# Patient Record
Sex: Female | Born: 1940
Health system: Southern US, Community
[De-identification: ages and names within clinical notes are randomized; demographics above are authoritative.]

## PROBLEM LIST (undated history)

## (undated) DIAGNOSIS — E0590 Thyrotoxicosis, unspecified without thyrotoxic crisis or storm: Secondary | ICD-10-CM

## (undated) DIAGNOSIS — F32A Depression, unspecified: Secondary | ICD-10-CM

## (undated) DIAGNOSIS — I10 Essential (primary) hypertension: Secondary | ICD-10-CM

## (undated) DIAGNOSIS — I639 Cerebral infarction, unspecified: Secondary | ICD-10-CM

## (undated) DIAGNOSIS — F329 Major depressive disorder, single episode, unspecified: Secondary | ICD-10-CM

## (undated) DIAGNOSIS — E119 Type 2 diabetes mellitus without complications: Secondary | ICD-10-CM

## (undated) DIAGNOSIS — K219 Gastro-esophageal reflux disease without esophagitis: Secondary | ICD-10-CM

## (undated) DIAGNOSIS — D649 Anemia, unspecified: Secondary | ICD-10-CM

## (undated) DIAGNOSIS — H269 Unspecified cataract: Secondary | ICD-10-CM

## (undated) DIAGNOSIS — N189 Chronic kidney disease, unspecified: Secondary | ICD-10-CM

## (undated) DIAGNOSIS — J189 Pneumonia, unspecified organism: Secondary | ICD-10-CM

## (undated) HISTORY — PX: CARPAL TUNNEL RELEASE: SHX101

## (undated) HISTORY — DX: Chronic kidney disease, unspecified: N18.9

## (undated) HISTORY — PX: ABDOMINAL HYSTERECTOMY: SHX81

---

## 2000-06-04 ENCOUNTER — Encounter: Payer: Self-pay | Admitting: Family Medicine

## 2000-06-04 ENCOUNTER — Encounter: Admission: RE | Admit: 2000-06-04 | Discharge: 2000-06-04 | Payer: Self-pay | Admitting: Family Medicine

## 2001-08-16 ENCOUNTER — Encounter: Admission: RE | Admit: 2001-08-16 | Discharge: 2001-08-16 | Payer: Self-pay | Admitting: Family Medicine

## 2001-08-16 ENCOUNTER — Encounter: Payer: Self-pay | Admitting: Family Medicine

## 2003-11-18 ENCOUNTER — Encounter: Admission: RE | Admit: 2003-11-18 | Discharge: 2003-11-18 | Payer: Self-pay | Admitting: Family Medicine

## 2004-10-17 ENCOUNTER — Other Ambulatory Visit: Admission: RE | Admit: 2004-10-17 | Discharge: 2004-10-17 | Payer: Self-pay | Admitting: Gynecology

## 2006-04-13 ENCOUNTER — Ambulatory Visit: Payer: Self-pay | Admitting: Internal Medicine

## 2006-04-26 ENCOUNTER — Ambulatory Visit: Payer: Self-pay | Admitting: Internal Medicine

## 2006-05-30 ENCOUNTER — Ambulatory Visit: Payer: Self-pay | Admitting: Internal Medicine

## 2007-07-11 ENCOUNTER — Encounter: Admission: RE | Admit: 2007-07-11 | Discharge: 2007-07-11 | Payer: Self-pay | Admitting: Family Medicine

## 2010-01-24 ENCOUNTER — Telehealth (INDEPENDENT_AMBULATORY_CARE_PROVIDER_SITE_OTHER): Payer: Self-pay | Admitting: *Deleted

## 2011-01-26 NOTE — Progress Notes (Signed)
  Phone Note Other Incoming   Summary of Call: Patient's chart has been requested by patient to Lavone Orn. Forwarded to [employer] on 01/24/10

## 2012-07-10 ENCOUNTER — Other Ambulatory Visit: Payer: Self-pay | Admitting: Internal Medicine

## 2012-07-10 DIAGNOSIS — Z1231 Encounter for screening mammogram for malignant neoplasm of breast: Secondary | ICD-10-CM

## 2012-09-09 ENCOUNTER — Ambulatory Visit
Admission: RE | Admit: 2012-09-09 | Discharge: 2012-09-09 | Disposition: A | Discharge status: Home / Self Care | Payer: Medicare Other | Source: Ambulatory Visit | Attending: Internal Medicine | Admitting: Internal Medicine

## 2012-09-09 DIAGNOSIS — Z1231 Encounter for screening mammogram for malignant neoplasm of breast: Secondary | ICD-10-CM

## 2013-07-30 ENCOUNTER — Other Ambulatory Visit: Payer: Self-pay

## 2013-07-30 DIAGNOSIS — Z1231 Encounter for screening mammogram for malignant neoplasm of breast: Secondary | ICD-10-CM

## 2013-09-11 ENCOUNTER — Ambulatory Visit
Admission: RE | Admit: 2013-09-11 | Discharge: 2013-09-11 | Disposition: A | Discharge status: Home / Self Care | Payer: Medicare Other | Source: Ambulatory Visit

## 2013-09-11 DIAGNOSIS — Z1231 Encounter for screening mammogram for malignant neoplasm of breast: Secondary | ICD-10-CM

## 2014-10-14 ENCOUNTER — Other Ambulatory Visit: Payer: Self-pay | Admitting: Gastroenterology

## 2014-11-02 ENCOUNTER — Encounter (HOSPITAL_COMMUNITY): Payer: Self-pay | Admitting: *Deleted

## 2014-11-03 ENCOUNTER — Other Ambulatory Visit: Payer: Self-pay | Admitting: Gastroenterology

## 2014-11-17 ENCOUNTER — Encounter (HOSPITAL_COMMUNITY): Payer: Self-pay | Admitting: *Deleted

## 2014-11-17 ENCOUNTER — Ambulatory Visit (HOSPITAL_COMMUNITY): Payer: Medicare Other | Admitting: Anesthesiology

## 2014-11-17 ENCOUNTER — Ambulatory Visit (HOSPITAL_COMMUNITY)
Admission: RE | Admit: 2014-11-17 | Discharge: 2014-11-17 | Disposition: A | Discharge status: Home / Self Care | Payer: Medicare Other | Source: Ambulatory Visit | Attending: Gastroenterology | Admitting: Gastroenterology

## 2014-11-17 ENCOUNTER — Encounter (HOSPITAL_COMMUNITY)
Admission: RE | Disposition: A | Discharge status: Home / Self Care | Payer: Self-pay | Source: Ambulatory Visit | Attending: Gastroenterology

## 2014-11-17 DIAGNOSIS — Z1211 Encounter for screening for malignant neoplasm of colon: Secondary | ICD-10-CM | POA: Insufficient documentation

## 2014-11-17 DIAGNOSIS — K5730 Diverticulosis of large intestine without perforation or abscess without bleeding: Secondary | ICD-10-CM | POA: Insufficient documentation

## 2014-11-17 DIAGNOSIS — K219 Gastro-esophageal reflux disease without esophagitis: Secondary | ICD-10-CM | POA: Insufficient documentation

## 2014-11-17 DIAGNOSIS — I10 Essential (primary) hypertension: Secondary | ICD-10-CM | POA: Insufficient documentation

## 2014-11-17 DIAGNOSIS — E1140 Type 2 diabetes mellitus with diabetic neuropathy, unspecified: Secondary | ICD-10-CM | POA: Diagnosis not present

## 2014-11-17 DIAGNOSIS — E780 Pure hypercholesterolemia: Secondary | ICD-10-CM | POA: Insufficient documentation

## 2014-11-17 DIAGNOSIS — F17200 Nicotine dependence, unspecified, uncomplicated: Secondary | ICD-10-CM | POA: Diagnosis not present

## 2014-11-17 DIAGNOSIS — E0590 Thyrotoxicosis, unspecified without thyrotoxic crisis or storm: Secondary | ICD-10-CM | POA: Insufficient documentation

## 2014-11-17 DIAGNOSIS — M8580 Other specified disorders of bone density and structure, unspecified site: Secondary | ICD-10-CM | POA: Diagnosis not present

## 2014-11-17 DIAGNOSIS — E162 Hypoglycemia, unspecified: Secondary | ICD-10-CM | POA: Insufficient documentation

## 2014-11-17 DIAGNOSIS — K635 Polyp of colon: Secondary | ICD-10-CM | POA: Insufficient documentation

## 2014-11-17 HISTORY — DX: Essential (primary) hypertension: I10

## 2014-11-17 HISTORY — DX: Depression, unspecified: F32.A

## 2014-11-17 HISTORY — PX: COLONOSCOPY WITH PROPOFOL: SHX5780

## 2014-11-17 HISTORY — DX: Type 2 diabetes mellitus without complications: E11.9

## 2014-11-17 HISTORY — DX: Major depressive disorder, single episode, unspecified: F32.9

## 2014-11-17 HISTORY — DX: Unspecified cataract: H26.9

## 2014-11-17 LAB — GLUCOSE, CAPILLARY: Glucose-Capillary: 110 mg/dL — ABNORMAL HIGH (ref 70–99)

## 2014-11-17 SURGERY — COLONOSCOPY WITH PROPOFOL
Anesthesia: Monitor Anesthesia Care

## 2014-11-17 MED ORDER — PROPOFOL INFUSION 10 MG/ML OPTIME
INTRAVENOUS | Status: DC | PRN
  Administered 2014-11-17: 100 ug/kg/min via INTRAVENOUS

## 2014-11-17 MED ORDER — LACTATED RINGERS IV SOLN
INTRAVENOUS | Status: DC
  Administered 2014-11-17: 1000 mL via INTRAVENOUS

## 2014-11-17 MED ORDER — PROPOFOL 10 MG/ML IV BOLUS
INTRAVENOUS | Status: AC
  Filled 2014-11-17: qty 20

## 2014-11-17 MED ORDER — PROMETHAZINE HCL 25 MG/ML IJ SOLN: 6.2500 mg | INTRAMUSCULAR | Status: DC | PRN

## 2014-11-17 MED ORDER — SODIUM CHLORIDE 0.9 % IV SOLN: INTRAVENOUS | Status: DC

## 2014-11-17 SURGICAL SUPPLY — 22 items

## 2014-11-17 NOTE — Anesthesia Preprocedure Evaluation (Signed)
Anesthesia Evaluation  Patient identified by MRN, date of birth, ID band Patient awake    Reviewed: Allergy & Precautions, H&P , NPO status , Patient's Chart, lab work & pertinent test results  Airway Mallampati: II  TM Distance: >3 FB Neck ROM: Full    Dental no notable dental hx.    Pulmonary Current Smoker,  breath sounds clear to auscultation  Pulmonary exam normal       Cardiovascular hypertension, Pt. on medications and Pt. on home beta blockers Rhythm:Regular Rate:Normal     Neuro/Psych negative neurological ROS  negative psych ROS   GI/Hepatic negative GI ROS, Neg liver ROS,   Endo/Other  diabetes, Oral Hypoglycemic Agents  Renal/GU negative Renal ROS  negative genitourinary   Musculoskeletal negative musculoskeletal ROS (+)   Abdominal   Peds negative pediatric ROS (+)  Hematology negative hematology ROS (+)   Anesthesia Other Findings   Reproductive/Obstetrics negative OB ROS                             Anesthesia Physical Anesthesia Plan  ASA: III  Anesthesia Plan: MAC   Post-op Pain Management:    Induction: Intravenous  Airway Management Planned: Nasal Cannula  Additional Equipment:   Intra-op Plan:   Post-operative Plan:   Informed Consent: I have reviewed the patients History and Physical, chart, labs and discussed the procedure including the risks, benefits and alternatives for the proposed anesthesia with the patient or authorized representative who has indicated his/her understanding and acceptance.   Dental advisory given  Plan Discussed with: CRNA and Surgeon  Anesthesia Plan Comments:         Anesthesia Quick Evaluation

## 2014-11-17 NOTE — Op Note (Signed)
Procedure: Screening colonoscopy  Endoscopist: Earle Gell  Premedication: Propofol administered by anesthesia  Procedure: The patient was placed in the left lateral decubitus position. Anal inspection and digital rectal exam were normal. The Pentax pediatric colonoscope was introduced into the rectum and advanced to the cecum. A normal-appearing appendiceal orifice was identified. A normal-appearing ileocecal valve was intubated and the terminal ileum inspected. Colonic preparation for the exam today was good. Withdrawal time was 12 minutes.  Rectum. Normal. Retroflexed view of the distal rectum normal  Sigmoid colon and descending colon. Scattered small diverticula. From the distal sigmoid colon a 3 mm sessile polyp was removed with the cold biopsy forceps  Splenic flexure. Normal  Transverse colon. Normal  Hepatic flexure. Normal  Ascending colon. Normal  Cecum and ileocecal valve. Normal  Terminal ileum. Normal  Assessment: A diminutive polyp was removed from the distal sigmoid colon; otherwise normal colonoscopy.

## 2014-11-17 NOTE — H&P (Signed)
  Procedure: Screening colonoscopy  History: The patient is a 73 year old female born [birth year]. She is scheduled to undergo a repeat screening colonoscopy with polypectomy to prevent colon cancer.  Past medical history: Abdominal hysterectomy. Right carpal tunnel surgery. Type 2 diabetes mellitus. Diabetic neuropathy. Hypertension. Hypercholesterolemia. Osteopenia. Hyperthyroidism. Gastroesophageal reflux.  Medication allergies: None  Exam: The patient is alert and lying comfortably on the endoscopy stretcher. Lungs are clear to auscultation. Cardiac exam reveals a regular rhythm. Abdomen is soft and nontender to palpation.  Plan: Proceed with screening colonoscopy

## 2014-11-17 NOTE — Transfer of Care (Signed)
Immediate Anesthesia Transfer of Care Note  Patient: Monica Hunt  Procedure(s) Performed: Procedure(s): COLONOSCOPY WITH PROPOFOL (N/A)  Patient Location: PACU  Anesthesia Type:MAC  Level of Consciousness: sedated  Airway & Oxygen Therapy: Patient Spontanous Breathing and Patient connected to nasal cannula oxygen  Post-op Assessment: Report given to PACU RN and Post -op Vital signs reviewed and stable  Post vital signs: Reviewed and stable  Complications: No apparent anesthesia complications

## 2014-11-17 NOTE — Anesthesia Postprocedure Evaluation (Signed)
  Anesthesia Post-op Note  Patient: Monica Hunt  Procedure(s) Performed: Procedure(s) (LRB): COLONOSCOPY WITH PROPOFOL (N/A)  Patient Location: PACU  Anesthesia Type: MAC  Level of Consciousness: awake and alert   Airway and Oxygen Therapy: Patient Spontanous Breathing  Post-op Pain: mild  Post-op Assessment: Post-op Vital signs reviewed, Patient's Cardiovascular Status Stable, Respiratory Function Stable, Patent Airway and No signs of Nausea or vomiting  Last Vitals:  Filed Vitals:   11/17/14 1053  BP: 118/61  Pulse: 74  Temp: 36.7 C  Resp: 18    Post-op Vital Signs: stable   Complications: No apparent anesthesia complications

## 2014-11-18 ENCOUNTER — Encounter (HOSPITAL_COMMUNITY): Payer: Self-pay | Admitting: Gastroenterology

## 2015-02-23 ENCOUNTER — Other Ambulatory Visit: Payer: Self-pay

## 2015-02-23 DIAGNOSIS — Z1231 Encounter for screening mammogram for malignant neoplasm of breast: Secondary | ICD-10-CM

## 2015-03-22 ENCOUNTER — Ambulatory Visit
Admission: RE | Admit: 2015-03-22 | Discharge: 2015-03-22 | Disposition: A | Discharge status: Home / Self Care | Payer: Medicare Other | Source: Ambulatory Visit

## 2015-03-22 ENCOUNTER — Other Ambulatory Visit: Payer: Self-pay

## 2015-03-22 DIAGNOSIS — Z1231 Encounter for screening mammogram for malignant neoplasm of breast: Secondary | ICD-10-CM

## 2016-04-19 ENCOUNTER — Other Ambulatory Visit: Payer: Self-pay

## 2016-04-19 DIAGNOSIS — Z1231 Encounter for screening mammogram for malignant neoplasm of breast: Secondary | ICD-10-CM

## 2016-04-25 ENCOUNTER — Ambulatory Visit
Admission: RE | Admit: 2016-04-25 | Discharge: 2016-04-25 | Disposition: A | Discharge status: Home / Self Care | Payer: Medicare Other | Source: Ambulatory Visit

## 2016-04-25 DIAGNOSIS — Z1231 Encounter for screening mammogram for malignant neoplasm of breast: Secondary | ICD-10-CM

## 2016-05-01 ENCOUNTER — Other Ambulatory Visit: Payer: Self-pay | Admitting: Internal Medicine

## 2016-05-01 DIAGNOSIS — N183 Chronic kidney disease, stage 3 unspecified: Secondary | ICD-10-CM

## 2016-05-02 ENCOUNTER — Ambulatory Visit
Admission: RE | Admit: 2016-05-02 | Discharge: 2016-05-02 | Disposition: A | Discharge status: Home / Self Care | Payer: Medicare Other | Source: Ambulatory Visit | Attending: Internal Medicine | Admitting: Internal Medicine

## 2016-05-02 DIAGNOSIS — N183 Chronic kidney disease, stage 3 unspecified: Secondary | ICD-10-CM

## 2016-05-02 IMAGING — US US RENAL
1 series · 14 of 25 positions shown · non-contrast
Comparison: None.

CLINICAL DATA: Chronic kidney disease stage III

EXAM:
RENAL / URINARY TRACT ULTRASOUND COMPLETE

[Series 1: us renal · 0.26mm/px · 14 of 34 slices shown]
[im 1/34]
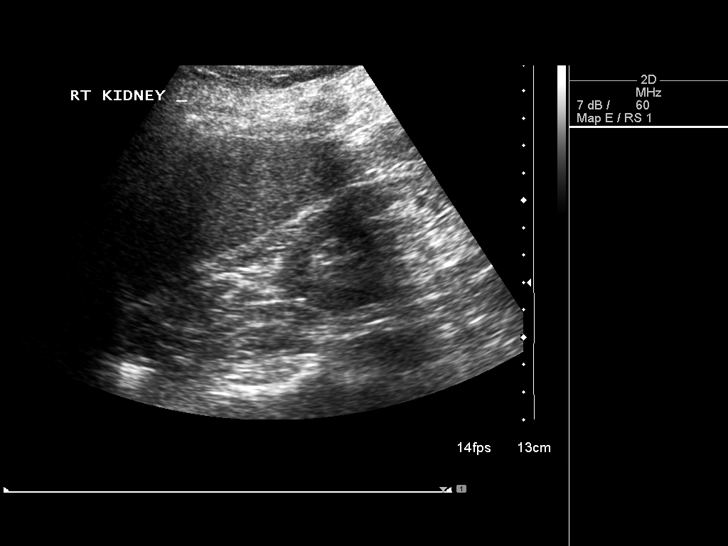
[im 3/34]
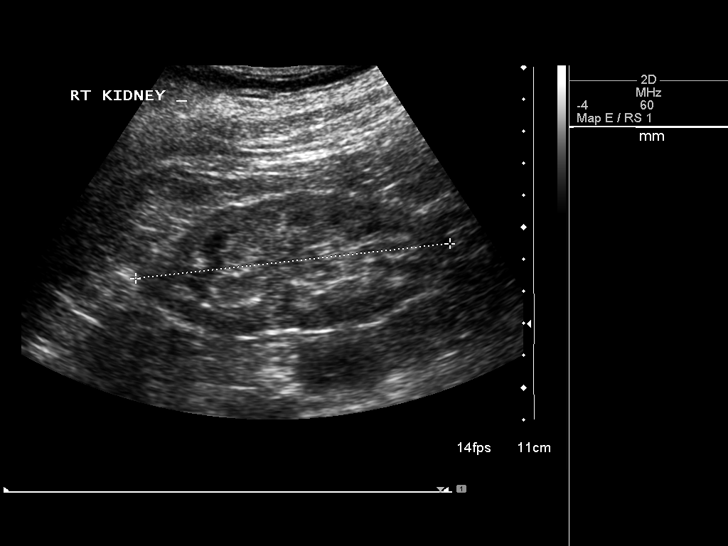
[im 6/34]
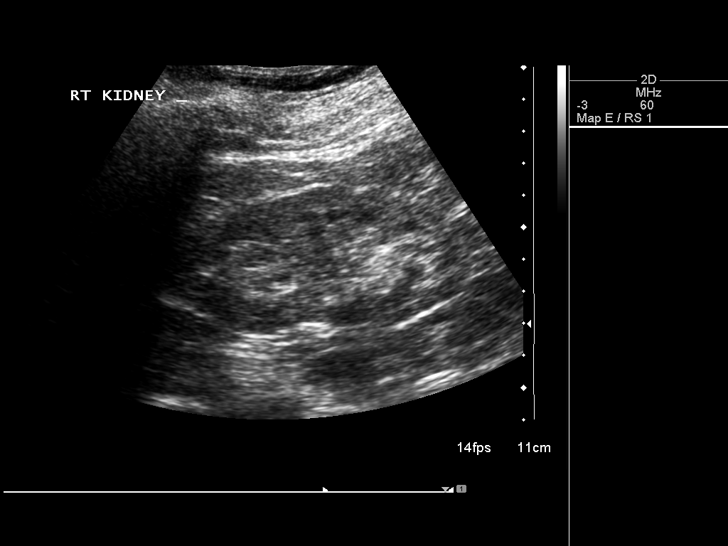
[im 9/34]
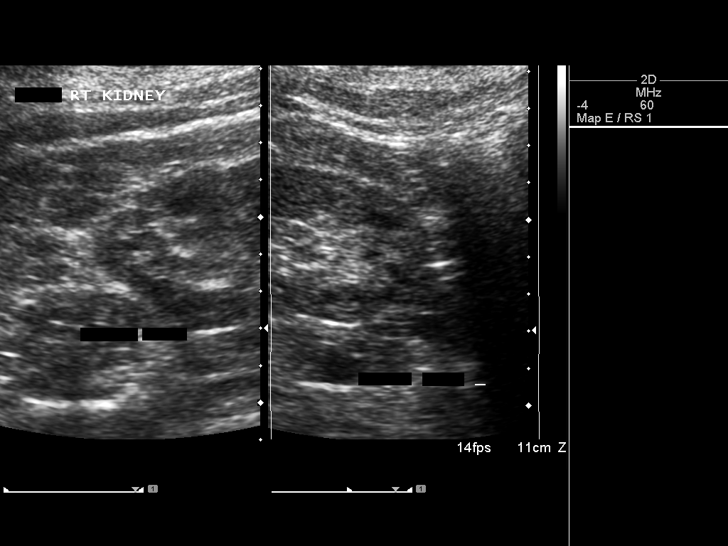
[im 12/34]
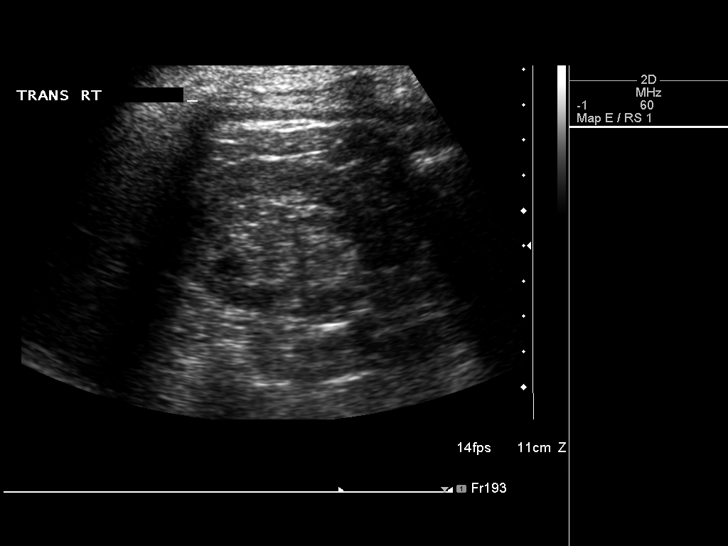
[im 13/34]
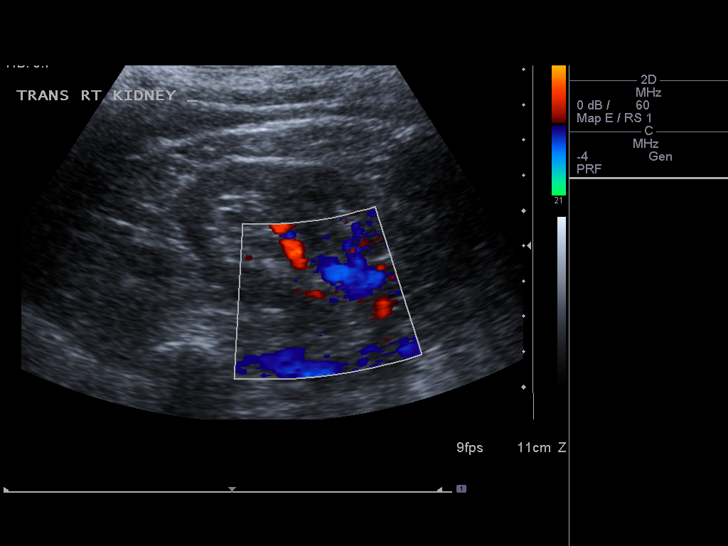
[im 16/34]
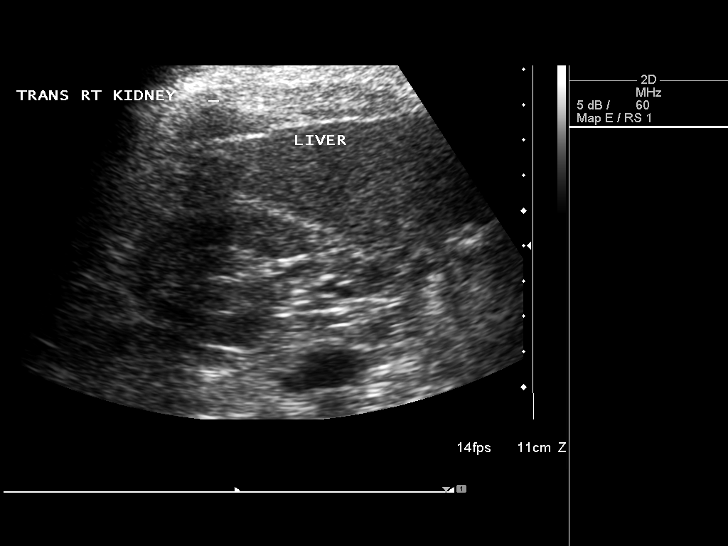
[im 18/34]
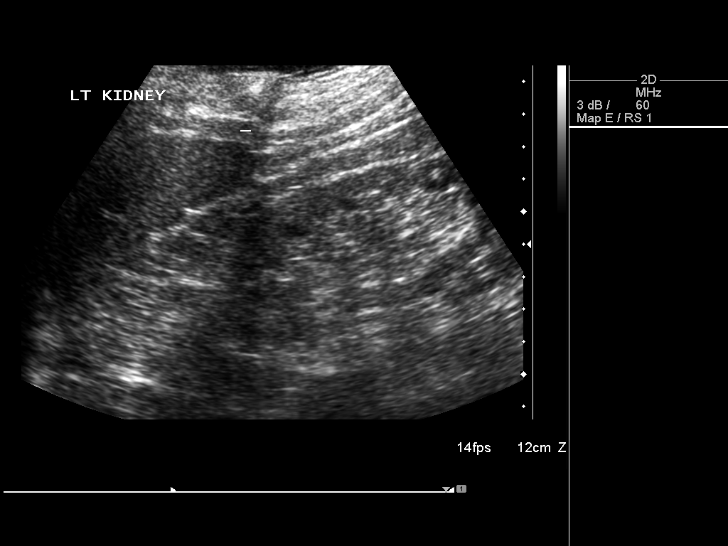
[im 21/34]
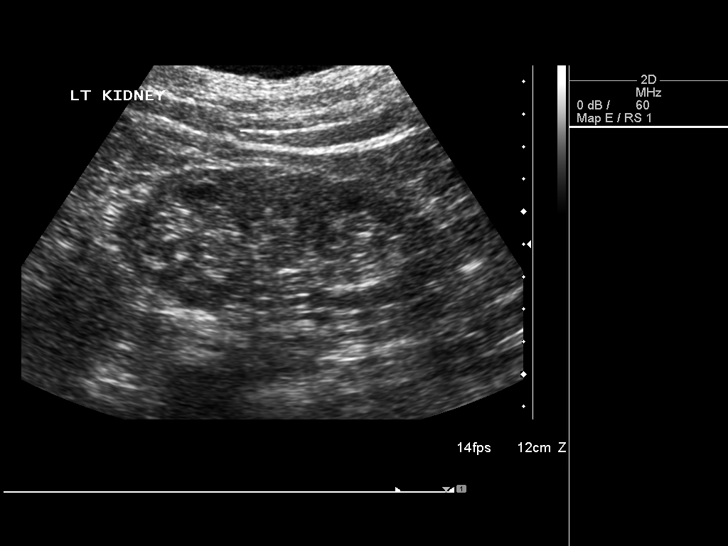
[im 23/34]
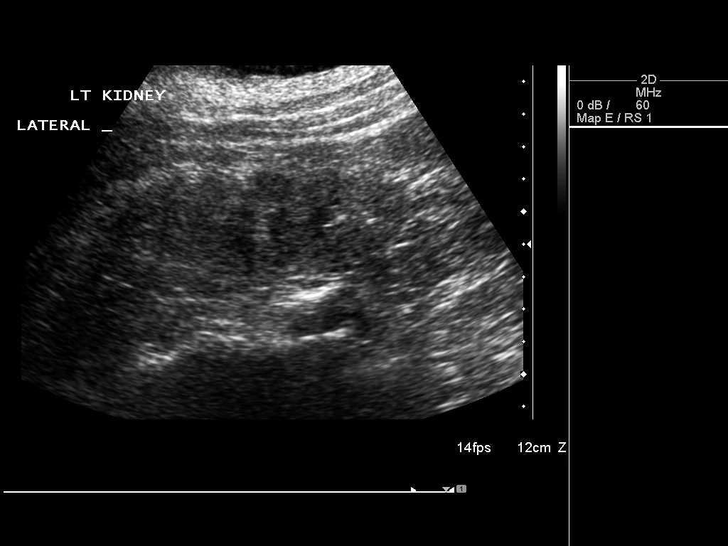
[im 25/34]
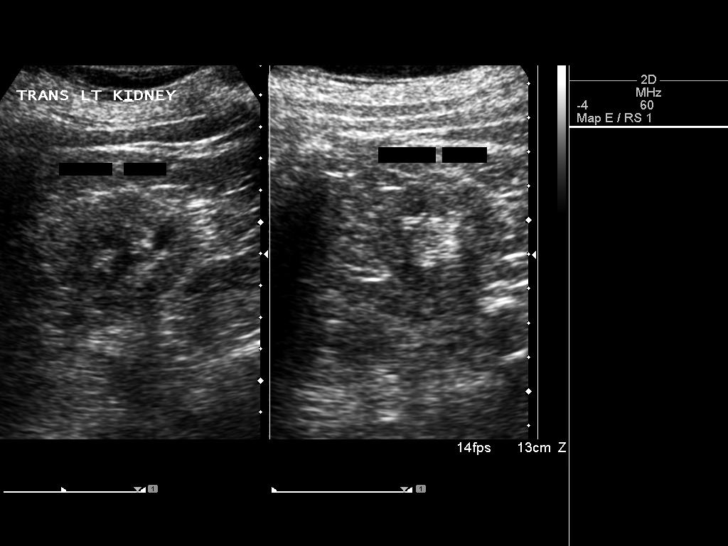
[im 28/34]
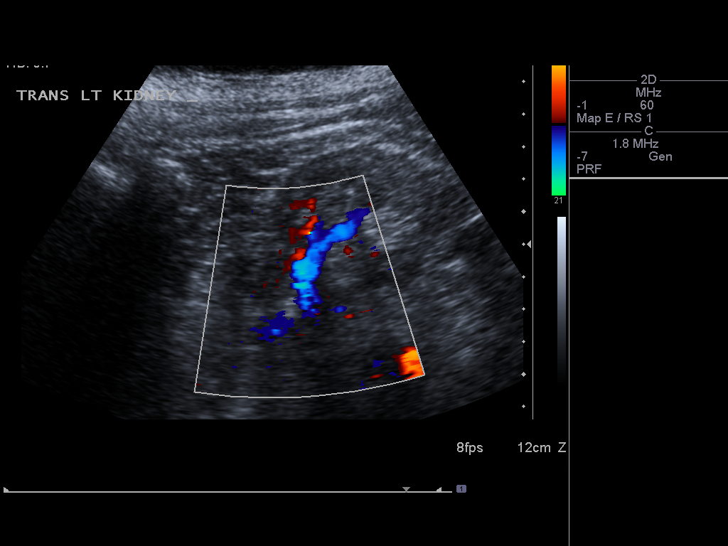
[im 31/34]
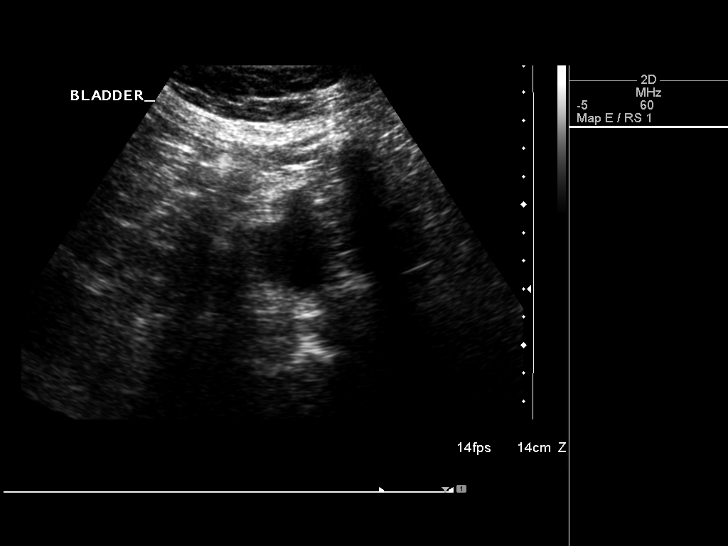
[im 34/34]
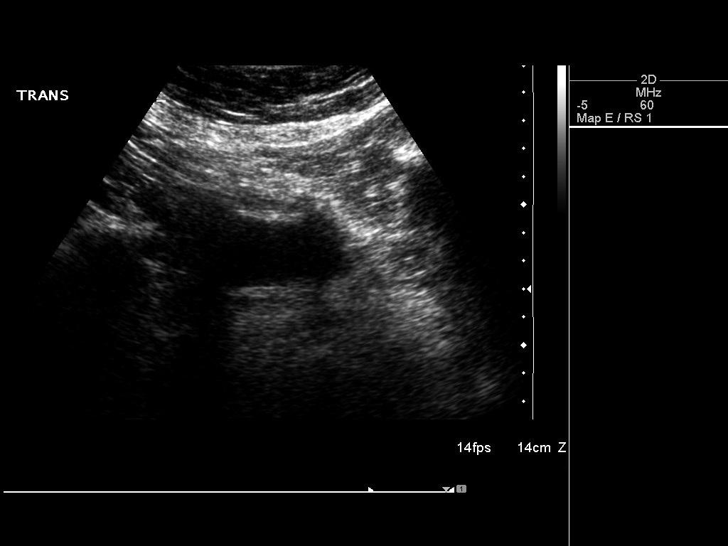

[14 of 25 positions shown; findings below may reference images not displayed]

FINDINGS: Right Kidney:

Length: 9.9 cm. Mild cortical thinning. Mildly increased
echotexture. No hydronephrosis

Left Kidney:

Length: 10.1 cm. Mild cortical thinning and increased echotexture.
No hydronephrosis.

Bladder:

Appears normal for degree of bladder distention.
IMPRESSION: Mild cortical thinning bilaterally with slight increased
echotexture. No hydronephrosis.

## 2017-02-20 ENCOUNTER — Other Ambulatory Visit: Payer: Self-pay | Admitting: Internal Medicine

## 2017-02-20 DIAGNOSIS — Z1231 Encounter for screening mammogram for malignant neoplasm of breast: Secondary | ICD-10-CM

## 2017-05-07 ENCOUNTER — Ambulatory Visit: Payer: Medicare Other

## 2017-05-15 ENCOUNTER — Telehealth: Payer: Self-pay | Admitting: Cardiovascular Disease

## 2017-05-15 NOTE — Telephone Encounter (Signed)
Received records from [place] for appointment on 05/17/17 with Dr Sallyanne Kuster.  Records put with Dr Croitoru's schedule for 05/17/17. lp

## 2017-05-17 ENCOUNTER — Ambulatory Visit (INDEPENDENT_AMBULATORY_CARE_PROVIDER_SITE_OTHER): Payer: Medicare Other | Admitting: Cardiovascular Disease

## 2017-05-17 ENCOUNTER — Encounter: Payer: Self-pay | Admitting: Cardiovascular Disease

## 2017-05-17 ENCOUNTER — Encounter (INDEPENDENT_AMBULATORY_CARE_PROVIDER_SITE_OTHER): Payer: Self-pay

## 2017-05-17 VITALS — BP 124/68 | HR 62 | Ht 62.5 in | Wt 157.0 lb

## 2017-05-17 DIAGNOSIS — R55 Syncope and collapse: Secondary | ICD-10-CM | POA: Diagnosis not present

## 2017-05-17 DIAGNOSIS — E1122 Type 2 diabetes mellitus with diabetic chronic kidney disease: Secondary | ICD-10-CM | POA: Diagnosis not present

## 2017-05-17 DIAGNOSIS — I10 Essential (primary) hypertension: Secondary | ICD-10-CM | POA: Diagnosis not present

## 2017-05-17 DIAGNOSIS — N184 Chronic kidney disease, stage 4 (severe): Secondary | ICD-10-CM

## 2017-05-17 NOTE — Progress Notes (Addendum)
Cardiology Consultation Note:    Date:  05/18/2017   ID:  Monica Hunt, DOB 07/29/1941, MRN [id number]  PCP:  Lavone Orn, MD  Cardiologist:  Sanda Klein, MD    Referring MD: Lavone Orn, MD   Chief Complaint  Patient presents with  . Follow-up    New patient.  . Loss of Consciousness  . Fatigue    Monica Hunt is a 76 y.o. female who is being seen today for the evaluation of recurrent syncope at the request of Lavone Orn, MD.   History of Present Illness:    Monica Hunt is a 76 y.o. female with a hx of Diabetes mellitus, chronic kidney disease (Dr. Mercy Moore), hypertension, hyperthyroidism. She has had "Irritable bowel syndrome" for several years.   She has had recurrent episodes of syncope that always occur when she is sitting on the commode having diarrhea. These episodes have occurred sporadically over the last few years but recently have been increasing in frequency, occurring 3 times in the last 6 months. The most recent episode occurred roughly 3 or 4 weeks ago. The episodes are fairly stereotypical. She'll be having a painful runny bowel movement and she becomes hot, flushed breaks out in a cold sweat and then loses consciousness. None of the episodes have been witnessed and she is not sure how long she's been out, but she believes the episodes are very brief. She has fallen but has not had injury.  She denies exertional chest pain and dyspnea. She is fairly sedentary but can do the housework without a lot of complaints. Denies leg edema, claudication, focal neurological episodes except as described above. She denies palpitations.  Additional comorbidities include diabetes mellitus with polyneuropathy and chronic kidney disease stage III (IV?) as well as hypertension, restless leg syndrome, acid reflux. She is on methimazole for hyperthyroidism with a normal TSH very recently.  She is very confused about her medications. But she believes that the records from Dr.  Delene Ruffini office are accurate. Those records list her antihypertensives as amlodipine 5 mg daily and metoprolol succinate 50 mg daily and losartan 50 mg twice daily. She also takes simvastatin 20 mg daily for hyperlipidemia as well as trazodone, Requip, omeprazole and methimazole. She tells me that the losartan is currently on hold because of her kidney function abnormalities  Past Medical History:  Diagnosis Date  . Cataracts, bilateral   . Depression   . Diabetes mellitus without complication ([place])   . Hypertension     Past Surgical History:  Procedure Laterality Date  . ABDOMINAL HYSTERECTOMY     vaginal  . CARPAL TUNNEL RELEASE Right   . COLONOSCOPY WITH PROPOFOL N/A 11/17/2014   Procedure: COLONOSCOPY WITH PROPOFOL;  Surgeon: Garlan Fair, MD;  Location: [facility];  Service: Endoscopy;  Laterality: N/A;    Current Medications: Current Meds  Medication Sig  . Calcium Carbonate-Vitamin D (CALCIUM + D PO) Take 1 tablet by mouth daily.  . cholecalciferol (VITAMIN D) 1000 UNITS tablet Take 1,000 Units by mouth daily.  . metoprolol (LOPRESSOR) 50 MG tablet Take 50 mg by mouth every morning.   . Multiple Vitamin (MULTIVITAMIN WITH MINERALS) TABS tablet Take 1 tablet by mouth daily.  . Omega 3 1000 MG CAPS Take 2 capsules by mouth 2 (two) times daily.  [name] omeprazole (PRILOSEC) 40 MG capsule Take 40 mg by mouth daily.  . simvastatin (ZOCOR) 40 MG tablet Take 40 mg by mouth every evening.   . [DISCONTINUED] gabapentin (NEURONTIN) 300  MG capsule Take 300 mg by mouth 3 (three) times daily.   . [DISCONTINUED] zolpidem (AMBIEN) 10 MG tablet Take 10 mg by mouth at bedtime as needed for sleep.      Allergies:   Patient has no known allergies.   Social History   Social History  . Marital status: Divorced    Spouse name: N/A  . Number of children: N/A  . Years of education: N/A   Social History Main Topics  . Smoking status: Current Every Day Smoker    Packs/day: 0.50    Years:  45.00  . Smokeless tobacco: Never Used  . Alcohol use No  . Drug use: No  . Sexual activity: Not Asked   Other Topics Concern  . None   Social History Narrative  . None     Family History: The patient's Family history significant for lung cancer in her father who died when he was only 59 years old, renal cancer in her mother who died at age 73. She has a brother who died at age 10 from kidney failure. Her daughter also has hyperthyroidism. ROS:   Please see the history of present illness.     All other systems reviewed and are negative.  EKGs/Labs/Other Studies Reviewed:    The following studies were reviewed today: Notes and labs from Dr. Delene Ruffini office  EKG:  EKG is  ordered today.  The ekg ordered today demonstrates normal sinus rhythm, normal tracing, QTC 430 ms  Recent Labs: 05/11/2017 Normal TSH and free T4, creatinine 2.37, BUN 41 (estimated GFR 20 mL/minute), glucose 117, normal electrolytes and liver function tests, hemoglobin 12.6, hemoglobin A1c 6.1% Total cholesterol 166 Triglycerides 363, HDL 33, LDL 61  Physical Exam:    VS:  BP 124/68   Pulse 62   Ht 5' 2.5" (1.588 m)   Wt 157 lb (71.2 kg)   BMI 28.26 kg/m     Wt Readings from Last 3 Encounters:  05/17/17 157 lb (71.2 kg)  11/17/14 150 lb (68 kg)     GEN:  Well nourished, well developed in no acute distress HEENT: Normal NECK: No JVD; No carotid bruits LYMPHATICS: No lymphadenopathy CARDIAC: RRR, Faint systolic murmur in the aortic focus, no diastolic murmurs, rubs, gallops RESPIRATORY:  Clear to auscultation without rales, wheezing or rhonchi  ABDOMEN: Soft, non-tender, non-distended MUSCULOSKELETAL:  No edema; No deformity  SKIN: Warm and dry NEUROLOGIC:  Alert and oriented x 3 PSYCHIATRIC:  Normal affect   ASSESSMENT:    1. Syncope, unspecified syncope type    PLAN:    In order of problems listed above:  1. Syncope: For the most part Mrs. Machnik has events that are highly compatible  with neurally mediated syncope. They consistently occurred during a bowel movement and are preceded by a sensation of flushing heat and cold sweats, seemed to be brief and have not been associated with serious injury. On the other hand it is unusual to develop neurally mediated syncope with high frequency at an advanced age. It's possible that the development of diabetic neuropathy may be predisposing her to hypotensive response. May need to liberalize her sodium intake to avoid syncope, but this should be done cautiously since she has kidney disease and hypertension I would recommend a 30 day event monitor make sure that there is not an arrhythmic cause for syncope. I also recommended an echocardiogram to make sure there is no underlying structural heart disease since she has a heart murmur and since she has  so many risk factors for cardiac illness.. 2. DM: At risk for hypovolemia/dehydration if her glucose is out of control, but most recent A1c was excellent at 6.1%. 3. CKD 4:  currently not on any diuretics. Losartan is on hold for acute worsening of renal function per her report.  4. HTN: BP is great even off the losartan  ADDENDUM: After she left the office she called back to say that she is taking furosemide 40 mg once daily. Excessive diuresis may predispose her to increase frequency of syncope.   Medication Adjustments/Labs and Tests Ordered: Current medicines are reviewed at length with the patient today.  Concerns regarding medicines are outlined above. Labs and tests ordered and medication changes are outlined in the patient instructions below:  Patient Instructions  Medication Instructions: Dr Sallyanne Kuster recommends that you continue on your current medications as directed. Please refer to the Current Medication list given to you today.  Labwork: NONE ORDERED  Testing/Procedures: 1. Echocardiogram - Your physician has requested that you have an echocardiogram. Echocardiography is a  painless test that uses sound waves to create images of your heart. It provides your doctor with information about the size and shape of your heart and how well your heart's chambers and valves are working. This procedure takes approximately one hour. There are no restrictions for this procedure.  2. 30-Day Cardiac Event Monitor - Your physician has recommended that you wear an event monitor. Event monitors are medical devices that record the heart's electrical activity. Doctors most often [place] these monitors to diagnose arrhythmias. Arrhythmias are problems with the speed or rhythm of the heartbeat. The monitor is a small, portable device. You can wear one while you do your normal daily activities. This is usually used to diagnose what is causing palpitations/syncope (passing out).  These tests have been ordered to be performed at our [facility] location - [address].  Follow-up: Dr Sallyanne Kuster recommends that you schedule a follow-up appointment after testing is completed.  If you need a refill on your cardiac medications before your next appointment, please call your pharmacy.    Signed, Sanda Klein, MD  05/18/2017 12:59 PM    [place]

## 2017-05-17 NOTE — Patient Instructions (Signed)
Medication Instructions: Dr Sallyanne Kuster recommends that you continue on your current medications as directed. Please refer to the Current Medication list given to you today.  Labwork: NONE ORDERED  Testing/Procedures: 1. Echocardiogram - Your physician has requested that you have an echocardiogram. Echocardiography is a painless test that uses sound waves to create images of your heart. It provides your doctor with information about the size and shape of your heart and how well your heart's chambers and valves are working. This procedure takes approximately one hour. There are no restrictions for this procedure.  2. 30-Day Cardiac Event Monitor - Your physician has recommended that you wear an event monitor. Event monitors are medical devices that record the heart's electrical activity. Doctors most often [place] these monitors to diagnose arrhythmias. Arrhythmias are problems with the speed or rhythm of the heartbeat. The monitor is a small, portable device. You can wear one while you do your normal daily activities. This is usually used to diagnose what is causing palpitations/syncope (passing out).  These tests have been ordered to be performed at our [facility] location - [address].  Follow-up: Dr Sallyanne Kuster recommends that you schedule a follow-up appointment after testing is completed.  If you need a refill on your cardiac medications before your next appointment, please call your pharmacy.

## 2017-05-18 ENCOUNTER — Telehealth: Payer: Self-pay | Admitting: Cardiovascular Disease

## 2017-05-18 DIAGNOSIS — N184 Chronic kidney disease, stage 4 (severe): Secondary | ICD-10-CM | POA: Insufficient documentation

## 2017-05-18 DIAGNOSIS — R55 Syncope and collapse: Secondary | ICD-10-CM | POA: Insufficient documentation

## 2017-05-18 DIAGNOSIS — I10 Essential (primary) hypertension: Secondary | ICD-10-CM | POA: Insufficient documentation

## 2017-05-18 DIAGNOSIS — E1129 Type 2 diabetes mellitus with other diabetic kidney complication: Secondary | ICD-10-CM | POA: Insufficient documentation

## 2017-05-18 NOTE — Telephone Encounter (Signed)
Returned call to patient to clarify frequency. She is taking lasix 40mg  daily. Med list updated.   Routed to MD

## 2017-05-18 NOTE — Telephone Encounter (Signed)
New message     Pt is taking furosemide 40mg  for fluid -she was suppose to call and let Dr C know what she was taking

## 2017-05-23 ENCOUNTER — Ambulatory Visit
Admission: RE | Admit: 2017-05-23 | Discharge: 2017-05-23 | Disposition: A | Discharge status: Home / Self Care | Payer: Medicare Other | Source: Ambulatory Visit | Attending: Internal Medicine | Admitting: Internal Medicine

## 2017-05-23 DIAGNOSIS — Z1231 Encounter for screening mammogram for malignant neoplasm of breast: Secondary | ICD-10-CM

## 2017-05-31 ENCOUNTER — Telehealth: Payer: Self-pay | Admitting: *Deleted

## 2017-05-31 ENCOUNTER — Other Ambulatory Visit: Payer: Self-pay

## 2017-05-31 ENCOUNTER — Ambulatory Visit (INDEPENDENT_AMBULATORY_CARE_PROVIDER_SITE_OTHER): Payer: Medicare Other

## 2017-05-31 ENCOUNTER — Ambulatory Visit (HOSPITAL_COMMUNITY): Payer: Medicare Other | Attending: Cardiology

## 2017-05-31 DIAGNOSIS — R55 Syncope and collapse: Secondary | ICD-10-CM | POA: Insufficient documentation

## 2017-05-31 DIAGNOSIS — I081 Rheumatic disorders of both mitral and tricuspid valves: Secondary | ICD-10-CM | POA: Diagnosis not present

## 2017-05-31 DIAGNOSIS — I371 Nonrheumatic pulmonary valve insufficiency: Secondary | ICD-10-CM | POA: Diagnosis not present

## 2017-05-31 DIAGNOSIS — E1122 Type 2 diabetes mellitus with diabetic chronic kidney disease: Secondary | ICD-10-CM | POA: Insufficient documentation

## 2017-05-31 DIAGNOSIS — I129 Hypertensive chronic kidney disease with stage 1 through stage 4 chronic kidney disease, or unspecified chronic kidney disease: Secondary | ICD-10-CM | POA: Insufficient documentation

## 2017-05-31 DIAGNOSIS — N189 Chronic kidney disease, unspecified: Secondary | ICD-10-CM | POA: Diagnosis not present

## 2017-05-31 NOTE — Telephone Encounter (Signed)
Called patient to let her know I had contacted [employer] to ship another Verite monitor  to her home to replace the monitor that was applied to her today.  The monitor should make an audible alert if something is wrong with the monitoring process.  Today, when I disconnected a lead, it did not make any noise.  [employer] will call her to confirm a shipping address.  The monitor she is wearing now is still monitoring/ recording, however, she would not be alerted if one of her electrodes became disconnected.  Patient was appreciative of call.

## 2017-07-04 ENCOUNTER — Telehealth: Payer: Self-pay | Admitting: Cardiovascular Disease

## 2017-07-04 MED ORDER — METOPROLOL SUCCINATE ER 25 MG PO TB24: 25.0000 mg | ORAL_TABLET | Freq: Every day | ORAL | 3 refills | Status: DC

## 2017-07-04 NOTE — Telephone Encounter (Signed)
-----   Message from Sanda Klein, MD sent at 07/03/2017 12:15 PM EDT ----- No serious rhythm problems on the monitor. However heart rate is frequently slow especially in the first part of the day, around 10-11 AM, probably following administration of metoprolol. Slows heart rate recorded was 44 bpm. Please ask her to switch from metoprolol tartrate to metoprolol succinate and reduce the dose to 25 mg once daily (alternatively, can take metoprolol tartrate 12.5 mg twice daily).

## 2017-07-04 NOTE — Telephone Encounter (Signed)
Called patient with results. Patient verbalized understanding and agreed with plan. Rx for Metoprolol Succinate 25 mg PO QD sent to patient's preferred pharmacy electronically.

## 2017-07-04 NOTE — Telephone Encounter (Signed)
New message    Pt is calling about results.

## 2017-07-17 ENCOUNTER — Encounter: Payer: Self-pay | Admitting: Cardiovascular Disease

## 2017-07-17 ENCOUNTER — Ambulatory Visit (INDEPENDENT_AMBULATORY_CARE_PROVIDER_SITE_OTHER): Payer: Medicare Other | Admitting: Cardiovascular Disease

## 2017-07-17 VITALS — BP 130/70 | HR 64 | Ht 62.5 in | Wt 158.0 lb

## 2017-07-17 DIAGNOSIS — I10 Essential (primary) hypertension: Secondary | ICD-10-CM

## 2017-07-17 DIAGNOSIS — N184 Chronic kidney disease, stage 4 (severe): Secondary | ICD-10-CM

## 2017-07-17 DIAGNOSIS — R55 Syncope and collapse: Secondary | ICD-10-CM | POA: Diagnosis not present

## 2017-07-17 DIAGNOSIS — E1122 Type 2 diabetes mellitus with diabetic chronic kidney disease: Secondary | ICD-10-CM

## 2017-07-17 NOTE — Patient Instructions (Signed)
Medication Instructions:  Your physician recommends that you continue on your current medications as directed. Please refer to the Current Medication list given to you today.  Labwork: None   Testing/Procedures: None   Follow-Up: Your physician wants you to follow-up in: 64 MONTHS with DR CROITORU. You will receive a reminder letter in the mail two months in advance. If you don't receive a letter, please call our office to schedule the follow-up appointment.   Any Other Special Instructions Will Be Listed Below (If Applicable).     If you need a refill on your cardiac medications before your next appointment, please call your pharmacy.

## 2017-07-17 NOTE — Progress Notes (Signed)
Cardiology Consultation Note:    Date:  07/17/2017   ID:  Monica Hunt, DOB 1941-04-01, MRN [id number]  PCP:  Lavone Orn, MD  Cardiologist:  Sanda Klein, MD    Referring MD: Lavone Orn, MD   Chief Complaint  Patient presents with  . Follow-up    follow up from ECHO, no chest pain    Monica Hunt is a 76 y.o. female who is being seen today for the evaluation of recurrent syncope at the request of Lavone Orn, MD.   History of Present Illness:    Monica Hunt is a 76 y.o. female with a hx of Diabetes mellitus, chronic kidney disease (Dr. Mercy Moore), hypertension, hyperthyroidism. She has had "Irritable bowel syndrome" for several years.   Her rhythm monitor showed persistent bradycardia. After reducing her beta blocker dose, she has no had any new syncope events. BP remains well controlled.  The patient specifically denies any chest pain at rest exertion, dyspnea at rest or with exertion, orthopnea, paroxysmal nocturnal dyspnea, syncope, palpitations, focal neurological deficits, intermittent claudication, lower extremity edema, unexplained weight gain, cough, hemoptysis or wheezing.  Additional comorbidities include diabetes mellitus with polyneuropathy and chronic kidney disease stage III (IV?) as well as hypertension, restless leg syndrome, acid reflux. She is on methimazole for hyperthyroidism with a normal TSH very recently.    Past Medical History:  Diagnosis Date  . Cataracts, bilateral   . Depression   . Diabetes mellitus without complication ([place])   . Hypertension     Past Surgical History:  Procedure Laterality Date  . ABDOMINAL HYSTERECTOMY     vaginal  . CARPAL TUNNEL RELEASE Right   . COLONOSCOPY WITH PROPOFOL N/A 11/17/2014   Procedure: COLONOSCOPY WITH PROPOFOL;  Surgeon: Garlan Fair, MD;  Location: [facility];  Service: Endoscopy;  Laterality: N/A;    Current Medications: Current Meds  Medication Sig  . amLODipine (NORVASC) 5 MG  tablet Take 5 mg by mouth daily.  . Calcium Carb-Cholecalciferol (CALCIUM 500+D PO) Take 1 tablet by mouth daily.  . Calcium Carbonate-Vitamin D (CALCIUM + D PO) Take 1 tablet by mouth daily.  . cholecalciferol (VITAMIN D) 1000 UNITS tablet Take 1,000 Units by mouth daily.  . clonazePAM (KLONOPIN) 0.5 MG tablet Take 0.5 mg by mouth 2 (two) times daily as needed for anxiety.  [name] EPINEPHrine (EPIPEN 2-PAK) 0.3 mg/0.3 mL IJ SOAJ injection Inject 0.3 mg into the muscle once.  . furosemide (LASIX) 40 MG tablet Take 40 mg by mouth daily.  . methimazole (TAPAZOLE) 5 MG tablet Take 5 mg by mouth daily. Take 1 and 1/2 tab alternating once a day  . metoprolol succinate (TOPROL-XL) 50 MG 24 hr tablet Take 50 mg by mouth daily. Take with or immediately following a meal.  . Multiple Vitamin (MULTIVITAMIN WITH MINERALS) TABS tablet Take 1 tablet by mouth daily.  . Omega 3 1000 MG CAPS Take 2 capsules by mouth 2 (two) times daily.  [name] omeprazole (PRILOSEC) 40 MG capsule Take 40 mg by mouth daily.  . promethazine (PHENERGAN) 25 MG tablet Take 25 mg by mouth every 6 (six) hours as needed for nausea or vomiting.  [name] rOPINIRole (REQUIP) 3 MG tablet Take 3 mg by mouth at bedtime. Take 1 tab 1 to 3 hours before bedtime  . simvastatin (ZOCOR) 20 MG tablet Take 20 mg by mouth every evening.   . traZODone (DESYREL) 50 MG tablet 50 mg. Take 1-2 tabs daily at bedtime     Allergies:  Patient has no known allergies.   Social History   Social History  . Marital status: Divorced    Spouse name: N/A  . Number of children: N/A  . Years of education: N/A   Social History Main Topics  . Smoking status: Current Every Day Smoker    Packs/day: 0.50    Years: 45.00  . Smokeless tobacco: Never Used  . Alcohol use No  . Drug use: No  . Sexual activity: Not Asked   Other Topics Concern  . None   Social History Narrative  . None     Family History: The patient's Family history significant for lung cancer in her  father who died when he was only 31 years old, renal cancer in her mother who died at age 75. She has a brother who died at age 67 from kidney failure. Her daughter also has hyperthyroidism. ROS:   Please see the history of present illness.     All other systems reviewed and are negative.  EKGs/Labs/Other Studies Reviewed:     EKG:  EKG is not ordered today.    Recent Labs: 05/11/2017 Normal TSH and free T4, creatinine 2.37, BUN 41 (estimated GFR 20 mL/minute), glucose 117, normal electrolytes and liver function tests, hemoglobin 12.6, hemoglobin A1c 6.1% Total cholesterol 166 Triglycerides 363, HDL 33, LDL 61  Physical Exam:    VS:  BP 130/70   Pulse 64   Ht 5' 2.5" (1.588 m)   Wt 158 lb (71.7 kg)   BMI 28.44 kg/m     Wt Readings from Last 3 Encounters:  07/17/17 158 lb (71.7 kg)  05/17/17 157 lb (71.2 kg)  11/17/14 150 lb (68 kg)      General: Alert, oriented x3, no distress. Overweight Head: no evidence of trauma, PERRL, EOMI, no exophtalmos or lid lag, no myxedema, no xanthelasma; normal ears, nose and oropharynx Neck: normal jugular venous pulsations and no hepatojugular reflux; brisk carotid pulses without delay and no carotid bruits Chest: clear to auscultation, no signs of consolidation by percussion or palpation, normal fremitus, symmetrical and full respiratory excursions Cardiovascular: normal position and quality of the apical impulse, regular rhythm, normal first and second heart sounds, 1/6 aortic ejection murmur, no diastolic murmurs, rubs or gallops Abdomen: no tenderness or distention, no masses by palpation, no abnormal pulsatility or arterial bruits, normal bowel sounds, no hepatosplenomegaly Extremities: no clubbing, cyanosis or edema; 2+ radial, ulnar and brachial pulses bilaterally; 2+ right femoral, posterior tibial and dorsalis pedis pulses; 2+ left femoral, posterior tibial and dorsalis pedis pulses; no subclavian or femoral bruits Neurological: grossly  nonfocal PSYCHIATRIC:  Normal affect   ASSESSMENT:    1. Vasovagal syncope   2. Controlled type 2 diabetes mellitus with stage 4 chronic kidney disease, without long-term current use of insulin ([place])   3. CKD (chronic kidney disease) stage 4, GFR 15-29 ml/min (H[facility])   4. Essential hypertension    PLAN:    In order of problems listed above:  1. Syncope: probably vagal events, potentiated by excessive baseline bradycardia on beta blocker. No recurrence after lowering the beta blocker dose.. 2. DM: recent A1c 6.1%. Well controlled. 3. CKD 4:  currently not on any diuretics. Losartan restarted per her report. Has appt with Dr. Mercy Moore next month. 4. HTN: BP is well controlled    Medication Adjustments/Labs and Tests Ordered: Current medicines are reviewed at length with the patient today.  Concerns regarding medicines are outlined above. Labs and tests ordered and medication changes  are outlined in the patient instructions below:  Patient Instructions  Medication Instructions:  Your physician recommends that you continue on your current medications as directed. Please refer to the Current Medication list given to you today.  Labwork: None   Testing/Procedures: None   Follow-Up: Your physician wants you to follow-up in: 5 MONTHS with DR Shadrach Bartunek. You will receive a reminder letter in the mail two months in advance. If you don't receive a letter, please call our office to schedule the follow-up appointment.   Any Other Special Instructions Will Be Listed Below (If Applicable).     If you need a refill on your cardiac medications before your next appointment, please call your pharmacy.     Signed, Sanda Klein, MD  07/17/2017 5:40 PM    [facility]

## 2017-11-19 ENCOUNTER — Other Ambulatory Visit: Payer: Self-pay | Admitting: Internal Medicine

## 2017-11-19 ENCOUNTER — Ambulatory Visit
Admission: RE | Admit: 2017-11-19 | Discharge: 2017-11-19 | Disposition: A | Discharge status: Home / Self Care | Payer: Medicare Other | Source: Ambulatory Visit | Attending: Internal Medicine | Admitting: Internal Medicine

## 2017-11-19 DIAGNOSIS — R053 Chronic cough: Secondary | ICD-10-CM

## 2017-11-19 DIAGNOSIS — R05 Cough: Secondary | ICD-10-CM

## 2017-11-19 IMAGING — DX DG CHEST 2V
2 series · 2 of 2 positions shown · non-contrast
Comparison: No recent prior.

ADDENDUM:
Previously identified nodular density represents calcified nodule on
CT obtained. This is consistent with benign nodule.
CLINICAL DATA: Productive cough.

EXAM:
CHEST  2 VIEW

[dg chest 2 view (1 of 2)]
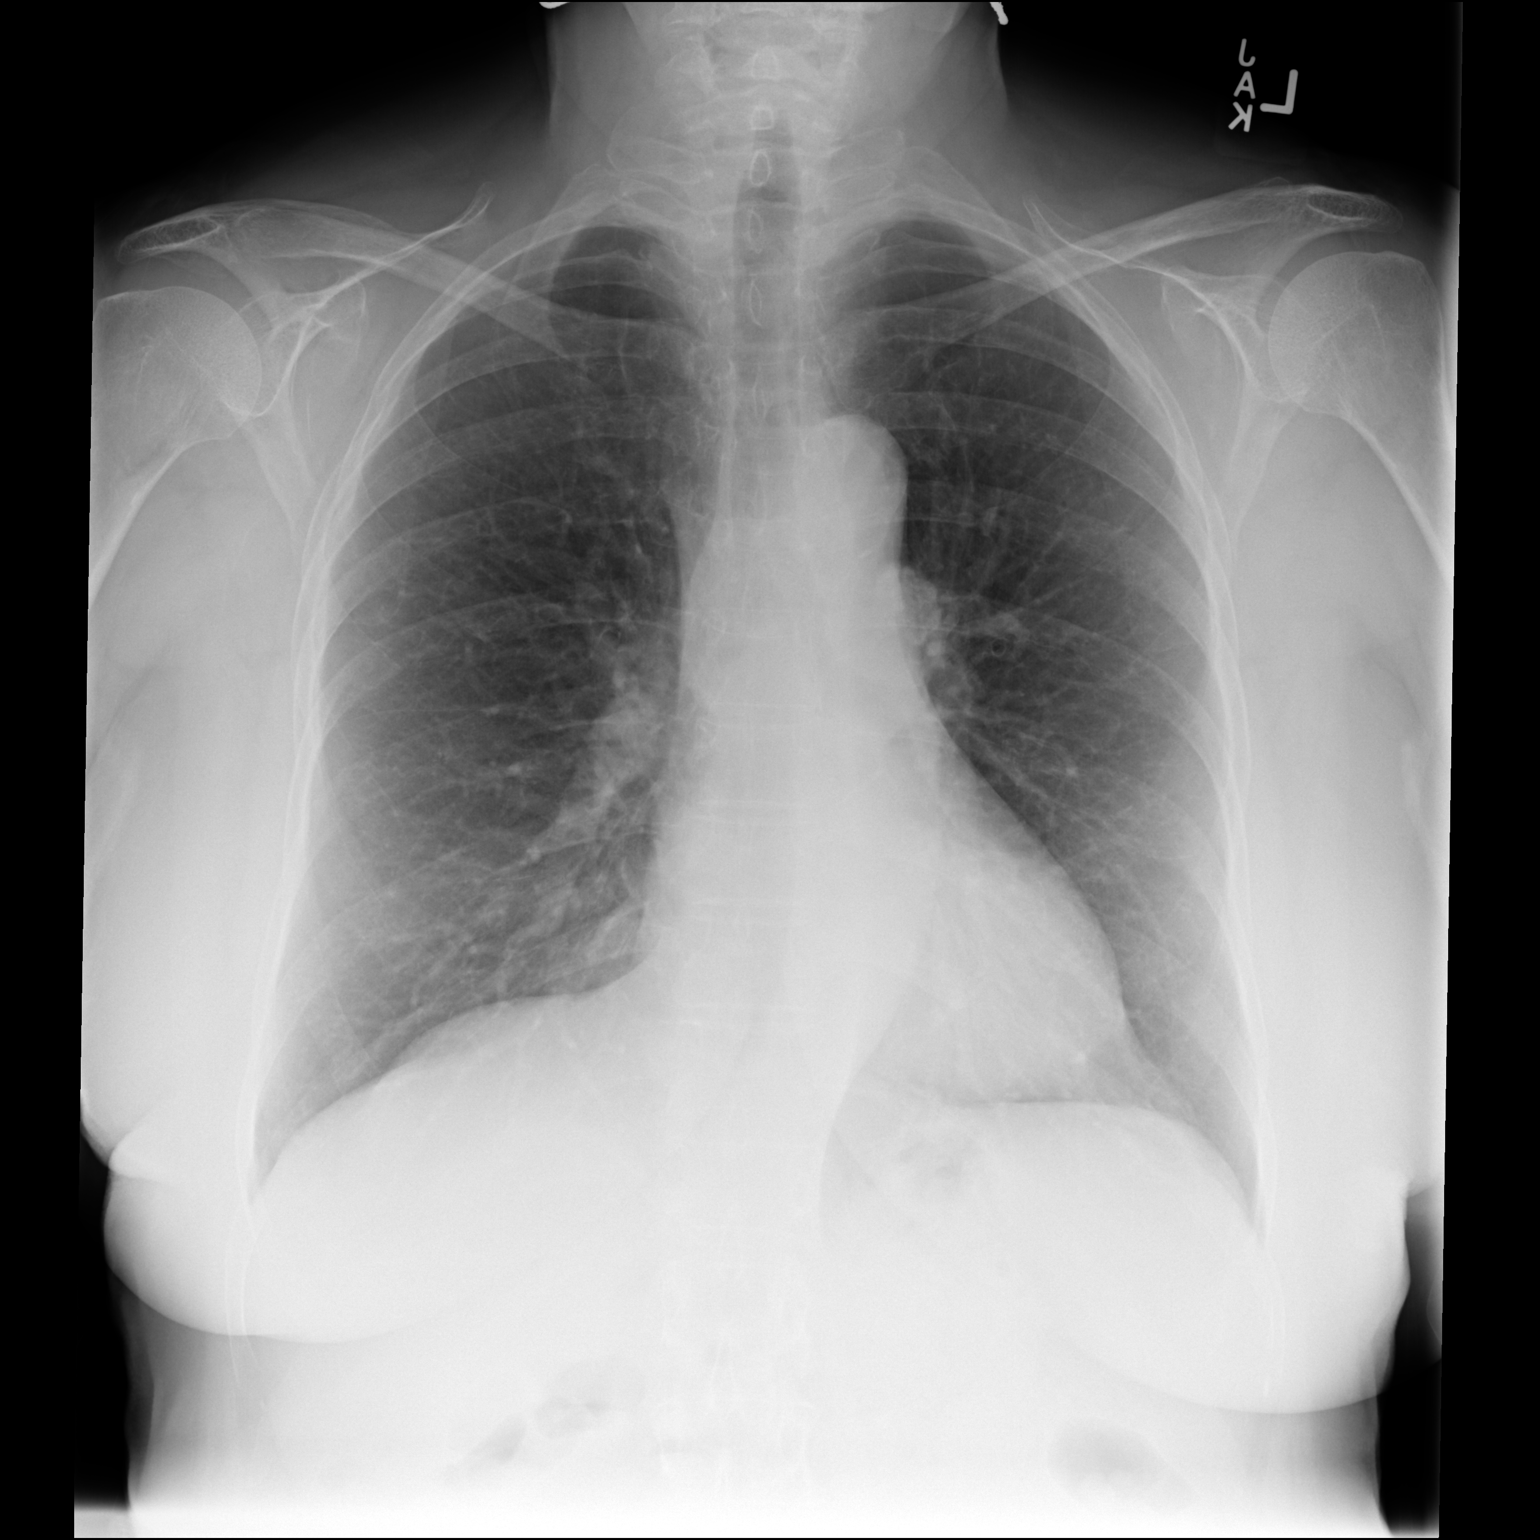

[dg chest 2 view (2 of 2)]
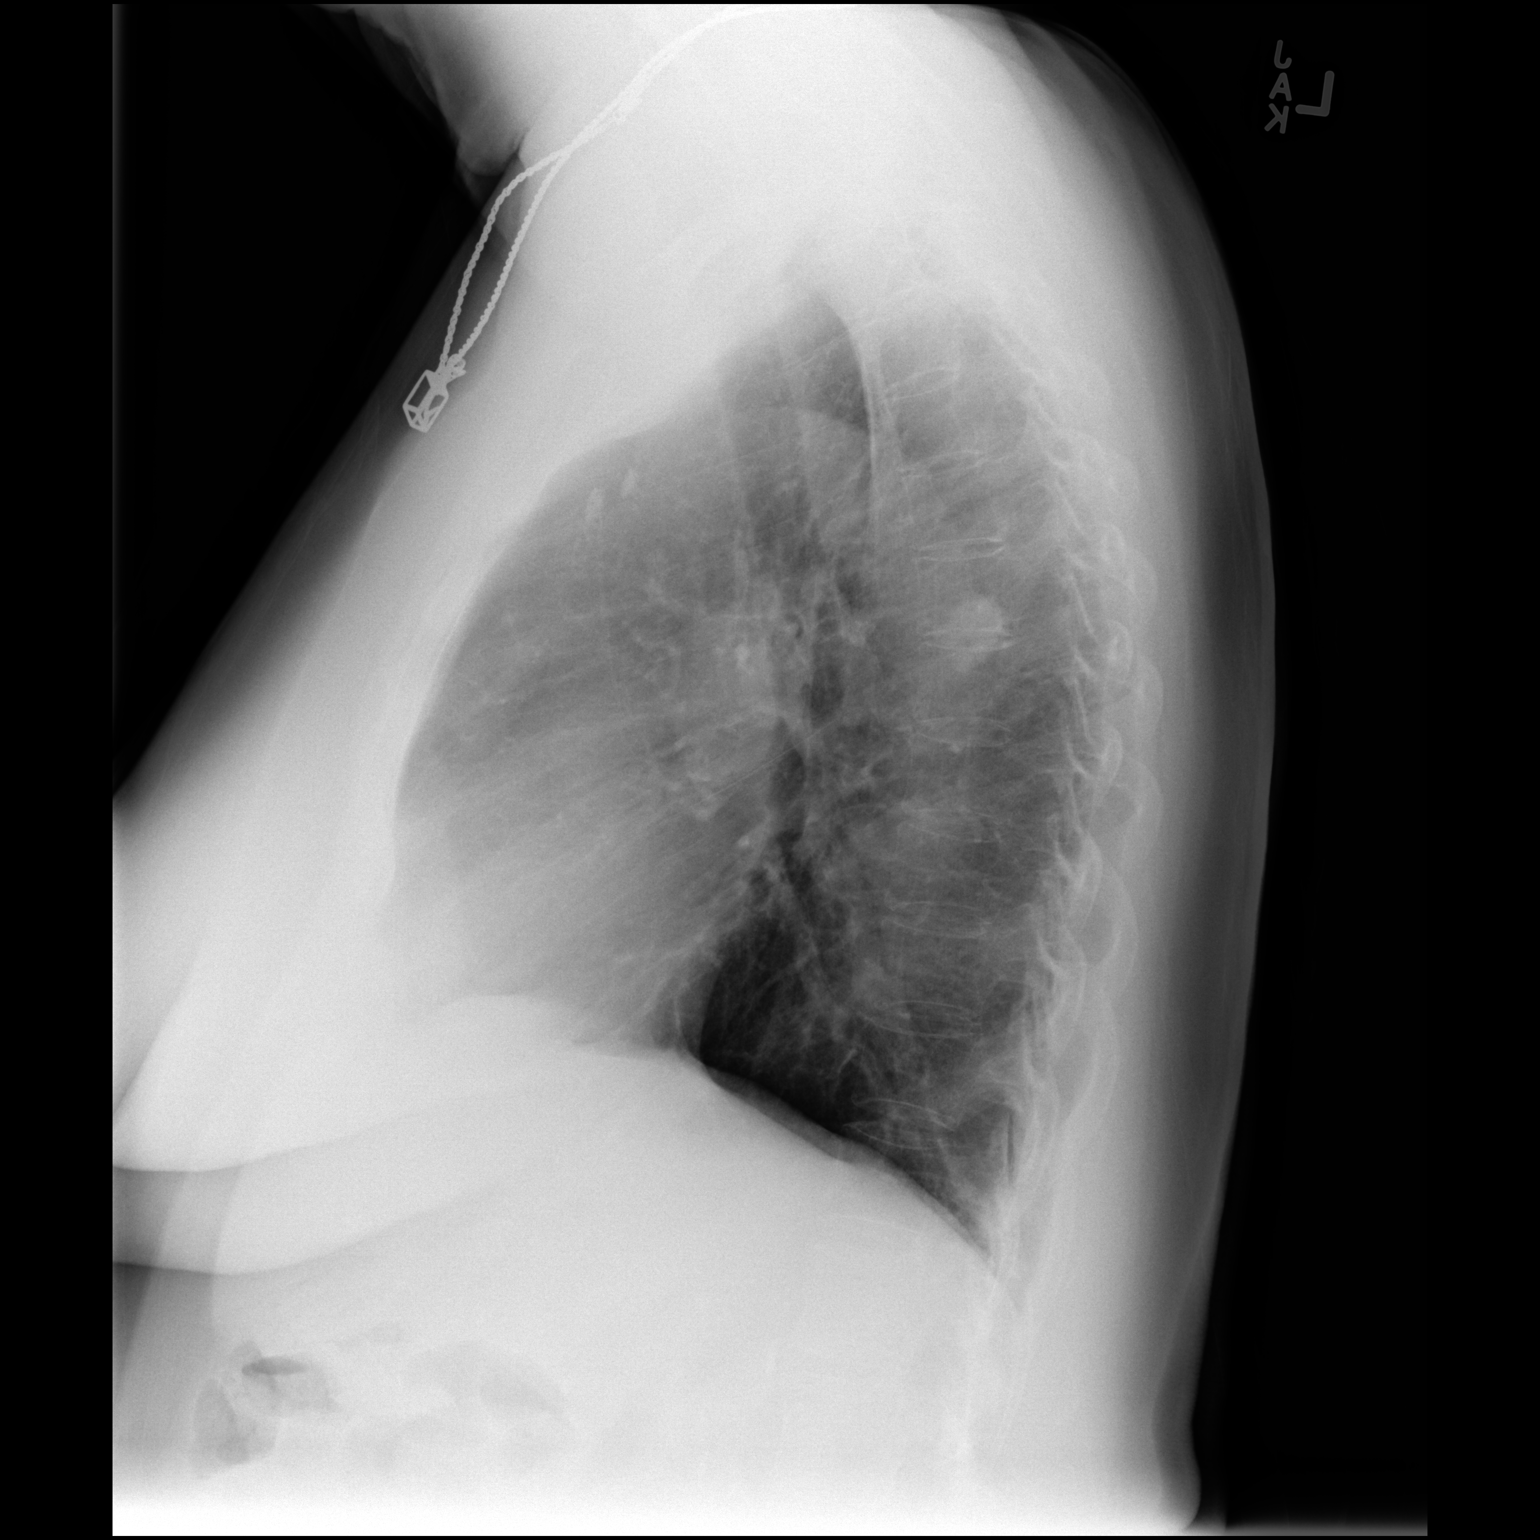

[2 of 2 positions shown; findings below may reference images not displayed]

FINDINGS: Mediastinum and hilar structures normal. Heart size normal. Rounded
density noted projected over the upper posterior chest on lateral
view. This may be in the left upper chest. Contrast-enhanced chest
CT is suggested for further evaluation . No pleural effusion or
pneumothorax.
IMPRESSION: Rounded density noted projected over the upper posterior chest on
lateral view. This may be in the left upper chest. Contrast-enhanced
chest CT is suggested for further evaluation to evaluate for a
pulmonary mass.

These results will be called to the ordering clinician or
representative by the Radiologist Assistant, and communication
documented in the PACS or zVision Dashboard.

## 2017-11-21 ENCOUNTER — Other Ambulatory Visit: Payer: Self-pay | Admitting: Internal Medicine

## 2017-11-21 DIAGNOSIS — R9389 Abnormal findings on diagnostic imaging of other specified body structures: Secondary | ICD-10-CM

## 2017-12-05 ENCOUNTER — Other Ambulatory Visit: Payer: Medicare Other

## 2017-12-07 ENCOUNTER — Ambulatory Visit
Admission: RE | Admit: 2017-12-07 | Discharge: 2017-12-07 | Disposition: A | Discharge status: Home / Self Care | Payer: Medicare Other | Source: Ambulatory Visit | Attending: Internal Medicine | Admitting: Internal Medicine

## 2017-12-07 DIAGNOSIS — R9389 Abnormal findings on diagnostic imaging of other specified body structures: Secondary | ICD-10-CM

## 2017-12-07 IMAGING — CT CT CHEST W/O CM
3 of 4 series · 16 of 30 positions shown, 18 images · non-contrast
Comparison: Chest radiograph [DATE]

CLINICAL DATA: Follow lung nodule seen on chest radiograph.

EXAM:
CT CHEST WITHOUT CONTRAST
TECHNIQUE: Multidetector CT imaging of the chest was performed following the
standard protocol without IV contrast.

[Series 3: chest w/o · axial · non-contrast · 0.77mm/px · z∈[-234,-7]mm · 6 of 129 slices shown]
[im 19/129  lung]
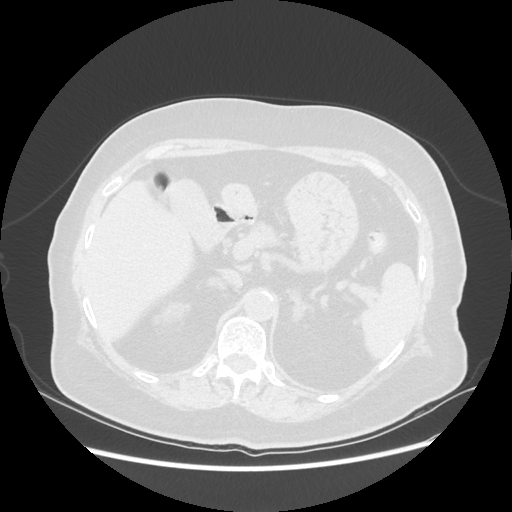
[im 37/129  lung]
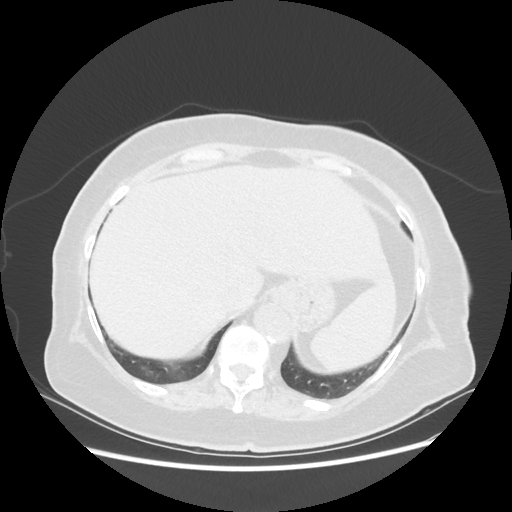
[im 55/129  lung]
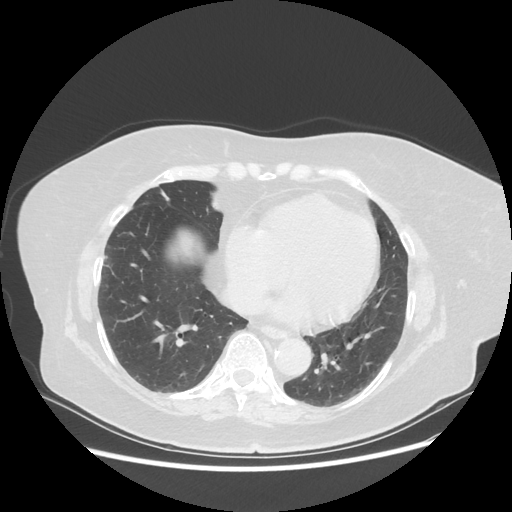
[im 74/129  lung]
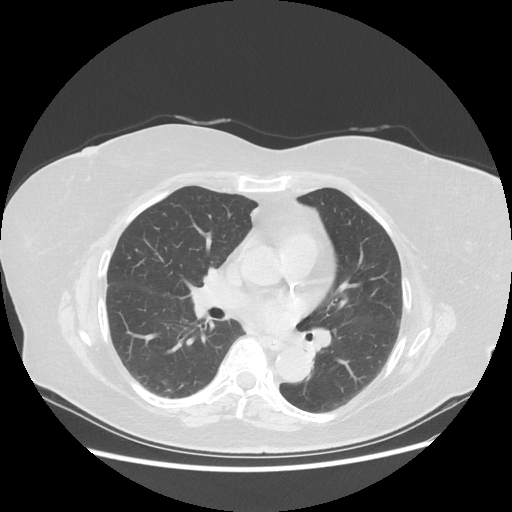
[im 92/129  lung]
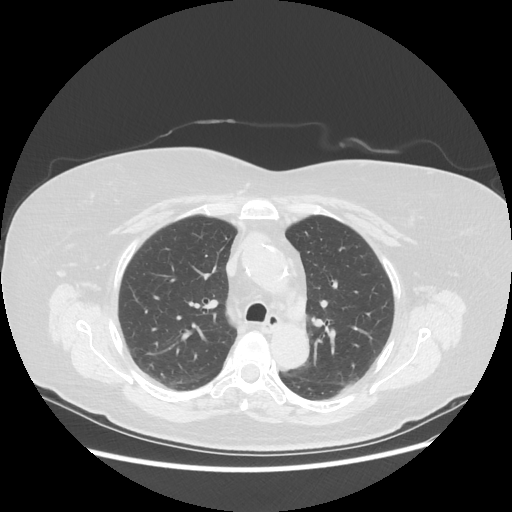
[im 110/129  lung]
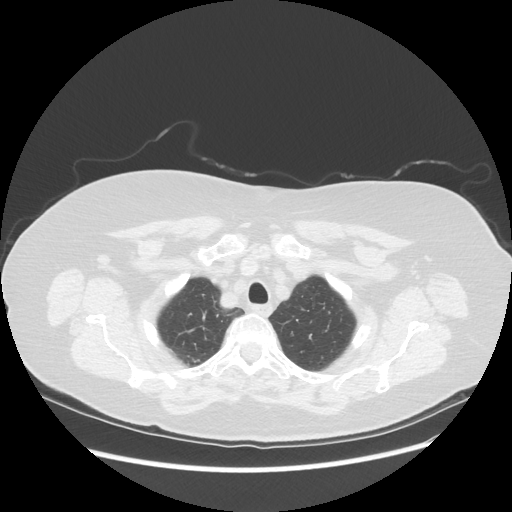

[Series 4: lung windows · axial · 0.77mm/px · z∈[-239,+1]mm · 7 of 129 slices shown, 9 images]
[im 17/129  mediastinal]
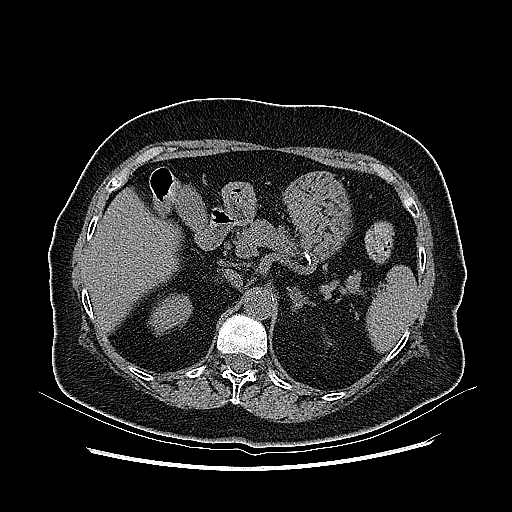
[im 17/129  lung]
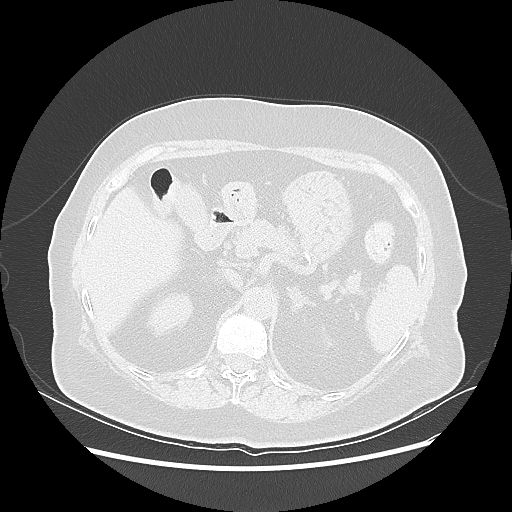
[im 33/129  lung]
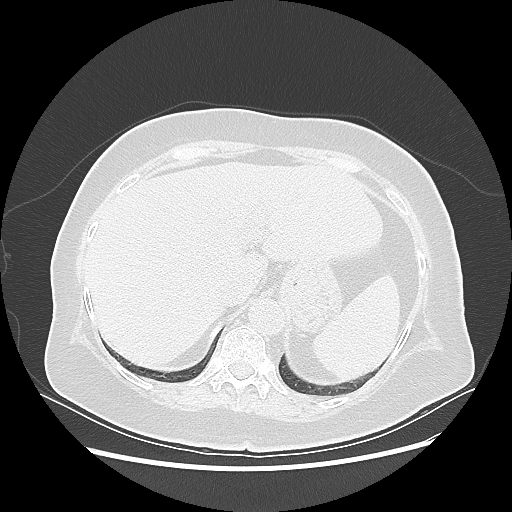
[im 49/129  lung]
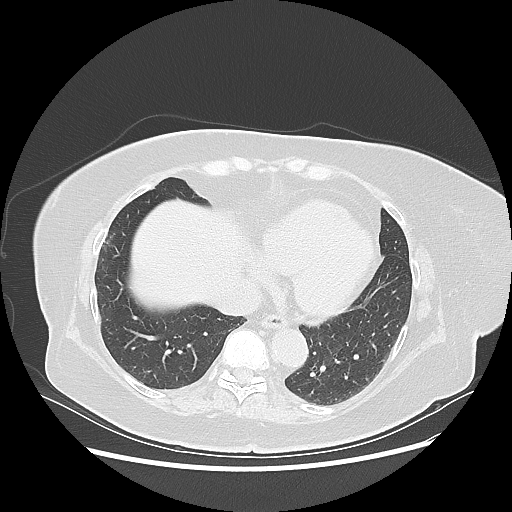
[im 65/129  lung]
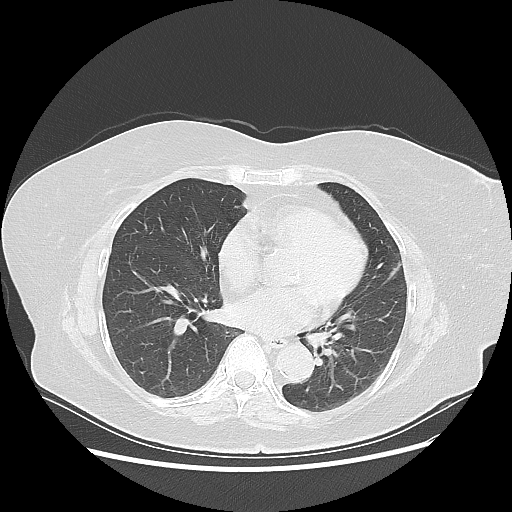
[im 81/129  mediastinal]
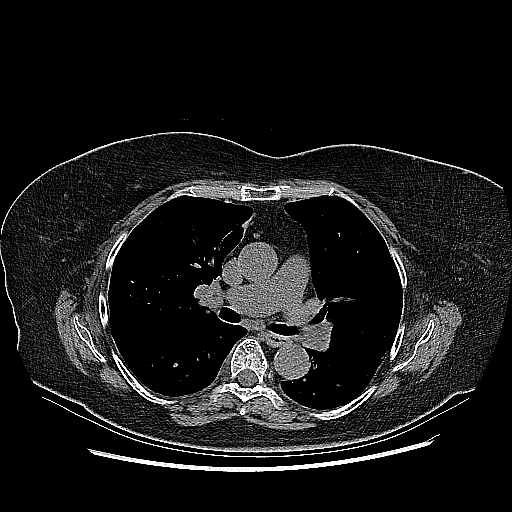
[im 81/129  lung]
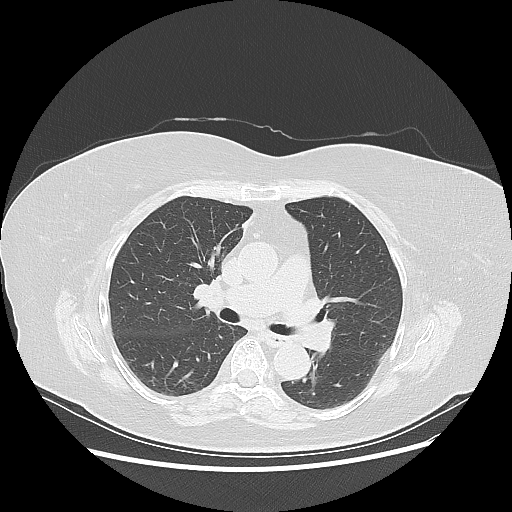
[im 97/129  lung]
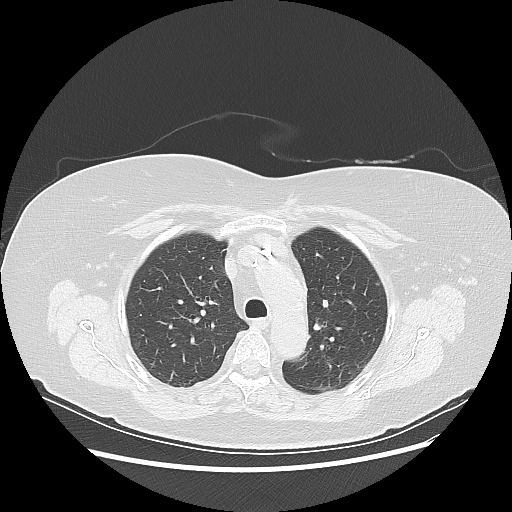
[im 113/129  lung]
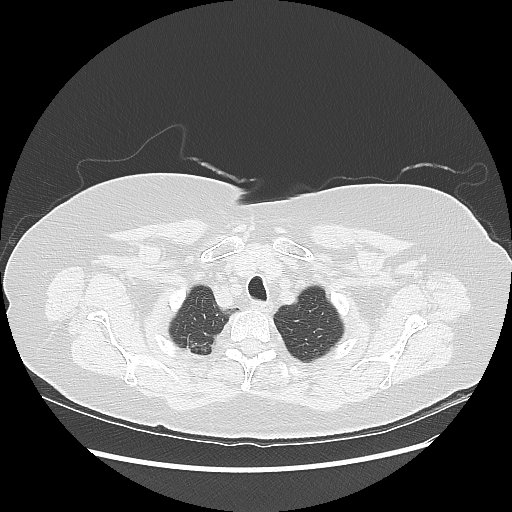

[Series 602: sagittal body · sagittal · 0.77mm/px · 3 of 158 slices shown]
[im 16/158  mediastinal]
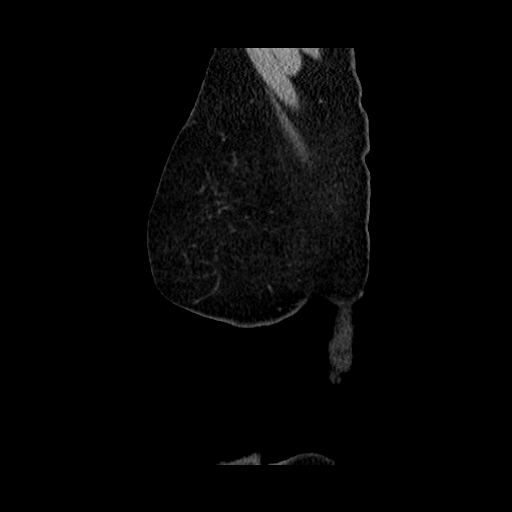
[im 32/158  mediastinal]
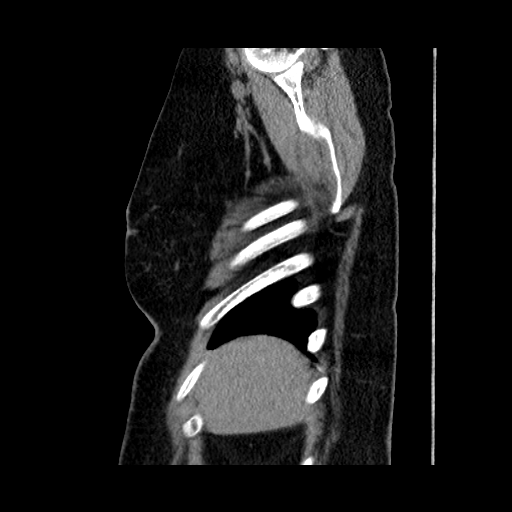
[im 48/158  mediastinal]
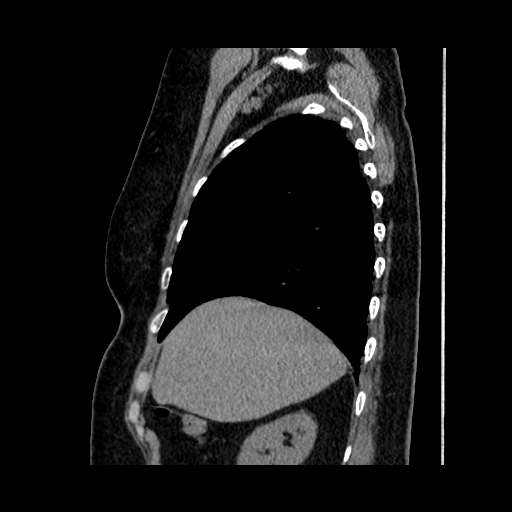

[16 of 30 positions shown; findings below may reference images not displayed]

FINDINGS: Cardiovascular: Atherosclerotic calcifications involving the aorta
and coronary arteries. Normal caliber of the thoracic aorta. Heart
size is with is within normal limits. No significant pericardial
fluid.

Mediastinum/Nodes: No mediastinal or hilar lymphadenopathy. No
axillary lymphadenopathy. Thyroid tissue is prominent for size.
Esophagus is unremarkable.

Lungs/Pleura: Trachea and mainstem bronchi are patent. Nodular
structure from the recent chest radiograph represents a large
pleural-based calcified structure along the descending thoracic
aorta and superior segment of the left lower lobe. This measures
x 1.0 x 1.9 cm. 2 mm nodule in the right upper lobe on sequence 4,
image 48 is probably an incidental finding. No significant airspace
disease or lung consolidation. No pleural effusions.

Upper Abdomen: Images of upper abdomen are unremarkable.
Atherosclerotic calcifications in the abdominal aorta.

Musculoskeletal: No acute bone abnormality.
IMPRESSION: Large calcification in the superior segment of left lower lobe
corresponds with the nodule seen on the recent chest radiograph.
This is most compatible with a calcified granuloma. Although this
structure is adjacent to the descending thoracic aorta, a calcified
vascular structure or aneurysm is thought to be unlikely.

Aortic Atherosclerosis ([RD]-170.0)

Punctate nodular density in the right upper lung is likely an
incidental finding. No follow-up needed if patient is low-risk.
Non-contrast chest CT can be considered in 12 months if patient is
high-risk. This recommendation follows the consensus statement:
Guidelines for Management of Incidental Pulmonary Nodules Detected

## 2017-12-13 ENCOUNTER — Other Ambulatory Visit: Payer: Self-pay | Admitting: *Deleted

## 2017-12-13 ENCOUNTER — Telehealth: Payer: Self-pay | Admitting: Cardiovascular Disease

## 2017-12-13 MED ORDER — FUROSEMIDE 40 MG PO TABS: 40.0000 mg | ORAL_TABLET | Freq: Every day | ORAL | 2 refills | Status: DC

## 2017-12-13 MED ORDER — SIMVASTATIN 20 MG PO TABS: 20.0000 mg | ORAL_TABLET | Freq: Every evening | ORAL | 2 refills | Status: DC

## 2017-12-13 MED ORDER — AMLODIPINE BESYLATE 5 MG PO TABS: 5.0000 mg | ORAL_TABLET | Freq: Every day | ORAL | 2 refills | Status: DC

## 2017-12-13 MED ORDER — METOPROLOL SUCCINATE ER 25 MG PO TB24: 25.0000 mg | ORAL_TABLET | Freq: Every day | ORAL | 2 refills | Status: DC

## 2017-12-13 NOTE — Telephone Encounter (Signed)
Returned call. Cecille Rubin from [facility] calling to get most UTD meds. Noted she needed clarification on which dose of metoprolol pt should be taking. I faxed her documentation regarding the metoprolol change done in July from metoprolol 50mg  to 25mg  daily after confirming dose w patient.  When I spoke to patient, she stated she also needed refill authorizations on amlodipine, statin and "fluid pill" (furosemide). I have sent these in for 90 day supply as requested.  Pt aware to call if further needs.

## 2017-12-13 NOTE — Telephone Encounter (Signed)
Pt c/o medication issue:  1. Name of Medication: metoprolol   2. How are you currently taking this medication (dosage and times per day)? 25mg  1x day  3. Are you having a reaction (difficulty breathing--STAT)?  no  4. What is your medication issue? Monica Hunt said that it is suppose to be 50mg  1x day she called medical records and the pt pharmacy   If it has changed please fax a copy to her [phone] attention Kendrick Fries

## 2018-05-24 ENCOUNTER — Other Ambulatory Visit: Payer: Self-pay | Admitting: Internal Medicine

## 2018-05-24 DIAGNOSIS — Z1231 Encounter for screening mammogram for malignant neoplasm of breast: Secondary | ICD-10-CM

## 2018-06-17 ENCOUNTER — Ambulatory Visit
Admission: RE | Admit: 2018-06-17 | Discharge: 2018-06-17 | Disposition: A | Discharge status: Home / Self Care | Payer: Medicare Other | Source: Ambulatory Visit | Attending: Internal Medicine | Admitting: Internal Medicine

## 2018-06-17 DIAGNOSIS — Z1231 Encounter for screening mammogram for malignant neoplasm of breast: Secondary | ICD-10-CM

## 2018-11-18 ENCOUNTER — Other Ambulatory Visit: Payer: Self-pay | Admitting: Internal Medicine

## 2018-11-18 DIAGNOSIS — R911 Solitary pulmonary nodule: Secondary | ICD-10-CM

## 2018-11-26 ENCOUNTER — Ambulatory Visit
Admission: RE | Admit: 2018-11-26 | Discharge: 2018-11-26 | Disposition: A | Discharge status: Home / Self Care | Payer: Medicare Other | Source: Ambulatory Visit | Attending: Internal Medicine | Admitting: Internal Medicine

## 2018-11-26 DIAGNOSIS — R911 Solitary pulmonary nodule: Secondary | ICD-10-CM

## 2018-11-26 IMAGING — CT CT CHEST W/O CM
2 of 4 series · 12 of 36 positions shown, 15 images · non-contrast
Comparison: [DATE]

CLINICAL DATA: One year follow-up of pulmonary nodule.

EXAM:
CT CHEST WITHOUT CONTRAST
TECHNIQUE: Multidetector CT imaging of the chest was performed following the
standard protocol without IV contrast.

[Series 2: chest 2.00 br40 s3 ax · axial · 0.49mm/px · z∈[+1390,+1642]mm · 9 of 150 slices shown, 12 images]
[im 12/150  mediastinal]
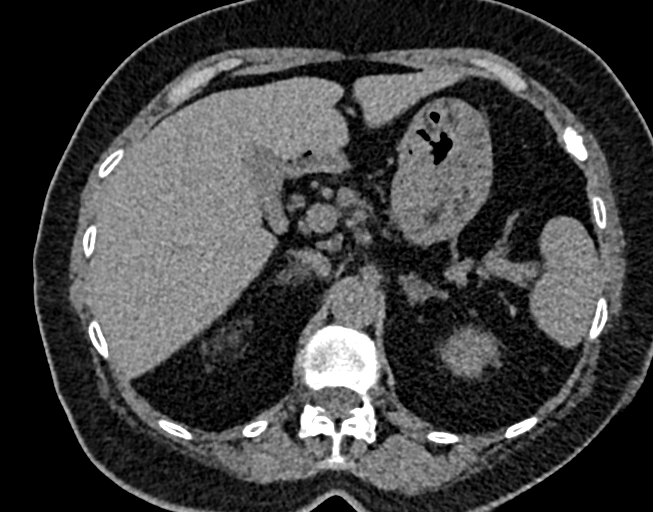
[im 12/150  lung]
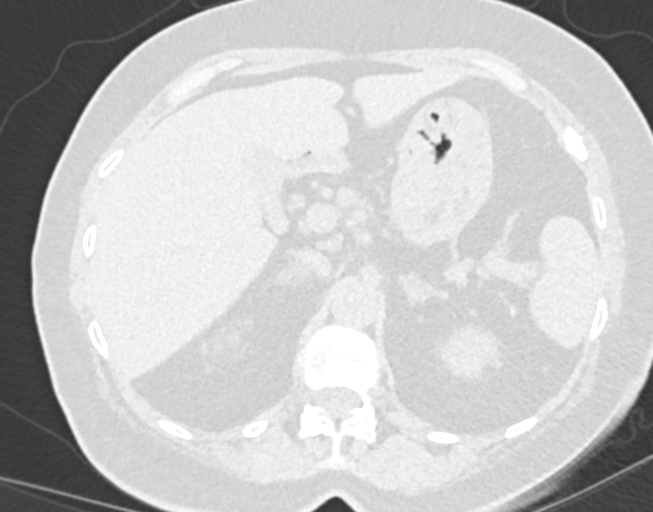
[im 35/150  lung]
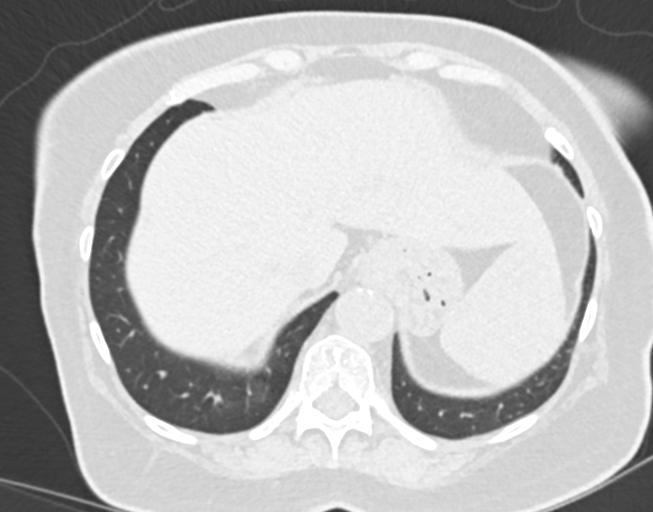
[im 46/150  lung]
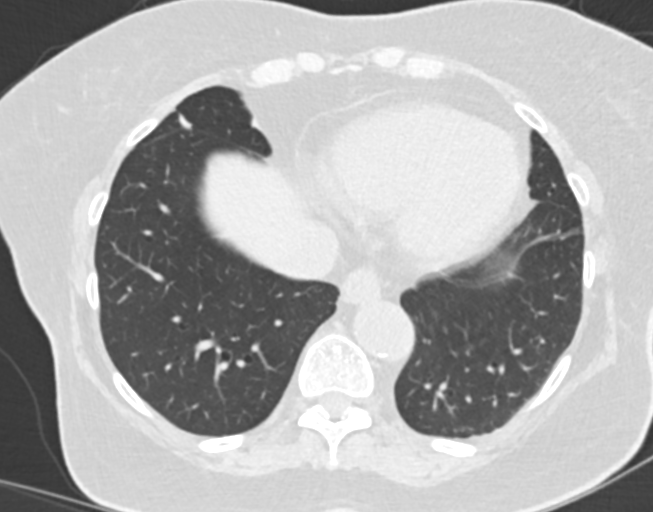
[im 58/150  lung]
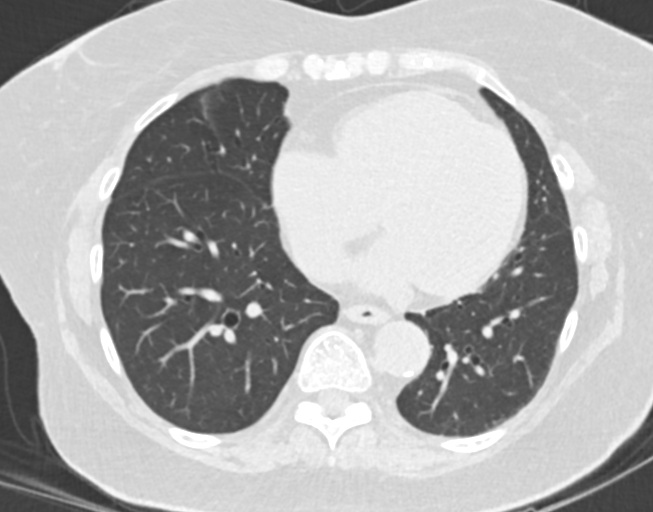
[im 81/150  mediastinal]
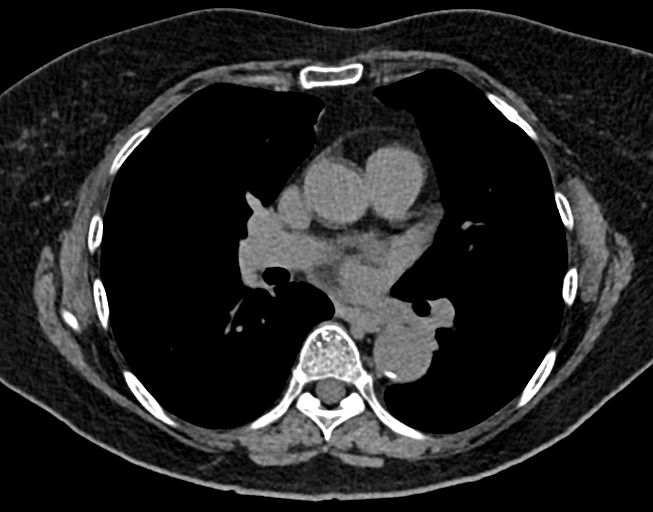
[im 81/150  lung]
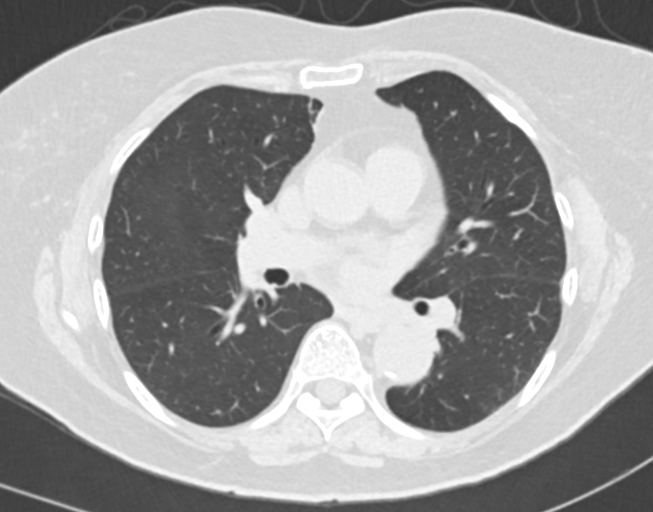
[im 92/150  lung]
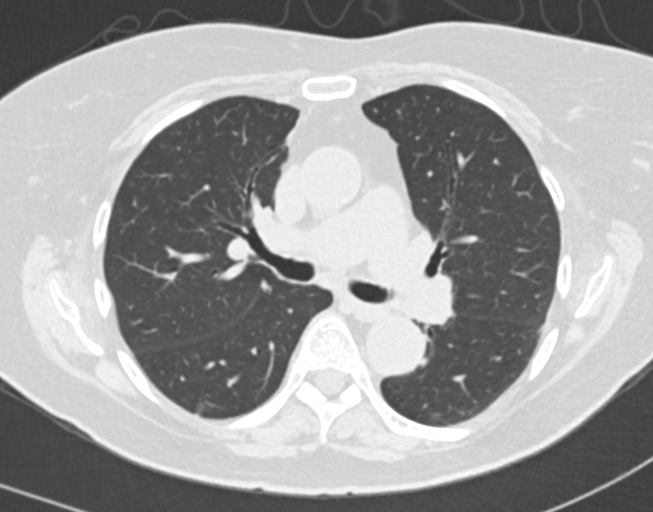
[im 104/150  lung]
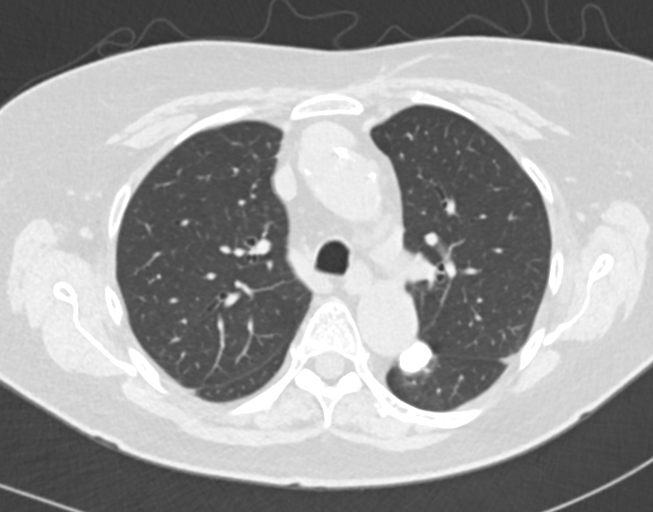
[im 127/150  lung]
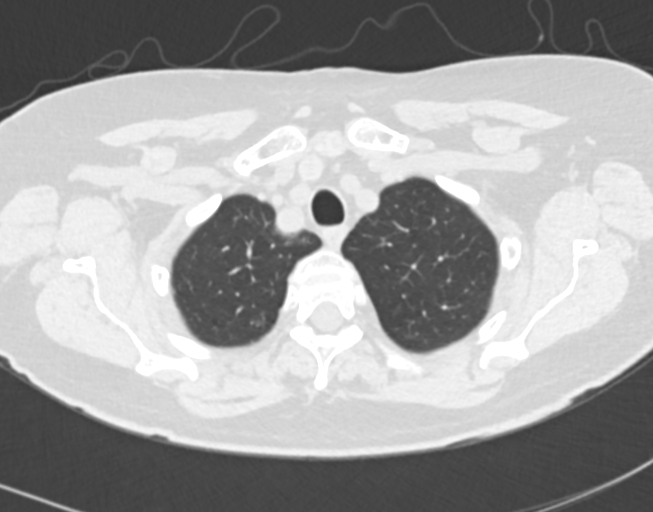
[im 138/150  mediastinal]
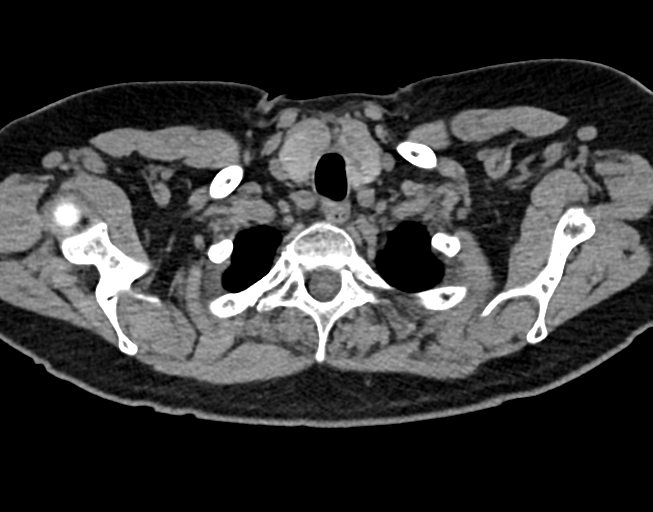
[im 138/150  lung]
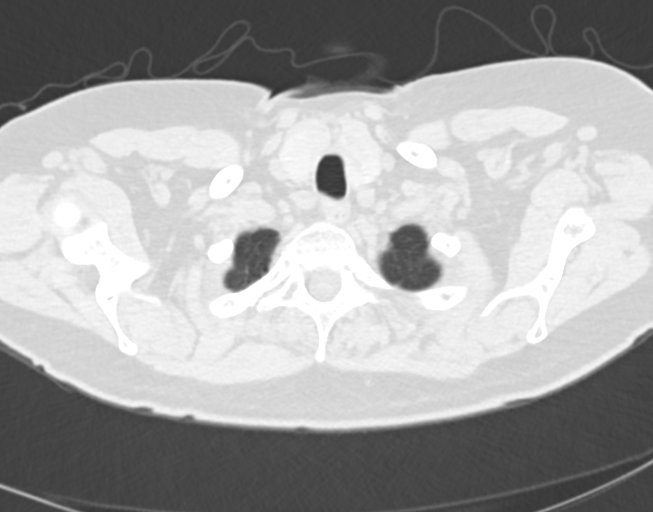

[Series 4: chest 2.00 br40 s3 cor · coronal · 0.59mm/px · 3 of 125 slices shown]
[im 25/125  lung]
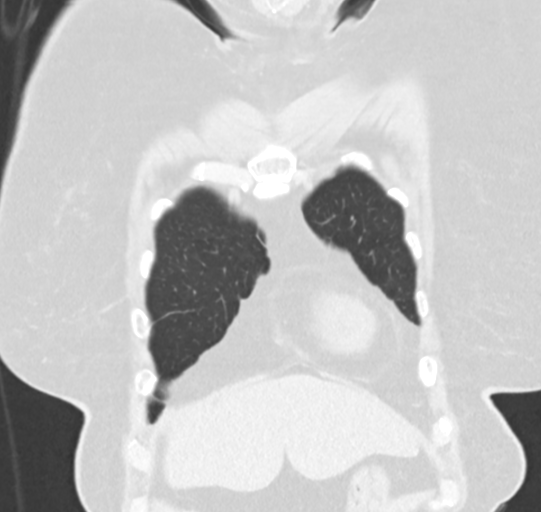
[im 50/125  lung]
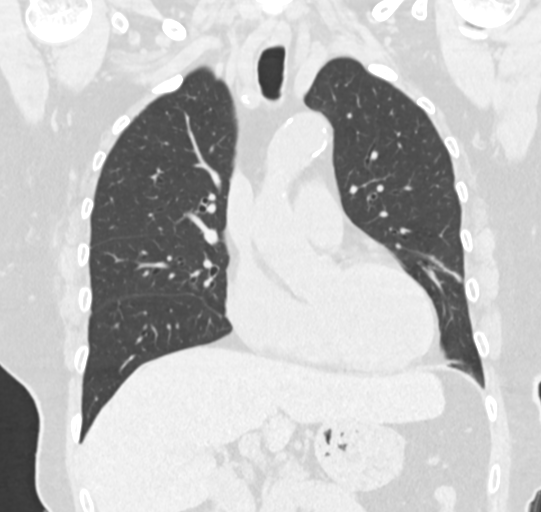
[im 75/125  lung]
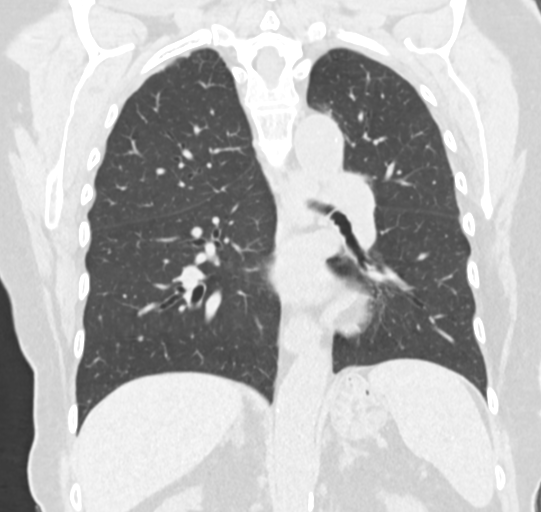

[12 of 36 positions shown; findings below may reference images not displayed]

FINDINGS: Cardiovascular: Atherosclerotic changes are seen in the
nonaneurysmal aorta. Coronary artery calcifications. The heart is
unchanged. The central pulmonary arteries are unremarkable.

Mediastinum/Nodes: A multinodular thyroid is stable. No effusions.
The esophagus is unremarkable. No adenopathy.

Lungs/Pleura: Central airways are normal. No pneumothorax. The right
upper lobe tiny nodule on the previous study is no longer
visualized. A 3 mm ground-glass nodule in the right apex is stable.
No other suspicious uncalcified pulmonary nodules. The calcified
nodular density adjacent to the descending thoracic aorta is stable,
abutting the pleura. Scattered subsegmental atelectasis including in
the lingula. No mass or focal infiltrate.

Upper Abdomen: No acute abnormality.

Musculoskeletal: No chest wall mass or suspicious bone lesions
identified.
IMPRESSION: 1. A tiny 3 mm ground-glass nodule in the right apex is stable since
[DATE]. No follow-up recommended. This recommendation
follows the consensus statement: Guidelines for Management of
Incidental Pulmonary Nodules Detected on CT Images: From the
2. The tiny nodule in the right upper lobe seen previously has
resolved.
3. A calcified nodule in the medial left upper lobe, abutting the
pleura, is stable as well.
4. Atherosclerotic changes in the nonaneurysmal aorta. Coronary
artery calcified atherosclerotic change.
5. Stable much I nodular goiter.

Aortic Atherosclerosis ([I5]-[I5]).

## 2018-12-13 ENCOUNTER — Ambulatory Visit: Payer: Medicare Other | Admitting: Physician Assistant

## 2018-12-13 ENCOUNTER — Encounter: Payer: Self-pay | Admitting: Physician Assistant

## 2018-12-13 VITALS — BP 130/60 | HR 58 | Ht 62.5 in | Wt 143.6 lb

## 2018-12-13 DIAGNOSIS — Z9189 Other specified personal risk factors, not elsewhere classified: Secondary | ICD-10-CM | POA: Diagnosis not present

## 2018-12-13 DIAGNOSIS — R9431 Abnormal electrocardiogram [ECG] [EKG]: Secondary | ICD-10-CM

## 2018-12-13 DIAGNOSIS — R55 Syncope and collapse: Secondary | ICD-10-CM | POA: Diagnosis not present

## 2018-12-13 DIAGNOSIS — I10 Essential (primary) hypertension: Secondary | ICD-10-CM

## 2018-12-13 MED ORDER — OMEGA-3-ACID ETHYL ESTERS 1 G PO CAPS: 1.0000 g | ORAL_CAPSULE | Freq: Two times a day (BID) | ORAL | 6 refills | Status: DC

## 2018-12-13 MED ORDER — METOPROLOL SUCCINATE ER 25 MG PO TB24: 25.0000 mg | ORAL_TABLET | Freq: Every day | ORAL | 2 refills | Status: DC

## 2018-12-13 NOTE — Progress Notes (Signed)
Cardiology Office Note    Date:  12/16/2018   ID:  Monica, Hunt Mar 03, 1941, MRN [id number]  PCP:  Lavone Orn, MD  Cardiologist: Dr. Sallyanne Kuster  Chief Complaint  Patient presents with  . Follow-up    seen for Dr. Sallyanne Kuster.    History of Present Illness:  Monica Hunt is a 77 y.o. female with PMH of HTN, CKD, RLS, GERD, hypothyroidism and diabetes mellitus.  She had a previous rhythm monitor that showed persistent bradycardia.  After reducing her beta-blocker, she had normal syncopal events.  Patient presents today for cardiology office visit.  She has significant concern about developing coronary artery disease.  Her cardiac risk factors include a CKD, hypertension, diabetes and age.  I recommended a plain old treadmill test for additional evaluation.  Otherwise she has no lower extremity edema, orthopnea or PND.   Past Medical History:  Diagnosis Date  . Cataracts, bilateral   . Depression   . Diabetes mellitus without complication ([place])   . Hypertension     Past Surgical History:  Procedure Laterality Date  . ABDOMINAL HYSTERECTOMY     vaginal  . CARPAL TUNNEL RELEASE Right   . COLONOSCOPY WITH PROPOFOL N/A 11/17/2014   Procedure: COLONOSCOPY WITH PROPOFOL;  Surgeon: Garlan Fair, MD;  Location: [facility];  Service: Endoscopy;  Laterality: N/A;    Current Medications: Outpatient Medications Prior to Visit  Medication Sig Dispense Refill  . amLODipine (NORVASC) 5 MG tablet Take 1 tablet (5 mg total) by mouth daily. 90 tablet 2  . Calcium Carb-Cholecalciferol (CALCIUM 500+D PO) Take 1 tablet by mouth daily.    . Calcium Carbonate-Vitamin D (CALCIUM + D PO) Take 1 tablet by mouth daily.    . cholecalciferol (VITAMIN D) 1000 UNITS tablet Take 1,000 Units by mouth daily.    . clonazePAM (KLONOPIN) 0.5 MG tablet Take 0.5 mg by mouth 2 (two) times daily as needed for anxiety.    [name] EPINEPHrine (EPIPEN 2-PAK) 0.3 mg/0.3 mL IJ SOAJ injection Inject 0.3 mg into the  muscle once.    . furosemide (LASIX) 40 MG tablet Take 1 tablet (40 mg total) by mouth daily. 90 tablet 2  . IRON PO Take by mouth.    . losartan (COZAAR) 50 MG tablet Take 50 mg by mouth 2 (two) times daily.    . methimazole (TAPAZOLE) 5 MG tablet Take 5 mg by mouth daily. Take 1 and 1/2 tab alternating once a day    . Multiple Vitamin (MULTIVITAMIN WITH MINERALS) TABS tablet Take 1 tablet by mouth daily.    [name] omeprazole (PRILOSEC) 40 MG capsule Take 40 mg by mouth daily.    . promethazine (PHENERGAN) 25 MG tablet Take 25 mg by mouth every 6 (six) hours as needed for nausea or vomiting.    [name] rOPINIRole (REQUIP) 3 MG tablet Take 3 mg by mouth at bedtime. Take 1 tab 1 to 3 hours before bedtime    . simvastatin (ZOCOR) 20 MG tablet Take 1 tablet (20 mg total) by mouth every evening. 90 tablet 2  . traZODone (DESYREL) 50 MG tablet 50 mg. Take 1-2 tabs daily at bedtime    . metoprolol succinate (TOPROL-XL) 25 MG 24 hr tablet Take 1 tablet (25 mg total) by mouth daily. Take with or immediately following a meal. 90 tablet 2  . Omega 3 1000 MG CAPS Take 2 capsules by mouth 2 (two) times daily.     No facility-administered medications prior to visit.  Allergies:   Other   Social History   Socioeconomic History  . Marital status: Divorced    Spouse name: Not on file  . Number of children: Not on file  . Years of education: Not on file  . Highest education level: Not on file  Occupational History  . Not on file  Social Needs  . Financial resource strain: Not on file  . Food insecurity:    Worry: Not on file    Inability: Not on file  . Transportation needs:    Medical: Not on file    Non-medical: Not on file  Tobacco Use  . Smoking status: Current Every Day Smoker    Packs/day: 0.50    Years: 45.00    Pack years: 22.50  . Smokeless tobacco: Never Used  Substance and Sexual Activity  . Alcohol use: No  . Drug use: No  . Sexual activity: Not on file  Lifestyle  . Physical  activity:    Days per week: Not on file    Minutes per session: Not on file  . Stress: Not on file  Relationships  . Social connections:    Talks on phone: Not on file    Gets together: Not on file    Attends religious service: Not on file    Active member of club or organization: Not on file    Attends meetings of clubs or organizations: Not on file    Relationship status: Not on file  Other Topics Concern  . Not on file  Social History Narrative  . Not on file     Family History:  The patient's family history is not on file.   ROS:   Please see the history of present illness.    ROS All other systems reviewed and are negative.   PHYSICAL EXAM:   VS:  BP 130/60   Pulse (!) 58   Ht 5' 2.5" (1.588 m)   Wt 143 lb 9.6 oz (65.1 kg)   BMI 25.85 kg/m    GEN: Well nourished, well developed, in no acute distress  HEENT: normal  Neck: no JVD, carotid bruits, or masses Cardiac: RRR; no murmurs, rubs, or gallops,no edema  Respiratory:  clear to auscultation bilaterally, normal work of breathing GI: soft, nontender, nondistended, + BS MS: no deformity or atrophy  Skin: warm and dry, no rash Neuro:  Alert and Oriented x 3, Strength and sensation are intact Psych: euthymic mood, full affect  Wt Readings from Last 3 Encounters:  12/13/18 143 lb 9.6 oz (65.1 kg)  07/17/17 158 lb (71.7 kg)  05/17/17 157 lb (71.2 kg)      Studies/Labs Reviewed:   EKG:  EKG is ordered today.  The ekg ordered today demonstrates sinus bradycardia, heart rate 58, poor R wave progression in the anterior leads, unchanged when compared to the previous EKG.  Recent Labs: No results found for requested labs within last 8760 hours.   Lipid Panel No results found for: CHOL, TRIG, HDL, CHOLHDL, VLDL, LDLCALC, LDLDIRECT  Additional studies/ records that were reviewed today include:   Echo 05/31/2017 LV EF: 55% -   60% Study Conclusions  - Left ventricle: The cavity size was normal. Systolic function  was   normal. The estimated ejection fraction was in the range of 55%   to 60%. Wall motion was normal; there were no regional wall   motion abnormalities. There was an increased relative   contribution of atrial contraction to ventricular filling.  Doppler parameters are consistent with abnormal left ventricular   relaxation (grade 1 diastolic dysfunction). - Aortic valve: Trileaflet; normal thickness, mildly calcified   leaflets. - Atrial septum: There was increased thickness of the septum,   consistent with lipomatous hypertrophy. - Pulmonary arteries: PA peak pressure: 31 mm Hg (S).   ASSESSMENT:    1. Abnormal EKG   2. Essential hypertension   3. Vasovagal syncope   4. Cardiovascular risk factor      PLAN:  In order of problems listed above:  1. Abnormal EKG: Patient has poor R wave progression in the anterior leads.  She is very concerned of her overall cardiac risk.  I recommended initial work-up with a plain old treadmill test.  She is aware that she need to hold beta-blocker for 24 hours prior to the procedure.  2. Hypertension: Blood pressure stable on current therapy  3. History of vasovagal syncope: No recurrence    Medication Adjustments/Labs and Tests Ordered: Current medicines are reviewed at length with the patient today.  Concerns regarding medicines are outlined above.  Medication changes, Labs and Tests ordered today are listed in the Patient Instructions below. Patient Instructions  Medication Instructions:  START Lovaza (omega 3-ethyl esters) 1 gram twice daily.  If you need a refill on your cardiac medications before your next appointment, please call your pharmacy.   Lab work: Please have a fasting lipid panel checked at you primary care providers office.  Testing/Procedures: Your physician has requested that you have an exercise tolerance test. For further information please visit [email or web address]. Please also follow instruction sheet, as  given.  HOLD Metoprolol the morning of your test.  Follow-Up: At [facility], you and your health needs are our priority.  As part of our continuing mission to provide you with exceptional heart care, we have created designated Provider Care Teams.  These Care Teams include your primary Cardiologist (physician) and Advanced Practice Providers (APPs -  Physician Assistants and Nurse Practitioners) who all work together to provide you with the care you need, when you need it. You will need a follow up appointment in 1 years with Dr. Sallyanne Kuster.  Please call our office 2 months in advance (October 2020) to schedule this appointment.    Advanced Practice Providers on your designated Care Team: [place], [place] . Fabian Sharp, PA-C  Any Other Special Instructions Will Be Listed Below (If Applicable). Please call to verify if you are taking Losartan 50 mg daily.      Hilbert Corrigan, [place]  12/16/2018 12:03 AM    [address], [place], [place]  [address] Phone: [phone]; Fax: [phone]

## 2018-12-13 NOTE — Patient Instructions (Signed)
Medication Instructions:  START Lovaza (omega 3-ethyl esters) 1 gram twice daily.  If you need a refill on your cardiac medications before your next appointment, please call your pharmacy.   Lab work: Please have a fasting lipid panel checked at you primary care providers office.  Testing/Procedures: Your physician has requested that you have an exercise tolerance test. For further information please visit [email or web address]. Please also follow instruction sheet, as given.  HOLD Metoprolol the morning of your test.  Follow-Up: At [facility], you and your health needs are our priority.  As part of our continuing mission to provide you with exceptional heart care, we have created designated Provider Care Teams.  These Care Teams include your primary Cardiologist (physician) and Advanced Practice Providers (APPs -  Physician Assistants and Nurse Practitioners) who all work together to provide you with the care you need, when you need it. You will need a follow up appointment in 1 years with Dr. Sallyanne Kuster.  Please call our office 2 months in advance (October 2020) to schedule this appointment.    Advanced Practice Providers on your designated Care Team: [place], [place] . Fabian Sharp, PA-C  Any Other Special Instructions Will Be Listed Below (If Applicable). Please call to verify if you are taking Losartan 50 mg daily.

## 2018-12-16 ENCOUNTER — Encounter: Payer: Self-pay | Admitting: Physician Assistant

## 2018-12-27 ENCOUNTER — Telehealth (HOSPITAL_COMMUNITY): Payer: Self-pay

## 2018-12-27 NOTE — Telephone Encounter (Signed)
I will attempt at a later time. Encounter complete.

## 2018-12-31 ENCOUNTER — Telehealth (HOSPITAL_COMMUNITY): Payer: Self-pay

## 2018-12-31 NOTE — Telephone Encounter (Signed)
Encounter complete. 

## 2019-01-01 ENCOUNTER — Ambulatory Visit (HOSPITAL_COMMUNITY)
Admission: RE | Admit: 2019-01-01 | Discharge: 2019-01-01 | Disposition: A | Discharge status: Home / Self Care | Payer: Medicare Other | Source: Ambulatory Visit | Attending: Cardiology | Admitting: Cardiology

## 2019-01-01 DIAGNOSIS — Z9189 Other specified personal risk factors, not elsewhere classified: Secondary | ICD-10-CM | POA: Diagnosis present

## 2019-01-01 DIAGNOSIS — R9431 Abnormal electrocardiogram [ECG] [EKG]: Secondary | ICD-10-CM | POA: Insufficient documentation

## 2019-01-01 LAB — EXERCISE TOLERANCE TEST
CHL CUP RESTING HR STRESS: 68 {beats}/min
Estimated workload: 8.5 METS
Exercise duration (min): 7 min
Exercise duration (sec): 0 s
MPHR: 144 {beats}/min
Peak HR: 126 {beats}/min
Percent HR: 87 %
RPE: 19

## 2019-01-02 NOTE — Progress Notes (Signed)
Normal treadmill stress test. No further workup.

## 2019-01-03 ENCOUNTER — Other Ambulatory Visit: Payer: Self-pay | Admitting: Cardiovascular Disease

## 2019-01-08 ENCOUNTER — Other Ambulatory Visit: Payer: Self-pay | Admitting: Cardiovascular Disease

## 2019-01-08 NOTE — Telephone Encounter (Signed)
Rx request sent to pharmacy.  

## 2019-04-09 ENCOUNTER — Other Ambulatory Visit: Payer: Self-pay | Admitting: Cardiovascular Disease

## 2019-04-09 NOTE — Telephone Encounter (Signed)
Simvastatin 20 mg refilled.

## 2019-05-26 ENCOUNTER — Other Ambulatory Visit: Payer: Self-pay | Admitting: Internal Medicine

## 2019-05-26 DIAGNOSIS — Z1231 Encounter for screening mammogram for malignant neoplasm of breast: Secondary | ICD-10-CM

## 2019-06-16 ENCOUNTER — Other Ambulatory Visit: Payer: Self-pay | Admitting: Podiatry

## 2019-06-16 ENCOUNTER — Other Ambulatory Visit: Payer: Self-pay

## 2019-06-16 ENCOUNTER — Ambulatory Visit (INDEPENDENT_AMBULATORY_CARE_PROVIDER_SITE_OTHER): Payer: Medicare Other

## 2019-06-16 ENCOUNTER — Encounter: Payer: Self-pay | Admitting: Podiatry

## 2019-06-16 ENCOUNTER — Ambulatory Visit: Payer: Medicare Other | Admitting: Podiatry

## 2019-06-16 DIAGNOSIS — M722 Plantar fascial fibromatosis: Secondary | ICD-10-CM

## 2019-06-16 DIAGNOSIS — G8929 Other chronic pain: Secondary | ICD-10-CM | POA: Diagnosis not present

## 2019-06-16 DIAGNOSIS — M79672 Pain in left foot: Secondary | ICD-10-CM | POA: Diagnosis not present

## 2019-06-16 NOTE — Patient Instructions (Signed)

## 2019-06-18 NOTE — Progress Notes (Signed)
Subjective:   Patient ID: Monica Hunt, female   DOB: 78 y.o.   MRN: [id number]   HPI 78 year old female presents the office today for concerns of left heel pain which is been ongoing for greater than 6 months.  She states that it hurts when she first gets up but she is been sitting for some time and stands back up.  Gets better with activity.  She is prediabetic and she states that is diet-controlled control.  The pain does not wake her up at night.  No numbness or tingling.  Review of Systems  All other systems reviewed and are negative.  Past Medical History:  Diagnosis Date  . Cataracts, bilateral   . Depression   . Diabetes mellitus without complication ([place])   . Hypertension     Past Surgical History:  Procedure Laterality Date  . ABDOMINAL HYSTERECTOMY     vaginal  . CARPAL TUNNEL RELEASE Right   . COLONOSCOPY WITH PROPOFOL N/A 11/17/2014   Procedure: COLONOSCOPY WITH PROPOFOL;  Surgeon: Garlan Fair, MD;  Location: [facility];  Service: Endoscopy;  Laterality: N/A;     Current Outpatient Medications:  .  amLODipine (NORVASC) 5 MG tablet, Take 1 tablet (5 mg total) by mouth daily., Disp: 90 tablet, Rfl: 2 .  Calcium Carb-Cholecalciferol (CALCIUM 500+D Hunt), Take 1 tablet by mouth daily., Disp: , Rfl:  .  Calcium Carbonate-Vitamin D (CALCIUM + D Hunt), Take 1 tablet by mouth daily., Disp: , Rfl:  .  cholecalciferol (VITAMIN D) 1000 UNITS tablet, Take 1,000 Units by mouth daily., Disp: , Rfl:  .  clonazePAM (KLONOPIN) 0.5 MG tablet, Take 0.5 mg by mouth 2 (two) times daily as needed for anxiety., Disp: , Rfl:  .  EPINEPHrine (EPIPEN 2-PAK) 0.3 mg/0.3 mL IJ SOAJ injection, Inject 0.3 mg into the muscle once., Disp: , Rfl:  .  furosemide (LASIX) 40 MG tablet, TAKE 1 TABLET BY MOUTH EVERY DAY, Disp: 90 tablet, Rfl: 2 .  IRON Hunt, Take by mouth., Disp: , Rfl:  .  methimazole (TAPAZOLE) 5 MG tablet, Take 5 mg by mouth daily. Take 1 and 1/2 tab alternating once a day, Disp: ,  Rfl:  .  metoprolol succinate (TOPROL-XL) 25 MG 24 hr tablet, Take 1 tablet (25 mg total) by mouth daily. Take with or immediately following a meal., Disp: 90 tablet, Rfl: 2 .  Multiple Vitamin (MULTIVITAMIN WITH MINERALS) TABS tablet, Take 1 tablet by mouth daily., Disp: , Rfl:  .  omega-3 acid ethyl esters (LOVAZA) 1 g capsule, Take 1 capsule (1 g total) by mouth 2 (two) times daily., Disp: 60 capsule, Rfl: 6 .  omeprazole (PRILOSEC) 40 MG capsule, Take 40 mg by mouth daily., Disp: , Rfl:  .  promethazine (PHENERGAN) 25 MG tablet, Take 25 mg by mouth every 6 (six) hours as needed for nausea or vomiting., Disp: , Rfl:  .  rOPINIRole (REQUIP) 3 MG tablet, Take 3 mg by mouth at bedtime. Take 1 tab 1 to 3 hours before bedtime, Disp: , Rfl:  .  simvastatin (ZOCOR) 20 MG tablet, TAKE 1 TABLET BY MOUTH EVERY DAY IN THE EVENING, Disp: 90 tablet, Rfl: 0 .  traZODone (DESYREL) 50 MG tablet, 50 mg. Take 1-2 tabs daily at bedtime, Disp: , Rfl:   Allergies  Allergen Reactions  . Other Anaphylaxis    Wasp sting        Objective:  Physical Exam  General: AAO x3, NAD  Dermatological: Skin is warm,  dry and supple bilateral. Nails x 10 are well manicured; remaining integument appears unremarkable at this time. There are no open sores, no preulcerative lesions, no rash or signs of infection present.  Vascular: Dorsalis Pedis artery and Posterior Tibial artery pedal pulses are 2/4 bilateral with immedate capillary fill time. Pedal hair growth present. No varicosities and no lower extremity edema present bilateral. There is no pain with calf compression, swelling, warmth, erythema.   Neruologic: Grossly intact via light touch bilateral. Protective threshold with Semmes Wienstein monofilament intact to all pedal sites bilateral.  Negative Tinel sign.  Musculoskeletal: Tenderness to palpation along the plantar medial tubercle of the calcaneus at the insertion of plantar fascia on the left foot. There is no  pain along the course of the plantar fascia within the arch of the foot. Plantar fascia appears to be intact. There is no pain with lateral compression of the calcaneus or pain with vibratory sensation. There is no pain along the course or insertion of the achilles tendon. No other areas of tenderness to bilateral lower extremities. Muscular strength 5/5 in all groups tested bilateral.  Gait: Unassisted, Nonantalgic.       Assessment:   78 year old female with left heel pain, plantar fasciitis     Plan:  -Treatment options discussed including all alternatives, risks, and complications -Etiology of symptoms were discussed -X-rays were obtained and reviewed with the patient.  No evidence of acute fracture or stress fracture. -Steroid injection performed.  See procedure note below. -Hold off on anti-inflammatories given kidney issues -Plantar fascial brace dispensed -Discussed stretching, icing daily -Shoe modifications and orthotics discussed  Procedure: Injection Tendon/Ligament Discussed alternatives, risks, complications and verbal consent was obtained.  Location: LEFT plantar fascia at the glabrous junction; medial approach. Skin Prep: Alcohol  Injectate: 0.5cc 0.5% marcaine plain, 0.5 cc 2% lidocaine plain and, 1 cc kenalog 10. Disposition: Patient tolerated procedure well. Injection site dressed with a band-aid.  Post-injection care was discussed and return precautions discussed.   Trula Slade DPM

## 2019-07-11 ENCOUNTER — Other Ambulatory Visit: Payer: Self-pay | Admitting: Cardiovascular Disease

## 2019-07-11 ENCOUNTER — Ambulatory Visit
Admission: RE | Admit: 2019-07-11 | Discharge: 2019-07-11 | Disposition: A | Discharge status: Home / Self Care | Payer: Medicare Other | Source: Ambulatory Visit | Attending: Internal Medicine | Admitting: Internal Medicine

## 2019-07-11 DIAGNOSIS — Z1231 Encounter for screening mammogram for malignant neoplasm of breast: Secondary | ICD-10-CM

## 2019-07-11 IMAGING — MG DIGITAL SCREENING BILATERAL MAMMOGRAM WITH TOMO AND CAD
6 of 10 series · 6 of 30 positions shown · non-contrast
Comparison: Previous exam(s).

CLINICAL DATA: Screening.

EXAM:
DIGITAL SCREENING BILATERAL MAMMOGRAM WITH TOMO AND CAD

[R MLO synth-2D]
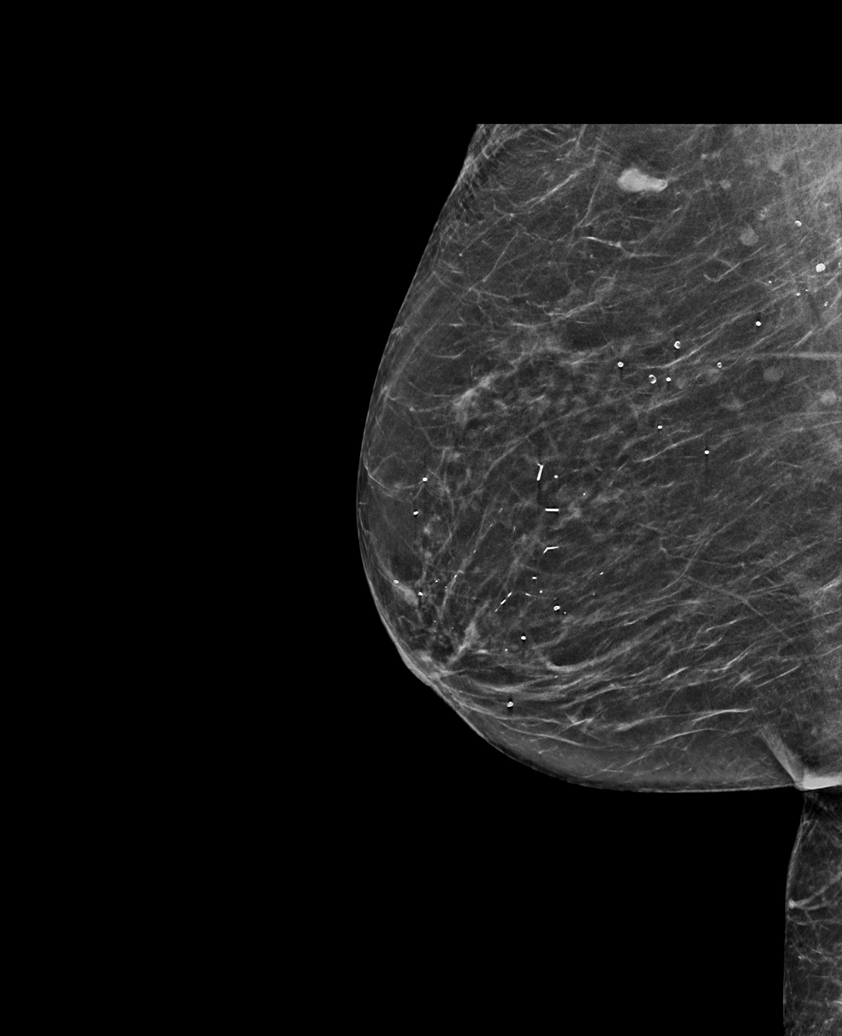

[L MLO synth-2D]
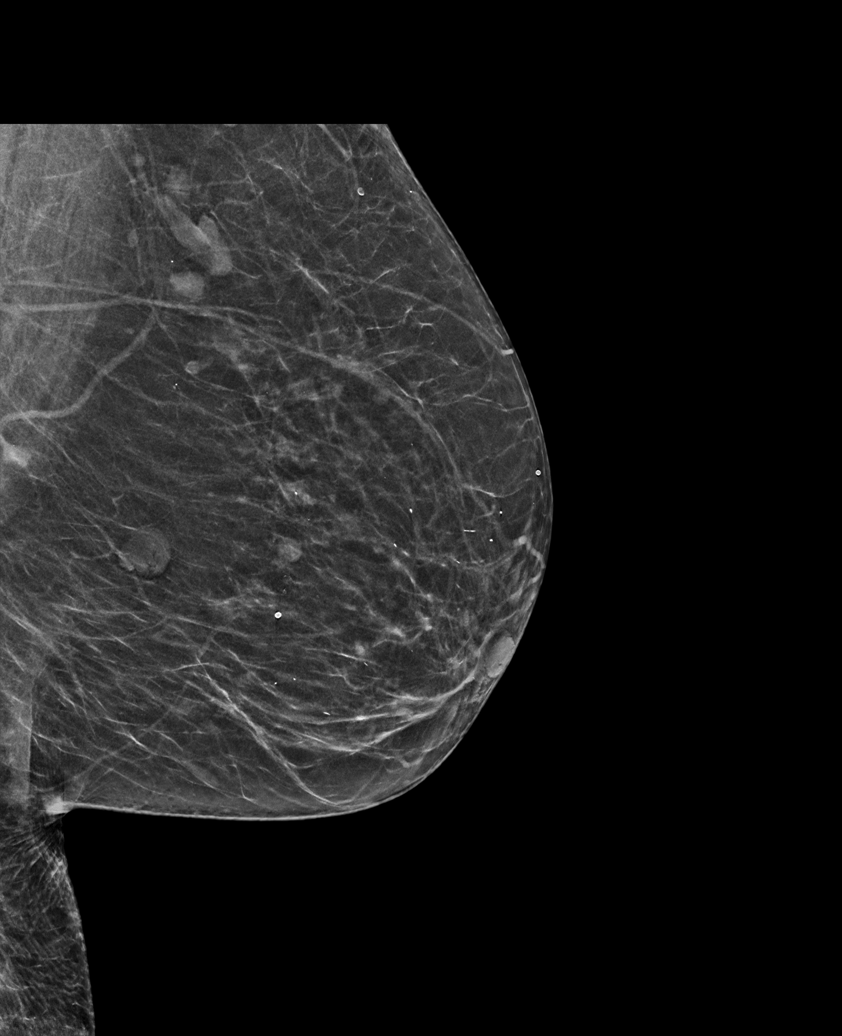

[L CC synth-2D]
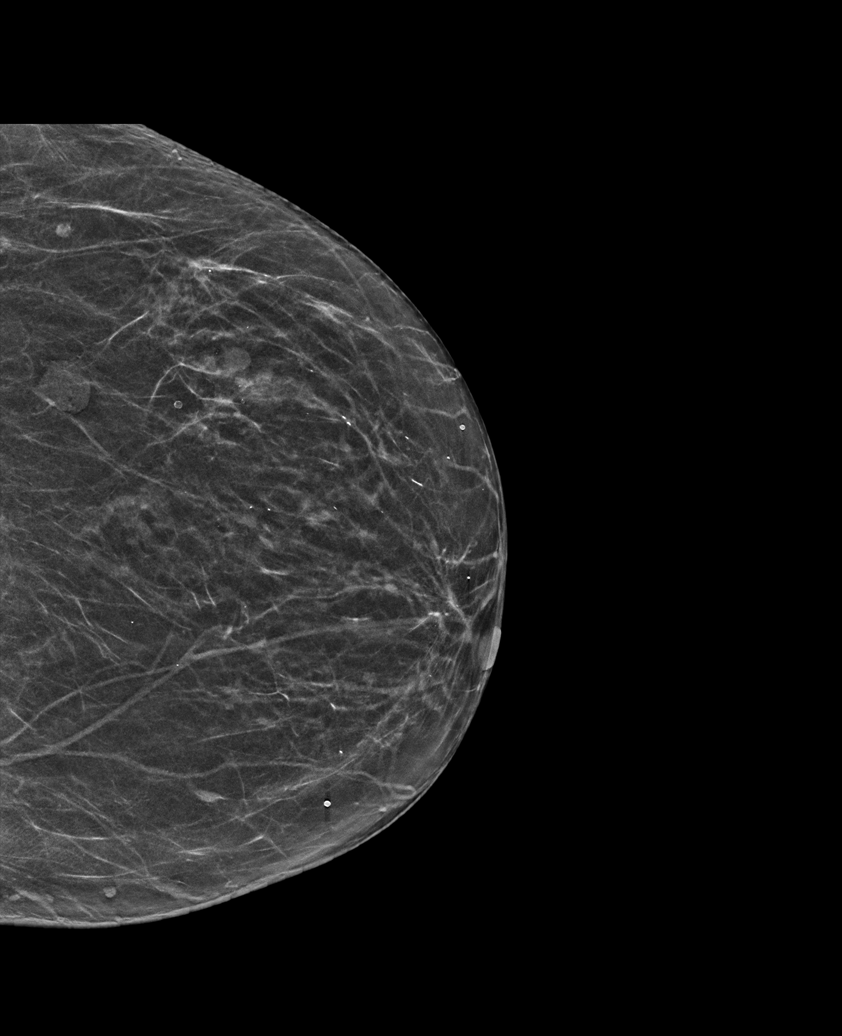

[R XCCL synth-2D]
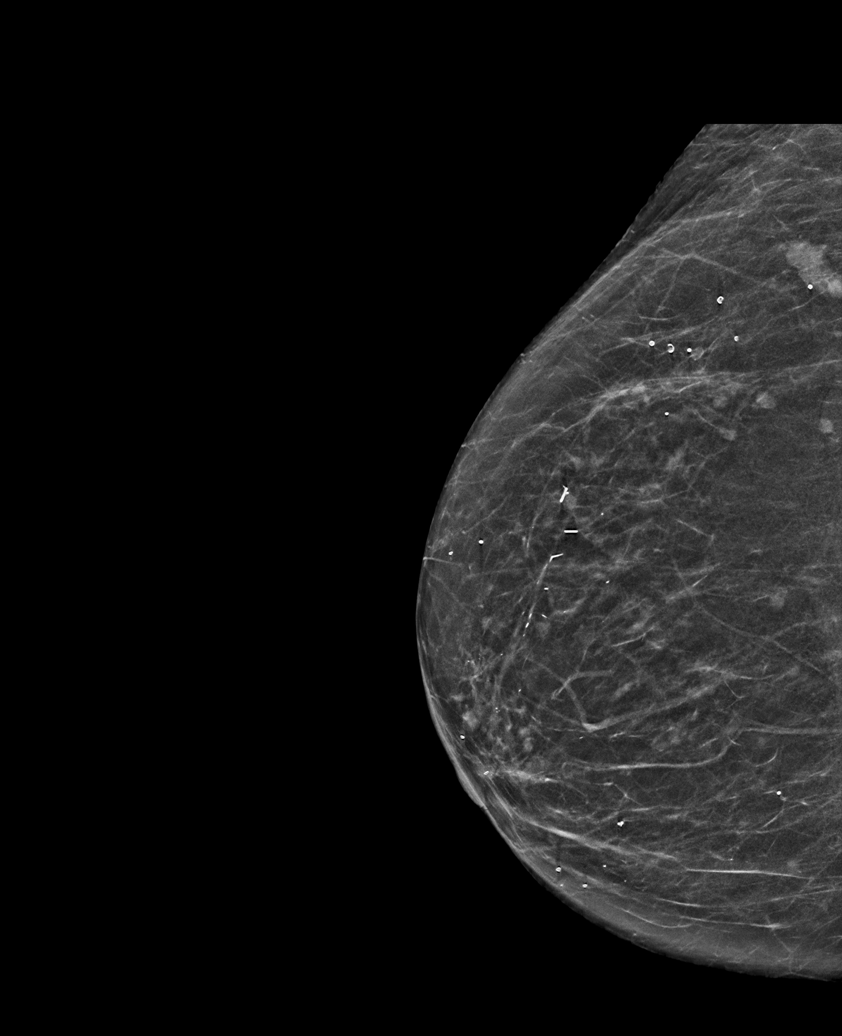

[R CC synth-2D]
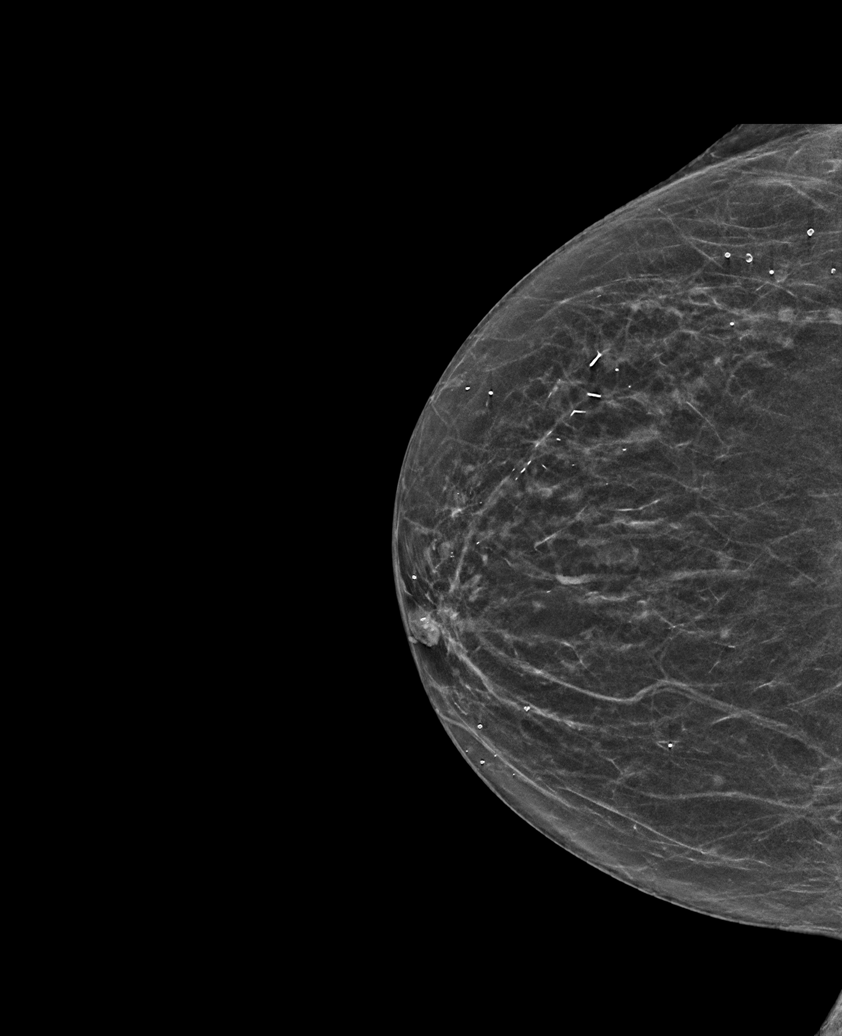

[L MLO tomo · tomo slice 31/61.0]
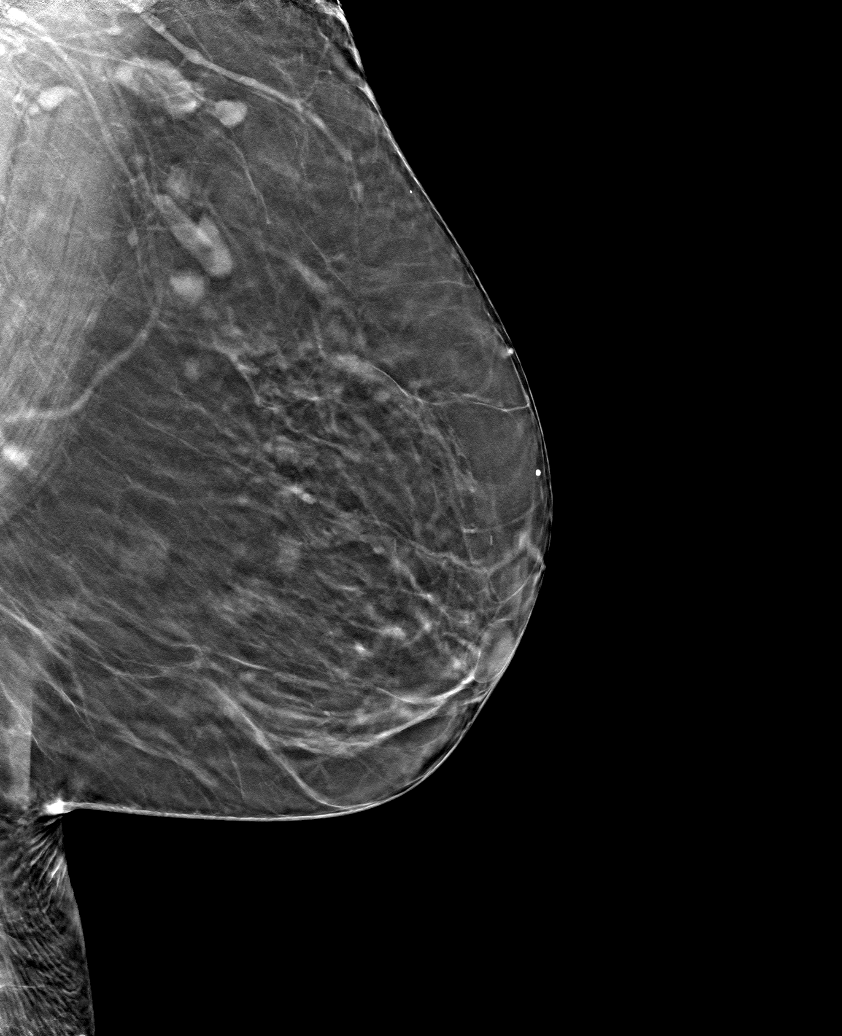

[6 of 30 positions shown; findings below may reference images not displayed]

ACR Breast Density Category b: There are scattered areas of
fibroglandular density.
FINDINGS: There are no findings suspicious for malignancy. Images were
processed with CAD.
IMPRESSION: No mammographic evidence of malignancy. A result letter of this
screening mammogram will be mailed directly to the patient.

RECOMMENDATION:
Screening mammogram in one year. (Code:[TQ])

BI-RADS CATEGORY  1: Negative.

## 2019-07-14 ENCOUNTER — Ambulatory Visit: Payer: Medicare Other | Admitting: Podiatry

## 2019-07-14 ENCOUNTER — Encounter: Payer: Self-pay | Admitting: Podiatry

## 2019-07-14 ENCOUNTER — Other Ambulatory Visit: Payer: Self-pay

## 2019-07-14 VITALS — Temp 98.9°F

## 2019-07-14 DIAGNOSIS — M722 Plantar fascial fibromatosis: Secondary | ICD-10-CM

## 2019-07-14 NOTE — Patient Instructions (Signed)
Look at getting a Rolena Infante or New Balance shoe   Plantar Fasciitis (Heel Spur Syndrome) with Rehab The plantar fascia is a fibrous, ligament-like, soft-tissue structure that spans the bottom of the foot. Plantar fasciitis is a condition that causes pain in the foot due to inflammation of the tissue. SYMPTOMS   Pain and tenderness on the underneath side of the foot.  Pain that worsens with standing or walking. CAUSES  Plantar fasciitis is caused by irritation and injury to the plantar fascia on the underneath side of the foot. Common mechanisms of injury include:  Direct trauma to bottom of the foot.  Damage to a small nerve that runs under the foot where the main fascia attaches to the heel bone.  Stress placed on the plantar fascia due to bone spurs. RISK INCREASES WITH:   Activities that place stress on the plantar fascia (running, jumping, pivoting, or cutting).  Poor strength and flexibility.  Improperly fitted shoes.  Tight calf muscles.  Flat feet.  Failure to warm-up properly before activity.  Obesity. PREVENTION  Warm up and stretch properly before activity.  Allow for adequate recovery between workouts.  Maintain physical fitness:  Strength, flexibility, and endurance.  Cardiovascular fitness.  Maintain a health body weight.  Avoid stress on the plantar fascia.  Wear properly fitted shoes, including arch supports for individuals who have flat feet.  PROGNOSIS  If treated properly, then the symptoms of plantar fasciitis usually resolve without surgery. However, occasionally surgery is necessary.  RELATED COMPLICATIONS   Recurrent symptoms that may result in a chronic condition.  Problems of the lower back that are caused by compensating for the injury, such as limping.  Pain or weakness of the foot during push-off following surgery.  Chronic inflammation, scarring, and partial or complete fascia tear, occurring more often from repeated injections.   TREATMENT  Treatment initially involves the use of ice and medication to help reduce pain and inflammation. The use of strengthening and stretching exercises may help reduce pain with activity, especially stretches of the Achilles tendon. These exercises may be performed at home or with a therapist. Your caregiver may recommend that you use heel cups of arch supports to help reduce stress on the plantar fascia. Occasionally, corticosteroid injections are given to reduce inflammation. If symptoms persist for greater than 6 months despite non-surgical (conservative), then surgery may be recommended.   MEDICATION   If pain medication is necessary, then nonsteroidal anti-inflammatory medications, such as aspirin and ibuprofen, or other minor pain relievers, such as acetaminophen, are often recommended.  Do not take pain medication within 7 days before surgery.  Prescription pain relievers may be given if deemed necessary by your caregiver. Use only as directed and only as much as you need.  Corticosteroid injections may be given by your caregiver. These injections should be reserved for the most serious cases, because they may only be given a certain number of times.  HEAT AND COLD  Cold treatment (icing) relieves pain and reduces inflammation. Cold treatment should be applied for 10 to 15 minutes every 2 to 3 hours for inflammation and pain and immediately after any activity that aggravates your symptoms. Use ice packs or massage the area with a piece of ice (ice massage).  Heat treatment may be used prior to performing the stretching and strengthening activities prescribed by your caregiver, physical therapist, or athletic trainer. Use a heat pack or soak the injury in warm water.  SEEK IMMEDIATE MEDICAL CARE IF:  Treatment seems  to offer no benefit, or the condition worsens.  Any medications produce adverse side effects.  EXERCISES- RANGE OF MOTION (ROM) AND STRETCHING EXERCISES - Plantar  Fasciitis (Heel Spur Syndrome) These exercises may help you when beginning to rehabilitate your injury. Your symptoms may resolve with or without further involvement from your physician, physical therapist or athletic trainer. While completing these exercises, remember:   Restoring tissue flexibility helps normal motion to return to the joints. This allows healthier, less painful movement and activity.  An effective stretch should be held for at least 30 seconds.  A stretch should never be painful. You should only feel a gentle lengthening or release in the stretched tissue.  RANGE OF MOTION - Toe Extension, Flexion  Sit with your right / left leg crossed over your opposite knee.  Grasp your toes and gently pull them back toward the top of your foot. You should feel a stretch on the bottom of your toes and/or foot.  Hold this stretch for 10 seconds.  Now, gently pull your toes toward the bottom of your foot. You should feel a stretch on the top of your toes and or foot.  Hold this stretch for 10 seconds. Repeat  times. Complete this stretch 3 times per day.   RANGE OF MOTION - Ankle Dorsiflexion, Active Assisted  Remove shoes and sit on a chair that is preferably not on a carpeted surface.  Place right / left foot under knee. Extend your opposite leg for support.  Keeping your heel down, slide your right / left foot back toward the chair until you feel a stretch at your ankle or calf. If you do not feel a stretch, slide your bottom forward to the edge of the chair, while still keeping your heel down.  Hold this stretch for 10 seconds. Repeat 3 times. Complete this stretch 2 times per day.   STRETCH  Gastroc, Standing  Place hands on wall.  Extend right / left leg, keeping the front knee somewhat bent.  Slightly point your toes inward on your back foot.  Keeping your right / left heel on the floor and your knee straight, shift your weight toward the wall, not allowing your  back to arch.  You should feel a gentle stretch in the right / left calf. Hold this position for 10 seconds. Repeat 3 times. Complete this stretch 2 times per day.  STRETCH  Soleus, Standing  Place hands on wall.  Extend right / left leg, keeping the other knee somewhat bent.  Slightly point your toes inward on your back foot.  Keep your right / left heel on the floor, bend your back knee, and slightly shift your weight over the back leg so that you feel a gentle stretch deep in your back calf.  Hold this position for 10 seconds. Repeat 3 times. Complete this stretch 2 times per day.  STRETCH  Gastrocsoleus, Standing  Note: This exercise can place a lot of stress on your foot and ankle. Please complete this exercise only if specifically instructed by your caregiver.   Place the ball of your right / left foot on a step, keeping your other foot firmly on the same step.  Hold on to the wall or a rail for balance.  Slowly lift your other foot, allowing your body weight to press your heel down over the edge of the step.  You should feel a stretch in your right / left calf.  Hold this position for 10 seconds.  Repeat this exercise with a slight bend in your right / left knee. Repeat 3 times. Complete this stretch 2 times per day.   STRENGTHENING EXERCISES - Plantar Fasciitis (Heel Spur Syndrome)  These exercises may help you when beginning to rehabilitate your injury. They may resolve your symptoms with or without further involvement from your physician, physical therapist or athletic trainer. While completing these exercises, remember:   Muscles can gain both the endurance and the strength needed for everyday activities through controlled exercises.  Complete these exercises as instructed by your physician, physical therapist or athletic trainer. Progress the resistance and repetitions only as guided.  STRENGTH - Towel Curls  Sit in a chair positioned on a non-carpeted surface.   Place your foot on a towel, keeping your heel on the floor.  Pull the towel toward your heel by only curling your toes. Keep your heel on the floor. Repeat 3 times. Complete this exercise 2 times per day.  STRENGTH - Ankle Inversion  Secure one end of a rubber exercise band/tubing to a fixed object (table, pole). Loop the other end around your foot just before your toes.  Place your fists between your knees. This will focus your strengthening at your ankle.  Slowly, pull your big toe up and in, making sure the band/tubing is positioned to resist the entire motion.  Hold this position for 10 seconds.  Have your muscles resist the band/tubing as it slowly pulls your foot back to the starting position. Repeat 3 times. Complete this exercises 2 times per day.  Document Released: 12/11/2005 Document Revised: 03/04/2012 Document Reviewed: 03/25/2009 [facility] Patient Information 2014 [place], [place].

## 2019-07-15 DIAGNOSIS — M722 Plantar fascial fibromatosis: Secondary | ICD-10-CM | POA: Insufficient documentation

## 2019-07-15 NOTE — Progress Notes (Signed)
Subjective: 78 year old female presents the office with a evaluation of left heel pain.  She states that overall she is doing better.  She states the injection to the couple days to become effective.  The brace does seem to help.  The pain is more intermittent.  No recent injury or falls.  No numbness or tingling, no weakness or falls. Denies any systemic complaints such as fevers, chills, nausea, vomiting. No acute changes since last appointment, and no other complaints at this time.   Objective: AAO x3, NAD DP/PT pulses palpable bilaterally, CRT less than 3 seconds There is no discomfort on the plantar aspect of the calcaneus insertion of plantar fashion the left heel.  Decreased fat pad present.  There is no pain with lateral compression of calcaneus.  No pain along the course or insertion of Achilles tendon.  No other areas of tenderness.  Negative Tinel sign.  No open lesions or pre-ulcerative lesions.  No pain with calf compression, swelling, warmth, erythema  Assessment: Left heel pain, plantar fasciitis/bone spur  Plan: -All treatment options discussed with the patient including all alternatives, risks, complications.  -I again reviewed the x-rays with her today.  I want her to continue with stretching, icing daily which she has not been doing.  Continue current fascial brace for now.  Dispensed a gel heel cup for her.  Discussed more supportive shoes, inserts.  She also hold off on another injection today and I agree given the decreased fat pad. -Patient encouraged to call the office with any questions, concerns, change in symptoms.   Trula Slade DPM

## 2019-10-07 ENCOUNTER — Other Ambulatory Visit: Payer: Self-pay | Admitting: Cardiovascular Disease

## 2020-01-02 ENCOUNTER — Ambulatory Visit: Payer: Medicare Other | Admitting: Physician Assistant

## 2020-01-08 ENCOUNTER — Encounter (INDEPENDENT_AMBULATORY_CARE_PROVIDER_SITE_OTHER): Payer: Self-pay

## 2020-01-08 ENCOUNTER — Ambulatory Visit: Payer: Medicare Other | Admitting: Physician Assistant

## 2020-01-08 ENCOUNTER — Encounter: Payer: Self-pay | Admitting: Physician Assistant

## 2020-01-08 ENCOUNTER — Other Ambulatory Visit: Payer: Self-pay

## 2020-01-08 VITALS — BP 190/90 | HR 52 | Temp 95.9°F | Ht 62.5 in | Wt 127.0 lb

## 2020-01-08 DIAGNOSIS — E119 Type 2 diabetes mellitus without complications: Secondary | ICD-10-CM

## 2020-01-08 DIAGNOSIS — R0789 Other chest pain: Secondary | ICD-10-CM | POA: Diagnosis not present

## 2020-01-08 DIAGNOSIS — E039 Hypothyroidism, unspecified: Secondary | ICD-10-CM

## 2020-01-08 DIAGNOSIS — N184 Chronic kidney disease, stage 4 (severe): Secondary | ICD-10-CM | POA: Diagnosis not present

## 2020-01-08 DIAGNOSIS — I10 Essential (primary) hypertension: Secondary | ICD-10-CM | POA: Diagnosis not present

## 2020-01-08 NOTE — Progress Notes (Addendum)
Cardiology Office Note:    Date:  01/10/2020   ID:  Monica Hunt, DOB [birth year], MRN [id number]  PCP:  Lavone Orn, MD  Cardiologist:  Sanda Klein, MD  Electrophysiologist:  None  Nephrology: Dr. Cassandria Santee  Referring MD: Lavone Orn, MD   Chief Complaint  Patient presents with  . Follow-up    seen for Dr. Sallyanne Kuster    History of Present Illness:    Monica Hunt is a 79 y.o. female with a hx of HTN, CKD, RLS, GERD, hypothyroidism and diabetes mellitus.  She had a previous rhythm monitor that showed persistent bradycardia.    Beta-blocker was reduced.  I last saw the patient in December 2019, she has significant concern for coronary artery disease at the time.  Patient eventually underwent exercise tolerance test on 01/01/2019 that was normal with rare PVCs.  She was only able to achieve 87% of maximum predicted heart rate.  Patient presents today for cardiology office visit.  Her blood pressure is quite elevated at 190/90.  She says either her PCP or nephrologist have taken her off of amlodipine.  The only blood pressure she is currently on is the metoprolol.  She is being followed by Dr. Corliss Parish of [facility].  We will request recent office visit note and lab work from both PCP and the nephrology service.  I did not try to restart her on the blood pressure medication as I need to determine why it was taken off in the first place.  She does complain of a pain under her right breast.  This is not worsening with deep inspiration, body rotation and palpation.  She noticed discomfort more so at rest than with exertion.  It typically last a few hours before going away.  Symptom of right-sided chest pain seems to be very atypical.  I recommended continue focus on blood pressure control at this time.  If chest pain still persist after blood pressure is under adequate control, then we can consider additional work-up.  However her last GXT in January 2020 was  reassuring.  Suspicion for ACS very low.  Past Medical History:  Diagnosis Date  . Cataracts, bilateral   . Depression   . Diabetes mellitus without complication ([place])   . Hypertension     Past Surgical History:  Procedure Laterality Date  . ABDOMINAL HYSTERECTOMY     vaginal  . CARPAL TUNNEL RELEASE Right   . COLONOSCOPY WITH PROPOFOL N/A 11/17/2014   Procedure: COLONOSCOPY WITH PROPOFOL;  Surgeon: Garlan Fair, MD;  Location: [facility];  Service: Endoscopy;  Laterality: N/A;    Current Medications: Current Meds  Medication Sig  . Calcium Carb-Cholecalciferol (CALCIUM 500+D PO) Take 1 tablet by mouth daily.  . Calcium Carbonate-Vitamin D (CALCIUM + D PO) Take 1 tablet by mouth daily.  . cholecalciferol (VITAMIN D) 1000 UNITS tablet Take 1,000 Units by mouth daily.  . clonazePAM (KLONOPIN) 0.5 MG tablet Take 0.5 mg by mouth 2 (two) times daily as needed for anxiety.  [name] EPINEPHrine (EPIPEN 2-PAK) 0.3 mg/0.3 mL IJ SOAJ injection Inject 0.3 mg into the muscle once.  . furosemide (LASIX) 40 MG tablet TAKE 1 TABLET BY MOUTH EVERY DAY  . IRON PO Take by mouth.  . methimazole (TAPAZOLE) 5 MG tablet Take 5 mg by mouth daily. Take 1 and 1/2 tab alternating once a day  . metoprolol succinate (TOPROL-XL) 25 MG 24 hr tablet Take 1 tablet (25 mg total) by mouth daily. Take  with or immediately following a meal.  . Multiple Vitamin (MULTIVITAMIN WITH MINERALS) TABS tablet Take 1 tablet by mouth daily.  [name] omega-3 acid ethyl esters (LOVAZA) 1 g capsule Take 1 capsule (1 g total) by mouth 2 (two) times daily.  [name] omeprazole (PRILOSEC) 40 MG capsule Take 40 mg by mouth daily.  . promethazine (PHENERGAN) 25 MG tablet Take 25 mg by mouth every 6 (six) hours as needed for nausea or vomiting.  [name] rOPINIRole (REQUIP) 3 MG tablet Take 3 mg by mouth at bedtime. Take 1 tab 1 to 3 hours before bedtime  . simvastatin (ZOCOR) 20 MG tablet TAKE 1 TABLET BY MOUTH EVERY DAY IN THE EVENING  . traZODone  (DESYREL) 50 MG tablet 50 mg. Take 1-2 tabs daily at bedtime     Allergies:   Other   Social History   Socioeconomic History  . Marital status: Divorced    Spouse name: Not on file  . Number of children: Not on file  . Years of education: Not on file  . Highest education level: Not on file  Occupational History  . Not on file  Tobacco Use  . Smoking status: Current Every Day Smoker    Packs/day: 0.50    Years: 45.00    Pack years: 22.50  . Smokeless tobacco: Never Used  Substance and Sexual Activity  . Alcohol use: No  . Drug use: No  . Sexual activity: Not on file  Other Topics Concern  . Not on file  Social History Narrative  . Not on file   Social Determinants of Health   Financial Resource Strain:   . Difficulty of Paying Living Expenses: Not on file  Food Insecurity:   . Worried About Charity fundraiser in the Last Year: Not on file  . Ran Out of Food in the Last Year: Not on file  Transportation Needs:   . Lack of Transportation (Medical): Not on file  . Lack of Transportation (Non-Medical): Not on file  Physical Activity:   . Days of Exercise per Week: Not on file  . Minutes of Exercise per Session: Not on file  Stress:   . Feeling of Stress : Not on file  Social Connections:   . Frequency of Communication with Friends and Family: Not on file  . Frequency of Social Gatherings with Friends and Family: Not on file  . Attends Religious Services: Not on file  . Active Member of Clubs or Organizations: Not on file  . Attends Archivist Meetings: Not on file  . Marital Status: Not on file     Family History: The patient's family history is not on file.  ROS:   Please see the history of present illness.     All other systems reviewed and are negative.  EKGs/Labs/Other Studies Reviewed:    The following studies were reviewed today:  Echo 05/31/2017 LV EF: 55% -    60%  ------------------------------------------------------------------- Indications:      Syncope (R55).  ------------------------------------------------------------------- History:   PMH:  CKD.  Risk factors:  Hypertension. Diabetes mellitus.  ------------------------------------------------------------------- Study Conclusions  - Left ventricle: The cavity size was normal. Systolic function was   normal. The estimated ejection fraction was in the range of 55%   to 60%. Wall motion was normal; there were no regional wall   motion abnormalities. There was an increased relative   contribution of atrial contraction to ventricular filling.   Doppler parameters are consistent with abnormal left ventricular  relaxation (grade 1 diastolic dysfunction). - Aortic valve: Trileaflet; normal thickness, mildly calcified   leaflets. - Atrial septum: There was increased thickness of the septum,   consistent with lipomatous hypertrophy. - Pulmonary arteries: PA peak pressure: 31 mm Hg (S).  EKG:  EKG is ordered today.  The ekg ordered today demonstrates normal sinus rhythm with poor R wave progression in anterior leads.  Recent Labs: No results found for requested labs within last 8760 hours.  Recent Lipid Panel No results found for: CHOL, TRIG, HDL, CHOLHDL, VLDL, LDLCALC, LDLDIRECT  Physical Exam:    VS:  BP (!) 190/90   Pulse (!) 52   Temp (!) 95.9 F (35.5 C)   Ht 5' 2.5" (1.588 m)   Wt 127 lb (57.6 kg)   BMI 22.86 kg/m     Wt Readings from Last 3 Encounters:  01/08/20 127 lb (57.6 kg)  12/13/18 143 lb 9.6 oz (65.1 kg)  07/17/17 158 lb (71.7 kg)     GEN:  Well nourished, well developed in no acute distress HEENT: Normal NECK: No JVD; No carotid bruits LYMPHATICS: No lymphadenopathy CARDIAC: RRR, no murmurs, rubs, gallops RESPIRATORY:  Clear to auscultation without rales, wheezing or rhonchi  ABDOMEN: Soft, non-tender, non-distended MUSCULOSKELETAL:  No edema; No  deformity  SKIN: Warm and dry NEUROLOGIC:  Alert and oriented x 3 PSYCHIATRIC:  Normal affect   ASSESSMENT:    1. Essential hypertension   2. CKD (chronic kidney disease) stage 4, GFR 15-29 ml/min (HCC)   3. Hypothyroidism, unspecified type   4. Controlled type 2 diabetes mellitus without complication, without long-term current use of insulin ([place])   5. Atypical chest pain    PLAN:    In order of problems listed above:  1. Hypertension: Blood pressure in the 190s today.  According to the patient, her other medication has been discontinued by either her PCP or nephrologist due to worsening renal function.  The only medications she is currently on is metoprolol.  I will request the record from her PCP and nephrology to see why other blood pressure medication was discontinued.  2. CKD stage IV: Apparently her renal function has worsened quite significantly over the past 2 years.  There has been talks about dialysis at this point.  We will request the record for nephrology service  3. Hypothyroidism, all: Managed by primary care provider  4. DM2: Managed by primary care provider  5. Atypical chest pain: She describes a right-sided chest pain, this does not correlate with the degree of exertion.  I recommended continue observation at this point and blood pressure management.  If her chest pain still persist after blood pressure become more stable, I would consider additional work-up.  Suspicion for ACS fairly low as patient had normal GXT last year.   Medication Adjustments/Labs and Tests Ordered: Current medicines are reviewed at length with the patient today.  Concerns regarding medicines are outlined above.  No orders of the defined types were placed in this encounter.  No orders of the defined types were placed in this encounter.   Patient Instructions  Medication Instructions:  Your physician recommends that you continue on your current medications as directed. Please refer to  the Current Medication list given to you today.  *If you need a refill on your cardiac medications before your next appointment, please call your pharmacy*  Lab Work: NONE ordered at this time of appointment   If you have labs (blood work) drawn today and your tests are  completely normal, you will receive your results only by: [name] MyChart Message (if you have MyChart) OR . A paper copy in the mail If you have any lab test that is abnormal or we need to change your treatment, we will call you to review the results.  Testing/Procedures: NONE ordered at this time of appointment   Follow-Up: At [facility], you and your health needs are our priority.  As part of our continuing mission to provide you with exceptional heart care, we have created designated Provider Care Teams.  These Care Teams include your primary Cardiologist (physician) and Advanced Practice Providers (APPs -  Physician Assistants and Nurse Practitioners) who all work together to provide you with the care you need, when you need it.  Your next appointment:   1-2 week(s)  The format for your next appointment:   In Person  Provider:   [name], PA-C  Other Instructions  MONITOR BLOOD PRESSURE AT HOME AT LEAST 2 READING [place] TO NEXT VISIT.     Hilbert Corrigan, [place]  01/10/2020 11:59 PM    [facility]

## 2020-01-08 NOTE — Patient Instructions (Addendum)
Medication Instructions:  Your physician recommends that you continue on your current medications as directed. Please refer to the Current Medication list given to you today.  *If you need a refill on your cardiac medications before your next appointment, please call your pharmacy*  Lab Work: NONE ordered at this time of appointment   If you have labs (blood work) drawn today and your tests are completely normal, you will receive your results only by: [name] MyChart Message (if you have MyChart) OR . A paper copy in the mail If you have any lab test that is abnormal or we need to change your treatment, we will call you to review the results.  Testing/Procedures: NONE ordered at this time of appointment   Follow-Up: At [facility], you and your health needs are our priority.  As part of our continuing mission to provide you with exceptional heart care, we have created designated Provider Care Teams.  These Care Teams include your primary Cardiologist (physician) and Advanced Practice Providers (APPs -  Physician Assistants and Nurse Practitioners) who all work together to provide you with the care you need, when you need it.  Your next appointment:   1-2 week(s)  The format for your next appointment:   In Person  Provider:   [name], PA-C  Other Instructions  MONITOR BLOOD PRESSURE AT HOME AT LEAST 2 READING [place] TO NEXT VISIT.

## 2020-01-10 ENCOUNTER — Encounter: Payer: Self-pay | Admitting: Physician Assistant

## 2020-01-13 NOTE — Addendum Note (Signed)
Addended by: Jacqulynn Cadet on: 01/13/2020 10:26 AM   Modules accepted: Orders

## 2020-01-22 ENCOUNTER — Ambulatory Visit: Payer: Medicare Other | Admitting: Family Medicine

## 2020-01-22 ENCOUNTER — Encounter: Payer: Self-pay | Admitting: Physician Assistant

## 2020-01-22 ENCOUNTER — Other Ambulatory Visit: Payer: Self-pay

## 2020-01-22 VITALS — BP 195/91 | HR 55 | Temp 98.4°F | Ht 62.5 in

## 2020-01-22 DIAGNOSIS — E119 Type 2 diabetes mellitus without complications: Secondary | ICD-10-CM

## 2020-01-22 DIAGNOSIS — I10 Essential (primary) hypertension: Secondary | ICD-10-CM

## 2020-01-22 DIAGNOSIS — E1122 Type 2 diabetes mellitus with diabetic chronic kidney disease: Secondary | ICD-10-CM

## 2020-01-22 DIAGNOSIS — N184 Chronic kidney disease, stage 4 (severe): Secondary | ICD-10-CM | POA: Diagnosis not present

## 2020-01-22 MED ORDER — HYDRALAZINE HCL 25 MG PO TABS: 25.0000 mg | ORAL_TABLET | Freq: Three times a day (TID) | ORAL | 0 refills | Status: DC

## 2020-01-22 NOTE — Patient Instructions (Signed)
Medication Instructions:   START HYDRALAZINE 25 MG 3 TIMES A DAY  *If you need a refill on your cardiac medications before your next appointment, please call your pharmacy*  Lab Work: NONE ordered at this time of appointment   If you have labs (blood work) drawn today and your tests are completely normal, you will receive your results only by: [name] MyChart Message (if you have MyChart) OR . A paper copy in the mail If you have any lab test that is abnormal or we need to change your treatment, we will call you to review the results.  Testing/Procedures: NONE ordered at this time of appointment   Follow-Up: At [facility], you and your health needs are our priority.  As part of our continuing mission to provide you with exceptional heart care, we have created designated Provider Care Teams.  These Care Teams include your primary Cardiologist (physician) and Advanced Practice Providers (APPs -  Physician Assistants and Nurse Practitioners) who all work together to provide you with the care you need, when you need it.  Your next appointment:   2 week(s)  The format for your next appointment:   In Person  Provider:   [name], PA-C  Other Instructions

## 2020-01-22 NOTE — Progress Notes (Addendum)
Cardiology Office Note  Date: 01/22/2020   ID: [name], [name] 05/30/1941, MRN [id number]  PCP:  Lavone Orn, MD  Cardiologist:  Sanda Klein, MD Electrophysiologist:  None   Chief Complaint  Patient presents with  . Follow-up    Hypertension    History of Present Illness: Monica Hunt is a 79 y.o. female recent encounter January 08, 2020 with [name] PA with hypertension.[name]  History of hypertension, stage IV chronic kidney disease, restless leg syndrome, hypothyroidism, type 2 diabetes, GERD.  History of bradycardia.  Had a recent rhythm monitor showing the same.  Her beta-blocker was reduced in dosage.  Patient had a concern previously about possible coronary artery disease.  She underwent exercise tolerance test May 02, 2019 which was normal achieving 87% of maximal predicted heart rate.  She follows with nephrology.  She was previously on amlodipine and was taken off the medication.  At last visit she was on metoprolol.  At that time there was uncertainty as to why amlodipine was stopped.  Notes were requested from other physicians managing the patient.  The last visit patient complained of pain under her right breast with no aggravating or alleviating factors or associated radiation, nausea, vomiting, dyspnea, diaphoresis.  Pain occurred at rest.  Previous treadmill stress in January 2020 was negative.  Patient states her nephrologist [name] her amlodipine due to low blood pressures during her visit.  Patient's blood pressure at last nephrology visit was 141/80.  Blood pressure today on arrival on our machine is 177/77.  Patient states she took her blood pressure this morning 195/91 on her device.  She has a log of blood pressures today with systolic blood pressures ranging from 130s to 202.  Notes from Nephrologist Corliss Parish ) office visit dated 11/27/2019 indicate amlodipine dose was reduced to 2.5 mg. Her Crt began to increase up to near 3 (2.99) Notes state her ARB  was stopped due to low BP. Patient states today that amlodipine was stopped along with lasix. Notes indicated she is only on Metoprolol and Lasix int their current medication list. Crt 2.50 and GFR 18 on that date.   Past Medical History:  Diagnosis Date  . Cataracts, bilateral   . Depression   . Diabetes mellitus without complication ([place])   . Hypertension     Past Surgical History:  Procedure Laterality Date  . ABDOMINAL HYSTERECTOMY     vaginal  . CARPAL TUNNEL RELEASE Right   . COLONOSCOPY WITH PROPOFOL N/A 11/17/2014   Procedure: COLONOSCOPY WITH PROPOFOL;  Surgeon: Garlan Fair, MD;  Location: [facility];  Service: Endoscopy;  Laterality: N/A;    Current Outpatient Medications  Medication Sig Dispense Refill  . ACCU-CHEK SMARTVIEW test strip     . cholecalciferol (VITAMIN D) 1000 UNITS tablet Take 1,000 Units by mouth daily.    . clonazePAM (KLONOPIN) 0.5 MG tablet Take 0.5 mg by mouth 2 (two) times daily as needed for anxiety.    [name] EPINEPHrine (EPIPEN 2-PAK) 0.3 mg/0.3 mL IJ SOAJ injection Inject 0.3 mg into the muscle once.    . IRON PO Take by mouth.    . methimazole (TAPAZOLE) 5 MG tablet Take 5 mg by mouth daily. Take 1 and 1/2 tab alternating once a day    . metoprolol succinate (TOPROL-XL) 25 MG 24 hr tablet Take 1 tablet (25 mg total) by mouth daily. Take with or immediately following a meal. 90 tablet 2  . Multiple Vitamin (MULTIVITAMIN WITH  MINERALS) TABS tablet Take 1 tablet by mouth daily.    [name] omega-3 acid ethyl esters (LOVAZA) 1 g capsule Take 1 capsule (1 g total) by mouth 2 (two) times daily. 60 capsule 6  . omeprazole (PRILOSEC) 40 MG capsule Take 40 mg by mouth daily.    . promethazine (PHENERGAN) 25 MG tablet Take 25 mg by mouth every 6 (six) hours as needed for nausea or vomiting.    [name] rOPINIRole (REQUIP) 3 MG tablet Take 3 mg by mouth at bedtime. Take 1 tab 1 to 3 hours before bedtime    . simvastatin (ZOCOR) 20 MG tablet TAKE 1 TABLET BY MOUTH EVERY  DAY IN THE EVENING 90 tablet 1  . traZODone (DESYREL) 50 MG tablet 50 mg. Take 1-2 tabs daily at bedtime    . amLODipine (NORVASC) 5 MG tablet Take 1 tablet (5 mg total) by mouth daily. (Patient not taking: Reported on 01/22/2020) 90 tablet 2  . furosemide (LASIX) 40 MG tablet TAKE 1 TABLET BY MOUTH EVERY DAY (Patient not taking: Reported on 01/22/2020) 90 tablet 2   No current facility-administered medications for this visit.   Allergies:  Other   Social History: The patient  reports that she has been smoking. She has a 22.50 pack-year smoking history. She has never used smokeless tobacco. She reports that she does not drink alcohol or use drugs.   Family History: The patient's family history is not on file.   ROS:  Please see the history of present illness. Otherwise, complete review of systems is positive for none.  All other systems are reviewed and negative.   Physical Exam: VS:  BP (!) 195/91 Comment: home B/P machine taken in office  Pulse (!) 55   Temp 98.4 F (36.9 C)   Ht 5' 2.5" (1.588 m)   SpO2 100%   BMI 22.86 kg/m , BMI Body mass index is 22.86 kg/m.  Wt Readings from Last 3 Encounters:  01/08/20 127 lb (57.6 kg)  12/13/18 143 lb 9.6 oz (65.1 kg)  07/17/17 158 lb (71.7 kg)    General: Patient appears comfortable at rest. HEENT: Conjunctiva and lids normal, oropharynx clear with moist mucosa. Neck: Supple, no elevated JVP or carotid bruits, no thyromegaly. Lungs: Clear to auscultation, nonlabored breathing at rest. Cardiac: Regular rate and rhythm, no S3 or significant systolic murmur, no pericardial rub. Abdomen: Soft, nontender, no hepatomegaly, bowel sounds present, no guarding or rebound. Extremities: No pitting edema, distal pulses 2+. Skin: Warm and dry. Musculoskeletal: No kyphosis. Neuropsychiatric: Alert and oriented x3, affect grossly appropriate.  ECG:  An ECG dated January 13, 2020 was personally reviewed today and demonstrated:  Sinus bradycardia rate  of 52.  Cannot rule out anterior infarct, age undetermined.  Recent Labwork: No results found for requested labs within last 8760 hours.  No results found for: CHOL, TRIG, HDL, CHOLHDL, VLDL, LDLCALC, LDLDIRECT  Other Studies Reviewed Today:  Echocardiogram on January 08, 2020 Study Conclusions  - Left ventricle: The cavity size was normal. Systolic function was normal. The estimated ejection fraction was in the range of 55% to 60%. Wall motion was normal; there were no regional wall motion abnormalities. There was an increased relative contribution of atrial contraction to ventricular filling. Doppler parameters are consistent with abnormal left ventricular relaxation (grade 1 diastolic dysfunction). - Aortic valve: Trileaflet; normal thickness, mildly calcified leaflets. - Atrial septum: There was increased thickness of the septum, consistent with lipomatous hypertrophy. - Pulmonary arteries: PA peak pressure: 31 mm  Hg (S). Assessment and Plan:  1. Essential hypertension   2. CKD (chronic kidney disease) stage 4, GFR 15-29 ml/min (HCC)   3. Controlled type 2 diabetes mellitus with stage 4 chronic kidney disease, without long-term current use of insulin ([place])   4. Controlled type 2 diabetes mellitus without complication, without long-term current use of insulin ([place])    1. Essential hypertension Blood pressure elevated today on arrival with a initial blood pressure of 177/77.  Patient's home blood pressure device registered 195/91.  She has a log over the last week or so with daily blood pressures ranging from systolic of high [id number] to [id number] systolic.  Start hydralazine 25 mg p.o. twice daily.  Take blood pressures daily.  Call if blood pressures are low and/or you feel dizzy.  We may need to adjust the medication..  Continue metoprolol extended release 25 mg daily.  2. CKD (chronic kidney disease) stage 4, GFR 15-29 ml/min (HCC) Patient has stage IV kidney disease.   She sees nephrology Dr. Clover Mealy.  At last visit patient's Lasix and amlodipine were discontinued.  Patient states nephrologist tell her she is nearing needing dialysis.  3. Controlled type 2 diabetes mellitus with stage 4 chronic kidney disease, without long-term current use of insulin ([place]) Patient states her recent hemoglobin A1c was around 7%.  She states usually her A1c is it is less than 7%.  Diabetes managed by PCP  Medication Adjustments/Labs and Tests Ordered: Current medicines are reviewed at length with the patient today.  Concerns regarding medicines are outlined above.    There are no Patient Instructions on file for this visit.       Signed, Levell July, NP 01/22/2020 2:15 PM    [facility] at [place], [place], [place] [address] Phone: [phone]; Fax: [phone]

## 2020-02-11 ENCOUNTER — Telehealth: Payer: Self-pay

## 2020-02-11 NOTE — Telephone Encounter (Signed)
Left a detailed message for the patient about rescheduling her 02/12/2020 appointment with [name], PA-C at 4:15PM to a date and next week and to give our office a call at [phone] and someone will be glad to assist her.

## 2020-02-12 ENCOUNTER — Ambulatory Visit: Payer: Medicare Other | Admitting: Physician Assistant

## 2020-02-19 ENCOUNTER — Ambulatory Visit: Payer: Medicare Other | Attending: Internal Medicine

## 2020-02-19 DIAGNOSIS — Z23 Encounter for immunization: Secondary | ICD-10-CM

## 2020-02-19 NOTE — Progress Notes (Signed)
   Covid-19 Vaccination Clinic  Name:  Monica Hunt    MRN: [id number] DOB: 1941-09-24  02/19/2020  [name] was observed post Covid-19 immunization for 15 minutes without incidence. She was provided with Vaccine Information Sheet and instruction to access the V-Safe system.   [name] was instructed to call 911 with any severe reactions post vaccine: [name] Difficulty breathing  . Swelling of your face and throat  . A fast heartbeat  . A bad rash all over your body  . Dizziness and weakness    Immunizations Administered    Name Date Dose VIS Date Route   Pfizer COVID-19 Vaccine 02/19/2020  2:04 PM 0.3 mL 12/05/2019 Intramuscular   Manufacturer: [place]   Lot: [id number]   [place]: [id number]

## 2020-02-20 ENCOUNTER — Ambulatory Visit: Payer: Medicare Other | Admitting: Physician Assistant

## 2020-02-20 ENCOUNTER — Encounter: Payer: Self-pay | Admitting: Physician Assistant

## 2020-02-20 ENCOUNTER — Other Ambulatory Visit: Payer: Self-pay

## 2020-02-20 VITALS — BP 156/68 | HR 63 | Ht 62.5 in | Wt 129.0 lb

## 2020-02-20 DIAGNOSIS — I10 Essential (primary) hypertension: Secondary | ICD-10-CM

## 2020-02-20 DIAGNOSIS — N184 Chronic kidney disease, stage 4 (severe): Secondary | ICD-10-CM | POA: Diagnosis not present

## 2020-02-20 DIAGNOSIS — E0590 Thyrotoxicosis, unspecified without thyrotoxic crisis or storm: Secondary | ICD-10-CM

## 2020-02-20 DIAGNOSIS — E119 Type 2 diabetes mellitus without complications: Secondary | ICD-10-CM | POA: Diagnosis not present

## 2020-02-20 MED ORDER — HYDRALAZINE HCL 10 MG PO TABS: 10.0000 mg | ORAL_TABLET | Freq: Three times a day (TID) | ORAL | 1 refills | Status: DC

## 2020-02-20 NOTE — Patient Instructions (Signed)
Medication Instructions:  DECREASE hydralazine to 10mg  three times daily CONTINUE other current medications  *If you need a refill on your cardiac medications before your next appointment, please call your pharmacy*   Follow-Up: At [facility], you and your health needs are our priority.  As part of our continuing mission to provide you with exceptional heart care, we have created designated Provider Care Teams.  These Care Teams include your primary Cardiologist (physician) and Advanced Practice Providers (APPs -  Physician Assistants and Nurse Practitioners) who all work together to provide you with the care you need, when you need it.  We recommend signing up for the patient portal called "MyChart".  Sign up information is provided on this After Visit Summary.  MyChart is used to connect with patients for Virtual Visits (Telemedicine).  Patients are able to view lab/test results, encounter notes, upcoming appointments, etc.  Non-urgent messages can be sent to your provider as well.   To learn more about what you can do with MyChart, go to [email or web address].    Your next appointment:   4 month(s)  The format for your next appointment:   In Person  Provider:   Sanda Klein, MD   Other Instructions

## 2020-02-20 NOTE — Progress Notes (Signed)
Cardiology Office Note:    Date:  02/22/2020   ID:  Monica Hunt, DOB May 04, 1941, MRN [id number]  PCP:  Lavone Orn, MD  Cardiologist:  Sanda Klein, MD  Electrophysiologist:  None  Nephrology: Dr. Cassandria Santee  Referring MD: Lavone Orn, MD   Chief Complaint  Patient presents with  . Follow-up    seen for Dr. Sallyanne Kuster    History of Present Illness:    Monica Hunt is a 79 y.o. female with a hx of HTN, CKD, RLS, GERD,hypothyroidism and diabetes mellitus. She had a previous rhythm monitor that showed persistent bradycardia.   Beta-blocker was reduced.  She underwent exercise tolerance test on 01/01/2019 that was normal with rare PVCs.  She was only able to achieve 87% of maximum predicted heart rate.  I last saw the patient in January 2021 at which time her blood pressure was 190/90.  She mentioned her nephrologist had taken her off of amlodipine.  The only blood pressure medication she was on was metoprolol.  Patient was last seen by Levell July on [id number], systolic blood pressure ranges between 130-200.  It was recommended she start on hydralazine 25 mg twice daily.  Patient presents today for cardiology office visit, her systolic blood pressure seems to range between 117-150s most of the time.  I am concerned as though occasional borderline blood pressure and its effect on her kidney.  I will scale back to hydralazine to 10 mg 3 times daily dosing.  Otherwise she seems to be doing quite well without any chest pain, shortness of breath, lower extremity edema, orthopnea or PND.  She just received her first dose of Pfizer vaccine yesterday on 02/19/2020.  She is due to receive her second dose on March 23.   Past Medical History:  Diagnosis Date  . Cataracts, bilateral   . Depression   . Diabetes mellitus without complication ([place])   . Hypertension     Past Surgical History:  Procedure Laterality Date  . ABDOMINAL HYSTERECTOMY     vaginal  . CARPAL TUNNEL RELEASE  Right   . COLONOSCOPY WITH PROPOFOL N/A 11/17/2014   Procedure: COLONOSCOPY WITH PROPOFOL;  Surgeon: Garlan Fair, MD;  Location: [facility];  Service: Endoscopy;  Laterality: N/A;    Current Medications: Current Meds  Medication Sig  . ACCU-CHEK SMARTVIEW test strip   . amLODipine (NORVASC) 5 MG tablet Take 1 tablet (5 mg total) by mouth daily.  . cholecalciferol (VITAMIN D) 1000 UNITS tablet Take 1,000 Units by mouth daily.  . clonazePAM (KLONOPIN) 0.5 MG tablet Take 0.5 mg by mouth 2 (two) times daily as needed for anxiety.  [name] EPINEPHrine (EPIPEN 2-PAK) 0.3 mg/0.3 mL IJ SOAJ injection Inject 0.3 mg into the muscle once.  . furosemide (LASIX) 40 MG tablet TAKE 1 TABLET BY MOUTH EVERY DAY  . IRON PO Take by mouth.  . methimazole (TAPAZOLE) 5 MG tablet Take 5 mg by mouth daily. Take 1 and 1/2 tab alternating once a day  . metoprolol succinate (TOPROL-XL) 25 MG 24 hr tablet Take 1 tablet (25 mg total) by mouth daily. Take with or immediately following a meal.  . Multiple Vitamin (MULTIVITAMIN WITH MINERALS) TABS tablet Take 1 tablet by mouth daily.  [name] omega-3 acid ethyl esters (LOVAZA) 1 g capsule Take 1 capsule (1 g total) by mouth 2 (two) times daily.  [name] omeprazole (PRILOSEC) 40 MG capsule Take 40 mg by mouth daily.  . promethazine (PHENERGAN) 25 MG tablet Take 25 mg  by mouth every 6 (six) hours as needed for nausea or vomiting.  [name] rOPINIRole (REQUIP) 3 MG tablet Take 3 mg by mouth at bedtime. Take 1 tab 1 to 3 hours before bedtime  . simvastatin (ZOCOR) 20 MG tablet TAKE 1 TABLET BY MOUTH EVERY DAY IN THE EVENING  . traZODone (DESYREL) 50 MG tablet 50 mg. Take 1-2 tabs daily at bedtime  . [DISCONTINUED] hydrALAZINE (APRESOLINE) 25 MG tablet Take 1 tablet (25 mg total) by mouth 3 (three) times daily.     Allergies:   Other   Social History   Socioeconomic History  . Marital status: Divorced    Spouse name: Not on file  . Number of children: Not on file  . Years of education:  Not on file  . Highest education level: Not on file  Occupational History  . Not on file  Tobacco Use  . Smoking status: Current Every Day Smoker    Packs/day: 0.50    Years: 45.00    Pack years: 22.50  . Smokeless tobacco: Never Used  Substance and Sexual Activity  . Alcohol use: No  . Drug use: No  . Sexual activity: Not on file  Other Topics Concern  . Not on file  Social History Narrative  . Not on file   Social Determinants of Health   Financial Resource Strain:   . Difficulty of Paying Living Expenses: Not on file  Food Insecurity:   . Worried About Charity fundraiser in the Last Year: Not on file  . Ran Out of Food in the Last Year: Not on file  Transportation Needs:   . Lack of Transportation (Medical): Not on file  . Lack of Transportation (Non-Medical): Not on file  Physical Activity:   . Days of Exercise per Week: Not on file  . Minutes of Exercise per Session: Not on file  Stress:   . Feeling of Stress : Not on file  Social Connections:   . Frequency of Communication with Friends and Family: Not on file  . Frequency of Social Gatherings with Friends and Family: Not on file  . Attends Religious Services: Not on file  . Active Member of Clubs or Organizations: Not on file  . Attends Archivist Meetings: Not on file  . Marital Status: Not on file     Family History: The patient's family history is not on file.  ROS:   Please see the history of present illness.     All other systems reviewed and are negative.  EKGs/Labs/Other Studies Reviewed:    The following studies were reviewed today:  ETT 01/01/2019  Blood pressure demonstrated a normal response to exercise.  There was no ST segment deviation noted during stress.   Normal ETT Rare PVCls with stress and in recovery Patient only achieved 87% of PMHR  EKG:  EKG is not ordered today.    Recent Labs: No results found for requested labs within last 8760 hours.  Recent Lipid Panel No  results found for: CHOL, TRIG, HDL, CHOLHDL, VLDL, LDLCALC, LDLDIRECT  Physical Exam:    VS:  BP (!) 156/68   Pulse 63   Ht 5' 2.5" (1.588 m)   Wt 129 lb (58.5 kg)   SpO2 98%   BMI 23.22 kg/m     Wt Readings from Last 3 Encounters:  02/20/20 129 lb (58.5 kg)  01/08/20 127 lb (57.6 kg)  12/13/18 143 lb 9.6 oz (65.1 kg)     GEN:  Well nourished, well developed in no acute distress HEENT: Normal NECK: No JVD; No carotid bruits LYMPHATICS: No lymphadenopathy CARDIAC: RRR, no murmurs, rubs, gallops RESPIRATORY:  Clear to auscultation without rales, wheezing or rhonchi  ABDOMEN: Soft, non-tender, non-distended MUSCULOSKELETAL:  No edema; No deformity  SKIN: Warm and dry NEUROLOGIC:  Alert and oriented x 3 PSYCHIATRIC:  Normal affect   ASSESSMENT:    1. Essential hypertension   2. Controlled type 2 diabetes mellitus without complication, without long-term current use of insulin ([place])   3. Hyperthyroidism   4. Chronic kidney disease (CKD), stage IV (severe) ([facility])    PLAN:    In order of problems listed above:  1. Hypertension: Systolic blood pressure ranges between 110-150s, given renal issue, I decided to scaled the hydralazine back to 10 mg 3 times daily to allow baseline blood pressure to be in the 1 20-1 50s range.  2. DM2: Managed by primary care provider  3. Hyperthyroidism: On methimazole  4. CKD stage IV: Followed by Dr. Moshe Cipro   Medication Adjustments/Labs and Tests Ordered: Current medicines are reviewed at length with the patient today.  Concerns regarding medicines are outlined above.  No orders of the defined types were placed in this encounter.  Meds ordered this encounter  Medications  . hydrALAZINE (APRESOLINE) 10 MG tablet    Sig: Take 1 tablet (10 mg total) by mouth 3 (three) times daily.    Dispense:  270 tablet    Refill:  1    Patient Instructions  Medication Instructions:  DECREASE hydralazine to 10mg  three times daily CONTINUE  other current medications  *If you need a refill on your cardiac medications before your next appointment, please call your pharmacy*   Follow-Up: At [facility], you and your health needs are our priority.  As part of our continuing mission to provide you with exceptional heart care, we have created designated Provider Care Teams.  These Care Teams include your primary Cardiologist (physician) and Advanced Practice Providers (APPs -  Physician Assistants and Nurse Practitioners) who all work together to provide you with the care you need, when you need it.  We recommend signing up for the patient portal called "MyChart".  Sign up information is provided on this After Visit Summary.  MyChart is used to connect with patients for Virtual Visits (Telemedicine).  Patients are able to view lab/test results, encounter notes, upcoming appointments, etc.  Non-urgent messages can be sent to your provider as well.   To learn more about what you can do with MyChart, go to [email or web address].    Your next appointment:   4 month(s)  The format for your next appointment:   In Person  Provider:   Sanda Klein, MD   Other Instructions      Signed, [name], [place]  02/22/2020 11:51 PM    [place]

## 2020-02-22 ENCOUNTER — Encounter: Payer: Self-pay | Admitting: Physician Assistant

## 2020-03-16 ENCOUNTER — Ambulatory Visit: Payer: Medicare Other | Attending: Internal Medicine

## 2020-03-16 DIAGNOSIS — Z23 Encounter for immunization: Secondary | ICD-10-CM

## 2020-03-16 NOTE — Progress Notes (Signed)
   Covid-19 Vaccination Clinic  Name:  Monica Hunt    MRN: [id number] DOB: August 16, 1941  03/16/2020  [name] was observed post Covid-19 immunization for 15 minutes without incident. She was provided with Vaccine Information Sheet and instruction to access the V-Safe system.   [name] was instructed to call 911 with any severe reactions post vaccine: [name] Difficulty breathing  . Swelling of face and throat  . A fast heartbeat  . A bad rash all over body  . Dizziness and weakness   Immunizations Administered    Name Date Dose VIS Date Route   Pfizer COVID-19 Vaccine 03/16/2020  1:48 PM 0.3 mL 12/05/2019 Intramuscular   Manufacturer: [place]   Lot: [id number]   [place]: [id number]

## 2020-04-01 ENCOUNTER — Other Ambulatory Visit: Payer: Self-pay | Admitting: Cardiovascular Disease

## 2020-04-11 ENCOUNTER — Other Ambulatory Visit: Payer: Self-pay | Admitting: Family Medicine

## 2020-04-11 DIAGNOSIS — I10 Essential (primary) hypertension: Secondary | ICD-10-CM

## 2020-06-01 ENCOUNTER — Other Ambulatory Visit: Payer: Self-pay | Admitting: Internal Medicine

## 2020-06-01 DIAGNOSIS — Z1231 Encounter for screening mammogram for malignant neoplasm of breast: Secondary | ICD-10-CM

## 2020-06-02 ENCOUNTER — Ambulatory Visit: Payer: Medicare Other | Admitting: Cardiovascular Disease

## 2020-06-02 ENCOUNTER — Other Ambulatory Visit: Payer: Self-pay

## 2020-06-02 ENCOUNTER — Encounter: Payer: Self-pay | Admitting: Cardiovascular Disease

## 2020-06-02 VITALS — BP 111/63 | HR 62 | Ht 62.5 in | Wt 122.8 lb

## 2020-06-02 DIAGNOSIS — E1122 Type 2 diabetes mellitus with diabetic chronic kidney disease: Secondary | ICD-10-CM

## 2020-06-02 DIAGNOSIS — I10 Essential (primary) hypertension: Secondary | ICD-10-CM

## 2020-06-02 DIAGNOSIS — R55 Syncope and collapse: Secondary | ICD-10-CM

## 2020-06-02 DIAGNOSIS — E0590 Thyrotoxicosis, unspecified without thyrotoxic crisis or storm: Secondary | ICD-10-CM

## 2020-06-02 DIAGNOSIS — N184 Chronic kidney disease, stage 4 (severe): Secondary | ICD-10-CM | POA: Diagnosis not present

## 2020-06-02 NOTE — Patient Instructions (Signed)
Medication Instructions:  STOP- Hydralazine  *If you need a refill on your cardiac medications before your next appointment, please call your pharmacy*   Lab Work: None ordered   Testing/Procedures: None ordered   Follow-Up: At [facility], you and your health needs are our priority.  As part of our continuing mission to provide you with exceptional heart care, we have created designated Provider Care Teams.  These Care Teams include your primary Cardiologist (physician) and Advanced Practice Providers (APPs -  Physician Assistants and Nurse Practitioners) who all work together to provide you with the care you need, when you need it.  We recommend signing up for the patient portal called "MyChart".  Sign up information is provided on this After Visit Summary.  MyChart is used to connect with patients for Virtual Visits (Telemedicine).  Patients are able to view lab/test results, encounter notes, upcoming appointments, etc.  Non-urgent messages can be sent to your provider as well.   To learn more about what you can do with MyChart, go to [email or web address].    Your next appointment:   1 year(s)  The format for your next appointment:   In Person  Provider:   You may see Sanda Klein, MD or one of the following Advanced Practice Providers on your designated Care Team:    [name], PA-C  Fabian Sharp, PA-C or   Roby Lofts, [place]

## 2020-06-02 NOTE — Progress Notes (Signed)
Cardiology Office Note:    Date:  06/06/2020   ID:  Monica Hunt, DOB 04-15-1941, MRN [id number]  PCP:  Lavone Orn, MD  Cardiologist:  Sanda Klein, MD    Referring MD: Lavone Orn, MD   Chief Complaint  Patient presents with  . Fatigue   History of Present Illness:    Monica Hunt is a 79 y.o. female with a hx of Diabetes mellitus, chronic kidney disease (Dr. Moshe Cipro), hypertension, hyperthyroidism. She has had "Irritable bowel syndrome" for several years and continues to have problems with intermittent diarrhea.  She has had several syncopal events in the past, but none since we reduced the dose of her her beta-blocker.  Her previous rhythm monitor showed persistent bradycardia.  She is only been taking her loop diuretic every other day and has not had problems with dyspnea or edema.  She does complain of a lot of fatigue.  She has not had dizziness.  The patient specifically denies any chest pain at rest or with exertion, orthopnea, paroxysmal nocturnal dyspnea, syncope, palpitations, focal neurological deficits, intermittent claudication, lower extremity edema, unexplained weight gain, cough, hemoptysis or wheezing.  Additional comorbidities include diabetes mellitus with polyneuropathy and chronic kidney disease stage IV as well as hypertension, restless leg syndrome, acid reflux, irritable bowel syndrome, hyperthyroidism on chronic methimazole.  She has an appointment scheduled next week with Dr. Moshe Cipro when she had blood work done.  Her most recent creatinine was 2.7 on February 04, 2020 her hemoglobin was 11.1 at that time.  Her TSH was 0.89 in December of 2020.  Past Medical History:  Diagnosis Date  . Cataracts, bilateral   . Depression   . Diabetes mellitus without complication ([place])   . Hypertension     Past Surgical History:  Procedure Laterality Date  . ABDOMINAL HYSTERECTOMY     vaginal  . CARPAL TUNNEL RELEASE Right   . COLONOSCOPY WITH  PROPOFOL N/A 11/17/2014   Procedure: COLONOSCOPY WITH PROPOFOL;  Surgeon: Garlan Fair, MD;  Location: [facility];  Service: Endoscopy;  Laterality: N/A;    Current Medications: Current Meds  Medication Sig  . ACCU-CHEK SMARTVIEW test strip   . amLODipine (NORVASC) 5 MG tablet Take 1 tablet (5 mg total) by mouth daily.  . cholecalciferol (VITAMIN D) 1000 UNITS tablet Take 1,000 Units by mouth daily.  . clonazePAM (KLONOPIN) 0.5 MG tablet Take 0.5 mg by mouth 2 (two) times daily as needed for anxiety.  [name] EPINEPHrine (EPIPEN 2-PAK) 0.3 mg/0.3 mL IJ SOAJ injection Inject 0.3 mg into the muscle once.  . furosemide (LASIX) 40 MG tablet TAKE 1 TABLET BY MOUTH EVERY DAY  . IRON PO Take by mouth.  . methimazole (TAPAZOLE) 5 MG tablet Take 5 mg by mouth daily. Take 1 and 1/2 tab alternating once a day  . metoprolol succinate (TOPROL-XL) 25 MG 24 hr tablet Take 1 tablet (25 mg total) by mouth daily. Take with or immediately following a meal.  . Multiple Vitamin (MULTIVITAMIN WITH MINERALS) TABS tablet Take 1 tablet by mouth daily.  [name] omega-3 acid ethyl esters (LOVAZA) 1 g capsule Take 1 capsule (1 g total) by mouth 2 (two) times daily.  [name] omeprazole (PRILOSEC) 40 MG capsule Take 40 mg by mouth daily.  . promethazine (PHENERGAN) 25 MG tablet Take 25 mg by mouth every 6 (six) hours as needed for nausea or vomiting.  [name] rOPINIRole (REQUIP) 3 MG tablet Take 3 mg by mouth at bedtime. Take 1 tab 1 to  3 hours before bedtime  . simvastatin (ZOCOR) 20 MG tablet TAKE 1 TABLET BY MOUTH EVERY DAY IN THE EVENING  . traZODone (DESYREL) 50 MG tablet 50 mg. Take 1-2 tabs daily at bedtime  . [DISCONTINUED] hydrALAZINE (APRESOLINE) 10 MG tablet Take 1 tablet (10 mg total) by mouth 3 (three) times daily.     Allergies:   Other   Social History   Socioeconomic History  . Marital status: Divorced    Spouse name: Not on file  . Number of children: Not on file  . Years of education: Not on file  . Highest  education level: Not on file  Occupational History  . Not on file  Tobacco Use  . Smoking status: Current Every Day Smoker    Packs/day: 0.50    Years: 45.00    Pack years: 22.50  . Smokeless tobacco: Never Used  Substance and Sexual Activity  . Alcohol use: No  . Drug use: No  . Sexual activity: Not on file  Other Topics Concern  . Not on file  Social History Narrative  . Not on file   Social Determinants of Health   Financial Resource Strain:   . Difficulty of Paying Living Expenses:   Food Insecurity:   . Worried About Charity fundraiser in the Last Year:   . Arboriculturist in the Last Year:   Transportation Needs:   . Film/video editor (Medical):   [name] Lack of Transportation (Non-Medical):   Physical Activity:   . Days of Exercise per Week:   . Minutes of Exercise per Session:   Stress:   . Feeling of Stress :   Social Connections:   . Frequency of Communication with Friends and Family:   . Frequency of Social Gatherings with Friends and Family:   . Attends Religious Services:   . Active Member of Clubs or Organizations:   . Attends Archivist Meetings:   [name] Marital Status:      Family History: The patient's Family history significant for lung cancer in her father who died when he was only 66 years old, renal cancer in her mother who died at age 53. She has a brother who died at age 44 from kidney failure. Her daughter also has hyperthyroidism. ROS:   Please see the history of present illness.    All other systems are reviewed and are negative.  EKGs/Labs/Other Studies Reviewed:     EKG:  EKG is not ordered today.  Tracing from 01/08/2020 shows sinus bradycardia with delayed R wave progression  Recent Labs: BMET No results found for: NA, K, CL, CO2, GLUCOSE, BUN, CREATININE, CALCIUM, GFRNONAA, GFRAA 02/04/2020  hemoglobin 11.1, creatinine 2.7 12/11/2019 TSH 0.89, A1c 5.7% Lipid Panel  No results found for: CHOL, TRIG, HDL, CHOLHDL, VLDL,  LDLCALC, LDLDIRECT, LABVLDL 05/17/2018  total cholesterol 151, HDL 30, LDL 73, triglycerides 241   Physical Exam:    VS:  BP 111/63   Pulse 62   Ht 5' 2.5" (1.588 m)   Wt 122 lb 12.8 oz (55.7 kg)   SpO2 98%   BMI 22.10 kg/m     Wt Readings from Last 3 Encounters:  06/02/20 122 lb 12.8 oz (55.7 kg)  02/20/20 129 lb (58.5 kg)  01/08/20 127 lb (57.6 kg)    General: Alert, oriented x3, no distress, lean Head: no evidence of trauma, PERRL, EOMI, no exophtalmos or lid lag, no myxedema, no xanthelasma; normal ears, nose and oropharynx Neck: normal  jugular venous pulsations and no hepatojugular reflux; brisk carotid pulses without delay and no carotid bruits Chest: clear to auscultation, no signs of consolidation by percussion or palpation, normal fremitus, symmetrical and full respiratory excursions Cardiovascular: normal position and quality of the apical impulse, regular rhythm, normal first and second heart sounds, 2/6 early peaking systolic ejection murmur heard best in the aortic focus no diastolic murmurs, rubs or gallops Abdomen: no tenderness or distention, no masses by palpation, no abnormal pulsatility or arterial bruits, normal bowel sounds, no hepatosplenomegaly Extremities: no clubbing, cyanosis or edema; 2+ radial, ulnar and brachial pulses bilaterally; 2+ right femoral, posterior tibial and dorsalis pedis pulses; 2+ left femoral, posterior tibial and dorsalis pedis pulses; no subclavian or femoral bruits Neurological: grossly nonfocal Psych: Normal mood and affect   ASSESSMENT:    1. Vasovagal syncope   2. Controlled type 2 diabetes mellitus with stage 4 chronic kidney disease, without long-term current use of insulin ([place])   3. Chronic kidney disease (CKD), stage IV (severe) ([place])   4. Essential hypertension   5. Hyperthyroidism    PLAN:    In order of problems listed above:  1. Syncope: Currently asymptomatic, although she does have fatigue.  Denies orthostatic  dizziness.  Probably vagal events, potentiated by excessive baseline bradycardia on beta blocker. No recurrence after lowering the beta blocker dose. 2. DM: recent A1c 5.7%.  Will control without any medication, like the due to weight loss but also worried that it may be a sign of worsening renal 3. CKD 4: Has an appoint with Dr. Moshe Cipro next week.  Most recent creatinine that I have available is up to 2.7.  She is taking furosemide 20 every other day.  Even so, I am worried that she may become hypovolemic when she has her bouts of diarrhea.  I have recommended holding the furosemide when she has more than 2 or 3 bowel movements a day. 4.  HTN: BP is substantially lower, again attributable to weight loss.  Stop hydralazine.  Continue amlodipine 5 mg daily and metoprolol succinate 25 mg once daily. 5.  Hyperthyroidism: Euthyroid on methimazole therapy. 6.  Hypercholesterolemia: On statin, labs followed by PCP.    Medication Adjustments/Labs and Tests Ordered: Current medicines are reviewed at length with the patient today.  Concerns regarding medicines are outlined above. Labs and tests ordered and medication changes are outlined in the patient instructions below:  Patient Instructions  Medication Instructions:  STOP- Hydralazine  *If you need a refill on your cardiac medications before your next appointment, please call your pharmacy*   Lab Work: None ordered   Testing/Procedures: None ordered   Follow-Up: At [facility], you and your health needs are our priority.  As part of our continuing mission to provide you with exceptional heart care, we have created designated Provider Care Teams.  These Care Teams include your primary Cardiologist (physician) and Advanced Practice Providers (APPs -  Physician Assistants and Nurse Practitioners) who all work together to provide you with the care you need, when you need it.  We recommend signing up for the patient portal called "MyChart".   Sign up information is provided on this After Visit Summary.  MyChart is used to connect with patients for Virtual Visits (Telemedicine).  Patients are able to view lab/test results, encounter notes, upcoming appointments, etc.  Non-urgent messages can be sent to your provider as well.   To learn more about what you can do with MyChart, go to [email or web address].  Your next appointment:   1 year(s)  The format for your next appointment:   In Person  Provider:   You may see Sanda Klein, MD or one of the following Advanced Practice Providers on your designated Care Team:    [name], PA-C  Fabian Sharp, [place] or   Roby Lofts, PA-C       Signed, Sanda Klein, MD  06/06/2020 10:20 AM    [place]

## 2020-06-06 ENCOUNTER — Encounter: Payer: Self-pay | Admitting: Cardiovascular Disease

## 2020-06-17 ENCOUNTER — Other Ambulatory Visit: Payer: Self-pay | Admitting: Internal Medicine

## 2020-06-17 DIAGNOSIS — M8580 Other specified disorders of bone density and structure, unspecified site: Secondary | ICD-10-CM

## 2020-07-12 ENCOUNTER — Ambulatory Visit
Admission: RE | Admit: 2020-07-12 | Discharge: 2020-07-12 | Disposition: A | Discharge status: Home / Self Care | Payer: Medicare Other | Source: Ambulatory Visit | Attending: Internal Medicine | Admitting: Internal Medicine

## 2020-07-12 ENCOUNTER — Other Ambulatory Visit: Payer: Self-pay

## 2020-07-12 DIAGNOSIS — Z1231 Encounter for screening mammogram for malignant neoplasm of breast: Secondary | ICD-10-CM

## 2020-07-12 IMAGING — MG DIGITAL SCREENING BILAT W/ TOMO W/ CAD
8 series · 8 of 24 positions shown · non-contrast
Comparison: Previous exam(s).

CLINICAL DATA: Screening.

EXAM:
DIGITAL SCREENING BILATERAL MAMMOGRAM WITH TOMO AND CAD

[L CC synth-2D]
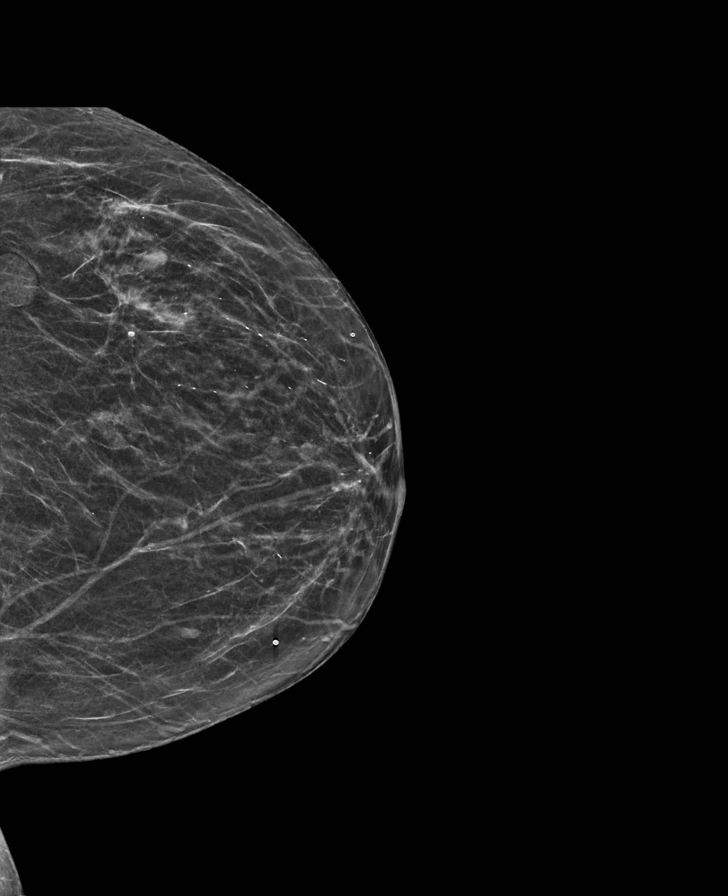

[R CC synth-2D]
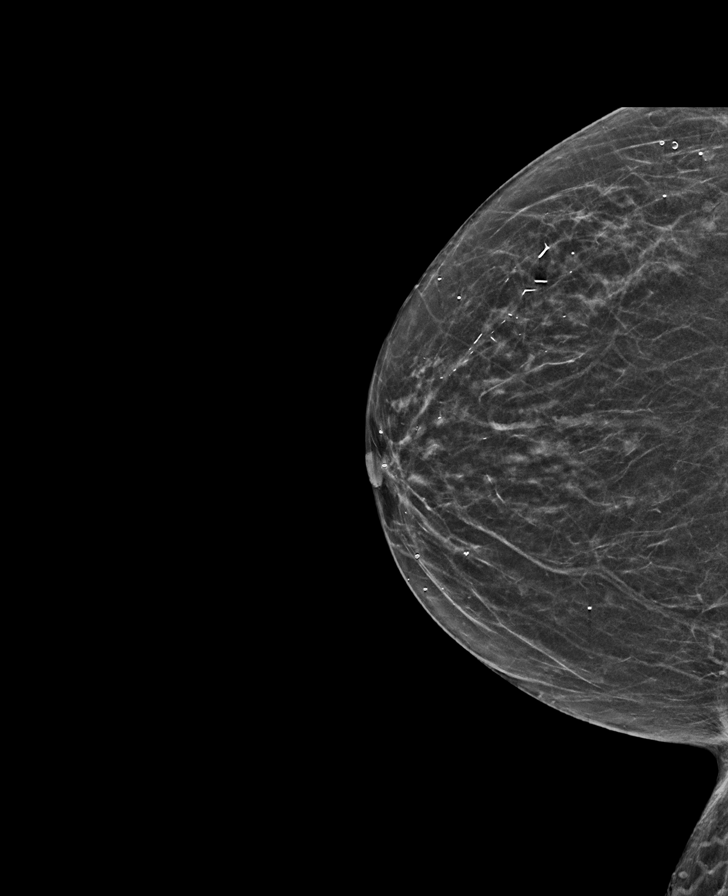

[R MLO synth-2D]
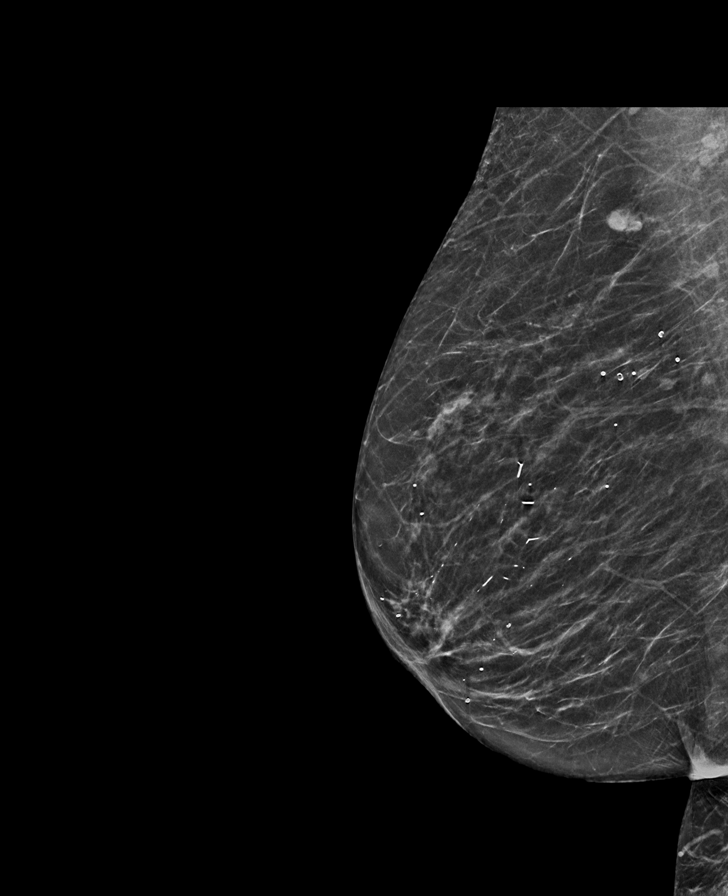

[L MLO synth-2D]
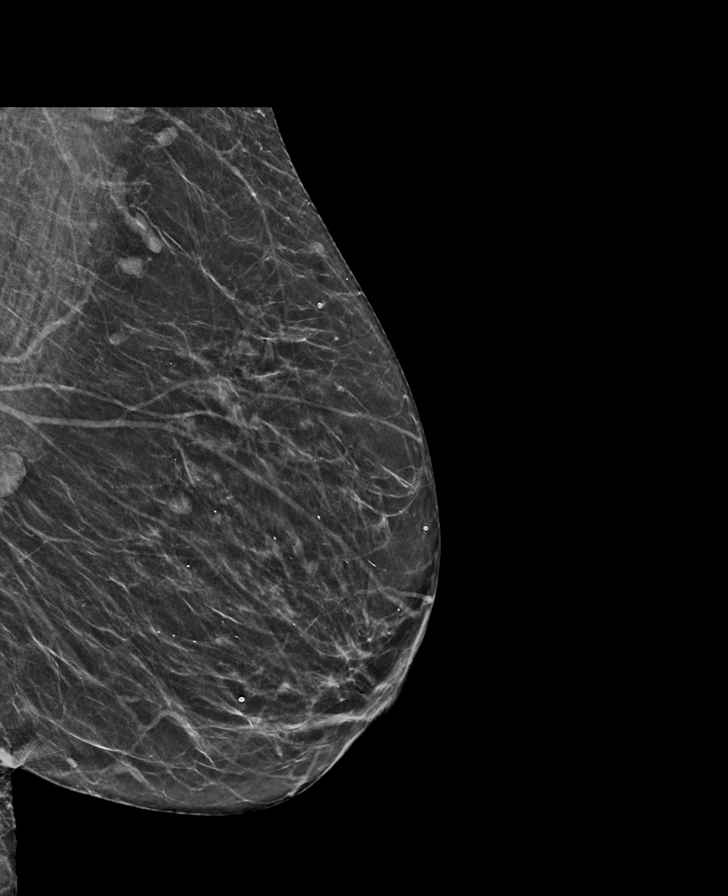

[R MLO tomo · tomo slice 29/56.0]
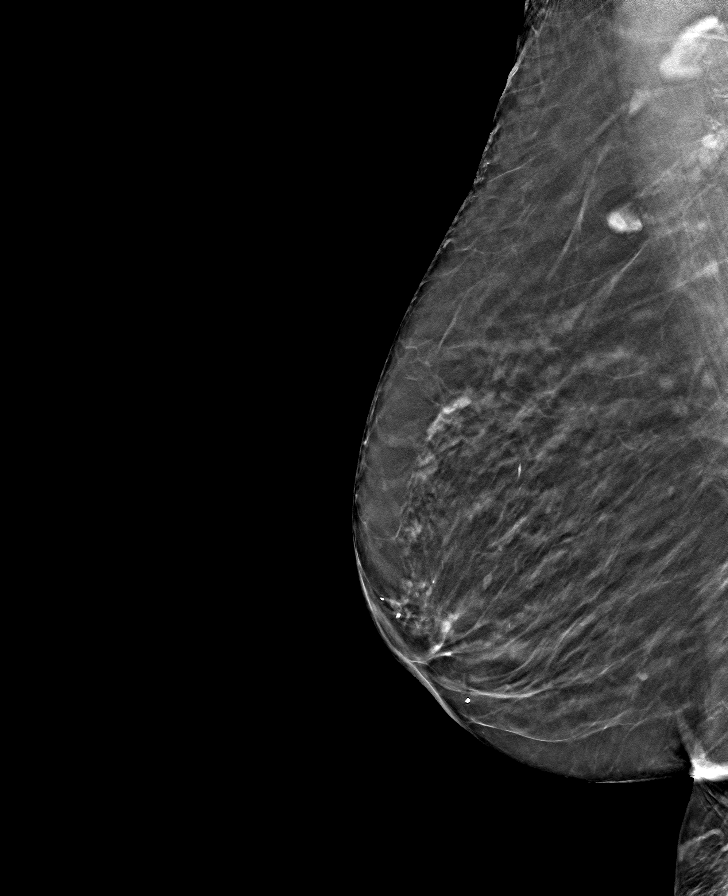

[L CC tomo · tomo slice 27/54.0]
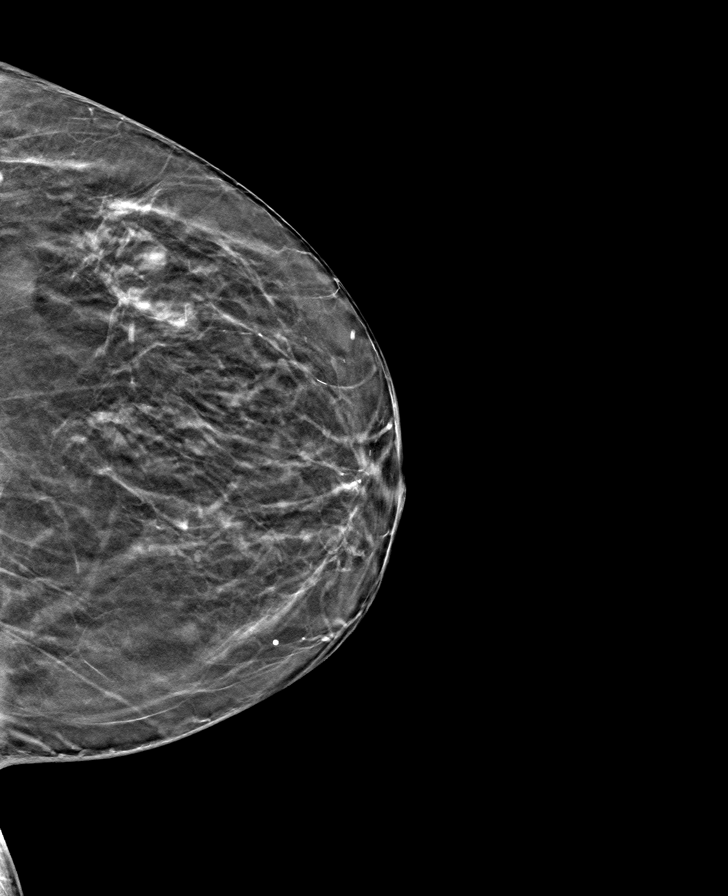

[R CC tomo · tomo slice 27/53.0]
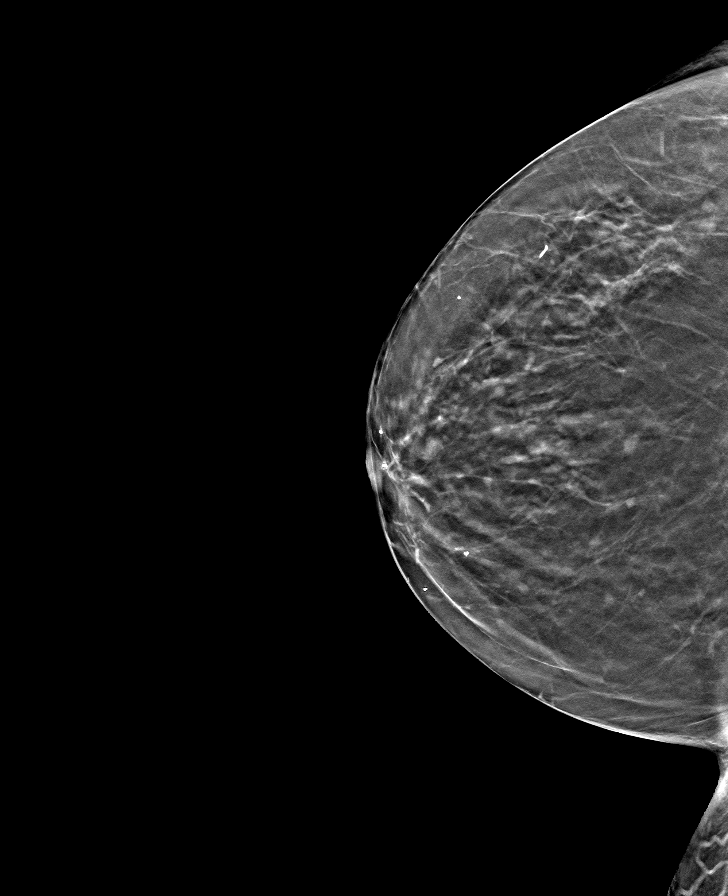

[L MLO tomo · tomo slice 27/54.0]
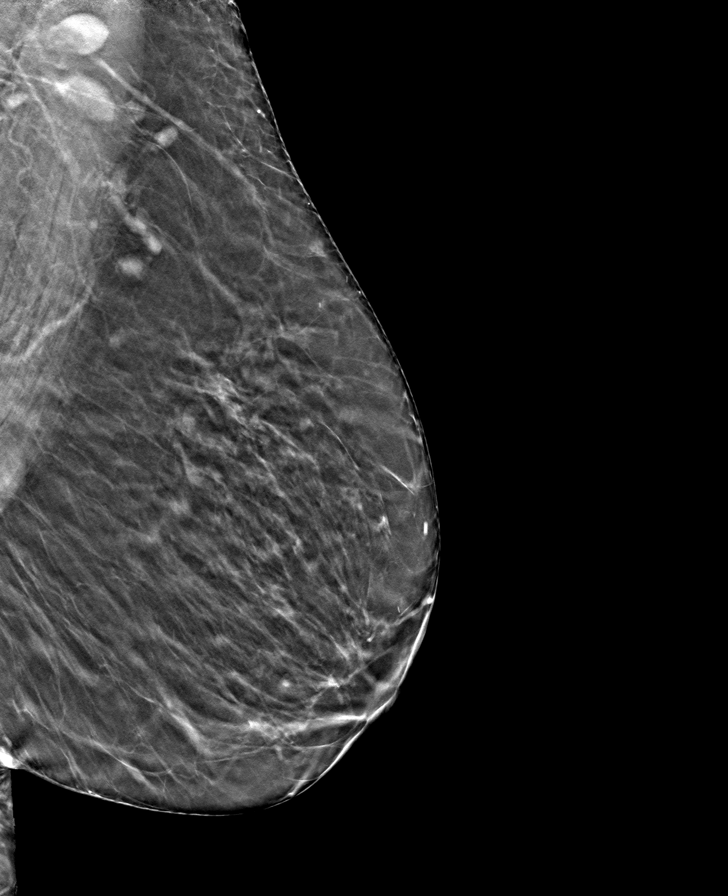

[8 of 24 positions shown; findings below may reference images not displayed]

ACR Breast Density Category b: There are scattered areas of
fibroglandular density.
FINDINGS: There are no findings suspicious for malignancy. Images were
processed with CAD.
IMPRESSION: No mammographic evidence of malignancy. A result letter of this
screening mammogram will be mailed directly to the patient.

RECOMMENDATION:
Screening mammogram in one year. (Code:[TQ])

BI-RADS CATEGORY  1: Negative.

## 2020-08-17 ENCOUNTER — Other Ambulatory Visit: Payer: Self-pay | Admitting: Physician Assistant

## 2020-09-19 ENCOUNTER — Other Ambulatory Visit: Payer: Self-pay | Admitting: Physician Assistant

## 2020-09-22 ENCOUNTER — Ambulatory Visit
Admission: RE | Admit: 2020-09-22 | Discharge: 2020-09-22 | Disposition: A | Discharge status: Home / Self Care | Payer: Medicare Other | Source: Ambulatory Visit | Attending: Internal Medicine | Admitting: Internal Medicine

## 2020-09-22 ENCOUNTER — Other Ambulatory Visit: Payer: Self-pay

## 2020-09-22 DIAGNOSIS — M8580 Other specified disorders of bone density and structure, unspecified site: Secondary | ICD-10-CM

## 2020-10-09 ENCOUNTER — Other Ambulatory Visit: Payer: Self-pay

## 2020-10-09 ENCOUNTER — Ambulatory Visit: Payer: Medicare Other | Attending: Internal Medicine

## 2020-10-09 DIAGNOSIS — Z23 Encounter for immunization: Secondary | ICD-10-CM

## 2020-10-09 NOTE — Progress Notes (Signed)
   Covid-19 Vaccination Clinic  Name:  MILEE QUALLS    MRN: [id number] DOB: February 08, 1941  10/09/2020  Ms. Pellegrino was observed post Covid-19 immunization for 15 minutes without incident. She was provided with Vaccine Information Sheet and instruction to access the V-Safe system.   Ms. Macha was instructed to call 911 with any severe reactions post vaccine: [name] Difficulty breathing  . Swelling of face and throat  . A fast heartbeat  . A bad rash all over body  . Dizziness and weakness

## 2021-01-20 DIAGNOSIS — R809 Proteinuria, unspecified: Secondary | ICD-10-CM | POA: Diagnosis not present

## 2021-01-20 DIAGNOSIS — I129 Hypertensive chronic kidney disease with stage 1 through stage 4 chronic kidney disease, or unspecified chronic kidney disease: Secondary | ICD-10-CM | POA: Diagnosis not present

## 2021-01-20 DIAGNOSIS — E1122 Type 2 diabetes mellitus with diabetic chronic kidney disease: Secondary | ICD-10-CM | POA: Diagnosis not present

## 2021-01-20 DIAGNOSIS — N184 Chronic kidney disease, stage 4 (severe): Secondary | ICD-10-CM | POA: Diagnosis not present

## 2021-01-20 DIAGNOSIS — Z720 Tobacco use: Secondary | ICD-10-CM | POA: Diagnosis not present

## 2021-01-21 DIAGNOSIS — I10 Essential (primary) hypertension: Secondary | ICD-10-CM | POA: Diagnosis not present

## 2021-01-21 DIAGNOSIS — E782 Mixed hyperlipidemia: Secondary | ICD-10-CM | POA: Diagnosis not present

## 2021-01-21 DIAGNOSIS — G4700 Insomnia, unspecified: Secondary | ICD-10-CM | POA: Diagnosis not present

## 2021-01-21 DIAGNOSIS — K219 Gastro-esophageal reflux disease without esophagitis: Secondary | ICD-10-CM | POA: Diagnosis not present

## 2021-01-21 DIAGNOSIS — N184 Chronic kidney disease, stage 4 (severe): Secondary | ICD-10-CM | POA: Diagnosis not present

## 2021-01-21 DIAGNOSIS — M8580 Other specified disorders of bone density and structure, unspecified site: Secondary | ICD-10-CM | POA: Diagnosis not present

## 2021-01-21 DIAGNOSIS — E1122 Type 2 diabetes mellitus with diabetic chronic kidney disease: Secondary | ICD-10-CM | POA: Diagnosis not present

## 2021-01-21 DIAGNOSIS — E1142 Type 2 diabetes mellitus with diabetic polyneuropathy: Secondary | ICD-10-CM | POA: Diagnosis not present

## 2021-01-31 DIAGNOSIS — D631 Anemia in chronic kidney disease: Secondary | ICD-10-CM | POA: Diagnosis not present

## 2021-01-31 DIAGNOSIS — N189 Chronic kidney disease, unspecified: Secondary | ICD-10-CM | POA: Diagnosis not present

## 2021-02-07 DIAGNOSIS — N189 Chronic kidney disease, unspecified: Secondary | ICD-10-CM | POA: Diagnosis not present

## 2021-02-07 DIAGNOSIS — D631 Anemia in chronic kidney disease: Secondary | ICD-10-CM | POA: Diagnosis not present

## 2021-02-21 DIAGNOSIS — I129 Hypertensive chronic kidney disease with stage 1 through stage 4 chronic kidney disease, or unspecified chronic kidney disease: Secondary | ICD-10-CM | POA: Diagnosis not present

## 2021-02-21 DIAGNOSIS — I10 Essential (primary) hypertension: Secondary | ICD-10-CM | POA: Diagnosis not present

## 2021-02-21 DIAGNOSIS — N184 Chronic kidney disease, stage 4 (severe): Secondary | ICD-10-CM | POA: Diagnosis not present

## 2021-02-21 DIAGNOSIS — E1142 Type 2 diabetes mellitus with diabetic polyneuropathy: Secondary | ICD-10-CM | POA: Diagnosis not present

## 2021-02-21 DIAGNOSIS — E782 Mixed hyperlipidemia: Secondary | ICD-10-CM | POA: Diagnosis not present

## 2021-02-21 DIAGNOSIS — G4700 Insomnia, unspecified: Secondary | ICD-10-CM | POA: Diagnosis not present

## 2021-02-21 DIAGNOSIS — M8580 Other specified disorders of bone density and structure, unspecified site: Secondary | ICD-10-CM | POA: Diagnosis not present

## 2021-02-21 DIAGNOSIS — K219 Gastro-esophageal reflux disease without esophagitis: Secondary | ICD-10-CM | POA: Diagnosis not present

## 2021-02-21 DIAGNOSIS — E1122 Type 2 diabetes mellitus with diabetic chronic kidney disease: Secondary | ICD-10-CM | POA: Diagnosis not present

## 2021-04-11 DIAGNOSIS — N184 Chronic kidney disease, stage 4 (severe): Secondary | ICD-10-CM | POA: Diagnosis not present

## 2021-04-11 DIAGNOSIS — E1122 Type 2 diabetes mellitus with diabetic chronic kidney disease: Secondary | ICD-10-CM | POA: Diagnosis not present

## 2021-04-11 DIAGNOSIS — I10 Essential (primary) hypertension: Secondary | ICD-10-CM | POA: Diagnosis not present

## 2021-04-11 DIAGNOSIS — E782 Mixed hyperlipidemia: Secondary | ICD-10-CM | POA: Diagnosis not present

## 2021-04-11 DIAGNOSIS — G4700 Insomnia, unspecified: Secondary | ICD-10-CM | POA: Diagnosis not present

## 2021-04-11 DIAGNOSIS — I129 Hypertensive chronic kidney disease with stage 1 through stage 4 chronic kidney disease, or unspecified chronic kidney disease: Secondary | ICD-10-CM | POA: Diagnosis not present

## 2021-04-11 DIAGNOSIS — E1142 Type 2 diabetes mellitus with diabetic polyneuropathy: Secondary | ICD-10-CM | POA: Diagnosis not present

## 2021-04-11 DIAGNOSIS — K219 Gastro-esophageal reflux disease without esophagitis: Secondary | ICD-10-CM | POA: Diagnosis not present

## 2021-04-12 DIAGNOSIS — Z720 Tobacco use: Secondary | ICD-10-CM | POA: Diagnosis not present

## 2021-04-12 DIAGNOSIS — E1122 Type 2 diabetes mellitus with diabetic chronic kidney disease: Secondary | ICD-10-CM | POA: Diagnosis not present

## 2021-04-12 DIAGNOSIS — R809 Proteinuria, unspecified: Secondary | ICD-10-CM | POA: Diagnosis not present

## 2021-04-12 DIAGNOSIS — I129 Hypertensive chronic kidney disease with stage 1 through stage 4 chronic kidney disease, or unspecified chronic kidney disease: Secondary | ICD-10-CM | POA: Diagnosis not present

## 2021-04-12 DIAGNOSIS — N184 Chronic kidney disease, stage 4 (severe): Secondary | ICD-10-CM | POA: Diagnosis not present

## 2021-05-09 DIAGNOSIS — E782 Mixed hyperlipidemia: Secondary | ICD-10-CM | POA: Diagnosis not present

## 2021-05-09 DIAGNOSIS — N184 Chronic kidney disease, stage 4 (severe): Secondary | ICD-10-CM | POA: Diagnosis not present

## 2021-05-09 DIAGNOSIS — M8580 Other specified disorders of bone density and structure, unspecified site: Secondary | ICD-10-CM | POA: Diagnosis not present

## 2021-05-09 DIAGNOSIS — G4700 Insomnia, unspecified: Secondary | ICD-10-CM | POA: Diagnosis not present

## 2021-05-09 DIAGNOSIS — I10 Essential (primary) hypertension: Secondary | ICD-10-CM | POA: Diagnosis not present

## 2021-05-09 DIAGNOSIS — E1122 Type 2 diabetes mellitus with diabetic chronic kidney disease: Secondary | ICD-10-CM | POA: Diagnosis not present

## 2021-05-09 DIAGNOSIS — K219 Gastro-esophageal reflux disease without esophagitis: Secondary | ICD-10-CM | POA: Diagnosis not present

## 2021-05-09 DIAGNOSIS — I129 Hypertensive chronic kidney disease with stage 1 through stage 4 chronic kidney disease, or unspecified chronic kidney disease: Secondary | ICD-10-CM | POA: Diagnosis not present

## 2021-05-09 DIAGNOSIS — E1142 Type 2 diabetes mellitus with diabetic polyneuropathy: Secondary | ICD-10-CM | POA: Diagnosis not present

## 2021-06-09 ENCOUNTER — Other Ambulatory Visit: Payer: Self-pay | Admitting: Internal Medicine

## 2021-06-09 DIAGNOSIS — Z1231 Encounter for screening mammogram for malignant neoplasm of breast: Secondary | ICD-10-CM

## 2021-06-14 DIAGNOSIS — Z720 Tobacco use: Secondary | ICD-10-CM | POA: Diagnosis not present

## 2021-06-14 DIAGNOSIS — N184 Chronic kidney disease, stage 4 (severe): Secondary | ICD-10-CM | POA: Diagnosis not present

## 2021-06-14 DIAGNOSIS — R809 Proteinuria, unspecified: Secondary | ICD-10-CM | POA: Diagnosis not present

## 2021-06-14 DIAGNOSIS — E1122 Type 2 diabetes mellitus with diabetic chronic kidney disease: Secondary | ICD-10-CM | POA: Diagnosis not present

## 2021-06-14 DIAGNOSIS — I129 Hypertensive chronic kidney disease with stage 1 through stage 4 chronic kidney disease, or unspecified chronic kidney disease: Secondary | ICD-10-CM | POA: Diagnosis not present

## 2021-06-28 DIAGNOSIS — G2581 Restless legs syndrome: Secondary | ICD-10-CM | POA: Diagnosis not present

## 2021-06-28 DIAGNOSIS — Z0000 Encounter for general adult medical examination without abnormal findings: Secondary | ICD-10-CM | POA: Diagnosis not present

## 2021-06-28 DIAGNOSIS — G4700 Insomnia, unspecified: Secondary | ICD-10-CM | POA: Diagnosis not present

## 2021-06-28 DIAGNOSIS — I129 Hypertensive chronic kidney disease with stage 1 through stage 4 chronic kidney disease, or unspecified chronic kidney disease: Secondary | ICD-10-CM | POA: Diagnosis not present

## 2021-06-28 DIAGNOSIS — E0590 Thyrotoxicosis, unspecified without thyrotoxic crisis or storm: Secondary | ICD-10-CM | POA: Diagnosis not present

## 2021-06-28 DIAGNOSIS — K219 Gastro-esophageal reflux disease without esophagitis: Secondary | ICD-10-CM | POA: Diagnosis not present

## 2021-06-28 DIAGNOSIS — E1122 Type 2 diabetes mellitus with diabetic chronic kidney disease: Secondary | ICD-10-CM | POA: Diagnosis not present

## 2021-06-28 DIAGNOSIS — M8580 Other specified disorders of bone density and structure, unspecified site: Secondary | ICD-10-CM | POA: Diagnosis not present

## 2021-06-28 DIAGNOSIS — E1142 Type 2 diabetes mellitus with diabetic polyneuropathy: Secondary | ICD-10-CM | POA: Diagnosis not present

## 2021-06-28 DIAGNOSIS — F17200 Nicotine dependence, unspecified, uncomplicated: Secondary | ICD-10-CM | POA: Diagnosis not present

## 2021-06-28 DIAGNOSIS — E782 Mixed hyperlipidemia: Secondary | ICD-10-CM | POA: Diagnosis not present

## 2021-06-28 DIAGNOSIS — Z1389 Encounter for screening for other disorder: Secondary | ICD-10-CM | POA: Diagnosis not present

## 2021-06-28 DIAGNOSIS — N184 Chronic kidney disease, stage 4 (severe): Secondary | ICD-10-CM | POA: Diagnosis not present

## 2021-07-05 DIAGNOSIS — N186 End stage renal disease: Secondary | ICD-10-CM | POA: Diagnosis not present

## 2021-07-05 DIAGNOSIS — E785 Hyperlipidemia, unspecified: Secondary | ICD-10-CM | POA: Diagnosis present

## 2021-07-05 DIAGNOSIS — I120 Hypertensive chronic kidney disease with stage 5 chronic kidney disease or end stage renal disease: Secondary | ICD-10-CM | POA: Diagnosis not present

## 2021-07-05 DIAGNOSIS — E0590 Thyrotoxicosis, unspecified without thyrotoxic crisis or storm: Secondary | ICD-10-CM | POA: Diagnosis present

## 2021-07-05 DIAGNOSIS — E1122 Type 2 diabetes mellitus with diabetic chronic kidney disease: Secondary | ICD-10-CM | POA: Diagnosis not present

## 2021-07-05 DIAGNOSIS — Z4902 Encounter for fitting and adjustment of peritoneal dialysis catheter: Secondary | ICD-10-CM | POA: Diagnosis not present

## 2021-07-05 DIAGNOSIS — N2581 Secondary hyperparathyroidism of renal origin: Secondary | ICD-10-CM | POA: Diagnosis not present

## 2021-07-15 ENCOUNTER — Encounter: Payer: Self-pay | Admitting: Cardiovascular Disease

## 2021-07-15 ENCOUNTER — Other Ambulatory Visit: Payer: Self-pay

## 2021-07-15 ENCOUNTER — Ambulatory Visit: Payer: Medicare Other | Admitting: Cardiovascular Disease

## 2021-07-15 VITALS — BP 140/72 | HR 61 | Ht 62.5 in | Wt 126.6 lb

## 2021-07-15 DIAGNOSIS — Z01810 Encounter for preprocedural cardiovascular examination: Secondary | ICD-10-CM

## 2021-07-15 DIAGNOSIS — E1122 Type 2 diabetes mellitus with diabetic chronic kidney disease: Secondary | ICD-10-CM

## 2021-07-15 DIAGNOSIS — I10 Essential (primary) hypertension: Secondary | ICD-10-CM | POA: Diagnosis not present

## 2021-07-15 DIAGNOSIS — R55 Syncope and collapse: Secondary | ICD-10-CM

## 2021-07-15 DIAGNOSIS — N184 Chronic kidney disease, stage 4 (severe): Secondary | ICD-10-CM

## 2021-07-15 DIAGNOSIS — E785 Hyperlipidemia, unspecified: Secondary | ICD-10-CM | POA: Diagnosis not present

## 2021-07-15 NOTE — Progress Notes (Signed)
Cardiology Office Note:    Date:  07/16/2021   ID:  Monica Hunt, DOB 10-04-1941, MRN [id number]  PCP:  Lavone Orn, MD  Cardiologist:  Sanda Klein, MD    Referring MD: Lavone Orn, MD   Chief Complaint  Patient presents with   Loss of Consciousness    History of Present Illness:    Monica Hunt is a 80 y.o. female with a hx of Diabetes mellitus, chronic kidney disease (Dr. Moshe Cipro), hypertension, hyperthyroidism.   She has had several syncopal events in the past, but none since we reduced the dose of her her beta-blocker.  Her previous rhythm monitor showed persistent bradycardia.  Recently, she has not had any problems with edema and she has not required furosemide for couple of months.  She denies dizziness or exertional dyspnea.  Takes care of her own household chores.  Her blood pressure has been well controlled, usually in the 130s/60s range.  She is making preparations to undergo placement of a Tenckhoff catheter for peritoneal dialysis (Dr. Dema Severin, [facility] in [facility]).  Her most recent creatinine was 3.85.  The patient specifically denies any chest pain at rest exertion, dyspnea at rest or with exertion, orthopnea, paroxysmal nocturnal dyspnea, syncope, palpitations, focal neurological deficits, intermittent claudication, lower extremity edema, unexplained weight gain, cough, hemoptysis or wheezing.   Additional comorbidities include diabetes mellitus with polyneuropathy and chronic kidney disease stage IV as well as hypertension, restless leg syndrome, acid reflux, irritable bowel syndrome, hyperthyroidism on chronic methimazole.  She had a normal ECG treadmill stress test in January 2020, normal left ventricular systolic function and minimal abnormalities on the echocardiogram in June 2018.  Past Medical History:  Diagnosis Date   Cataracts, bilateral    Depression    Diabetes mellitus without complication ([place])    Hypertension     Past Surgical History:   Procedure Laterality Date   ABDOMINAL HYSTERECTOMY     vaginal   CARPAL TUNNEL RELEASE Right    COLONOSCOPY WITH PROPOFOL N/A 11/17/2014   Procedure: COLONOSCOPY WITH PROPOFOL;  Surgeon: Garlan Fair, MD;  Location: [facility];  Service: Endoscopy;  Laterality: N/A;    Current Medications: Current Meds  Medication Sig   ACCU-CHEK SMARTVIEW test strip    atorvastatin (LIPITOR) 40 MG tablet Take 40 mg by mouth daily.   cholecalciferol (VITAMIN D) 1000 UNITS tablet Take 1,000 Units by mouth daily.   clonazePAM (KLONOPIN) 0.5 MG tablet Take 0.5 mg by mouth 2 (two) times daily as needed for anxiety.   furosemide (LASIX) 40 MG tablet Take 40 mg by mouth as needed.   IRON PO Take by mouth.   methimazole (TAPAZOLE) 5 MG tablet Take 5 mg by mouth daily. Take 1 and 1/2 tab alternating once a day   metoprolol succinate (TOPROL-XL) 25 MG 24 hr tablet Take 1 tablet (25 mg total) by mouth daily. Take with or immediately following a meal.   Multiple Vitamin (MULTIVITAMIN WITH MINERALS) TABS tablet Take 1 tablet by mouth daily.   omeprazole (PRILOSEC) 40 MG capsule Take 40 mg by mouth daily.   rOPINIRole (REQUIP) 3 MG tablet Take 3 mg by mouth at bedtime. Take 1 tab 1 to 3 hours before bedtime   traZODone (DESYREL) 50 MG tablet 50 mg. Take 1-2 tabs daily at bedtime   [DISCONTINUED] atorvastatin (LIPITOR) 40 MG tablet Take 40 mg by mouth daily.   [DISCONTINUED] furosemide (LASIX) 40 MG tablet TAKE 1 TABLET BY MOUTH EVERY DAY (Patient taking differently:  as needed.)     Allergies:   Other   Social History   Socioeconomic History   Marital status: Divorced    Spouse name: Not on file   Number of children: Not on file   Years of education: Not on file   Highest education level: Not on file  Occupational History   Not on file  Tobacco Use   Smoking status: Every Day    Packs/day: 0.50    Years: 45.00    Pack years: 22.50    Types: Cigarettes   Smokeless tobacco: Never  Substance and  Sexual Activity   Alcohol use: No   Drug use: No   Sexual activity: Not on file  Other Topics Concern   Not on file  Social History Narrative   Not on file   Social Determinants of Health   Financial Resource Strain: Not on file  Food Insecurity: Not on file  Transportation Needs: Not on file  Physical Activity: Not on file  Stress: Not on file  Social Connections: Not on file     Family History: The patient's Family history significant for lung cancer in her father who died when he was only 35 years old, renal cancer in her mother who died at age 21. She has a brother who died at age 80 from kidney failure. Her daughter also has hyperthyroidism. ROS:   Please see the history of present illness.    All other systems are reviewed and are negative.  EKGs/Labs/Other Studies Reviewed:   Echocardiogram 05/31/2017 - Left ventricle: The cavity size was normal. Systolic function was    normal. The estimated ejection fraction was in the range of 55%    to 60%. Wall motion was normal; there were no regional wall    motion abnormalities. There was an increased relative    contribution of atrial contraction to ventricular filling.    Doppler parameters are consistent with abnormal left ventricular    relaxation (grade 1 diastolic dysfunction).  - Aortic valve: Trileaflet; normal thickness, mildly calcified    leaflets.  - Atrial septum: There was increased thickness of the septum,    consistent with lipomatous hypertrophy.  - Pulmonary arteries: PA peak pressure: 31 mm Hg (S).   ECG treadmill stress test 01/01/2019 Blood pressure demonstrated a normal response to exercise. There was no ST segment deviation noted during stress.   Normal ETT Rare PVCls with stress and in recovery Patient only achieved 87% of PMHR    EKG:  EKG is not ordered today.  Personally reviewed, shows normal sinus rhythm and is a completely normal tracing.  I made her a copy to take to her preop  visit.  Recent Labs: BMET No results found for: NA, K, CL, CO2, GLUCOSE, BUN, CREATININE, CALCIUM, GFRNONAA, GFRAA 02/04/2020  hemoglobin 11.1, creatinine 2.7 12/11/2019 TSH 0.89, A1c 5.7% 04/12/2021 Hemoglobin 10.1, creatinine 3.85  Lipid Panel  No results found for: CHOL, TRIG, HDL, CHOLHDL, VLDL, LDLCALC, LDLDIRECT, LABVLDL 05/17/2018  total cholesterol 151, HDL 30, LDL 73, triglycerides 241 04/12/2021 Cholesterol 137, HDL 29, LDL 48, triglycerides 401 (not a fasting sample)  Physical Exam:    VS:  BP 140/72 (BP Location: Left Arm, Patient Position: Sitting, Cuff Size: Normal)   Pulse 61   Ht 5' 2.5" (1.588 m)   Wt 126 lb 9.6 oz (57.4 kg)   SpO2 98%   BMI 22.79 kg/m     Wt Readings from Last 3 Encounters:  07/15/21 126 lb 9.6 oz (  57.4 kg)  06/02/20 122 lb 12.8 oz (55.7 kg)  02/20/20 129 lb (58.5 kg)     General: Alert, oriented x3, no distress, lean Head: no evidence of trauma, PERRL, EOMI, no exophtalmos or lid lag, no myxedema, no xanthelasma; normal ears, nose and oropharynx Neck: normal jugular venous pulsations and no hepatojugular reflux; brisk carotid pulses without delay and no carotid bruits Chest: clear to auscultation, no signs of consolidation by percussion or palpation, normal fremitus, symmetrical and full respiratory excursions Cardiovascular: normal position and quality of the apical impulse, regular rhythm, normal first and second heart sounds, 1-2/6 early peaking aortic ejection murmur, no diastolic murmurs, rubs or gallops Abdomen: no tenderness or distention, no masses by palpation, no abnormal pulsatility or arterial bruits, normal bowel sounds, no hepatosplenomegaly Extremities: no clubbing, cyanosis or edema; 2+ radial, ulnar and brachial pulses bilaterally; 2+ right femoral, posterior tibial and dorsalis pedis pulses; 2+ left femoral, posterior tibial and dorsalis pedis pulses; no subclavian or femoral bruits Neurological: grossly nonfocal Psych:  Normal mood and affect    ASSESSMENT:    1. Vasovagal syncope   2. Controlled type 2 diabetes mellitus with stage 4 chronic kidney disease, without long-term current use of insulin ([place])   3. Chronic kidney disease (CKD), stage IV (severe) ([place])   4. Essential hypertension   5. Dyslipidemia (high LDL; low HDL)   6. Preoperative cardiovascular examination     PLAN:    In order of problems listed above:  1. Syncope: This has not recurred since we reduced her dose of beta-blocker.  She continues to be relatively bradycardic with a heart rate around 60 today. 2. DM: Well-controlled with recent hemoglobin A1c 5.8%. 3. CKD 4: Making plans for home peritoneal dialysis.  Scheduled for placement of a Tenckhoff catheter on August 5 with Dr. Dema Severin in [facility]. 4.  HTN: Well-controlled.  Currently only on low-dose metoprolol. 5.  Hyperthyroidism: Euthyroid on methimazole therapy. 6.  Hypercholesterolemia: Her triglycerides were elevated but the sample was not fasting.  As always has a remarkably low HDL cholesterol, but LDL cholesterol is well within target range.  Continue atorvastatin. 7. Preop CV exam: Good functional status.  Relatively recent normal stress test.  Normal resting ECG and no symptoms of active cardiac illness.  At low risk for major cardiovascular complications with the planned upcoming surgery.  Important not to discontinue her beta-blocker abruptly in the perioperative period.    Medication Adjustments/Labs and Tests Ordered: Current medicines are reviewed at length with the patient today.  Concerns regarding medicines are outlined above. Labs and tests ordered and medication changes are outlined in the patient instructions below:  Patient Instructions  Medication Instructions:  No changes *If you need a refill on your cardiac medications before your next appointment, please call your pharmacy*   Lab Work: None ordered If you have labs (blood work) drawn today and  your tests are completely normal, you will receive your results only by: [place] (if you have MyChart) OR A paper copy in the mail If you have any lab test that is abnormal or we need to change your treatment, we will call you to review the results.   Testing/Procedures: None ordered   Follow-Up: At [facility], you and your health needs are our priority.  As part of our continuing mission to provide you with exceptional heart care, we have created designated Provider Care Teams.  These Care Teams include your primary Cardiologist (physician) and Advanced Practice Providers (APPs -  Physician Assistants and Nurse Practitioners) who all work together to provide you with the care you need, when you need it.  We recommend signing up for the patient portal called "MyChart".  Sign up information is provided on this After Visit Summary.  MyChart is used to connect with patients for Virtual Visits (Telemedicine).  Patients are able to view lab/test results, encounter notes, upcoming appointments, etc.  Non-urgent messages can be sent to your provider as well.   To learn more about what you can do with MyChart, go to [email or web address].    Your next appointment:   12 month(s)  The format for your next appointment:   In Person  Provider:   You may see Sanda Klein, MD or one of the following Advanced Practice Providers on your designated Care Team:   [name], PA-C Fabian Sharp, [place] or  Roby Lofts, PA-C    Signed, Sanda Klein, MD  07/16/2021 12:04 PM    [place] .

## 2021-07-15 NOTE — Patient Instructions (Signed)

## 2021-07-16 ENCOUNTER — Encounter: Payer: Self-pay | Admitting: Cardiovascular Disease

## 2021-07-29 DIAGNOSIS — I120 Hypertensive chronic kidney disease with stage 5 chronic kidney disease or end stage renal disease: Secondary | ICD-10-CM | POA: Diagnosis not present

## 2021-07-29 DIAGNOSIS — N186 End stage renal disease: Secondary | ICD-10-CM | POA: Diagnosis not present

## 2021-07-29 DIAGNOSIS — E1122 Type 2 diabetes mellitus with diabetic chronic kidney disease: Secondary | ICD-10-CM | POA: Diagnosis not present

## 2021-07-29 DIAGNOSIS — Z992 Dependence on renal dialysis: Secondary | ICD-10-CM | POA: Diagnosis not present

## 2021-08-05 DIAGNOSIS — L240 Irritant contact dermatitis due to detergents: Secondary | ICD-10-CM | POA: Diagnosis not present

## 2021-08-09 DIAGNOSIS — N184 Chronic kidney disease, stage 4 (severe): Secondary | ICD-10-CM | POA: Diagnosis not present

## 2021-08-10 ENCOUNTER — Ambulatory Visit
Admission: RE | Admit: 2021-08-10 | Discharge: 2021-08-10 | Disposition: A | Discharge status: Home / Self Care | Payer: Medicare Other | Source: Ambulatory Visit | Attending: Internal Medicine | Admitting: Internal Medicine

## 2021-08-10 ENCOUNTER — Other Ambulatory Visit: Payer: Self-pay

## 2021-08-10 DIAGNOSIS — Z1231 Encounter for screening mammogram for malignant neoplasm of breast: Secondary | ICD-10-CM

## 2021-08-11 DIAGNOSIS — D631 Anemia in chronic kidney disease: Secondary | ICD-10-CM | POA: Diagnosis present

## 2021-08-11 DIAGNOSIS — N189 Chronic kidney disease, unspecified: Secondary | ICD-10-CM | POA: Diagnosis present

## 2021-08-15 DIAGNOSIS — R82998 Other abnormal findings in urine: Secondary | ICD-10-CM | POA: Diagnosis not present

## 2021-08-15 DIAGNOSIS — Z4932 Encounter for adequacy testing for peritoneal dialysis: Secondary | ICD-10-CM | POA: Diagnosis not present

## 2021-08-15 DIAGNOSIS — E1122 Type 2 diabetes mellitus with diabetic chronic kidney disease: Secondary | ICD-10-CM | POA: Diagnosis not present

## 2021-08-15 DIAGNOSIS — N186 End stage renal disease: Secondary | ICD-10-CM | POA: Diagnosis not present

## 2021-08-15 DIAGNOSIS — Z992 Dependence on renal dialysis: Secondary | ICD-10-CM | POA: Diagnosis not present

## 2021-08-15 DIAGNOSIS — D509 Iron deficiency anemia, unspecified: Secondary | ICD-10-CM | POA: Diagnosis not present

## 2021-08-16 DIAGNOSIS — D509 Iron deficiency anemia, unspecified: Secondary | ICD-10-CM | POA: Diagnosis not present

## 2021-08-16 DIAGNOSIS — R809 Proteinuria, unspecified: Secondary | ICD-10-CM | POA: Diagnosis not present

## 2021-08-16 DIAGNOSIS — Z4932 Encounter for adequacy testing for peritoneal dialysis: Secondary | ICD-10-CM | POA: Diagnosis not present

## 2021-08-16 DIAGNOSIS — Z992 Dependence on renal dialysis: Secondary | ICD-10-CM | POA: Diagnosis not present

## 2021-08-16 DIAGNOSIS — I129 Hypertensive chronic kidney disease with stage 1 through stage 4 chronic kidney disease, or unspecified chronic kidney disease: Secondary | ICD-10-CM | POA: Diagnosis not present

## 2021-08-16 DIAGNOSIS — E1122 Type 2 diabetes mellitus with diabetic chronic kidney disease: Secondary | ICD-10-CM | POA: Diagnosis not present

## 2021-08-16 DIAGNOSIS — R82998 Other abnormal findings in urine: Secondary | ICD-10-CM | POA: Diagnosis not present

## 2021-08-16 DIAGNOSIS — Z720 Tobacco use: Secondary | ICD-10-CM | POA: Diagnosis not present

## 2021-08-16 DIAGNOSIS — N186 End stage renal disease: Secondary | ICD-10-CM | POA: Diagnosis not present

## 2021-08-16 DIAGNOSIS — N184 Chronic kidney disease, stage 4 (severe): Secondary | ICD-10-CM | POA: Diagnosis not present

## 2021-08-25 ENCOUNTER — Telehealth: Payer: Self-pay | Admitting: Cardiovascular Disease

## 2021-08-25 NOTE — Telephone Encounter (Signed)
Attempted to contact patient to discuss further.  Unable to reach patient, attempted to leave a message but was unable.  Will try again later.

## 2021-08-25 NOTE — Telephone Encounter (Signed)
Pt c/o BP issue: STAT if pt c/o blurred vision, one-sided weakness or slurred speech  1. What are your last 5 BP readings? Top number was over 200 and bottom number was in the high 80s  2. Are you having any other symptoms (ex. Dizziness, headache, blurred vision, passed out)? Dizziness   3. What is your BP issue? Exteremly high

## 2021-08-30 DIAGNOSIS — D509 Iron deficiency anemia, unspecified: Secondary | ICD-10-CM | POA: Diagnosis not present

## 2021-08-30 DIAGNOSIS — N186 End stage renal disease: Secondary | ICD-10-CM | POA: Diagnosis not present

## 2021-08-30 DIAGNOSIS — R82998 Other abnormal findings in urine: Secondary | ICD-10-CM | POA: Diagnosis not present

## 2021-08-30 DIAGNOSIS — D631 Anemia in chronic kidney disease: Secondary | ICD-10-CM | POA: Diagnosis not present

## 2021-08-30 DIAGNOSIS — K769 Liver disease, unspecified: Secondary | ICD-10-CM | POA: Diagnosis not present

## 2021-08-30 DIAGNOSIS — Z992 Dependence on renal dialysis: Secondary | ICD-10-CM | POA: Diagnosis not present

## 2021-08-30 DIAGNOSIS — Z23 Encounter for immunization: Secondary | ICD-10-CM | POA: Diagnosis not present

## 2021-08-31 NOTE — Telephone Encounter (Signed)
Attempted to contact patient to see how she was doing- unable to reach patient.  LVM to call back if assistance was still needed.  Left call back number.

## 2021-09-01 DIAGNOSIS — Z992 Dependence on renal dialysis: Secondary | ICD-10-CM | POA: Diagnosis not present

## 2021-09-01 DIAGNOSIS — Z23 Encounter for immunization: Secondary | ICD-10-CM | POA: Diagnosis not present

## 2021-09-01 DIAGNOSIS — R82998 Other abnormal findings in urine: Secondary | ICD-10-CM | POA: Diagnosis not present

## 2021-09-01 DIAGNOSIS — D631 Anemia in chronic kidney disease: Secondary | ICD-10-CM | POA: Diagnosis not present

## 2021-09-01 DIAGNOSIS — D509 Iron deficiency anemia, unspecified: Secondary | ICD-10-CM | POA: Diagnosis not present

## 2021-09-01 DIAGNOSIS — K769 Liver disease, unspecified: Secondary | ICD-10-CM | POA: Diagnosis not present

## 2021-09-01 DIAGNOSIS — N186 End stage renal disease: Secondary | ICD-10-CM | POA: Diagnosis not present

## 2021-09-08 DIAGNOSIS — R109 Unspecified abdominal pain: Secondary | ICD-10-CM | POA: Diagnosis not present

## 2021-09-08 DIAGNOSIS — I700 Atherosclerosis of aorta: Secondary | ICD-10-CM | POA: Diagnosis not present

## 2021-09-08 DIAGNOSIS — K5790 Diverticulosis of intestine, part unspecified, without perforation or abscess without bleeding: Secondary | ICD-10-CM | POA: Diagnosis not present

## 2021-09-09 DIAGNOSIS — R109 Unspecified abdominal pain: Secondary | ICD-10-CM | POA: Diagnosis not present

## 2021-09-09 DIAGNOSIS — K5730 Diverticulosis of large intestine without perforation or abscess without bleeding: Secondary | ICD-10-CM | POA: Diagnosis not present

## 2021-09-09 DIAGNOSIS — I700 Atherosclerosis of aorta: Secondary | ICD-10-CM | POA: Diagnosis not present

## 2021-09-09 DIAGNOSIS — R188 Other ascites: Secondary | ICD-10-CM | POA: Diagnosis not present

## 2021-09-14 ENCOUNTER — Other Ambulatory Visit: Payer: Self-pay

## 2021-09-14 ENCOUNTER — Encounter: Payer: Self-pay | Admitting: Vascular Surgery

## 2021-09-14 ENCOUNTER — Ambulatory Visit: Payer: Medicare Other | Admitting: Vascular Surgery

## 2021-09-14 VITALS — BP 148/80 | HR 67 | Temp 98.5°F | Resp 20 | Ht 62.5 in | Wt 126.0 lb

## 2021-09-14 DIAGNOSIS — N185 Chronic kidney disease, stage 5: Secondary | ICD-10-CM | POA: Diagnosis not present

## 2021-09-14 NOTE — Progress Notes (Signed)
Patient ID: Monica Hunt, female   DOB: October 26, 1941, 80 y.o.   MRN: [id number]  Reason for Consult: New Patient (Initial Visit) (Problems with PD cath)   Referred by Lavone Orn, MD  Subjective:     HPI:  Monica Hunt is a 80 y.o. female with chronic kidney disease followed in [place] by nephrology.  She has diabetes and hypertension as risk factors for vascular disease.  She previously had a PD catheter placed in August at [facility].  She has had pain with draining the catheter on multiple occasions now.  She does not have any pain at rest.  She states that the catheter is working.  She has not needed it for dialysis yet.  They are planning to attempt again Monday to drain.  She is here for second opinion regarding her catheter.  Past Medical History:  Diagnosis Date   Cataracts, bilateral    Depression    Diabetes mellitus without [place] ([place])    Hypertension    History reviewed. No pertinent family history. Past Surgical History:  Procedure Laterality Date   ABDOMINAL HYSTERECTOMY     vaginal   CARPAL TUNNEL RELEASE Right    COLONOSCOPY WITH PROPOFOL N/A 11/17/2014   Procedure: COLONOSCOPY WITH PROPOFOL;  Surgeon: Garlan Fair, MD;  Location: [facility];  Service: Endoscopy;  Laterality: N/A;    Short Social History:  Social History   Tobacco Use   Smoking status: Every Day    Packs/day: 0.50    Years: 45.00    Pack years: 22.50    Types: Cigarettes   Smokeless tobacco: Never  Substance Use Topics   Alcohol use: No    Allergies  Allergen Reactions   Bee Venom Anaphylaxis   Other Anaphylaxis    Wasp sting    Current Outpatient Medications  Medication Sig Dispense Refill   ACCU-CHEK SMARTVIEW test strip      atorvastatin (LIPITOR) 40 MG tablet Take 40 mg by mouth daily.     cholecalciferol (VITAMIN D) 1000 UNITS tablet Take 1,000 Units by mouth daily.     clonazePAM (KLONOPIN) 0.5 MG tablet Take 0.5 mg by mouth 2 (two) times daily as  needed for anxiety.     EPINEPHrine 0.3 mg/0.3 mL IJ SOAJ injection Inject 0.3 mg into the muscle once.     furosemide (LASIX) 40 MG tablet Take 40 mg by mouth as needed.     IRON PO Take by mouth.     methimazole (TAPAZOLE) 5 MG tablet Take 5 mg by mouth daily. Take 1 and 1/2 tab alternating once a day     metoprolol succinate (TOPROL-XL) 25 MG 24 hr tablet Take 1 tablet (25 mg total) by mouth daily. Take with or immediately following a meal. 90 tablet 2   Multiple Vitamin (MULTIVITAMIN WITH MINERALS) TABS tablet Take 1 tablet by mouth daily.     omeprazole (PRILOSEC) 40 MG capsule Take 40 mg by mouth daily.     rOPINIRole (REQUIP) 3 MG tablet Take 3 mg by mouth at bedtime. Take 1 tab 1 to 3 hours before bedtime     traZODone (DESYREL) 50 MG tablet 50 mg. Take 1-2 tabs daily at bedtime     No current facility-administered medications for this visit.    Review of Systems  Constitutional:  Constitutional negative. HENT: HENT negative.  Eyes: Eyes negative.  Respiratory: Respiratory negative.  Cardiovascular: Cardiovascular negative.  GI: Positive for abdominal pain.  Musculoskeletal: Musculoskeletal negative.  Skin:  Skin negative.  Neurological: Neurological negative. Hematologic: Hematologic/lymphatic negative.  Psychiatric: Psychiatric negative.       Objective:  Objective  Vitals:   09/14/21 1043  BP: (!) 148/80  Pulse: 67  Resp: 20  Temp: 98.5 F (36.9 C)  SpO2: 98%     Physical Exam HENT:     Head: Normocephalic.     Nose:     Comments: Wearing a mask Eyes:     Pupils: Pupils are equal, round, and reactive to light.  Cardiovascular:     Rate and Rhythm: Normal rate.  Pulmonary:     Effort: Pulmonary effort is normal.  Abdominal:     General: Abdomen is flat.     Palpations: Abdomen is soft.  Musculoskeletal:        General: Normal range of motion.     Cervical back: Normal range of motion and neck supple.  Skin:    General: Skin is warm.     Capillary  Refill: Capillary refill takes less than 2 seconds.  Neurological:     General: No focal deficit present.     Mental Status: She is alert.  Psychiatric:        Mood and Affect: Mood normal.        Behavior: Behavior normal.        Thought Content: Thought content normal.    Data: No studies     Assessment/Plan:     80 year old female having pain with PD catheter when draining.  I discussed with the patient that given that it has not been used it may be unlikely to get it working comfortably.  I have given her 4 options.  The first would be continue to attempt to use which she states they are going to try on Monday and she is going to attempt this option.  Her other options would be to go back to the surgeon in [facility] to discuss repositioning with possible diagnostic laparoscopy versus removal.  We could also plan for diagnostic laparoscopy here versus removal here.    She is going to attempt to use it again on Monday and if she has continued pain she can call to schedule diagnostic laparoscopy which will be performed by Dr. Stanford Breed at [facility].  She may also decide to have the catheter removed which could be performed by me or Dr. Stanford Breed.     Waynetta Sandy MD [facility]

## 2021-09-19 DIAGNOSIS — K769 Liver disease, unspecified: Secondary | ICD-10-CM | POA: Diagnosis not present

## 2021-09-19 DIAGNOSIS — R82998 Other abnormal findings in urine: Secondary | ICD-10-CM | POA: Diagnosis not present

## 2021-09-19 DIAGNOSIS — Z23 Encounter for immunization: Secondary | ICD-10-CM | POA: Diagnosis not present

## 2021-09-19 DIAGNOSIS — D509 Iron deficiency anemia, unspecified: Secondary | ICD-10-CM | POA: Diagnosis not present

## 2021-09-19 DIAGNOSIS — N186 End stage renal disease: Secondary | ICD-10-CM | POA: Diagnosis not present

## 2021-09-19 DIAGNOSIS — Z992 Dependence on renal dialysis: Secondary | ICD-10-CM | POA: Diagnosis not present

## 2021-09-19 DIAGNOSIS — D631 Anemia in chronic kidney disease: Secondary | ICD-10-CM | POA: Diagnosis not present

## 2021-09-20 DIAGNOSIS — N186 End stage renal disease: Secondary | ICD-10-CM | POA: Diagnosis not present

## 2021-09-20 DIAGNOSIS — Z992 Dependence on renal dialysis: Secondary | ICD-10-CM | POA: Diagnosis not present

## 2021-09-20 DIAGNOSIS — Z23 Encounter for immunization: Secondary | ICD-10-CM | POA: Diagnosis not present

## 2021-09-21 DIAGNOSIS — N186 End stage renal disease: Secondary | ICD-10-CM | POA: Diagnosis not present

## 2021-09-21 DIAGNOSIS — Z992 Dependence on renal dialysis: Secondary | ICD-10-CM | POA: Diagnosis not present

## 2021-09-21 DIAGNOSIS — Z23 Encounter for immunization: Secondary | ICD-10-CM | POA: Diagnosis not present

## 2021-09-22 DIAGNOSIS — N186 End stage renal disease: Secondary | ICD-10-CM | POA: Diagnosis not present

## 2021-09-22 DIAGNOSIS — Z992 Dependence on renal dialysis: Secondary | ICD-10-CM | POA: Diagnosis not present

## 2021-09-22 DIAGNOSIS — Z23 Encounter for immunization: Secondary | ICD-10-CM | POA: Diagnosis not present

## 2021-09-23 DIAGNOSIS — N186 End stage renal disease: Secondary | ICD-10-CM | POA: Diagnosis not present

## 2021-09-23 DIAGNOSIS — Z992 Dependence on renal dialysis: Secondary | ICD-10-CM | POA: Diagnosis not present

## 2021-09-23 DIAGNOSIS — Z23 Encounter for immunization: Secondary | ICD-10-CM | POA: Diagnosis not present

## 2021-09-26 ENCOUNTER — Ambulatory Visit: Payer: Medicare Other

## 2021-09-26 DIAGNOSIS — D509 Iron deficiency anemia, unspecified: Secondary | ICD-10-CM | POA: Diagnosis not present

## 2021-09-26 DIAGNOSIS — N186 End stage renal disease: Secondary | ICD-10-CM | POA: Diagnosis not present

## 2021-09-26 DIAGNOSIS — Z23 Encounter for immunization: Secondary | ICD-10-CM | POA: Diagnosis not present

## 2021-09-26 DIAGNOSIS — Z992 Dependence on renal dialysis: Secondary | ICD-10-CM | POA: Diagnosis not present

## 2021-09-26 DIAGNOSIS — R82998 Other abnormal findings in urine: Secondary | ICD-10-CM | POA: Diagnosis not present

## 2021-09-26 DIAGNOSIS — Z4932 Encounter for adequacy testing for peritoneal dialysis: Secondary | ICD-10-CM | POA: Diagnosis not present

## 2021-09-26 DIAGNOSIS — E1122 Type 2 diabetes mellitus with diabetic chronic kidney disease: Secondary | ICD-10-CM | POA: Diagnosis not present

## 2021-09-27 DIAGNOSIS — D509 Iron deficiency anemia, unspecified: Secondary | ICD-10-CM | POA: Diagnosis not present

## 2021-09-27 DIAGNOSIS — Z23 Encounter for immunization: Secondary | ICD-10-CM | POA: Diagnosis not present

## 2021-09-27 DIAGNOSIS — Z992 Dependence on renal dialysis: Secondary | ICD-10-CM | POA: Diagnosis not present

## 2021-09-27 DIAGNOSIS — R82998 Other abnormal findings in urine: Secondary | ICD-10-CM | POA: Diagnosis not present

## 2021-09-27 DIAGNOSIS — N186 End stage renal disease: Secondary | ICD-10-CM | POA: Diagnosis not present

## 2021-09-27 DIAGNOSIS — E1122 Type 2 diabetes mellitus with diabetic chronic kidney disease: Secondary | ICD-10-CM | POA: Diagnosis not present

## 2021-09-27 DIAGNOSIS — Z4932 Encounter for adequacy testing for peritoneal dialysis: Secondary | ICD-10-CM | POA: Diagnosis not present

## 2021-09-28 DIAGNOSIS — D509 Iron deficiency anemia, unspecified: Secondary | ICD-10-CM | POA: Diagnosis not present

## 2021-09-28 DIAGNOSIS — Z23 Encounter for immunization: Secondary | ICD-10-CM | POA: Diagnosis not present

## 2021-09-28 DIAGNOSIS — E1122 Type 2 diabetes mellitus with diabetic chronic kidney disease: Secondary | ICD-10-CM | POA: Diagnosis not present

## 2021-09-28 DIAGNOSIS — N186 End stage renal disease: Secondary | ICD-10-CM | POA: Diagnosis not present

## 2021-09-28 DIAGNOSIS — Z4932 Encounter for adequacy testing for peritoneal dialysis: Secondary | ICD-10-CM | POA: Diagnosis not present

## 2021-09-28 DIAGNOSIS — Z992 Dependence on renal dialysis: Secondary | ICD-10-CM | POA: Diagnosis not present

## 2021-09-28 DIAGNOSIS — R82998 Other abnormal findings in urine: Secondary | ICD-10-CM | POA: Diagnosis not present

## 2021-09-30 DIAGNOSIS — E1122 Type 2 diabetes mellitus with diabetic chronic kidney disease: Secondary | ICD-10-CM | POA: Diagnosis not present

## 2021-09-30 DIAGNOSIS — Z4932 Encounter for adequacy testing for peritoneal dialysis: Secondary | ICD-10-CM | POA: Diagnosis not present

## 2021-09-30 DIAGNOSIS — Z992 Dependence on renal dialysis: Secondary | ICD-10-CM | POA: Diagnosis not present

## 2021-09-30 DIAGNOSIS — Z23 Encounter for immunization: Secondary | ICD-10-CM | POA: Diagnosis not present

## 2021-09-30 DIAGNOSIS — N186 End stage renal disease: Secondary | ICD-10-CM | POA: Diagnosis not present

## 2021-09-30 DIAGNOSIS — R82998 Other abnormal findings in urine: Secondary | ICD-10-CM | POA: Diagnosis not present

## 2021-09-30 DIAGNOSIS — D509 Iron deficiency anemia, unspecified: Secondary | ICD-10-CM | POA: Diagnosis not present

## 2021-10-03 DIAGNOSIS — Z992 Dependence on renal dialysis: Secondary | ICD-10-CM | POA: Diagnosis not present

## 2021-10-03 DIAGNOSIS — N186 End stage renal disease: Secondary | ICD-10-CM | POA: Diagnosis not present

## 2021-10-03 DIAGNOSIS — Z23 Encounter for immunization: Secondary | ICD-10-CM | POA: Diagnosis not present

## 2021-10-03 DIAGNOSIS — E1122 Type 2 diabetes mellitus with diabetic chronic kidney disease: Secondary | ICD-10-CM | POA: Diagnosis not present

## 2021-10-03 DIAGNOSIS — D509 Iron deficiency anemia, unspecified: Secondary | ICD-10-CM | POA: Diagnosis not present

## 2021-10-03 DIAGNOSIS — R82998 Other abnormal findings in urine: Secondary | ICD-10-CM | POA: Diagnosis not present

## 2021-10-03 DIAGNOSIS — Z4932 Encounter for adequacy testing for peritoneal dialysis: Secondary | ICD-10-CM | POA: Diagnosis not present

## 2021-10-04 DIAGNOSIS — Z992 Dependence on renal dialysis: Secondary | ICD-10-CM | POA: Diagnosis not present

## 2021-10-04 DIAGNOSIS — E1122 Type 2 diabetes mellitus with diabetic chronic kidney disease: Secondary | ICD-10-CM | POA: Diagnosis not present

## 2021-10-04 DIAGNOSIS — R82998 Other abnormal findings in urine: Secondary | ICD-10-CM | POA: Diagnosis not present

## 2021-10-04 DIAGNOSIS — Z4932 Encounter for adequacy testing for peritoneal dialysis: Secondary | ICD-10-CM | POA: Diagnosis not present

## 2021-10-04 DIAGNOSIS — N186 End stage renal disease: Secondary | ICD-10-CM | POA: Diagnosis not present

## 2021-10-04 DIAGNOSIS — Z23 Encounter for immunization: Secondary | ICD-10-CM | POA: Diagnosis not present

## 2021-10-04 DIAGNOSIS — D509 Iron deficiency anemia, unspecified: Secondary | ICD-10-CM | POA: Diagnosis not present

## 2021-10-06 DIAGNOSIS — Z992 Dependence on renal dialysis: Secondary | ICD-10-CM | POA: Diagnosis not present

## 2021-10-06 DIAGNOSIS — N186 End stage renal disease: Secondary | ICD-10-CM | POA: Diagnosis not present

## 2021-10-07 DIAGNOSIS — N186 End stage renal disease: Secondary | ICD-10-CM | POA: Diagnosis not present

## 2021-10-07 DIAGNOSIS — Z992 Dependence on renal dialysis: Secondary | ICD-10-CM | POA: Diagnosis not present

## 2021-10-08 DIAGNOSIS — Z992 Dependence on renal dialysis: Secondary | ICD-10-CM | POA: Diagnosis not present

## 2021-10-08 DIAGNOSIS — N186 End stage renal disease: Secondary | ICD-10-CM | POA: Diagnosis not present

## 2021-10-09 DIAGNOSIS — Z992 Dependence on renal dialysis: Secondary | ICD-10-CM | POA: Diagnosis not present

## 2021-10-09 DIAGNOSIS — N186 End stage renal disease: Secondary | ICD-10-CM | POA: Diagnosis not present

## 2021-10-10 DIAGNOSIS — N186 End stage renal disease: Secondary | ICD-10-CM | POA: Diagnosis not present

## 2021-10-10 DIAGNOSIS — Z992 Dependence on renal dialysis: Secondary | ICD-10-CM | POA: Diagnosis not present

## 2021-10-11 DIAGNOSIS — Z992 Dependence on renal dialysis: Secondary | ICD-10-CM | POA: Diagnosis not present

## 2021-10-11 DIAGNOSIS — N186 End stage renal disease: Secondary | ICD-10-CM | POA: Diagnosis not present

## 2021-10-12 DIAGNOSIS — Z961 Presence of intraocular lens: Secondary | ICD-10-CM | POA: Diagnosis not present

## 2021-10-12 DIAGNOSIS — Z992 Dependence on renal dialysis: Secondary | ICD-10-CM | POA: Diagnosis not present

## 2021-10-12 DIAGNOSIS — H527 Unspecified disorder of refraction: Secondary | ICD-10-CM | POA: Diagnosis not present

## 2021-10-12 DIAGNOSIS — N186 End stage renal disease: Secondary | ICD-10-CM | POA: Diagnosis not present

## 2021-10-12 DIAGNOSIS — H35033 Hypertensive retinopathy, bilateral: Secondary | ICD-10-CM | POA: Diagnosis not present

## 2021-10-12 DIAGNOSIS — E119 Type 2 diabetes mellitus without complications: Secondary | ICD-10-CM | POA: Diagnosis not present

## 2021-10-13 ENCOUNTER — Ambulatory Visit: Payer: Medicare Other | Attending: Internal Medicine

## 2021-10-13 ENCOUNTER — Other Ambulatory Visit (HOSPITAL_BASED_OUTPATIENT_CLINIC_OR_DEPARTMENT_OTHER): Payer: Self-pay

## 2021-10-13 DIAGNOSIS — Z23 Encounter for immunization: Secondary | ICD-10-CM

## 2021-10-13 DIAGNOSIS — N186 End stage renal disease: Secondary | ICD-10-CM | POA: Diagnosis not present

## 2021-10-13 DIAGNOSIS — Z992 Dependence on renal dialysis: Secondary | ICD-10-CM | POA: Diagnosis not present

## 2021-10-13 MED ORDER — PFIZER COVID-19 VAC BIVALENT 30 MCG/0.3ML IM SUSP
INTRAMUSCULAR | 0 refills | Status: DC
Start: 2021-10-13 — End: 2021-12-13
  Filled 2021-10-13: qty 0.3, 1d supply, fill #0

## 2021-10-13 NOTE — Progress Notes (Signed)
   Covid-19 Vaccination Clinic  Name:  Monica Hunt    MRN: [id number] DOB: [birth year]  10/13/2021  [name] was observed post Covid-19 immunization for 15 minutes without incident. She was provided with Vaccine Information Sheet and instruction to access the V-Safe system.   [name] was instructed to call 911 with any severe reactions post vaccine: Difficulty breathing  Swelling of face and throat  A fast heartbeat  A bad rash all over body  Dizziness and weakness   Immunizations Administered     Name Date Dose VIS Date Route   Pfizer Covid-19 Vaccine Bivalent Booster 10/13/2021  1:09 PM 0.3 mL 08/24/2021 Intramuscular   Manufacturer: [place]   Lot: [id number]   [place]: [phone]

## 2021-10-14 DIAGNOSIS — Z992 Dependence on renal dialysis: Secondary | ICD-10-CM | POA: Diagnosis not present

## 2021-10-14 DIAGNOSIS — N186 End stage renal disease: Secondary | ICD-10-CM | POA: Diagnosis not present

## 2021-10-15 DIAGNOSIS — Z992 Dependence on renal dialysis: Secondary | ICD-10-CM | POA: Diagnosis not present

## 2021-10-15 DIAGNOSIS — N186 End stage renal disease: Secondary | ICD-10-CM | POA: Diagnosis not present

## 2021-10-16 DIAGNOSIS — N186 End stage renal disease: Secondary | ICD-10-CM | POA: Diagnosis not present

## 2021-10-16 DIAGNOSIS — Z992 Dependence on renal dialysis: Secondary | ICD-10-CM | POA: Diagnosis not present

## 2021-10-17 ENCOUNTER — Inpatient Hospital Stay: Admission: RE | Admit: 2021-10-17 | Payer: Medicare Other | Source: Ambulatory Visit

## 2021-10-17 DIAGNOSIS — N186 End stage renal disease: Secondary | ICD-10-CM | POA: Diagnosis not present

## 2021-10-17 DIAGNOSIS — Z992 Dependence on renal dialysis: Secondary | ICD-10-CM | POA: Diagnosis not present

## 2021-10-18 DIAGNOSIS — Z992 Dependence on renal dialysis: Secondary | ICD-10-CM | POA: Diagnosis not present

## 2021-10-18 DIAGNOSIS — N186 End stage renal disease: Secondary | ICD-10-CM | POA: Diagnosis not present

## 2021-10-19 DIAGNOSIS — Z992 Dependence on renal dialysis: Secondary | ICD-10-CM | POA: Diagnosis not present

## 2021-10-19 DIAGNOSIS — N186 End stage renal disease: Secondary | ICD-10-CM | POA: Diagnosis not present

## 2021-10-20 DIAGNOSIS — N186 End stage renal disease: Secondary | ICD-10-CM | POA: Diagnosis not present

## 2021-10-20 DIAGNOSIS — Z992 Dependence on renal dialysis: Secondary | ICD-10-CM | POA: Diagnosis not present

## 2021-10-21 DIAGNOSIS — N186 End stage renal disease: Secondary | ICD-10-CM | POA: Diagnosis not present

## 2021-10-21 DIAGNOSIS — Z992 Dependence on renal dialysis: Secondary | ICD-10-CM | POA: Diagnosis not present

## 2021-10-22 DIAGNOSIS — N186 End stage renal disease: Secondary | ICD-10-CM | POA: Diagnosis not present

## 2021-10-22 DIAGNOSIS — Z992 Dependence on renal dialysis: Secondary | ICD-10-CM | POA: Diagnosis not present

## 2021-10-23 DIAGNOSIS — N186 End stage renal disease: Secondary | ICD-10-CM | POA: Diagnosis not present

## 2021-10-23 DIAGNOSIS — Z992 Dependence on renal dialysis: Secondary | ICD-10-CM | POA: Diagnosis not present

## 2021-10-24 DIAGNOSIS — Z992 Dependence on renal dialysis: Secondary | ICD-10-CM | POA: Diagnosis not present

## 2021-10-24 DIAGNOSIS — N186 End stage renal disease: Secondary | ICD-10-CM | POA: Diagnosis not present

## 2021-10-24 DIAGNOSIS — I129 Hypertensive chronic kidney disease with stage 1 through stage 4 chronic kidney disease, or unspecified chronic kidney disease: Secondary | ICD-10-CM | POA: Diagnosis not present

## 2021-10-25 DIAGNOSIS — Z23 Encounter for immunization: Secondary | ICD-10-CM | POA: Diagnosis not present

## 2021-10-25 DIAGNOSIS — R82998 Other abnormal findings in urine: Secondary | ICD-10-CM | POA: Diagnosis not present

## 2021-10-25 DIAGNOSIS — N186 End stage renal disease: Secondary | ICD-10-CM | POA: Diagnosis not present

## 2021-10-25 DIAGNOSIS — K769 Liver disease, unspecified: Secondary | ICD-10-CM | POA: Diagnosis not present

## 2021-10-25 DIAGNOSIS — Z4932 Encounter for adequacy testing for peritoneal dialysis: Secondary | ICD-10-CM | POA: Diagnosis not present

## 2021-10-25 DIAGNOSIS — D509 Iron deficiency anemia, unspecified: Secondary | ICD-10-CM | POA: Diagnosis not present

## 2021-10-25 DIAGNOSIS — Z992 Dependence on renal dialysis: Secondary | ICD-10-CM | POA: Diagnosis not present

## 2021-10-26 DIAGNOSIS — R82998 Other abnormal findings in urine: Secondary | ICD-10-CM | POA: Diagnosis not present

## 2021-10-26 DIAGNOSIS — D509 Iron deficiency anemia, unspecified: Secondary | ICD-10-CM | POA: Diagnosis not present

## 2021-10-26 DIAGNOSIS — N186 End stage renal disease: Secondary | ICD-10-CM | POA: Diagnosis not present

## 2021-10-26 DIAGNOSIS — Z23 Encounter for immunization: Secondary | ICD-10-CM | POA: Diagnosis not present

## 2021-10-26 DIAGNOSIS — Z992 Dependence on renal dialysis: Secondary | ICD-10-CM | POA: Diagnosis not present

## 2021-10-26 DIAGNOSIS — K769 Liver disease, unspecified: Secondary | ICD-10-CM | POA: Diagnosis not present

## 2021-10-26 DIAGNOSIS — Z4932 Encounter for adequacy testing for peritoneal dialysis: Secondary | ICD-10-CM | POA: Diagnosis not present

## 2021-10-27 DIAGNOSIS — Z23 Encounter for immunization: Secondary | ICD-10-CM | POA: Diagnosis not present

## 2021-10-27 DIAGNOSIS — Z992 Dependence on renal dialysis: Secondary | ICD-10-CM | POA: Diagnosis not present

## 2021-10-27 DIAGNOSIS — K769 Liver disease, unspecified: Secondary | ICD-10-CM | POA: Diagnosis not present

## 2021-10-27 DIAGNOSIS — D509 Iron deficiency anemia, unspecified: Secondary | ICD-10-CM | POA: Diagnosis not present

## 2021-10-27 DIAGNOSIS — Z4932 Encounter for adequacy testing for peritoneal dialysis: Secondary | ICD-10-CM | POA: Diagnosis not present

## 2021-10-27 DIAGNOSIS — R82998 Other abnormal findings in urine: Secondary | ICD-10-CM | POA: Diagnosis not present

## 2021-10-27 DIAGNOSIS — N186 End stage renal disease: Secondary | ICD-10-CM | POA: Diagnosis not present

## 2021-10-28 DIAGNOSIS — Z992 Dependence on renal dialysis: Secondary | ICD-10-CM | POA: Diagnosis not present

## 2021-10-28 DIAGNOSIS — Z4932 Encounter for adequacy testing for peritoneal dialysis: Secondary | ICD-10-CM | POA: Diagnosis not present

## 2021-10-28 DIAGNOSIS — R82998 Other abnormal findings in urine: Secondary | ICD-10-CM | POA: Diagnosis not present

## 2021-10-28 DIAGNOSIS — N186 End stage renal disease: Secondary | ICD-10-CM | POA: Diagnosis not present

## 2021-10-28 DIAGNOSIS — K769 Liver disease, unspecified: Secondary | ICD-10-CM | POA: Diagnosis not present

## 2021-10-28 DIAGNOSIS — D509 Iron deficiency anemia, unspecified: Secondary | ICD-10-CM | POA: Diagnosis not present

## 2021-10-28 DIAGNOSIS — Z23 Encounter for immunization: Secondary | ICD-10-CM | POA: Diagnosis not present

## 2021-10-29 DIAGNOSIS — Z23 Encounter for immunization: Secondary | ICD-10-CM | POA: Diagnosis not present

## 2021-10-29 DIAGNOSIS — Z4932 Encounter for adequacy testing for peritoneal dialysis: Secondary | ICD-10-CM | POA: Diagnosis not present

## 2021-10-29 DIAGNOSIS — D509 Iron deficiency anemia, unspecified: Secondary | ICD-10-CM | POA: Diagnosis not present

## 2021-10-29 DIAGNOSIS — K769 Liver disease, unspecified: Secondary | ICD-10-CM | POA: Diagnosis not present

## 2021-10-29 DIAGNOSIS — N186 End stage renal disease: Secondary | ICD-10-CM | POA: Diagnosis not present

## 2021-10-29 DIAGNOSIS — R82998 Other abnormal findings in urine: Secondary | ICD-10-CM | POA: Diagnosis not present

## 2021-10-29 DIAGNOSIS — Z992 Dependence on renal dialysis: Secondary | ICD-10-CM | POA: Diagnosis not present

## 2021-10-30 DIAGNOSIS — N186 End stage renal disease: Secondary | ICD-10-CM | POA: Diagnosis not present

## 2021-10-30 DIAGNOSIS — R82998 Other abnormal findings in urine: Secondary | ICD-10-CM | POA: Diagnosis not present

## 2021-10-30 DIAGNOSIS — Z992 Dependence on renal dialysis: Secondary | ICD-10-CM | POA: Diagnosis not present

## 2021-10-30 DIAGNOSIS — Z23 Encounter for immunization: Secondary | ICD-10-CM | POA: Diagnosis not present

## 2021-10-30 DIAGNOSIS — K769 Liver disease, unspecified: Secondary | ICD-10-CM | POA: Diagnosis not present

## 2021-10-30 DIAGNOSIS — D509 Iron deficiency anemia, unspecified: Secondary | ICD-10-CM | POA: Diagnosis not present

## 2021-10-30 DIAGNOSIS — Z4932 Encounter for adequacy testing for peritoneal dialysis: Secondary | ICD-10-CM | POA: Diagnosis not present

## 2021-10-31 DIAGNOSIS — R82998 Other abnormal findings in urine: Secondary | ICD-10-CM | POA: Diagnosis not present

## 2021-10-31 DIAGNOSIS — Z992 Dependence on renal dialysis: Secondary | ICD-10-CM | POA: Diagnosis not present

## 2021-10-31 DIAGNOSIS — K769 Liver disease, unspecified: Secondary | ICD-10-CM | POA: Diagnosis not present

## 2021-10-31 DIAGNOSIS — Z4932 Encounter for adequacy testing for peritoneal dialysis: Secondary | ICD-10-CM | POA: Diagnosis not present

## 2021-10-31 DIAGNOSIS — D509 Iron deficiency anemia, unspecified: Secondary | ICD-10-CM | POA: Diagnosis not present

## 2021-10-31 DIAGNOSIS — N186 End stage renal disease: Secondary | ICD-10-CM | POA: Diagnosis not present

## 2021-10-31 DIAGNOSIS — Z23 Encounter for immunization: Secondary | ICD-10-CM | POA: Diagnosis not present

## 2021-11-01 DIAGNOSIS — D509 Iron deficiency anemia, unspecified: Secondary | ICD-10-CM | POA: Diagnosis not present

## 2021-11-01 DIAGNOSIS — N186 End stage renal disease: Secondary | ICD-10-CM | POA: Diagnosis not present

## 2021-11-01 DIAGNOSIS — Z4932 Encounter for adequacy testing for peritoneal dialysis: Secondary | ICD-10-CM | POA: Diagnosis not present

## 2021-11-01 DIAGNOSIS — Z23 Encounter for immunization: Secondary | ICD-10-CM | POA: Diagnosis not present

## 2021-11-01 DIAGNOSIS — K769 Liver disease, unspecified: Secondary | ICD-10-CM | POA: Diagnosis not present

## 2021-11-01 DIAGNOSIS — Z992 Dependence on renal dialysis: Secondary | ICD-10-CM | POA: Diagnosis not present

## 2021-11-01 DIAGNOSIS — R82998 Other abnormal findings in urine: Secondary | ICD-10-CM | POA: Diagnosis not present

## 2021-11-02 DIAGNOSIS — R82998 Other abnormal findings in urine: Secondary | ICD-10-CM | POA: Diagnosis not present

## 2021-11-02 DIAGNOSIS — Z4932 Encounter for adequacy testing for peritoneal dialysis: Secondary | ICD-10-CM | POA: Diagnosis not present

## 2021-11-02 DIAGNOSIS — Z992 Dependence on renal dialysis: Secondary | ICD-10-CM | POA: Diagnosis not present

## 2021-11-02 DIAGNOSIS — Z23 Encounter for immunization: Secondary | ICD-10-CM | POA: Diagnosis not present

## 2021-11-02 DIAGNOSIS — D509 Iron deficiency anemia, unspecified: Secondary | ICD-10-CM | POA: Diagnosis not present

## 2021-11-02 DIAGNOSIS — K769 Liver disease, unspecified: Secondary | ICD-10-CM | POA: Diagnosis not present

## 2021-11-02 DIAGNOSIS — N186 End stage renal disease: Secondary | ICD-10-CM | POA: Diagnosis not present

## 2021-11-03 DIAGNOSIS — D509 Iron deficiency anemia, unspecified: Secondary | ICD-10-CM | POA: Diagnosis not present

## 2021-11-03 DIAGNOSIS — K769 Liver disease, unspecified: Secondary | ICD-10-CM | POA: Diagnosis not present

## 2021-11-03 DIAGNOSIS — Z23 Encounter for immunization: Secondary | ICD-10-CM | POA: Diagnosis not present

## 2021-11-03 DIAGNOSIS — N186 End stage renal disease: Secondary | ICD-10-CM | POA: Diagnosis not present

## 2021-11-03 DIAGNOSIS — R82998 Other abnormal findings in urine: Secondary | ICD-10-CM | POA: Diagnosis not present

## 2021-11-03 DIAGNOSIS — Z4932 Encounter for adequacy testing for peritoneal dialysis: Secondary | ICD-10-CM | POA: Diagnosis not present

## 2021-11-03 DIAGNOSIS — Z992 Dependence on renal dialysis: Secondary | ICD-10-CM | POA: Diagnosis not present

## 2021-11-04 DIAGNOSIS — N186 End stage renal disease: Secondary | ICD-10-CM | POA: Diagnosis not present

## 2021-11-04 DIAGNOSIS — K769 Liver disease, unspecified: Secondary | ICD-10-CM | POA: Diagnosis not present

## 2021-11-04 DIAGNOSIS — Z23 Encounter for immunization: Secondary | ICD-10-CM | POA: Diagnosis not present

## 2021-11-04 DIAGNOSIS — R82998 Other abnormal findings in urine: Secondary | ICD-10-CM | POA: Diagnosis not present

## 2021-11-04 DIAGNOSIS — D509 Iron deficiency anemia, unspecified: Secondary | ICD-10-CM | POA: Diagnosis not present

## 2021-11-04 DIAGNOSIS — Z4932 Encounter for adequacy testing for peritoneal dialysis: Secondary | ICD-10-CM | POA: Diagnosis not present

## 2021-11-04 DIAGNOSIS — Z992 Dependence on renal dialysis: Secondary | ICD-10-CM | POA: Diagnosis not present

## 2021-11-05 DIAGNOSIS — R82998 Other abnormal findings in urine: Secondary | ICD-10-CM | POA: Diagnosis not present

## 2021-11-05 DIAGNOSIS — N186 End stage renal disease: Secondary | ICD-10-CM | POA: Diagnosis not present

## 2021-11-05 DIAGNOSIS — D509 Iron deficiency anemia, unspecified: Secondary | ICD-10-CM | POA: Diagnosis not present

## 2021-11-05 DIAGNOSIS — Z992 Dependence on renal dialysis: Secondary | ICD-10-CM | POA: Diagnosis not present

## 2021-11-05 DIAGNOSIS — Z4932 Encounter for adequacy testing for peritoneal dialysis: Secondary | ICD-10-CM | POA: Diagnosis not present

## 2021-11-05 DIAGNOSIS — K769 Liver disease, unspecified: Secondary | ICD-10-CM | POA: Diagnosis not present

## 2021-11-05 DIAGNOSIS — Z23 Encounter for immunization: Secondary | ICD-10-CM | POA: Diagnosis not present

## 2021-11-06 DIAGNOSIS — K769 Liver disease, unspecified: Secondary | ICD-10-CM | POA: Diagnosis not present

## 2021-11-06 DIAGNOSIS — D509 Iron deficiency anemia, unspecified: Secondary | ICD-10-CM | POA: Diagnosis not present

## 2021-11-06 DIAGNOSIS — Z23 Encounter for immunization: Secondary | ICD-10-CM | POA: Diagnosis not present

## 2021-11-06 DIAGNOSIS — R82998 Other abnormal findings in urine: Secondary | ICD-10-CM | POA: Diagnosis not present

## 2021-11-06 DIAGNOSIS — Z4932 Encounter for adequacy testing for peritoneal dialysis: Secondary | ICD-10-CM | POA: Diagnosis not present

## 2021-11-06 DIAGNOSIS — N186 End stage renal disease: Secondary | ICD-10-CM | POA: Diagnosis not present

## 2021-11-06 DIAGNOSIS — Z992 Dependence on renal dialysis: Secondary | ICD-10-CM | POA: Diagnosis not present

## 2021-11-07 DIAGNOSIS — N186 End stage renal disease: Secondary | ICD-10-CM | POA: Diagnosis not present

## 2021-11-07 DIAGNOSIS — D509 Iron deficiency anemia, unspecified: Secondary | ICD-10-CM | POA: Diagnosis not present

## 2021-11-07 DIAGNOSIS — Z4932 Encounter for adequacy testing for peritoneal dialysis: Secondary | ICD-10-CM | POA: Diagnosis not present

## 2021-11-07 DIAGNOSIS — R82998 Other abnormal findings in urine: Secondary | ICD-10-CM | POA: Diagnosis not present

## 2021-11-07 DIAGNOSIS — K769 Liver disease, unspecified: Secondary | ICD-10-CM | POA: Diagnosis not present

## 2021-11-07 DIAGNOSIS — Z23 Encounter for immunization: Secondary | ICD-10-CM | POA: Diagnosis not present

## 2021-11-07 DIAGNOSIS — Z992 Dependence on renal dialysis: Secondary | ICD-10-CM | POA: Diagnosis not present

## 2021-11-08 DIAGNOSIS — N186 End stage renal disease: Secondary | ICD-10-CM | POA: Diagnosis not present

## 2021-11-08 DIAGNOSIS — K769 Liver disease, unspecified: Secondary | ICD-10-CM | POA: Diagnosis not present

## 2021-11-08 DIAGNOSIS — D509 Iron deficiency anemia, unspecified: Secondary | ICD-10-CM | POA: Diagnosis not present

## 2021-11-08 DIAGNOSIS — Z4932 Encounter for adequacy testing for peritoneal dialysis: Secondary | ICD-10-CM | POA: Diagnosis not present

## 2021-11-08 DIAGNOSIS — R82998 Other abnormal findings in urine: Secondary | ICD-10-CM | POA: Diagnosis not present

## 2021-11-08 DIAGNOSIS — Z23 Encounter for immunization: Secondary | ICD-10-CM | POA: Diagnosis not present

## 2021-11-08 DIAGNOSIS — Z992 Dependence on renal dialysis: Secondary | ICD-10-CM | POA: Diagnosis not present

## 2021-11-09 DIAGNOSIS — N186 End stage renal disease: Secondary | ICD-10-CM | POA: Diagnosis not present

## 2021-11-09 DIAGNOSIS — K769 Liver disease, unspecified: Secondary | ICD-10-CM | POA: Diagnosis not present

## 2021-11-09 DIAGNOSIS — Z992 Dependence on renal dialysis: Secondary | ICD-10-CM | POA: Diagnosis not present

## 2021-11-09 DIAGNOSIS — Z23 Encounter for immunization: Secondary | ICD-10-CM | POA: Diagnosis not present

## 2021-11-09 DIAGNOSIS — D509 Iron deficiency anemia, unspecified: Secondary | ICD-10-CM | POA: Diagnosis not present

## 2021-11-09 DIAGNOSIS — R82998 Other abnormal findings in urine: Secondary | ICD-10-CM | POA: Diagnosis not present

## 2021-11-09 DIAGNOSIS — Z4932 Encounter for adequacy testing for peritoneal dialysis: Secondary | ICD-10-CM | POA: Diagnosis not present

## 2021-11-10 DIAGNOSIS — Z4932 Encounter for adequacy testing for peritoneal dialysis: Secondary | ICD-10-CM | POA: Diagnosis not present

## 2021-11-10 DIAGNOSIS — N186 End stage renal disease: Secondary | ICD-10-CM | POA: Diagnosis not present

## 2021-11-10 DIAGNOSIS — D509 Iron deficiency anemia, unspecified: Secondary | ICD-10-CM | POA: Diagnosis not present

## 2021-11-10 DIAGNOSIS — K769 Liver disease, unspecified: Secondary | ICD-10-CM | POA: Diagnosis not present

## 2021-11-10 DIAGNOSIS — Z23 Encounter for immunization: Secondary | ICD-10-CM | POA: Diagnosis not present

## 2021-11-10 DIAGNOSIS — R82998 Other abnormal findings in urine: Secondary | ICD-10-CM | POA: Diagnosis not present

## 2021-11-10 DIAGNOSIS — Z992 Dependence on renal dialysis: Secondary | ICD-10-CM | POA: Diagnosis not present

## 2021-11-11 DIAGNOSIS — Z992 Dependence on renal dialysis: Secondary | ICD-10-CM | POA: Diagnosis not present

## 2021-11-11 DIAGNOSIS — Z23 Encounter for immunization: Secondary | ICD-10-CM | POA: Diagnosis not present

## 2021-11-11 DIAGNOSIS — Z4932 Encounter for adequacy testing for peritoneal dialysis: Secondary | ICD-10-CM | POA: Diagnosis not present

## 2021-11-11 DIAGNOSIS — R82998 Other abnormal findings in urine: Secondary | ICD-10-CM | POA: Diagnosis not present

## 2021-11-11 DIAGNOSIS — K769 Liver disease, unspecified: Secondary | ICD-10-CM | POA: Diagnosis not present

## 2021-11-11 DIAGNOSIS — N186 End stage renal disease: Secondary | ICD-10-CM | POA: Diagnosis not present

## 2021-11-11 DIAGNOSIS — D509 Iron deficiency anemia, unspecified: Secondary | ICD-10-CM | POA: Diagnosis not present

## 2021-11-12 DIAGNOSIS — N186 End stage renal disease: Secondary | ICD-10-CM | POA: Diagnosis not present

## 2021-11-12 DIAGNOSIS — Z992 Dependence on renal dialysis: Secondary | ICD-10-CM | POA: Diagnosis not present

## 2021-11-12 DIAGNOSIS — Z23 Encounter for immunization: Secondary | ICD-10-CM | POA: Diagnosis not present

## 2021-11-12 DIAGNOSIS — K769 Liver disease, unspecified: Secondary | ICD-10-CM | POA: Diagnosis not present

## 2021-11-12 DIAGNOSIS — R82998 Other abnormal findings in urine: Secondary | ICD-10-CM | POA: Diagnosis not present

## 2021-11-12 DIAGNOSIS — D509 Iron deficiency anemia, unspecified: Secondary | ICD-10-CM | POA: Diagnosis not present

## 2021-11-12 DIAGNOSIS — Z4932 Encounter for adequacy testing for peritoneal dialysis: Secondary | ICD-10-CM | POA: Diagnosis not present

## 2021-11-13 DIAGNOSIS — Z4932 Encounter for adequacy testing for peritoneal dialysis: Secondary | ICD-10-CM | POA: Diagnosis not present

## 2021-11-13 DIAGNOSIS — Z992 Dependence on renal dialysis: Secondary | ICD-10-CM | POA: Diagnosis not present

## 2021-11-13 DIAGNOSIS — R82998 Other abnormal findings in urine: Secondary | ICD-10-CM | POA: Diagnosis not present

## 2021-11-13 DIAGNOSIS — K769 Liver disease, unspecified: Secondary | ICD-10-CM | POA: Diagnosis not present

## 2021-11-13 DIAGNOSIS — N186 End stage renal disease: Secondary | ICD-10-CM | POA: Diagnosis not present

## 2021-11-13 DIAGNOSIS — D509 Iron deficiency anemia, unspecified: Secondary | ICD-10-CM | POA: Diagnosis not present

## 2021-11-13 DIAGNOSIS — Z23 Encounter for immunization: Secondary | ICD-10-CM | POA: Diagnosis not present

## 2021-11-14 DIAGNOSIS — N186 End stage renal disease: Secondary | ICD-10-CM | POA: Diagnosis not present

## 2021-11-14 DIAGNOSIS — Z4932 Encounter for adequacy testing for peritoneal dialysis: Secondary | ICD-10-CM | POA: Diagnosis not present

## 2021-11-14 DIAGNOSIS — D509 Iron deficiency anemia, unspecified: Secondary | ICD-10-CM | POA: Diagnosis not present

## 2021-11-14 DIAGNOSIS — Z992 Dependence on renal dialysis: Secondary | ICD-10-CM | POA: Diagnosis not present

## 2021-11-14 DIAGNOSIS — R82998 Other abnormal findings in urine: Secondary | ICD-10-CM | POA: Diagnosis not present

## 2021-11-14 DIAGNOSIS — K769 Liver disease, unspecified: Secondary | ICD-10-CM | POA: Diagnosis not present

## 2021-11-14 DIAGNOSIS — Z23 Encounter for immunization: Secondary | ICD-10-CM | POA: Diagnosis not present

## 2021-11-15 DIAGNOSIS — D509 Iron deficiency anemia, unspecified: Secondary | ICD-10-CM | POA: Diagnosis not present

## 2021-11-15 DIAGNOSIS — K769 Liver disease, unspecified: Secondary | ICD-10-CM | POA: Diagnosis not present

## 2021-11-15 DIAGNOSIS — N186 End stage renal disease: Secondary | ICD-10-CM | POA: Diagnosis not present

## 2021-11-15 DIAGNOSIS — Z992 Dependence on renal dialysis: Secondary | ICD-10-CM | POA: Diagnosis not present

## 2021-11-15 DIAGNOSIS — Z23 Encounter for immunization: Secondary | ICD-10-CM | POA: Diagnosis not present

## 2021-11-15 DIAGNOSIS — Z4932 Encounter for adequacy testing for peritoneal dialysis: Secondary | ICD-10-CM | POA: Diagnosis not present

## 2021-11-15 DIAGNOSIS — R82998 Other abnormal findings in urine: Secondary | ICD-10-CM | POA: Diagnosis not present

## 2021-11-16 DIAGNOSIS — Z4932 Encounter for adequacy testing for peritoneal dialysis: Secondary | ICD-10-CM | POA: Diagnosis not present

## 2021-11-16 DIAGNOSIS — R82998 Other abnormal findings in urine: Secondary | ICD-10-CM | POA: Diagnosis not present

## 2021-11-16 DIAGNOSIS — K769 Liver disease, unspecified: Secondary | ICD-10-CM | POA: Diagnosis not present

## 2021-11-16 DIAGNOSIS — N186 End stage renal disease: Secondary | ICD-10-CM | POA: Diagnosis not present

## 2021-11-16 DIAGNOSIS — Z992 Dependence on renal dialysis: Secondary | ICD-10-CM | POA: Diagnosis not present

## 2021-11-16 DIAGNOSIS — Z23 Encounter for immunization: Secondary | ICD-10-CM | POA: Diagnosis not present

## 2021-11-16 DIAGNOSIS — D509 Iron deficiency anemia, unspecified: Secondary | ICD-10-CM | POA: Diagnosis not present

## 2021-11-17 DIAGNOSIS — N186 End stage renal disease: Secondary | ICD-10-CM | POA: Diagnosis not present

## 2021-11-17 DIAGNOSIS — Z4932 Encounter for adequacy testing for peritoneal dialysis: Secondary | ICD-10-CM | POA: Diagnosis not present

## 2021-11-17 DIAGNOSIS — Z992 Dependence on renal dialysis: Secondary | ICD-10-CM | POA: Diagnosis not present

## 2021-11-17 DIAGNOSIS — R82998 Other abnormal findings in urine: Secondary | ICD-10-CM | POA: Diagnosis not present

## 2021-11-17 DIAGNOSIS — Z23 Encounter for immunization: Secondary | ICD-10-CM | POA: Diagnosis not present

## 2021-11-17 DIAGNOSIS — K769 Liver disease, unspecified: Secondary | ICD-10-CM | POA: Diagnosis not present

## 2021-11-17 DIAGNOSIS — D509 Iron deficiency anemia, unspecified: Secondary | ICD-10-CM | POA: Diagnosis not present

## 2021-11-18 DIAGNOSIS — Z4932 Encounter for adequacy testing for peritoneal dialysis: Secondary | ICD-10-CM | POA: Diagnosis not present

## 2021-11-18 DIAGNOSIS — Z23 Encounter for immunization: Secondary | ICD-10-CM | POA: Diagnosis not present

## 2021-11-18 DIAGNOSIS — N186 End stage renal disease: Secondary | ICD-10-CM | POA: Diagnosis not present

## 2021-11-18 DIAGNOSIS — K769 Liver disease, unspecified: Secondary | ICD-10-CM | POA: Diagnosis not present

## 2021-11-18 DIAGNOSIS — D509 Iron deficiency anemia, unspecified: Secondary | ICD-10-CM | POA: Diagnosis not present

## 2021-11-18 DIAGNOSIS — R82998 Other abnormal findings in urine: Secondary | ICD-10-CM | POA: Diagnosis not present

## 2021-11-18 DIAGNOSIS — Z992 Dependence on renal dialysis: Secondary | ICD-10-CM | POA: Diagnosis not present

## 2021-11-19 DIAGNOSIS — N186 End stage renal disease: Secondary | ICD-10-CM | POA: Diagnosis not present

## 2021-11-19 DIAGNOSIS — Z23 Encounter for immunization: Secondary | ICD-10-CM | POA: Diagnosis not present

## 2021-11-19 DIAGNOSIS — R82998 Other abnormal findings in urine: Secondary | ICD-10-CM | POA: Diagnosis not present

## 2021-11-19 DIAGNOSIS — Z992 Dependence on renal dialysis: Secondary | ICD-10-CM | POA: Diagnosis not present

## 2021-11-19 DIAGNOSIS — K769 Liver disease, unspecified: Secondary | ICD-10-CM | POA: Diagnosis not present

## 2021-11-19 DIAGNOSIS — D509 Iron deficiency anemia, unspecified: Secondary | ICD-10-CM | POA: Diagnosis not present

## 2021-11-19 DIAGNOSIS — Z4932 Encounter for adequacy testing for peritoneal dialysis: Secondary | ICD-10-CM | POA: Diagnosis not present

## 2021-11-20 DIAGNOSIS — Z4932 Encounter for adequacy testing for peritoneal dialysis: Secondary | ICD-10-CM | POA: Diagnosis not present

## 2021-11-20 DIAGNOSIS — K769 Liver disease, unspecified: Secondary | ICD-10-CM | POA: Diagnosis not present

## 2021-11-20 DIAGNOSIS — N186 End stage renal disease: Secondary | ICD-10-CM | POA: Diagnosis not present

## 2021-11-20 DIAGNOSIS — Z992 Dependence on renal dialysis: Secondary | ICD-10-CM | POA: Diagnosis not present

## 2021-11-20 DIAGNOSIS — D509 Iron deficiency anemia, unspecified: Secondary | ICD-10-CM | POA: Diagnosis not present

## 2021-11-20 DIAGNOSIS — Z23 Encounter for immunization: Secondary | ICD-10-CM | POA: Diagnosis not present

## 2021-11-20 DIAGNOSIS — R82998 Other abnormal findings in urine: Secondary | ICD-10-CM | POA: Diagnosis not present

## 2021-11-21 DIAGNOSIS — D509 Iron deficiency anemia, unspecified: Secondary | ICD-10-CM | POA: Diagnosis not present

## 2021-11-21 DIAGNOSIS — Z23 Encounter for immunization: Secondary | ICD-10-CM | POA: Diagnosis not present

## 2021-11-21 DIAGNOSIS — R82998 Other abnormal findings in urine: Secondary | ICD-10-CM | POA: Diagnosis not present

## 2021-11-21 DIAGNOSIS — Z992 Dependence on renal dialysis: Secondary | ICD-10-CM | POA: Diagnosis not present

## 2021-11-21 DIAGNOSIS — N186 End stage renal disease: Secondary | ICD-10-CM | POA: Diagnosis not present

## 2021-11-21 DIAGNOSIS — Z4932 Encounter for adequacy testing for peritoneal dialysis: Secondary | ICD-10-CM | POA: Diagnosis not present

## 2021-11-21 DIAGNOSIS — K769 Liver disease, unspecified: Secondary | ICD-10-CM | POA: Diagnosis not present

## 2021-11-22 DIAGNOSIS — R82998 Other abnormal findings in urine: Secondary | ICD-10-CM | POA: Diagnosis not present

## 2021-11-22 DIAGNOSIS — Z23 Encounter for immunization: Secondary | ICD-10-CM | POA: Diagnosis not present

## 2021-11-22 DIAGNOSIS — N186 End stage renal disease: Secondary | ICD-10-CM | POA: Diagnosis not present

## 2021-11-22 DIAGNOSIS — Z992 Dependence on renal dialysis: Secondary | ICD-10-CM | POA: Diagnosis not present

## 2021-11-22 DIAGNOSIS — D509 Iron deficiency anemia, unspecified: Secondary | ICD-10-CM | POA: Diagnosis not present

## 2021-11-22 DIAGNOSIS — Z4932 Encounter for adequacy testing for peritoneal dialysis: Secondary | ICD-10-CM | POA: Diagnosis not present

## 2021-11-22 DIAGNOSIS — K769 Liver disease, unspecified: Secondary | ICD-10-CM | POA: Diagnosis not present

## 2021-11-23 DIAGNOSIS — R82998 Other abnormal findings in urine: Secondary | ICD-10-CM | POA: Diagnosis not present

## 2021-11-23 DIAGNOSIS — K769 Liver disease, unspecified: Secondary | ICD-10-CM | POA: Diagnosis not present

## 2021-11-23 DIAGNOSIS — D509 Iron deficiency anemia, unspecified: Secondary | ICD-10-CM | POA: Diagnosis not present

## 2021-11-23 DIAGNOSIS — Z23 Encounter for immunization: Secondary | ICD-10-CM | POA: Diagnosis not present

## 2021-11-23 DIAGNOSIS — Z992 Dependence on renal dialysis: Secondary | ICD-10-CM | POA: Diagnosis not present

## 2021-11-23 DIAGNOSIS — I129 Hypertensive chronic kidney disease with stage 1 through stage 4 chronic kidney disease, or unspecified chronic kidney disease: Secondary | ICD-10-CM | POA: Diagnosis not present

## 2021-11-23 DIAGNOSIS — Z4932 Encounter for adequacy testing for peritoneal dialysis: Secondary | ICD-10-CM | POA: Diagnosis not present

## 2021-11-23 DIAGNOSIS — N186 End stage renal disease: Secondary | ICD-10-CM | POA: Diagnosis not present

## 2021-11-24 DIAGNOSIS — D631 Anemia in chronic kidney disease: Secondary | ICD-10-CM | POA: Diagnosis not present

## 2021-11-24 DIAGNOSIS — K769 Liver disease, unspecified: Secondary | ICD-10-CM | POA: Diagnosis not present

## 2021-11-24 DIAGNOSIS — Z4932 Encounter for adequacy testing for peritoneal dialysis: Secondary | ICD-10-CM | POA: Diagnosis not present

## 2021-11-24 DIAGNOSIS — D509 Iron deficiency anemia, unspecified: Secondary | ICD-10-CM | POA: Diagnosis not present

## 2021-11-24 DIAGNOSIS — Z992 Dependence on renal dialysis: Secondary | ICD-10-CM | POA: Diagnosis not present

## 2021-11-24 DIAGNOSIS — Z23 Encounter for immunization: Secondary | ICD-10-CM | POA: Diagnosis not present

## 2021-11-24 DIAGNOSIS — N186 End stage renal disease: Secondary | ICD-10-CM | POA: Diagnosis not present

## 2021-11-24 DIAGNOSIS — R82998 Other abnormal findings in urine: Secondary | ICD-10-CM | POA: Diagnosis not present

## 2021-11-25 DIAGNOSIS — R82998 Other abnormal findings in urine: Secondary | ICD-10-CM | POA: Diagnosis not present

## 2021-11-25 DIAGNOSIS — D509 Iron deficiency anemia, unspecified: Secondary | ICD-10-CM | POA: Diagnosis not present

## 2021-11-25 DIAGNOSIS — Z23 Encounter for immunization: Secondary | ICD-10-CM | POA: Diagnosis not present

## 2021-11-25 DIAGNOSIS — D631 Anemia in chronic kidney disease: Secondary | ICD-10-CM | POA: Diagnosis not present

## 2021-11-25 DIAGNOSIS — N186 End stage renal disease: Secondary | ICD-10-CM | POA: Diagnosis not present

## 2021-11-25 DIAGNOSIS — K769 Liver disease, unspecified: Secondary | ICD-10-CM | POA: Diagnosis not present

## 2021-11-25 DIAGNOSIS — Z4932 Encounter for adequacy testing for peritoneal dialysis: Secondary | ICD-10-CM | POA: Diagnosis not present

## 2021-11-25 DIAGNOSIS — Z992 Dependence on renal dialysis: Secondary | ICD-10-CM | POA: Diagnosis not present

## 2021-11-26 DIAGNOSIS — Z4932 Encounter for adequacy testing for peritoneal dialysis: Secondary | ICD-10-CM | POA: Diagnosis not present

## 2021-11-26 DIAGNOSIS — D631 Anemia in chronic kidney disease: Secondary | ICD-10-CM | POA: Diagnosis not present

## 2021-11-26 DIAGNOSIS — K769 Liver disease, unspecified: Secondary | ICD-10-CM | POA: Diagnosis not present

## 2021-11-26 DIAGNOSIS — D509 Iron deficiency anemia, unspecified: Secondary | ICD-10-CM | POA: Diagnosis not present

## 2021-11-26 DIAGNOSIS — Z992 Dependence on renal dialysis: Secondary | ICD-10-CM | POA: Diagnosis not present

## 2021-11-26 DIAGNOSIS — Z23 Encounter for immunization: Secondary | ICD-10-CM | POA: Diagnosis not present

## 2021-11-26 DIAGNOSIS — R82998 Other abnormal findings in urine: Secondary | ICD-10-CM | POA: Diagnosis not present

## 2021-11-26 DIAGNOSIS — N186 End stage renal disease: Secondary | ICD-10-CM | POA: Diagnosis not present

## 2021-11-27 DIAGNOSIS — D509 Iron deficiency anemia, unspecified: Secondary | ICD-10-CM | POA: Diagnosis not present

## 2021-11-27 DIAGNOSIS — D631 Anemia in chronic kidney disease: Secondary | ICD-10-CM | POA: Diagnosis not present

## 2021-11-27 DIAGNOSIS — R82998 Other abnormal findings in urine: Secondary | ICD-10-CM | POA: Diagnosis not present

## 2021-11-27 DIAGNOSIS — Z23 Encounter for immunization: Secondary | ICD-10-CM | POA: Diagnosis not present

## 2021-11-27 DIAGNOSIS — K769 Liver disease, unspecified: Secondary | ICD-10-CM | POA: Diagnosis not present

## 2021-11-27 DIAGNOSIS — N186 End stage renal disease: Secondary | ICD-10-CM | POA: Diagnosis not present

## 2021-11-27 DIAGNOSIS — Z992 Dependence on renal dialysis: Secondary | ICD-10-CM | POA: Diagnosis not present

## 2021-11-27 DIAGNOSIS — Z4932 Encounter for adequacy testing for peritoneal dialysis: Secondary | ICD-10-CM | POA: Diagnosis not present

## 2021-11-28 DIAGNOSIS — Z4932 Encounter for adequacy testing for peritoneal dialysis: Secondary | ICD-10-CM | POA: Diagnosis not present

## 2021-11-28 DIAGNOSIS — Z992 Dependence on renal dialysis: Secondary | ICD-10-CM | POA: Diagnosis not present

## 2021-11-28 DIAGNOSIS — R82998 Other abnormal findings in urine: Secondary | ICD-10-CM | POA: Diagnosis not present

## 2021-11-28 DIAGNOSIS — N186 End stage renal disease: Secondary | ICD-10-CM | POA: Diagnosis not present

## 2021-11-28 DIAGNOSIS — D631 Anemia in chronic kidney disease: Secondary | ICD-10-CM | POA: Diagnosis not present

## 2021-11-28 DIAGNOSIS — D509 Iron deficiency anemia, unspecified: Secondary | ICD-10-CM | POA: Diagnosis not present

## 2021-11-28 DIAGNOSIS — K769 Liver disease, unspecified: Secondary | ICD-10-CM | POA: Diagnosis not present

## 2021-11-28 DIAGNOSIS — Z23 Encounter for immunization: Secondary | ICD-10-CM | POA: Diagnosis not present

## 2021-11-29 DIAGNOSIS — Z4932 Encounter for adequacy testing for peritoneal dialysis: Secondary | ICD-10-CM | POA: Diagnosis not present

## 2021-11-29 DIAGNOSIS — D631 Anemia in chronic kidney disease: Secondary | ICD-10-CM | POA: Diagnosis not present

## 2021-11-29 DIAGNOSIS — R82998 Other abnormal findings in urine: Secondary | ICD-10-CM | POA: Diagnosis not present

## 2021-11-29 DIAGNOSIS — K769 Liver disease, unspecified: Secondary | ICD-10-CM | POA: Diagnosis not present

## 2021-11-29 DIAGNOSIS — Z23 Encounter for immunization: Secondary | ICD-10-CM | POA: Diagnosis not present

## 2021-11-29 DIAGNOSIS — D509 Iron deficiency anemia, unspecified: Secondary | ICD-10-CM | POA: Diagnosis not present

## 2021-11-29 DIAGNOSIS — Z992 Dependence on renal dialysis: Secondary | ICD-10-CM | POA: Diagnosis not present

## 2021-11-29 DIAGNOSIS — N186 End stage renal disease: Secondary | ICD-10-CM | POA: Diagnosis not present

## 2021-11-30 DIAGNOSIS — D631 Anemia in chronic kidney disease: Secondary | ICD-10-CM | POA: Diagnosis not present

## 2021-11-30 DIAGNOSIS — N186 End stage renal disease: Secondary | ICD-10-CM | POA: Diagnosis not present

## 2021-11-30 DIAGNOSIS — Z4932 Encounter for adequacy testing for peritoneal dialysis: Secondary | ICD-10-CM | POA: Diagnosis not present

## 2021-11-30 DIAGNOSIS — Z992 Dependence on renal dialysis: Secondary | ICD-10-CM | POA: Diagnosis not present

## 2021-11-30 DIAGNOSIS — D509 Iron deficiency anemia, unspecified: Secondary | ICD-10-CM | POA: Diagnosis not present

## 2021-11-30 DIAGNOSIS — Z23 Encounter for immunization: Secondary | ICD-10-CM | POA: Diagnosis not present

## 2021-11-30 DIAGNOSIS — R82998 Other abnormal findings in urine: Secondary | ICD-10-CM | POA: Diagnosis not present

## 2021-11-30 DIAGNOSIS — K769 Liver disease, unspecified: Secondary | ICD-10-CM | POA: Diagnosis not present

## 2021-12-01 DIAGNOSIS — N186 End stage renal disease: Secondary | ICD-10-CM | POA: Diagnosis not present

## 2021-12-01 DIAGNOSIS — K769 Liver disease, unspecified: Secondary | ICD-10-CM | POA: Diagnosis not present

## 2021-12-01 DIAGNOSIS — Z992 Dependence on renal dialysis: Secondary | ICD-10-CM | POA: Diagnosis not present

## 2021-12-01 DIAGNOSIS — R82998 Other abnormal findings in urine: Secondary | ICD-10-CM | POA: Diagnosis not present

## 2021-12-01 DIAGNOSIS — Z4932 Encounter for adequacy testing for peritoneal dialysis: Secondary | ICD-10-CM | POA: Diagnosis not present

## 2021-12-01 DIAGNOSIS — D631 Anemia in chronic kidney disease: Secondary | ICD-10-CM | POA: Diagnosis not present

## 2021-12-01 DIAGNOSIS — Z23 Encounter for immunization: Secondary | ICD-10-CM | POA: Diagnosis not present

## 2021-12-01 DIAGNOSIS — D509 Iron deficiency anemia, unspecified: Secondary | ICD-10-CM | POA: Diagnosis not present

## 2021-12-02 DIAGNOSIS — Z4932 Encounter for adequacy testing for peritoneal dialysis: Secondary | ICD-10-CM | POA: Diagnosis not present

## 2021-12-02 DIAGNOSIS — Z992 Dependence on renal dialysis: Secondary | ICD-10-CM | POA: Diagnosis not present

## 2021-12-02 DIAGNOSIS — D631 Anemia in chronic kidney disease: Secondary | ICD-10-CM | POA: Diagnosis not present

## 2021-12-02 DIAGNOSIS — D509 Iron deficiency anemia, unspecified: Secondary | ICD-10-CM | POA: Diagnosis not present

## 2021-12-02 DIAGNOSIS — N186 End stage renal disease: Secondary | ICD-10-CM | POA: Diagnosis not present

## 2021-12-02 DIAGNOSIS — Z23 Encounter for immunization: Secondary | ICD-10-CM | POA: Diagnosis not present

## 2021-12-02 DIAGNOSIS — R82998 Other abnormal findings in urine: Secondary | ICD-10-CM | POA: Diagnosis not present

## 2021-12-02 DIAGNOSIS — K769 Liver disease, unspecified: Secondary | ICD-10-CM | POA: Diagnosis not present

## 2021-12-03 DIAGNOSIS — R82998 Other abnormal findings in urine: Secondary | ICD-10-CM | POA: Diagnosis not present

## 2021-12-03 DIAGNOSIS — Z4932 Encounter for adequacy testing for peritoneal dialysis: Secondary | ICD-10-CM | POA: Diagnosis not present

## 2021-12-03 DIAGNOSIS — N186 End stage renal disease: Secondary | ICD-10-CM | POA: Diagnosis not present

## 2021-12-03 DIAGNOSIS — Z992 Dependence on renal dialysis: Secondary | ICD-10-CM | POA: Diagnosis not present

## 2021-12-03 DIAGNOSIS — K769 Liver disease, unspecified: Secondary | ICD-10-CM | POA: Diagnosis not present

## 2021-12-03 DIAGNOSIS — D631 Anemia in chronic kidney disease: Secondary | ICD-10-CM | POA: Diagnosis not present

## 2021-12-03 DIAGNOSIS — D509 Iron deficiency anemia, unspecified: Secondary | ICD-10-CM | POA: Diagnosis not present

## 2021-12-03 DIAGNOSIS — Z23 Encounter for immunization: Secondary | ICD-10-CM | POA: Diagnosis not present

## 2021-12-04 DIAGNOSIS — D509 Iron deficiency anemia, unspecified: Secondary | ICD-10-CM | POA: Diagnosis not present

## 2021-12-04 DIAGNOSIS — N186 End stage renal disease: Secondary | ICD-10-CM | POA: Diagnosis not present

## 2021-12-04 DIAGNOSIS — Z992 Dependence on renal dialysis: Secondary | ICD-10-CM | POA: Diagnosis not present

## 2021-12-04 DIAGNOSIS — Z23 Encounter for immunization: Secondary | ICD-10-CM | POA: Diagnosis not present

## 2021-12-04 DIAGNOSIS — R82998 Other abnormal findings in urine: Secondary | ICD-10-CM | POA: Diagnosis not present

## 2021-12-04 DIAGNOSIS — Z4932 Encounter for adequacy testing for peritoneal dialysis: Secondary | ICD-10-CM | POA: Diagnosis not present

## 2021-12-04 DIAGNOSIS — K769 Liver disease, unspecified: Secondary | ICD-10-CM | POA: Diagnosis not present

## 2021-12-04 DIAGNOSIS — D631 Anemia in chronic kidney disease: Secondary | ICD-10-CM | POA: Diagnosis not present

## 2021-12-05 DIAGNOSIS — R82998 Other abnormal findings in urine: Secondary | ICD-10-CM | POA: Diagnosis not present

## 2021-12-05 DIAGNOSIS — Z4932 Encounter for adequacy testing for peritoneal dialysis: Secondary | ICD-10-CM | POA: Diagnosis not present

## 2021-12-05 DIAGNOSIS — D631 Anemia in chronic kidney disease: Secondary | ICD-10-CM | POA: Diagnosis not present

## 2021-12-05 DIAGNOSIS — D509 Iron deficiency anemia, unspecified: Secondary | ICD-10-CM | POA: Diagnosis not present

## 2021-12-05 DIAGNOSIS — K769 Liver disease, unspecified: Secondary | ICD-10-CM | POA: Diagnosis not present

## 2021-12-05 DIAGNOSIS — Z23 Encounter for immunization: Secondary | ICD-10-CM | POA: Diagnosis not present

## 2021-12-05 DIAGNOSIS — Z992 Dependence on renal dialysis: Secondary | ICD-10-CM | POA: Diagnosis not present

## 2021-12-05 DIAGNOSIS — N186 End stage renal disease: Secondary | ICD-10-CM | POA: Diagnosis not present

## 2021-12-06 DIAGNOSIS — D631 Anemia in chronic kidney disease: Secondary | ICD-10-CM | POA: Diagnosis not present

## 2021-12-06 DIAGNOSIS — K769 Liver disease, unspecified: Secondary | ICD-10-CM | POA: Diagnosis not present

## 2021-12-06 DIAGNOSIS — D509 Iron deficiency anemia, unspecified: Secondary | ICD-10-CM | POA: Diagnosis not present

## 2021-12-06 DIAGNOSIS — Z992 Dependence on renal dialysis: Secondary | ICD-10-CM | POA: Diagnosis not present

## 2021-12-06 DIAGNOSIS — R82998 Other abnormal findings in urine: Secondary | ICD-10-CM | POA: Diagnosis not present

## 2021-12-06 DIAGNOSIS — Z4932 Encounter for adequacy testing for peritoneal dialysis: Secondary | ICD-10-CM | POA: Diagnosis not present

## 2021-12-06 DIAGNOSIS — Z23 Encounter for immunization: Secondary | ICD-10-CM | POA: Diagnosis not present

## 2021-12-06 DIAGNOSIS — N186 End stage renal disease: Secondary | ICD-10-CM | POA: Diagnosis not present

## 2021-12-07 DIAGNOSIS — K769 Liver disease, unspecified: Secondary | ICD-10-CM | POA: Diagnosis not present

## 2021-12-07 DIAGNOSIS — D631 Anemia in chronic kidney disease: Secondary | ICD-10-CM | POA: Diagnosis not present

## 2021-12-07 DIAGNOSIS — N186 End stage renal disease: Secondary | ICD-10-CM | POA: Diagnosis not present

## 2021-12-07 DIAGNOSIS — Z4932 Encounter for adequacy testing for peritoneal dialysis: Secondary | ICD-10-CM | POA: Diagnosis not present

## 2021-12-07 DIAGNOSIS — Z992 Dependence on renal dialysis: Secondary | ICD-10-CM | POA: Diagnosis not present

## 2021-12-07 DIAGNOSIS — Z23 Encounter for immunization: Secondary | ICD-10-CM | POA: Diagnosis not present

## 2021-12-07 DIAGNOSIS — D509 Iron deficiency anemia, unspecified: Secondary | ICD-10-CM | POA: Diagnosis not present

## 2021-12-07 DIAGNOSIS — R82998 Other abnormal findings in urine: Secondary | ICD-10-CM | POA: Diagnosis not present

## 2021-12-08 DIAGNOSIS — N186 End stage renal disease: Secondary | ICD-10-CM | POA: Diagnosis not present

## 2021-12-08 DIAGNOSIS — Z23 Encounter for immunization: Secondary | ICD-10-CM | POA: Diagnosis not present

## 2021-12-08 DIAGNOSIS — R82998 Other abnormal findings in urine: Secondary | ICD-10-CM | POA: Diagnosis not present

## 2021-12-08 DIAGNOSIS — K769 Liver disease, unspecified: Secondary | ICD-10-CM | POA: Diagnosis not present

## 2021-12-08 DIAGNOSIS — D631 Anemia in chronic kidney disease: Secondary | ICD-10-CM | POA: Diagnosis not present

## 2021-12-08 DIAGNOSIS — Z992 Dependence on renal dialysis: Secondary | ICD-10-CM | POA: Diagnosis not present

## 2021-12-08 DIAGNOSIS — D509 Iron deficiency anemia, unspecified: Secondary | ICD-10-CM | POA: Diagnosis not present

## 2021-12-08 DIAGNOSIS — Z4932 Encounter for adequacy testing for peritoneal dialysis: Secondary | ICD-10-CM | POA: Diagnosis not present

## 2021-12-09 DIAGNOSIS — Z23 Encounter for immunization: Secondary | ICD-10-CM | POA: Diagnosis not present

## 2021-12-09 DIAGNOSIS — K769 Liver disease, unspecified: Secondary | ICD-10-CM | POA: Diagnosis not present

## 2021-12-09 DIAGNOSIS — N186 End stage renal disease: Secondary | ICD-10-CM | POA: Diagnosis not present

## 2021-12-09 DIAGNOSIS — Z992 Dependence on renal dialysis: Secondary | ICD-10-CM | POA: Diagnosis not present

## 2021-12-09 DIAGNOSIS — Z4932 Encounter for adequacy testing for peritoneal dialysis: Secondary | ICD-10-CM | POA: Diagnosis not present

## 2021-12-09 DIAGNOSIS — R82998 Other abnormal findings in urine: Secondary | ICD-10-CM | POA: Diagnosis not present

## 2021-12-09 DIAGNOSIS — D509 Iron deficiency anemia, unspecified: Secondary | ICD-10-CM | POA: Diagnosis not present

## 2021-12-09 DIAGNOSIS — D631 Anemia in chronic kidney disease: Secondary | ICD-10-CM | POA: Diagnosis not present

## 2021-12-10 DIAGNOSIS — Z992 Dependence on renal dialysis: Secondary | ICD-10-CM | POA: Diagnosis not present

## 2021-12-10 DIAGNOSIS — D509 Iron deficiency anemia, unspecified: Secondary | ICD-10-CM | POA: Diagnosis not present

## 2021-12-10 DIAGNOSIS — D631 Anemia in chronic kidney disease: Secondary | ICD-10-CM | POA: Diagnosis not present

## 2021-12-10 DIAGNOSIS — N186 End stage renal disease: Secondary | ICD-10-CM | POA: Diagnosis not present

## 2021-12-10 DIAGNOSIS — R82998 Other abnormal findings in urine: Secondary | ICD-10-CM | POA: Diagnosis not present

## 2021-12-10 DIAGNOSIS — K769 Liver disease, unspecified: Secondary | ICD-10-CM | POA: Diagnosis not present

## 2021-12-10 DIAGNOSIS — Z4932 Encounter for adequacy testing for peritoneal dialysis: Secondary | ICD-10-CM | POA: Diagnosis not present

## 2021-12-10 DIAGNOSIS — Z23 Encounter for immunization: Secondary | ICD-10-CM | POA: Diagnosis not present

## 2021-12-11 DIAGNOSIS — Z4932 Encounter for adequacy testing for peritoneal dialysis: Secondary | ICD-10-CM | POA: Diagnosis not present

## 2021-12-11 DIAGNOSIS — Z992 Dependence on renal dialysis: Secondary | ICD-10-CM | POA: Diagnosis not present

## 2021-12-11 DIAGNOSIS — D509 Iron deficiency anemia, unspecified: Secondary | ICD-10-CM | POA: Diagnosis not present

## 2021-12-11 DIAGNOSIS — N186 End stage renal disease: Secondary | ICD-10-CM | POA: Diagnosis not present

## 2021-12-11 DIAGNOSIS — K769 Liver disease, unspecified: Secondary | ICD-10-CM | POA: Diagnosis not present

## 2021-12-11 DIAGNOSIS — Z23 Encounter for immunization: Secondary | ICD-10-CM | POA: Diagnosis not present

## 2021-12-11 DIAGNOSIS — D631 Anemia in chronic kidney disease: Secondary | ICD-10-CM | POA: Diagnosis not present

## 2021-12-11 DIAGNOSIS — R82998 Other abnormal findings in urine: Secondary | ICD-10-CM | POA: Diagnosis not present

## 2021-12-12 DIAGNOSIS — R82998 Other abnormal findings in urine: Secondary | ICD-10-CM | POA: Diagnosis not present

## 2021-12-12 DIAGNOSIS — Z23 Encounter for immunization: Secondary | ICD-10-CM | POA: Diagnosis not present

## 2021-12-12 DIAGNOSIS — D509 Iron deficiency anemia, unspecified: Secondary | ICD-10-CM | POA: Diagnosis not present

## 2021-12-12 DIAGNOSIS — N186 End stage renal disease: Secondary | ICD-10-CM | POA: Diagnosis not present

## 2021-12-12 DIAGNOSIS — Z4932 Encounter for adequacy testing for peritoneal dialysis: Secondary | ICD-10-CM | POA: Diagnosis not present

## 2021-12-12 DIAGNOSIS — Z992 Dependence on renal dialysis: Secondary | ICD-10-CM | POA: Diagnosis not present

## 2021-12-12 DIAGNOSIS — K769 Liver disease, unspecified: Secondary | ICD-10-CM | POA: Diagnosis not present

## 2021-12-12 DIAGNOSIS — D631 Anemia in chronic kidney disease: Secondary | ICD-10-CM | POA: Diagnosis not present

## 2021-12-13 ENCOUNTER — Other Ambulatory Visit: Payer: Self-pay

## 2021-12-13 ENCOUNTER — Ambulatory Visit (INDEPENDENT_AMBULATORY_CARE_PROVIDER_SITE_OTHER): Payer: Medicare Other

## 2021-12-13 ENCOUNTER — Telehealth: Payer: Self-pay | Admitting: Cardiovascular Disease

## 2021-12-13 ENCOUNTER — Encounter: Payer: Self-pay | Admitting: Nurse Practitioner

## 2021-12-13 ENCOUNTER — Ambulatory Visit: Payer: Medicare Other | Admitting: Nurse Practitioner

## 2021-12-13 VITALS — BP 90/58 | HR 68 | Ht 62.0 in | Wt 124.0 lb

## 2021-12-13 DIAGNOSIS — N186 End stage renal disease: Secondary | ICD-10-CM | POA: Diagnosis not present

## 2021-12-13 DIAGNOSIS — Z23 Encounter for immunization: Secondary | ICD-10-CM | POA: Diagnosis not present

## 2021-12-13 DIAGNOSIS — N189 Chronic kidney disease, unspecified: Secondary | ICD-10-CM

## 2021-12-13 DIAGNOSIS — K769 Liver disease, unspecified: Secondary | ICD-10-CM | POA: Diagnosis not present

## 2021-12-13 DIAGNOSIS — R002 Palpitations: Secondary | ICD-10-CM

## 2021-12-13 DIAGNOSIS — Z992 Dependence on renal dialysis: Secondary | ICD-10-CM | POA: Diagnosis not present

## 2021-12-13 DIAGNOSIS — D631 Anemia in chronic kidney disease: Secondary | ICD-10-CM | POA: Diagnosis not present

## 2021-12-13 DIAGNOSIS — R82998 Other abnormal findings in urine: Secondary | ICD-10-CM | POA: Diagnosis not present

## 2021-12-13 DIAGNOSIS — R55 Syncope and collapse: Secondary | ICD-10-CM | POA: Diagnosis not present

## 2021-12-13 DIAGNOSIS — Z4932 Encounter for adequacy testing for peritoneal dialysis: Secondary | ICD-10-CM | POA: Diagnosis not present

## 2021-12-13 DIAGNOSIS — D509 Iron deficiency anemia, unspecified: Secondary | ICD-10-CM | POA: Diagnosis not present

## 2021-12-13 DIAGNOSIS — N184 Chronic kidney disease, stage 4 (severe): Secondary | ICD-10-CM

## 2021-12-13 NOTE — Patient Instructions (Signed)
Medication Instructions:  STOP Metoprolol Succinate (Toprol-XL) *If you need a refill on your cardiac medications before your next appointment, please call your pharmacy*  Lab Work: Your physician recommends that you return for lab work TODAY:  BMET CBC If you have labs (blood work) drawn today and your tests are completely normal, you will receive your results only by: [place] (if you have MyChart) OR A paper copy in the mail If you have any lab test that is abnormal or we need to change your treatment, we will call you to review the results.  Testing/Procedures: Your physician has requested that you have an echocardiogram. Echocardiography is a painless test that uses sound waves to create images of your heart. It provides your doctor with information about the size and shape of your heart and how well your hearts chambers and valves are working. This procedure takes approximately one hour. There are no restrictions for this procedure.  ZIO XT- Long Term Monitor Instructions  Your physician has requested you wear a ZIO patch monitor for 14 days.  This is a single patch monitor. [employer] supplies one patch monitor per enrollment. Additional stickers are not available. Please do not apply patch if you will be having a Nuclear Stress Test,  Echocardiogram, Cardiac CT, MRI, or Chest Xray during the period you would be wearing the  monitor. The patch cannot be worn during these tests. You cannot remove and re-apply the  ZIO XT patch monitor.  Your ZIO patch monitor will be mailed 3 day USPS to your address on file. It may take 3-5 days  to receive your monitor after you have been enrolled.  Once you have received your monitor, please review the enclosed instructions. Your monitor  has already been registered assigning a specific monitor serial # to you.  Billing and Patient Assistance Program Information  We have supplied [employer] with any of your insurance information on file for  billing purposes. [employer] offers a sliding scale Patient Assistance Program for patients that do not have  insurance, or whose insurance does not completely cover the cost of the ZIO monitor.  You must apply for the Patient Assistance Program to qualify for this discounted rate.  To apply, please call [employer] at [phone], select option 4, select option 2, ask to apply for  Patient Assistance Program. Theodore Demark will ask your household income, and how many people  are in your household. They will quote your out-of-pocket cost based on that information.  [employer] will also be able to set up a 9-month, interest-free payment plan if needed.  Applying the monitor   Shave hair from upper left chest.  Hold abrader disc by orange tab. Rub abrader in 40 strokes over the upper left chest as  indicated in your monitor instructions.  Clean area with 4 enclosed alcohol pads. Let dry.  Apply patch as indicated in monitor instructions. Patch will be placed under collarbone on left  side of chest with arrow pointing upward.  Rub patch adhesive wings for 2 minutes. Remove white label marked "1". Remove the white  label marked "2". Rub patch adhesive wings for 2 additional minutes.  While looking in a mirror, press and release button in center of patch. A small green light will  flash 3-4 times. This will be your only indicator that the monitor has been turned on.  Do not shower for the first 24 hours. You may shower after the first 24 hours.  Press the button if you feel a  symptom. You will hear a small click. Record Date, Time and  Symptom in the Patient Logbook.  When you are ready to remove the patch, follow instructions on the last 2 pages of Patient  Logbook. Stick patch monitor onto the last page of Patient Logbook.  Place Patient Logbook in the blue and white box. Use locking tab on box and tape box closed  securely. The blue and white box has prepaid postage on it. Please place it in the mailbox  as  soon as possible. Your physician should have your test results approximately 7 days after the  monitor has been mailed back to [facility].  Call [place] at [phone] if you have questions regarding  your ZIO XT patch monitor. Call them immediately if you see an orange light blinking on your  monitor.  If your monitor falls off in less than 4 days, contact our Monitor department at [phone].  If your monitor becomes loose or falls off after 4 days call [employer] at [phone] for  suggestions on securing your monitor  Follow-Up: At [facility], you and your health needs are our priority.  As part of our continuing mission to provide you with exceptional heart care, we have created designated Provider Care Teams.  These Care Teams include your primary Cardiologist (physician) and Advanced Practice Providers (APPs -  Physician Assistants and Nurse Practitioners) who all work together to provide you with the care you need, when you need it.  Your next appointment:   4-5 week(s)  The format for your next appointment:   In Person  Provider:   Sanda Klein, MD  or APP   Other Instructions Call 911 if you experience any passing out episode to be taken to the nearest emergency department

## 2021-12-13 NOTE — Progress Notes (Unsigned)
Enrolled patient for a 14 day Zio XT monitor to be mailed to patients home  Dr Croitoru to read 

## 2021-12-13 NOTE — Telephone Encounter (Signed)
Called pt unable to leave message- Mailbox is full.

## 2021-12-13 NOTE — Telephone Encounter (Signed)
Pt c/o BP issue: STAT if pt c/o blurred vision, one-sided weakness or slurred speech  1. What are your last 5 BP readings?  Been running below 100 for about a month   2. Are you having any other symptoms (ex. Dizziness, headache, blurred vision, passed out)? Weak, dizziness, and passed out on Sunday   3. What is your BP issue? Hypotension. Place where she gets dialysis advised her to schedule an appt. Will be seeing Isaac Laud today at 3:10 PM. They stopped her atorvastatin a few weeks ago due to her low HR and BP.     STAT if patient feels like he/she is going to faint   Are you dizzy now? A little lightheaded   Do you feel faint or have you passed out? Passed out 12/11/21  Do you have any other symptoms? Weak and hypotension   Have you checked your HR and BP (record if available)?  See above telephone note

## 2021-12-13 NOTE — Progress Notes (Signed)
Cardiology Office Note:    Date:  12/13/2021   ID:  Monica Hunt, DOB November 27, 1941, MRN [id number]  PCP:  Lavone Orn, MD   [facility] Providers Cardiologist:  Sanda Klein, MD     Referring MD: Lavone Orn, MD   Chief complaint: syncope, hypotension  History of Present Illness:    Monica Hunt is a 80 y.o. female with a hx of HTN, DM, CKD Stage IV on peritoneal dialysis, syncope, and hyperlipidemia.   She initially established care with our group in May 2018 with Dr. Sallyanne Kuster for evaluation of syncope, referred by PCP. She experienced syncope while having bowel movements. Cardiac monitor revealed persistent bradycardia and her beta blocker was reduced and she reported no further episodes of syncope. Echocardiogram was essentially normal with no cardiac indication for LOC. She underwent an ETT in January 2020 due to her own concern that she would develop CAD due to hx of DM. She had a normal treadmill stress test and no further work-up was ordered. In 2021 she returned for evaluation of elevated BP. Patient reported that her nephrologist had recently discontinued her amlodipine and notes from Dr. Moshe Cipro were obtained which indicated medication changes that were different than what patient reported. She was last seen in our office on 07/15/21 by Dr. Sallyanne Kuster. At that visit she was preparing to start peritoneal dialysis and she was cleared for surgery to place Tenckhoff catheter. A 12 month f/u was recommended.  Today, she is here alone. She called our office to report low BP readings and syncope event 2 days ago. She was advised to stop atorvastatin per patient. She reports low systolic BP readings at home of 90 or less for at least 1 week. States she has fatigue and weakness, especially when she wakes up in the morning.  She has started peritoneal dialysis, and states that her fluid dosage is based on her blood pressure. Two days ago she found herself on the floor. She states that  she thinks that she got up from the chair to walk to the kitchen but she just woke up on the floor. She denies prodromal symptoms or loss of bladder or bowel function. She states this occurred at approximately 3:15 in the afternoon and she had been awake since approximately 8 AM.  She denies dizziness. Has occasional lightheadedness especially with position changes.  Admit that she only drinks about 24 to 30 ounces of water daily.  Additionally she reports that she has occasional palpitations and has a flushed feeling. She denies chest pain, shortness of breath, lower extremity edema, melena, hematuria, hemoptysis, diaphoresis, orthopnea, and PND.  Currently she only takes metoprolol for cardiac medications.    Past Medical History:  Diagnosis Date   Cataracts, bilateral    Depression    Diabetes mellitus without complication ([place])    Hypertension     Past Surgical History:  Procedure Laterality Date   ABDOMINAL HYSTERECTOMY     vaginal   CARPAL TUNNEL RELEASE Right    COLONOSCOPY WITH PROPOFOL N/A 11/17/2014   Procedure: COLONOSCOPY WITH PROPOFOL;  Surgeon: Garlan Fair, MD;  Location: [facility];  Service: Endoscopy;  Laterality: N/A;    Current Medications: Current Meds  Medication Sig   ACCU-CHEK SMARTVIEW test strip    atorvastatin (LIPITOR) 40 MG tablet Take 40 mg by mouth daily.   cholecalciferol (VITAMIN D) 1000 UNITS tablet Take 1,000 Units by mouth daily.   clonazePAM (KLONOPIN) 0.5 MG tablet Take 0.5 mg by mouth 2 (  two) times daily as needed for anxiety.   EPINEPHrine 0.3 mg/0.3 mL IJ SOAJ injection Inject 0.3 mg into the muscle once.   furosemide (LASIX) 40 MG tablet Take 40 mg by mouth as needed.   gentamicin cream (GARAMYCIN) 0.1 % Apply topically.   glipiZIDE (GLUCOTROL XL) 2.5 MG 24 hr tablet Take by mouth.   IRON PO Take by mouth.   methimazole (TAPAZOLE) 5 MG tablet Take 5 mg by mouth daily. Take 1 and 1/2 tab alternating once a day   Multiple Vitamin  (MULTIVITAMIN WITH MINERALS) TABS tablet Take 1 tablet by mouth daily.   omeprazole (PRILOSEC) 40 MG capsule Take 40 mg by mouth daily.   rOPINIRole (REQUIP) 3 MG tablet Take 3 mg by mouth at bedtime. Take 1 tab 1 to 3 hours before bedtime   traZODone (DESYREL) 50 MG tablet 50 mg. Take 1-2 tabs daily at bedtime   [DISCONTINUED] metoprolol succinate (TOPROL-XL) 25 MG 24 hr tablet Take 1 tablet (25 mg total) by mouth daily. Take with or immediately following a meal.     Allergies:   Bee venom and Other   Social History   Socioeconomic History   Marital status: Divorced    Spouse name: Not on file   Number of children: Not on file   Years of education: Not on file   Highest education level: Not on file  Occupational History   Not on file  Tobacco Use   Smoking status: Every Day    Packs/day: 0.50    Years: 45.00    Pack years: 22.50    Types: Cigarettes   Smokeless tobacco: Never  Vaping Use   Vaping Use: Never used  Substance and Sexual Activity   Alcohol use: No   Drug use: No   Sexual activity: Not on file  Other Topics Concern   Not on file  Social History Narrative   Not on file   Social Determinants of Health   Financial Resource Strain: Not on file  Food Insecurity: Not on file  Transportation Needs: Not on file  Physical Activity: Not on file  Stress: Not on file  Social Connections: Not on file     Family History: The patient's family history includes Kidney cancer in her mother; Lung cancer in her father.   ROS:   Please see the history of present illness.  ++pain to right side of head s/p fall 2 days ago All other systems reviewed and are negative.  Labs/Other Studies Reviewed:    The following studies were reviewed today:  Exercise Tolerance Test 1/20  Blood pressure demonstrated a normal response to exercise. There was no ST segment deviation noted during stress.   Normal ETT Rare PVCls with stress and in recovery Patient only achieved 87%  of PMHR  Recent Labs: No results found for requested labs within last 8760 hours.  Recent Lipid Panel No results found for: CHOL, TRIG, HDL, CHOLHDL, VLDL, LDLCALC, LDLDIRECT    Physical Exam:    VS:  BP (!) 90/58    Pulse 68    Ht 5\' 2"  (1.575 m)    Wt 124 lb (56.2 kg)    SpO2 98%    BMI 22.68 kg/m     Wt Readings from Last 3 Encounters:  12/13/21 124 lb (56.2 kg)  09/14/21 126 lb (57.2 kg)  07/15/21 126 lb 9.6 oz (57.4 kg)     GEN:  Well nourished, well developed in no acute distress HEENT: Normal NECK: No  JVD; No carotid bruits LYMPHATICS: No lymphadenopathy CARDIAC: RRR, no murmurs, rubs, gallops RESPIRATORY:  Clear to auscultation without rales, wheezing or rhonchi  ABDOMEN: Soft, non-tender, non-distended MUSCULOSKELETAL:  No edema; No deformity  SKIN: Warm and dry NEUROLOGIC:  Alert and oriented x 3 PSYCHIATRIC:  Normal affect   EKG:  EKG is ordered today.  The ekg ordered today demonstrates NSR at rate of 65 bpm, poor RWP, no ST/TW abnormality  Diagnoses:    1. Syncope, unspecified syncope type   2. Palpitations   3. CKD (chronic kidney disease) stage 4, GFR 15-29 ml/min (HCC)   4. CKD, patient preferred treatment modality peritoneal dialysis    Assessment and Plan:     Syncope: Symptoms are different than previous episode of syncope.  She denies any prodromal symptoms prior, however she has been experiencing hypotension recently.  I reviewed the case with Dr. Sallyanne Kuster, DOD, and we will place a 14-day monitor, get an echocardiogram and discontinue metoprolol.  Additionally, will get CBC and bmet today to r/o anemia and electrolyte abnormality.  Advised her to seek immediate medical attention if this occurs again.  Palpitations: She reports occasional symptoms of chest fluttering that make her feel flushed.  The symptoms did not occur at the time of her syncopal event. Previous monitor in 2018 demonstrated sinus bradycardia, no arrhythmia. Her metoprolol dose was  reduced at this time due to particularly low HR readings following administration of metoprolol. She has continued to take 25 mg daily since 7/18 without further episode of syncope until 12/11/21.  We will discontinue metoprolol in the setting of syncope and hypotension.  Hypotension: Orthostatic BP readings done in the office reveal lower BP when laying supine. She denied dizziness or lightheadedness with position change. She admits that she does not eat and drink enough. She lives alone. Advised her to work on improving her nutrition and hydration throughout the day, especially since she is now on PD.   CKD Stage 4 on peritoneal dialysis: She appears to be dry.  Checking bmet today.We will forward note to Dr. Moshe Cipro, nephrologist   Disposition: 14 day Zio, Echo, 1 mo f/u with Dr. Sallyanne Kuster or APP     Medication Adjustments/Labs and Tests Ordered: Current medicines are reviewed at length with the patient today.  Concerns regarding medicines are outlined above.  Orders Placed This Encounter  Procedures   Basic metabolic panel   CBC   LONG TERM MONITOR (3-14 DAYS)   EKG 12-Lead   ECHOCARDIOGRAM COMPLETE   No orders of the defined types were placed in this encounter.   Patient Instructions  Medication Instructions:  STOP Metoprolol Succinate (Toprol-XL) *If you need a refill on your cardiac medications before your next appointment, please call your pharmacy*  Lab Work: Your physician recommends that you return for lab work TODAY:  BMET CBC If you have labs (blood work) drawn today and your tests are completely normal, you will receive your results only by: [facility] (if you have MyChart) OR A paper copy in the mail If you have any lab test that is abnormal or we need to change your treatment, we will call you to review the results.  Testing/Procedures: Your physician has requested that you have an echocardiogram. Echocardiography is a painless test that uses sound waves  to create images of your heart. It provides your doctor with information about the size and shape of your heart and how well your hearts chambers and valves are working. This procedure takes approximately one  hour. There are no restrictions for this procedure.  ZIO XT- Long Term Monitor Instructions  Your physician has requested you wear a ZIO patch monitor for 14 days.  This is a single patch monitor. [employer] supplies one patch monitor per enrollment. Additional stickers are not available. Please do not apply patch if you will be having a Nuclear Stress Test,  Echocardiogram, Cardiac CT, MRI, or Chest Xray during the period you would be wearing the  monitor. The patch cannot be worn during these tests. You cannot remove and re-apply the  ZIO XT patch monitor.  Your ZIO patch monitor will be mailed 3 day USPS to your address on file. It may take 3-5 days  to receive your monitor after you have been enrolled.  Once you have received your monitor, please review the enclosed instructions. Your monitor  has already been registered assigning a specific monitor serial # to you.  Billing and Patient Assistance Program Information  We have supplied [employer] with any of your insurance information on file for billing purposes. [employer] offers a sliding scale Patient Assistance Program for patients that do not have  insurance, or whose insurance does not completely cover the cost of the ZIO monitor.  You must apply for the Patient Assistance Program to qualify for this discounted rate.  To apply, please call [employer] at [phone], select option 4, select option 2, ask to apply for  Patient Assistance Program. Theodore Demark will ask your household income, and how many people  are in your household. They will quote your out-of-pocket cost based on that information.  [employer] will also be able to set up a 33-month, interest-free payment plan if needed.  Applying the monitor   Shave hair from upper left  chest.  Hold abrader disc by orange tab. Rub abrader in 40 strokes over the upper left chest as  indicated in your monitor instructions.  Clean area with 4 enclosed alcohol pads. Let dry.  Apply patch as indicated in monitor instructions. Patch will be placed under collarbone on left  side of chest with arrow pointing upward.  Rub patch adhesive wings for 2 minutes. Remove white label marked "1". Remove the white  label marked "2". Rub patch adhesive wings for 2 additional minutes.  While looking in a mirror, press and release button in center of patch. A small green light will  flash 3-4 times. This will be your only indicator that the monitor has been turned on.  Do not shower for the first 24 hours. You may shower after the first 24 hours.  Press the button if you feel a symptom. You will hear a small click. Record Date, Time and  Symptom in the Patient Logbook.  When you are ready to remove the patch, follow instructions on the last 2 pages of Patient  Logbook. Stick patch monitor onto the last page of Patient Logbook.  Place Patient Logbook in the blue and white box. Use locking tab on box and tape box closed  securely. The blue and white box has prepaid postage on it. Please place it in the mailbox as  soon as possible. Your physician should have your test results approximately 7 days after the  monitor has been mailed back to [facility].  Call [facility] at [phone] if you have questions regarding  your ZIO XT patch monitor. Call them immediately if you see an orange light blinking on your  monitor.  If your monitor falls off in less than 4 days,  contact our Monitor department at [phone].  If your monitor becomes loose or falls off after 4 days call [employer] at [phone] for  suggestions on securing your monitor  Follow-Up: At [facility], you and your health needs are our priority.  As part of our continuing mission to provide you with  exceptional heart care, we have created designated Provider Care Teams.  These Care Teams include your primary Cardiologist (physician) and Advanced Practice Providers (APPs -  Physician Assistants and Nurse Practitioners) who all work together to provide you with the care you need, when you need it.  Your next appointment:   4-5 week(s)  The format for your next appointment:   In Person  Provider:   Sanda Klein, MD  or APP   Other Instructions Call 911 if you experience any passing out episode to be taken to the nearest emergency department     Signed, Emmaline Life, NP  12/13/2021 3:56 PM    [place]

## 2021-12-14 ENCOUNTER — Ambulatory Visit (INDEPENDENT_AMBULATORY_CARE_PROVIDER_SITE_OTHER): Payer: Medicare Other

## 2021-12-14 DIAGNOSIS — D509 Iron deficiency anemia, unspecified: Secondary | ICD-10-CM | POA: Diagnosis not present

## 2021-12-14 DIAGNOSIS — Z992 Dependence on renal dialysis: Secondary | ICD-10-CM | POA: Diagnosis not present

## 2021-12-14 DIAGNOSIS — R82998 Other abnormal findings in urine: Secondary | ICD-10-CM | POA: Diagnosis not present

## 2021-12-14 DIAGNOSIS — Z23 Encounter for immunization: Secondary | ICD-10-CM | POA: Diagnosis not present

## 2021-12-14 DIAGNOSIS — K769 Liver disease, unspecified: Secondary | ICD-10-CM | POA: Diagnosis not present

## 2021-12-14 DIAGNOSIS — R55 Syncope and collapse: Secondary | ICD-10-CM

## 2021-12-14 DIAGNOSIS — Z4932 Encounter for adequacy testing for peritoneal dialysis: Secondary | ICD-10-CM | POA: Diagnosis not present

## 2021-12-14 DIAGNOSIS — D631 Anemia in chronic kidney disease: Secondary | ICD-10-CM | POA: Diagnosis not present

## 2021-12-14 DIAGNOSIS — N186 End stage renal disease: Secondary | ICD-10-CM | POA: Diagnosis not present

## 2021-12-14 LAB — BASIC METABOLIC PANEL
BUN/Creatinine Ratio: 5 — ABNORMAL LOW (ref 12–28)
BUN: 51 mg/dL — ABNORMAL HIGH (ref 8–27)
CO2: 22 mmol/L (ref 20–29)
Calcium: 8.8 mg/dL (ref 8.7–10.3)
Chloride: 96 mmol/L (ref 96–106)
Creatinine, Ser: 10.41 mg/dL — ABNORMAL HIGH (ref 0.57–1.00)
Glucose: 99 mg/dL (ref 70–99)
Potassium: 4.4 mmol/L (ref 3.5–5.2)
Sodium: 145 mmol/L — ABNORMAL HIGH (ref 134–144)
eGFR: 3 mL/min/{1.73_m2} — ABNORMAL LOW (ref >59)

## 2021-12-14 LAB — ECHOCARDIOGRAM COMPLETE
AR max vel: 1.91 cm2
AV Area VTI: 1.74 cm2
AV Area mean vel: 1.99 cm2
AV Mean grad: 6.5 mmHg
AV Peak grad: 13.4 mmHg
Ao pk vel: 1.83 m/s
Area-P 1/2: 2.53 cm2
Calc EF: 61.5 %
S' Lateral: 2.68 cm
Single Plane A2C EF: 61.9 %
Single Plane A4C EF: 62.3 %

## 2021-12-14 LAB — CBC
Hematocrit: 27.1 % — ABNORMAL LOW (ref 34.0–46.6)
Hemoglobin: 9.4 g/dL — ABNORMAL LOW (ref 11.1–15.9)
MCH: 30.1 pg (ref 26.6–33.0)
MCHC: 34.7 g/dL (ref 31.5–35.7)
MCV: 87 fL (ref 79–97)
NRBC: 1 % — ABNORMAL HIGH (ref 0–0)
Platelets: 286 10*3/uL (ref 150–450)
RBC: 3.12 x10E6/uL — ABNORMAL LOW (ref 3.77–5.28)
RDW: 14.3 % (ref 11.7–15.4)
WBC: 8.4 10*3/uL (ref 3.4–10.8)

## 2021-12-15 DIAGNOSIS — K769 Liver disease, unspecified: Secondary | ICD-10-CM | POA: Diagnosis not present

## 2021-12-15 DIAGNOSIS — Z23 Encounter for immunization: Secondary | ICD-10-CM | POA: Diagnosis not present

## 2021-12-15 DIAGNOSIS — D631 Anemia in chronic kidney disease: Secondary | ICD-10-CM | POA: Diagnosis not present

## 2021-12-15 DIAGNOSIS — Z992 Dependence on renal dialysis: Secondary | ICD-10-CM | POA: Diagnosis not present

## 2021-12-15 DIAGNOSIS — R82998 Other abnormal findings in urine: Secondary | ICD-10-CM | POA: Diagnosis not present

## 2021-12-15 DIAGNOSIS — D509 Iron deficiency anemia, unspecified: Secondary | ICD-10-CM | POA: Diagnosis not present

## 2021-12-15 DIAGNOSIS — N186 End stage renal disease: Secondary | ICD-10-CM | POA: Diagnosis not present

## 2021-12-15 DIAGNOSIS — Z4932 Encounter for adequacy testing for peritoneal dialysis: Secondary | ICD-10-CM | POA: Diagnosis not present

## 2021-12-16 DIAGNOSIS — N186 End stage renal disease: Secondary | ICD-10-CM | POA: Diagnosis not present

## 2021-12-16 DIAGNOSIS — D631 Anemia in chronic kidney disease: Secondary | ICD-10-CM | POA: Diagnosis not present

## 2021-12-16 DIAGNOSIS — D509 Iron deficiency anemia, unspecified: Secondary | ICD-10-CM | POA: Diagnosis not present

## 2021-12-16 DIAGNOSIS — Z992 Dependence on renal dialysis: Secondary | ICD-10-CM | POA: Diagnosis not present

## 2021-12-16 DIAGNOSIS — R82998 Other abnormal findings in urine: Secondary | ICD-10-CM | POA: Diagnosis not present

## 2021-12-16 DIAGNOSIS — Z4932 Encounter for adequacy testing for peritoneal dialysis: Secondary | ICD-10-CM | POA: Diagnosis not present

## 2021-12-16 DIAGNOSIS — Z23 Encounter for immunization: Secondary | ICD-10-CM | POA: Diagnosis not present

## 2021-12-16 DIAGNOSIS — K769 Liver disease, unspecified: Secondary | ICD-10-CM | POA: Diagnosis not present

## 2021-12-16 NOTE — Telephone Encounter (Signed)
Patient has been seen.

## 2021-12-17 DIAGNOSIS — Z992 Dependence on renal dialysis: Secondary | ICD-10-CM | POA: Diagnosis not present

## 2021-12-17 DIAGNOSIS — D509 Iron deficiency anemia, unspecified: Secondary | ICD-10-CM | POA: Diagnosis not present

## 2021-12-17 DIAGNOSIS — K769 Liver disease, unspecified: Secondary | ICD-10-CM | POA: Diagnosis not present

## 2021-12-17 DIAGNOSIS — R82998 Other abnormal findings in urine: Secondary | ICD-10-CM | POA: Diagnosis not present

## 2021-12-17 DIAGNOSIS — Z4932 Encounter for adequacy testing for peritoneal dialysis: Secondary | ICD-10-CM | POA: Diagnosis not present

## 2021-12-17 DIAGNOSIS — Z23 Encounter for immunization: Secondary | ICD-10-CM | POA: Diagnosis not present

## 2021-12-17 DIAGNOSIS — D631 Anemia in chronic kidney disease: Secondary | ICD-10-CM | POA: Diagnosis not present

## 2021-12-17 DIAGNOSIS — N186 End stage renal disease: Secondary | ICD-10-CM | POA: Diagnosis not present

## 2021-12-18 DIAGNOSIS — D509 Iron deficiency anemia, unspecified: Secondary | ICD-10-CM | POA: Diagnosis not present

## 2021-12-18 DIAGNOSIS — Z23 Encounter for immunization: Secondary | ICD-10-CM | POA: Diagnosis not present

## 2021-12-18 DIAGNOSIS — D631 Anemia in chronic kidney disease: Secondary | ICD-10-CM | POA: Diagnosis not present

## 2021-12-18 DIAGNOSIS — K769 Liver disease, unspecified: Secondary | ICD-10-CM | POA: Diagnosis not present

## 2021-12-18 DIAGNOSIS — Z992 Dependence on renal dialysis: Secondary | ICD-10-CM | POA: Diagnosis not present

## 2021-12-18 DIAGNOSIS — R82998 Other abnormal findings in urine: Secondary | ICD-10-CM | POA: Diagnosis not present

## 2021-12-18 DIAGNOSIS — N186 End stage renal disease: Secondary | ICD-10-CM | POA: Diagnosis not present

## 2021-12-18 DIAGNOSIS — Z4932 Encounter for adequacy testing for peritoneal dialysis: Secondary | ICD-10-CM | POA: Diagnosis not present

## 2021-12-19 DIAGNOSIS — Z992 Dependence on renal dialysis: Secondary | ICD-10-CM | POA: Diagnosis not present

## 2021-12-19 DIAGNOSIS — D631 Anemia in chronic kidney disease: Secondary | ICD-10-CM | POA: Diagnosis not present

## 2021-12-19 DIAGNOSIS — R82998 Other abnormal findings in urine: Secondary | ICD-10-CM | POA: Diagnosis not present

## 2021-12-19 DIAGNOSIS — N186 End stage renal disease: Secondary | ICD-10-CM | POA: Diagnosis not present

## 2021-12-19 DIAGNOSIS — Z4932 Encounter for adequacy testing for peritoneal dialysis: Secondary | ICD-10-CM | POA: Diagnosis not present

## 2021-12-19 DIAGNOSIS — K769 Liver disease, unspecified: Secondary | ICD-10-CM | POA: Diagnosis not present

## 2021-12-19 DIAGNOSIS — D509 Iron deficiency anemia, unspecified: Secondary | ICD-10-CM | POA: Diagnosis not present

## 2021-12-19 DIAGNOSIS — Z23 Encounter for immunization: Secondary | ICD-10-CM | POA: Diagnosis not present

## 2021-12-20 DIAGNOSIS — Z23 Encounter for immunization: Secondary | ICD-10-CM | POA: Diagnosis not present

## 2021-12-20 DIAGNOSIS — R82998 Other abnormal findings in urine: Secondary | ICD-10-CM | POA: Diagnosis not present

## 2021-12-20 DIAGNOSIS — D631 Anemia in chronic kidney disease: Secondary | ICD-10-CM | POA: Diagnosis not present

## 2021-12-20 DIAGNOSIS — R002 Palpitations: Secondary | ICD-10-CM

## 2021-12-20 DIAGNOSIS — D509 Iron deficiency anemia, unspecified: Secondary | ICD-10-CM | POA: Diagnosis not present

## 2021-12-20 DIAGNOSIS — K769 Liver disease, unspecified: Secondary | ICD-10-CM | POA: Diagnosis not present

## 2021-12-20 DIAGNOSIS — Z992 Dependence on renal dialysis: Secondary | ICD-10-CM | POA: Diagnosis not present

## 2021-12-20 DIAGNOSIS — N186 End stage renal disease: Secondary | ICD-10-CM | POA: Diagnosis not present

## 2021-12-20 DIAGNOSIS — Z4932 Encounter for adequacy testing for peritoneal dialysis: Secondary | ICD-10-CM | POA: Diagnosis not present

## 2021-12-21 DIAGNOSIS — Z23 Encounter for immunization: Secondary | ICD-10-CM | POA: Diagnosis not present

## 2021-12-21 DIAGNOSIS — K769 Liver disease, unspecified: Secondary | ICD-10-CM | POA: Diagnosis not present

## 2021-12-21 DIAGNOSIS — R82998 Other abnormal findings in urine: Secondary | ICD-10-CM | POA: Diagnosis not present

## 2021-12-21 DIAGNOSIS — N186 End stage renal disease: Secondary | ICD-10-CM | POA: Diagnosis not present

## 2021-12-21 DIAGNOSIS — Z992 Dependence on renal dialysis: Secondary | ICD-10-CM | POA: Diagnosis not present

## 2021-12-21 DIAGNOSIS — D509 Iron deficiency anemia, unspecified: Secondary | ICD-10-CM | POA: Diagnosis not present

## 2021-12-21 DIAGNOSIS — D631 Anemia in chronic kidney disease: Secondary | ICD-10-CM | POA: Diagnosis not present

## 2021-12-21 DIAGNOSIS — Z4932 Encounter for adequacy testing for peritoneal dialysis: Secondary | ICD-10-CM | POA: Diagnosis not present

## 2021-12-22 DIAGNOSIS — Z992 Dependence on renal dialysis: Secondary | ICD-10-CM | POA: Diagnosis not present

## 2021-12-22 DIAGNOSIS — Z23 Encounter for immunization: Secondary | ICD-10-CM | POA: Diagnosis not present

## 2021-12-22 DIAGNOSIS — R82998 Other abnormal findings in urine: Secondary | ICD-10-CM | POA: Diagnosis not present

## 2021-12-22 DIAGNOSIS — D631 Anemia in chronic kidney disease: Secondary | ICD-10-CM | POA: Diagnosis not present

## 2021-12-22 DIAGNOSIS — D509 Iron deficiency anemia, unspecified: Secondary | ICD-10-CM | POA: Diagnosis not present

## 2021-12-22 DIAGNOSIS — N186 End stage renal disease: Secondary | ICD-10-CM | POA: Diagnosis not present

## 2021-12-22 DIAGNOSIS — Z4932 Encounter for adequacy testing for peritoneal dialysis: Secondary | ICD-10-CM | POA: Diagnosis not present

## 2021-12-22 DIAGNOSIS — K769 Liver disease, unspecified: Secondary | ICD-10-CM | POA: Diagnosis not present

## 2021-12-23 DIAGNOSIS — Z4932 Encounter for adequacy testing for peritoneal dialysis: Secondary | ICD-10-CM | POA: Diagnosis not present

## 2021-12-23 DIAGNOSIS — Z23 Encounter for immunization: Secondary | ICD-10-CM | POA: Diagnosis not present

## 2021-12-23 DIAGNOSIS — K769 Liver disease, unspecified: Secondary | ICD-10-CM | POA: Diagnosis not present

## 2021-12-23 DIAGNOSIS — D509 Iron deficiency anemia, unspecified: Secondary | ICD-10-CM | POA: Diagnosis not present

## 2021-12-23 DIAGNOSIS — Z992 Dependence on renal dialysis: Secondary | ICD-10-CM | POA: Diagnosis not present

## 2021-12-23 DIAGNOSIS — D631 Anemia in chronic kidney disease: Secondary | ICD-10-CM | POA: Diagnosis not present

## 2021-12-23 DIAGNOSIS — N186 End stage renal disease: Secondary | ICD-10-CM | POA: Diagnosis not present

## 2021-12-23 DIAGNOSIS — R82998 Other abnormal findings in urine: Secondary | ICD-10-CM | POA: Diagnosis not present

## 2021-12-24 DIAGNOSIS — D509 Iron deficiency anemia, unspecified: Secondary | ICD-10-CM | POA: Diagnosis not present

## 2021-12-24 DIAGNOSIS — N186 End stage renal disease: Secondary | ICD-10-CM | POA: Diagnosis not present

## 2021-12-24 DIAGNOSIS — Z23 Encounter for immunization: Secondary | ICD-10-CM | POA: Diagnosis not present

## 2021-12-24 DIAGNOSIS — D631 Anemia in chronic kidney disease: Secondary | ICD-10-CM | POA: Diagnosis not present

## 2021-12-24 DIAGNOSIS — Z992 Dependence on renal dialysis: Secondary | ICD-10-CM | POA: Diagnosis not present

## 2021-12-24 DIAGNOSIS — K769 Liver disease, unspecified: Secondary | ICD-10-CM | POA: Diagnosis not present

## 2021-12-24 DIAGNOSIS — R82998 Other abnormal findings in urine: Secondary | ICD-10-CM | POA: Diagnosis not present

## 2021-12-24 DIAGNOSIS — I129 Hypertensive chronic kidney disease with stage 1 through stage 4 chronic kidney disease, or unspecified chronic kidney disease: Secondary | ICD-10-CM | POA: Diagnosis not present

## 2021-12-24 DIAGNOSIS — Z4932 Encounter for adequacy testing for peritoneal dialysis: Secondary | ICD-10-CM | POA: Diagnosis not present

## 2021-12-25 DIAGNOSIS — N186 End stage renal disease: Secondary | ICD-10-CM | POA: Diagnosis not present

## 2021-12-25 DIAGNOSIS — R82998 Other abnormal findings in urine: Secondary | ICD-10-CM | POA: Diagnosis not present

## 2021-12-25 DIAGNOSIS — Z4932 Encounter for adequacy testing for peritoneal dialysis: Secondary | ICD-10-CM | POA: Diagnosis not present

## 2021-12-25 DIAGNOSIS — E1122 Type 2 diabetes mellitus with diabetic chronic kidney disease: Secondary | ICD-10-CM | POA: Diagnosis not present

## 2021-12-25 DIAGNOSIS — Z992 Dependence on renal dialysis: Secondary | ICD-10-CM | POA: Diagnosis not present

## 2021-12-25 DIAGNOSIS — D631 Anemia in chronic kidney disease: Secondary | ICD-10-CM | POA: Diagnosis not present

## 2021-12-25 DIAGNOSIS — D509 Iron deficiency anemia, unspecified: Secondary | ICD-10-CM | POA: Diagnosis not present

## 2021-12-26 DIAGNOSIS — Z992 Dependence on renal dialysis: Secondary | ICD-10-CM | POA: Diagnosis not present

## 2021-12-26 DIAGNOSIS — D509 Iron deficiency anemia, unspecified: Secondary | ICD-10-CM | POA: Diagnosis not present

## 2021-12-26 DIAGNOSIS — N186 End stage renal disease: Secondary | ICD-10-CM | POA: Diagnosis not present

## 2021-12-26 DIAGNOSIS — D631 Anemia in chronic kidney disease: Secondary | ICD-10-CM | POA: Diagnosis not present

## 2021-12-26 DIAGNOSIS — Z4932 Encounter for adequacy testing for peritoneal dialysis: Secondary | ICD-10-CM | POA: Diagnosis not present

## 2021-12-26 DIAGNOSIS — R82998 Other abnormal findings in urine: Secondary | ICD-10-CM | POA: Diagnosis not present

## 2021-12-26 DIAGNOSIS — E1122 Type 2 diabetes mellitus with diabetic chronic kidney disease: Secondary | ICD-10-CM | POA: Diagnosis not present

## 2021-12-27 DIAGNOSIS — E1122 Type 2 diabetes mellitus with diabetic chronic kidney disease: Secondary | ICD-10-CM | POA: Diagnosis not present

## 2021-12-27 DIAGNOSIS — Z992 Dependence on renal dialysis: Secondary | ICD-10-CM | POA: Diagnosis not present

## 2021-12-27 DIAGNOSIS — Z4932 Encounter for adequacy testing for peritoneal dialysis: Secondary | ICD-10-CM | POA: Diagnosis not present

## 2021-12-27 DIAGNOSIS — R82998 Other abnormal findings in urine: Secondary | ICD-10-CM | POA: Diagnosis not present

## 2021-12-27 DIAGNOSIS — N186 End stage renal disease: Secondary | ICD-10-CM | POA: Diagnosis not present

## 2021-12-27 DIAGNOSIS — D509 Iron deficiency anemia, unspecified: Secondary | ICD-10-CM | POA: Diagnosis not present

## 2021-12-27 DIAGNOSIS — D631 Anemia in chronic kidney disease: Secondary | ICD-10-CM | POA: Diagnosis not present

## 2021-12-28 DIAGNOSIS — Z992 Dependence on renal dialysis: Secondary | ICD-10-CM | POA: Diagnosis not present

## 2021-12-28 DIAGNOSIS — Z4932 Encounter for adequacy testing for peritoneal dialysis: Secondary | ICD-10-CM | POA: Diagnosis not present

## 2021-12-28 DIAGNOSIS — R82998 Other abnormal findings in urine: Secondary | ICD-10-CM | POA: Diagnosis not present

## 2021-12-28 DIAGNOSIS — D509 Iron deficiency anemia, unspecified: Secondary | ICD-10-CM | POA: Diagnosis not present

## 2021-12-28 DIAGNOSIS — N186 End stage renal disease: Secondary | ICD-10-CM | POA: Diagnosis not present

## 2021-12-28 DIAGNOSIS — E1122 Type 2 diabetes mellitus with diabetic chronic kidney disease: Secondary | ICD-10-CM | POA: Diagnosis not present

## 2021-12-28 DIAGNOSIS — D631 Anemia in chronic kidney disease: Secondary | ICD-10-CM | POA: Diagnosis not present

## 2021-12-29 DIAGNOSIS — R82998 Other abnormal findings in urine: Secondary | ICD-10-CM | POA: Diagnosis not present

## 2021-12-29 DIAGNOSIS — I129 Hypertensive chronic kidney disease with stage 1 through stage 4 chronic kidney disease, or unspecified chronic kidney disease: Secondary | ICD-10-CM | POA: Diagnosis not present

## 2021-12-29 DIAGNOSIS — N186 End stage renal disease: Secondary | ICD-10-CM | POA: Diagnosis not present

## 2021-12-29 DIAGNOSIS — G4700 Insomnia, unspecified: Secondary | ICD-10-CM | POA: Diagnosis not present

## 2021-12-29 DIAGNOSIS — Z992 Dependence on renal dialysis: Secondary | ICD-10-CM | POA: Diagnosis not present

## 2021-12-29 DIAGNOSIS — D631 Anemia in chronic kidney disease: Secondary | ICD-10-CM | POA: Diagnosis not present

## 2021-12-29 DIAGNOSIS — E441 Mild protein-calorie malnutrition: Secondary | ICD-10-CM | POA: Diagnosis not present

## 2021-12-29 DIAGNOSIS — Z4932 Encounter for adequacy testing for peritoneal dialysis: Secondary | ICD-10-CM | POA: Diagnosis not present

## 2021-12-29 DIAGNOSIS — E0590 Thyrotoxicosis, unspecified without thyrotoxic crisis or storm: Secondary | ICD-10-CM | POA: Diagnosis not present

## 2021-12-29 DIAGNOSIS — E1142 Type 2 diabetes mellitus with diabetic polyneuropathy: Secondary | ICD-10-CM | POA: Diagnosis not present

## 2021-12-29 DIAGNOSIS — D509 Iron deficiency anemia, unspecified: Secondary | ICD-10-CM | POA: Diagnosis not present

## 2021-12-29 DIAGNOSIS — E1122 Type 2 diabetes mellitus with diabetic chronic kidney disease: Secondary | ICD-10-CM | POA: Diagnosis not present

## 2021-12-30 DIAGNOSIS — E1122 Type 2 diabetes mellitus with diabetic chronic kidney disease: Secondary | ICD-10-CM | POA: Diagnosis not present

## 2021-12-30 DIAGNOSIS — N186 End stage renal disease: Secondary | ICD-10-CM | POA: Diagnosis not present

## 2021-12-30 DIAGNOSIS — Z992 Dependence on renal dialysis: Secondary | ICD-10-CM | POA: Diagnosis not present

## 2021-12-30 DIAGNOSIS — R82998 Other abnormal findings in urine: Secondary | ICD-10-CM | POA: Diagnosis not present

## 2021-12-30 DIAGNOSIS — Z4932 Encounter for adequacy testing for peritoneal dialysis: Secondary | ICD-10-CM | POA: Diagnosis not present

## 2021-12-30 DIAGNOSIS — D631 Anemia in chronic kidney disease: Secondary | ICD-10-CM | POA: Diagnosis not present

## 2021-12-30 DIAGNOSIS — D509 Iron deficiency anemia, unspecified: Secondary | ICD-10-CM | POA: Diagnosis not present

## 2021-12-31 DIAGNOSIS — Z4932 Encounter for adequacy testing for peritoneal dialysis: Secondary | ICD-10-CM | POA: Diagnosis not present

## 2021-12-31 DIAGNOSIS — Z992 Dependence on renal dialysis: Secondary | ICD-10-CM | POA: Diagnosis not present

## 2021-12-31 DIAGNOSIS — R82998 Other abnormal findings in urine: Secondary | ICD-10-CM | POA: Diagnosis not present

## 2021-12-31 DIAGNOSIS — E1122 Type 2 diabetes mellitus with diabetic chronic kidney disease: Secondary | ICD-10-CM | POA: Diagnosis not present

## 2021-12-31 DIAGNOSIS — D631 Anemia in chronic kidney disease: Secondary | ICD-10-CM | POA: Diagnosis not present

## 2021-12-31 DIAGNOSIS — N186 End stage renal disease: Secondary | ICD-10-CM | POA: Diagnosis not present

## 2021-12-31 DIAGNOSIS — D509 Iron deficiency anemia, unspecified: Secondary | ICD-10-CM | POA: Diagnosis not present

## 2022-01-01 DIAGNOSIS — R82998 Other abnormal findings in urine: Secondary | ICD-10-CM | POA: Diagnosis not present

## 2022-01-01 DIAGNOSIS — D509 Iron deficiency anemia, unspecified: Secondary | ICD-10-CM | POA: Diagnosis not present

## 2022-01-01 DIAGNOSIS — Z4932 Encounter for adequacy testing for peritoneal dialysis: Secondary | ICD-10-CM | POA: Diagnosis not present

## 2022-01-01 DIAGNOSIS — Z992 Dependence on renal dialysis: Secondary | ICD-10-CM | POA: Diagnosis not present

## 2022-01-01 DIAGNOSIS — N186 End stage renal disease: Secondary | ICD-10-CM | POA: Diagnosis not present

## 2022-01-01 DIAGNOSIS — D631 Anemia in chronic kidney disease: Secondary | ICD-10-CM | POA: Diagnosis not present

## 2022-01-01 DIAGNOSIS — E1122 Type 2 diabetes mellitus with diabetic chronic kidney disease: Secondary | ICD-10-CM | POA: Diagnosis not present

## 2022-01-02 DIAGNOSIS — R82998 Other abnormal findings in urine: Secondary | ICD-10-CM | POA: Diagnosis not present

## 2022-01-02 DIAGNOSIS — N186 End stage renal disease: Secondary | ICD-10-CM | POA: Diagnosis not present

## 2022-01-02 DIAGNOSIS — Z992 Dependence on renal dialysis: Secondary | ICD-10-CM | POA: Diagnosis not present

## 2022-01-02 DIAGNOSIS — D509 Iron deficiency anemia, unspecified: Secondary | ICD-10-CM | POA: Diagnosis not present

## 2022-01-02 DIAGNOSIS — E1122 Type 2 diabetes mellitus with diabetic chronic kidney disease: Secondary | ICD-10-CM | POA: Diagnosis not present

## 2022-01-02 DIAGNOSIS — D631 Anemia in chronic kidney disease: Secondary | ICD-10-CM | POA: Diagnosis not present

## 2022-01-02 DIAGNOSIS — Z4932 Encounter for adequacy testing for peritoneal dialysis: Secondary | ICD-10-CM | POA: Diagnosis not present

## 2022-01-03 DIAGNOSIS — D509 Iron deficiency anemia, unspecified: Secondary | ICD-10-CM | POA: Diagnosis not present

## 2022-01-03 DIAGNOSIS — N186 End stage renal disease: Secondary | ICD-10-CM | POA: Diagnosis not present

## 2022-01-03 DIAGNOSIS — Z992 Dependence on renal dialysis: Secondary | ICD-10-CM | POA: Diagnosis not present

## 2022-01-03 DIAGNOSIS — Z4932 Encounter for adequacy testing for peritoneal dialysis: Secondary | ICD-10-CM | POA: Diagnosis not present

## 2022-01-03 DIAGNOSIS — R82998 Other abnormal findings in urine: Secondary | ICD-10-CM | POA: Diagnosis not present

## 2022-01-03 DIAGNOSIS — E1122 Type 2 diabetes mellitus with diabetic chronic kidney disease: Secondary | ICD-10-CM | POA: Diagnosis not present

## 2022-01-03 DIAGNOSIS — D631 Anemia in chronic kidney disease: Secondary | ICD-10-CM | POA: Diagnosis not present

## 2022-01-04 DIAGNOSIS — R82998 Other abnormal findings in urine: Secondary | ICD-10-CM | POA: Diagnosis not present

## 2022-01-04 DIAGNOSIS — Z992 Dependence on renal dialysis: Secondary | ICD-10-CM | POA: Diagnosis not present

## 2022-01-04 DIAGNOSIS — D631 Anemia in chronic kidney disease: Secondary | ICD-10-CM | POA: Diagnosis not present

## 2022-01-04 DIAGNOSIS — Z4932 Encounter for adequacy testing for peritoneal dialysis: Secondary | ICD-10-CM | POA: Diagnosis not present

## 2022-01-04 DIAGNOSIS — E1122 Type 2 diabetes mellitus with diabetic chronic kidney disease: Secondary | ICD-10-CM | POA: Diagnosis not present

## 2022-01-04 DIAGNOSIS — D509 Iron deficiency anemia, unspecified: Secondary | ICD-10-CM | POA: Diagnosis not present

## 2022-01-04 DIAGNOSIS — N186 End stage renal disease: Secondary | ICD-10-CM | POA: Diagnosis not present

## 2022-01-05 DIAGNOSIS — N186 End stage renal disease: Secondary | ICD-10-CM | POA: Diagnosis not present

## 2022-01-05 DIAGNOSIS — E1122 Type 2 diabetes mellitus with diabetic chronic kidney disease: Secondary | ICD-10-CM | POA: Diagnosis not present

## 2022-01-05 DIAGNOSIS — D631 Anemia in chronic kidney disease: Secondary | ICD-10-CM | POA: Diagnosis not present

## 2022-01-05 DIAGNOSIS — Z992 Dependence on renal dialysis: Secondary | ICD-10-CM | POA: Diagnosis not present

## 2022-01-05 DIAGNOSIS — Z4932 Encounter for adequacy testing for peritoneal dialysis: Secondary | ICD-10-CM | POA: Diagnosis not present

## 2022-01-05 DIAGNOSIS — R82998 Other abnormal findings in urine: Secondary | ICD-10-CM | POA: Diagnosis not present

## 2022-01-05 DIAGNOSIS — D509 Iron deficiency anemia, unspecified: Secondary | ICD-10-CM | POA: Diagnosis not present

## 2022-01-06 DIAGNOSIS — Z992 Dependence on renal dialysis: Secondary | ICD-10-CM | POA: Diagnosis not present

## 2022-01-06 DIAGNOSIS — E1122 Type 2 diabetes mellitus with diabetic chronic kidney disease: Secondary | ICD-10-CM | POA: Diagnosis not present

## 2022-01-06 DIAGNOSIS — D631 Anemia in chronic kidney disease: Secondary | ICD-10-CM | POA: Diagnosis not present

## 2022-01-06 DIAGNOSIS — D509 Iron deficiency anemia, unspecified: Secondary | ICD-10-CM | POA: Diagnosis not present

## 2022-01-06 DIAGNOSIS — R82998 Other abnormal findings in urine: Secondary | ICD-10-CM | POA: Diagnosis not present

## 2022-01-06 DIAGNOSIS — R002 Palpitations: Secondary | ICD-10-CM | POA: Diagnosis not present

## 2022-01-06 DIAGNOSIS — N186 End stage renal disease: Secondary | ICD-10-CM | POA: Diagnosis not present

## 2022-01-06 DIAGNOSIS — Z4932 Encounter for adequacy testing for peritoneal dialysis: Secondary | ICD-10-CM | POA: Diagnosis not present

## 2022-01-07 DIAGNOSIS — N186 End stage renal disease: Secondary | ICD-10-CM | POA: Diagnosis not present

## 2022-01-07 DIAGNOSIS — Z4932 Encounter for adequacy testing for peritoneal dialysis: Secondary | ICD-10-CM | POA: Diagnosis not present

## 2022-01-07 DIAGNOSIS — Z992 Dependence on renal dialysis: Secondary | ICD-10-CM | POA: Diagnosis not present

## 2022-01-07 DIAGNOSIS — D509 Iron deficiency anemia, unspecified: Secondary | ICD-10-CM | POA: Diagnosis not present

## 2022-01-07 DIAGNOSIS — E1122 Type 2 diabetes mellitus with diabetic chronic kidney disease: Secondary | ICD-10-CM | POA: Diagnosis not present

## 2022-01-07 DIAGNOSIS — R82998 Other abnormal findings in urine: Secondary | ICD-10-CM | POA: Diagnosis not present

## 2022-01-07 DIAGNOSIS — D631 Anemia in chronic kidney disease: Secondary | ICD-10-CM | POA: Diagnosis not present

## 2022-01-08 DIAGNOSIS — D631 Anemia in chronic kidney disease: Secondary | ICD-10-CM | POA: Diagnosis not present

## 2022-01-08 DIAGNOSIS — R82998 Other abnormal findings in urine: Secondary | ICD-10-CM | POA: Diagnosis not present

## 2022-01-08 DIAGNOSIS — E1122 Type 2 diabetes mellitus with diabetic chronic kidney disease: Secondary | ICD-10-CM | POA: Diagnosis not present

## 2022-01-08 DIAGNOSIS — Z4932 Encounter for adequacy testing for peritoneal dialysis: Secondary | ICD-10-CM | POA: Diagnosis not present

## 2022-01-08 DIAGNOSIS — N186 End stage renal disease: Secondary | ICD-10-CM | POA: Diagnosis not present

## 2022-01-08 DIAGNOSIS — D509 Iron deficiency anemia, unspecified: Secondary | ICD-10-CM | POA: Diagnosis not present

## 2022-01-08 DIAGNOSIS — Z992 Dependence on renal dialysis: Secondary | ICD-10-CM | POA: Diagnosis not present

## 2022-01-09 DIAGNOSIS — N186 End stage renal disease: Secondary | ICD-10-CM | POA: Diagnosis not present

## 2022-01-09 DIAGNOSIS — E1122 Type 2 diabetes mellitus with diabetic chronic kidney disease: Secondary | ICD-10-CM | POA: Diagnosis not present

## 2022-01-09 DIAGNOSIS — D509 Iron deficiency anemia, unspecified: Secondary | ICD-10-CM | POA: Diagnosis not present

## 2022-01-09 DIAGNOSIS — D631 Anemia in chronic kidney disease: Secondary | ICD-10-CM | POA: Diagnosis not present

## 2022-01-09 DIAGNOSIS — R82998 Other abnormal findings in urine: Secondary | ICD-10-CM | POA: Diagnosis not present

## 2022-01-09 DIAGNOSIS — Z4932 Encounter for adequacy testing for peritoneal dialysis: Secondary | ICD-10-CM | POA: Diagnosis not present

## 2022-01-09 DIAGNOSIS — Z992 Dependence on renal dialysis: Secondary | ICD-10-CM | POA: Diagnosis not present

## 2022-01-10 DIAGNOSIS — N186 End stage renal disease: Secondary | ICD-10-CM | POA: Diagnosis not present

## 2022-01-10 DIAGNOSIS — D631 Anemia in chronic kidney disease: Secondary | ICD-10-CM | POA: Diagnosis not present

## 2022-01-10 DIAGNOSIS — Z4932 Encounter for adequacy testing for peritoneal dialysis: Secondary | ICD-10-CM | POA: Diagnosis not present

## 2022-01-10 DIAGNOSIS — D509 Iron deficiency anemia, unspecified: Secondary | ICD-10-CM | POA: Diagnosis not present

## 2022-01-10 DIAGNOSIS — R82998 Other abnormal findings in urine: Secondary | ICD-10-CM | POA: Diagnosis not present

## 2022-01-10 DIAGNOSIS — E1122 Type 2 diabetes mellitus with diabetic chronic kidney disease: Secondary | ICD-10-CM | POA: Diagnosis not present

## 2022-01-10 DIAGNOSIS — Z992 Dependence on renal dialysis: Secondary | ICD-10-CM | POA: Diagnosis not present

## 2022-01-11 DIAGNOSIS — Z992 Dependence on renal dialysis: Secondary | ICD-10-CM | POA: Diagnosis not present

## 2022-01-11 DIAGNOSIS — D631 Anemia in chronic kidney disease: Secondary | ICD-10-CM | POA: Diagnosis not present

## 2022-01-11 DIAGNOSIS — D509 Iron deficiency anemia, unspecified: Secondary | ICD-10-CM | POA: Diagnosis not present

## 2022-01-11 DIAGNOSIS — R29898 Other symptoms and signs involving the musculoskeletal system: Secondary | ICD-10-CM | POA: Diagnosis not present

## 2022-01-11 DIAGNOSIS — Z4932 Encounter for adequacy testing for peritoneal dialysis: Secondary | ICD-10-CM | POA: Diagnosis not present

## 2022-01-11 DIAGNOSIS — R82998 Other abnormal findings in urine: Secondary | ICD-10-CM | POA: Diagnosis not present

## 2022-01-11 DIAGNOSIS — E1122 Type 2 diabetes mellitus with diabetic chronic kidney disease: Secondary | ICD-10-CM | POA: Diagnosis not present

## 2022-01-11 DIAGNOSIS — N186 End stage renal disease: Secondary | ICD-10-CM | POA: Diagnosis not present

## 2022-01-11 DIAGNOSIS — M79641 Pain in right hand: Secondary | ICD-10-CM | POA: Diagnosis not present

## 2022-01-12 DIAGNOSIS — Z4932 Encounter for adequacy testing for peritoneal dialysis: Secondary | ICD-10-CM | POA: Diagnosis not present

## 2022-01-12 DIAGNOSIS — D631 Anemia in chronic kidney disease: Secondary | ICD-10-CM | POA: Diagnosis not present

## 2022-01-12 DIAGNOSIS — Z992 Dependence on renal dialysis: Secondary | ICD-10-CM | POA: Diagnosis not present

## 2022-01-12 DIAGNOSIS — D509 Iron deficiency anemia, unspecified: Secondary | ICD-10-CM | POA: Diagnosis not present

## 2022-01-12 DIAGNOSIS — N186 End stage renal disease: Secondary | ICD-10-CM | POA: Diagnosis not present

## 2022-01-12 DIAGNOSIS — R82998 Other abnormal findings in urine: Secondary | ICD-10-CM | POA: Diagnosis not present

## 2022-01-12 DIAGNOSIS — E1122 Type 2 diabetes mellitus with diabetic chronic kidney disease: Secondary | ICD-10-CM | POA: Diagnosis not present

## 2022-01-13 DIAGNOSIS — D509 Iron deficiency anemia, unspecified: Secondary | ICD-10-CM | POA: Diagnosis not present

## 2022-01-13 DIAGNOSIS — Z4932 Encounter for adequacy testing for peritoneal dialysis: Secondary | ICD-10-CM | POA: Diagnosis not present

## 2022-01-13 DIAGNOSIS — N186 End stage renal disease: Secondary | ICD-10-CM | POA: Diagnosis not present

## 2022-01-13 DIAGNOSIS — Z992 Dependence on renal dialysis: Secondary | ICD-10-CM | POA: Diagnosis not present

## 2022-01-13 DIAGNOSIS — D631 Anemia in chronic kidney disease: Secondary | ICD-10-CM | POA: Diagnosis not present

## 2022-01-13 DIAGNOSIS — R29898 Other symptoms and signs involving the musculoskeletal system: Secondary | ICD-10-CM | POA: Diagnosis not present

## 2022-01-13 DIAGNOSIS — E1122 Type 2 diabetes mellitus with diabetic chronic kidney disease: Secondary | ICD-10-CM | POA: Diagnosis not present

## 2022-01-13 DIAGNOSIS — R82998 Other abnormal findings in urine: Secondary | ICD-10-CM | POA: Diagnosis not present

## 2022-01-16 DIAGNOSIS — Z992 Dependence on renal dialysis: Secondary | ICD-10-CM | POA: Diagnosis not present

## 2022-01-16 DIAGNOSIS — N186 End stage renal disease: Secondary | ICD-10-CM | POA: Diagnosis not present

## 2022-01-17 DIAGNOSIS — M6250 Muscle wasting and atrophy, not elsewhere classified, unspecified site: Secondary | ICD-10-CM | POA: Diagnosis not present

## 2022-01-18 ENCOUNTER — Encounter: Payer: Self-pay | Admitting: Internal Medicine

## 2022-01-18 DIAGNOSIS — N186 End stage renal disease: Secondary | ICD-10-CM | POA: Diagnosis not present

## 2022-01-18 DIAGNOSIS — Z992 Dependence on renal dialysis: Secondary | ICD-10-CM | POA: Diagnosis not present

## 2022-01-19 ENCOUNTER — Ambulatory Visit: Payer: Medicare Other | Admitting: Physician Assistant

## 2022-01-19 DIAGNOSIS — I10 Essential (primary) hypertension: Secondary | ICD-10-CM | POA: Diagnosis not present

## 2022-01-19 DIAGNOSIS — M79641 Pain in right hand: Secondary | ICD-10-CM | POA: Diagnosis not present

## 2022-01-19 DIAGNOSIS — N186 End stage renal disease: Secondary | ICD-10-CM | POA: Diagnosis not present

## 2022-01-20 DIAGNOSIS — N186 End stage renal disease: Secondary | ICD-10-CM | POA: Diagnosis not present

## 2022-01-20 DIAGNOSIS — Z992 Dependence on renal dialysis: Secondary | ICD-10-CM | POA: Diagnosis not present

## 2022-01-21 DIAGNOSIS — Z992 Dependence on renal dialysis: Secondary | ICD-10-CM

## 2022-01-21 DIAGNOSIS — N186 End stage renal disease: Secondary | ICD-10-CM

## 2022-01-23 DIAGNOSIS — Z992 Dependence on renal dialysis: Secondary | ICD-10-CM | POA: Diagnosis not present

## 2022-01-23 DIAGNOSIS — N186 End stage renal disease: Secondary | ICD-10-CM | POA: Diagnosis not present

## 2022-01-24 ENCOUNTER — Other Ambulatory Visit: Payer: Self-pay

## 2022-01-24 ENCOUNTER — Emergency Department (HOSPITAL_COMMUNITY): Payer: Medicare Other

## 2022-01-24 ENCOUNTER — Encounter (HOSPITAL_COMMUNITY): Payer: Self-pay | Admitting: Emergency Medicine

## 2022-01-24 ENCOUNTER — Inpatient Hospital Stay (HOSPITAL_COMMUNITY)
Admission: EM | Admit: 2022-01-24 | Discharge: 2022-01-26 | DRG: 040 | Disposition: A | Discharge status: Home / Self Care | Payer: Medicare Other | Attending: Family Medicine | Admitting: Family Medicine

## 2022-01-24 DIAGNOSIS — E0590 Thyrotoxicosis, unspecified without thyrotoxic crisis or storm: Secondary | ICD-10-CM | POA: Diagnosis present

## 2022-01-24 DIAGNOSIS — K219 Gastro-esophageal reflux disease without esophagitis: Secondary | ICD-10-CM | POA: Diagnosis not present

## 2022-01-24 DIAGNOSIS — Z7984 Long term (current) use of oral hypoglycemic drugs: Secondary | ICD-10-CM | POA: Diagnosis not present

## 2022-01-24 DIAGNOSIS — Z79899 Other long term (current) drug therapy: Secondary | ICD-10-CM | POA: Diagnosis not present

## 2022-01-24 DIAGNOSIS — E1129 Type 2 diabetes mellitus with other diabetic kidney complication: Secondary | ICD-10-CM | POA: Diagnosis present

## 2022-01-24 DIAGNOSIS — I120 Hypertensive chronic kidney disease with stage 5 chronic kidney disease or end stage renal disease: Secondary | ICD-10-CM | POA: Diagnosis present

## 2022-01-24 DIAGNOSIS — R2990 Unspecified symptoms and signs involving the nervous system: Secondary | ICD-10-CM | POA: Diagnosis not present

## 2022-01-24 DIAGNOSIS — G2581 Restless legs syndrome: Secondary | ICD-10-CM | POA: Diagnosis present

## 2022-01-24 DIAGNOSIS — I680 Cerebral amyloid angiopathy: Secondary | ICD-10-CM | POA: Diagnosis present

## 2022-01-24 DIAGNOSIS — E785 Hyperlipidemia, unspecified: Secondary | ICD-10-CM | POA: Diagnosis present

## 2022-01-24 DIAGNOSIS — M47812 Spondylosis without myelopathy or radiculopathy, cervical region: Secondary | ICD-10-CM | POA: Diagnosis not present

## 2022-01-24 DIAGNOSIS — E1142 Type 2 diabetes mellitus with diabetic polyneuropathy: Secondary | ICD-10-CM | POA: Diagnosis present

## 2022-01-24 DIAGNOSIS — M4802 Spinal stenosis, cervical region: Secondary | ICD-10-CM | POA: Diagnosis not present

## 2022-01-24 DIAGNOSIS — E039 Hypothyroidism, unspecified: Secondary | ICD-10-CM | POA: Diagnosis not present

## 2022-01-24 DIAGNOSIS — I10 Essential (primary) hypertension: Secondary | ICD-10-CM

## 2022-01-24 DIAGNOSIS — N186 End stage renal disease: Secondary | ICD-10-CM | POA: Diagnosis present

## 2022-01-24 DIAGNOSIS — Z91030 Bee allergy status: Secondary | ICD-10-CM | POA: Diagnosis not present

## 2022-01-24 DIAGNOSIS — R482 Apraxia: Secondary | ICD-10-CM | POA: Diagnosis not present

## 2022-01-24 DIAGNOSIS — F17210 Nicotine dependence, cigarettes, uncomplicated: Secondary | ICD-10-CM | POA: Diagnosis not present

## 2022-01-24 DIAGNOSIS — Z20822 Contact with and (suspected) exposure to covid-19: Secondary | ICD-10-CM | POA: Diagnosis not present

## 2022-01-24 DIAGNOSIS — I6523 Occlusion and stenosis of bilateral carotid arteries: Secondary | ICD-10-CM | POA: Diagnosis not present

## 2022-01-24 DIAGNOSIS — M898X9 Other specified disorders of bone, unspecified site: Secondary | ICD-10-CM | POA: Diagnosis not present

## 2022-01-24 DIAGNOSIS — I6612 Occlusion and stenosis of left anterior cerebral artery: Secondary | ICD-10-CM | POA: Diagnosis not present

## 2022-01-24 DIAGNOSIS — I639 Cerebral infarction, unspecified: Secondary | ICD-10-CM | POA: Diagnosis not present

## 2022-01-24 DIAGNOSIS — E1122 Type 2 diabetes mellitus with diabetic chronic kidney disease: Secondary | ICD-10-CM | POA: Diagnosis present

## 2022-01-24 DIAGNOSIS — F419 Anxiety disorder, unspecified: Secondary | ICD-10-CM | POA: Diagnosis present

## 2022-01-24 DIAGNOSIS — Z8673 Personal history of transient ischemic attack (TIA), and cerebral infarction without residual deficits: Secondary | ICD-10-CM | POA: Diagnosis not present

## 2022-01-24 DIAGNOSIS — I63413 Cerebral infarction due to embolism of bilateral middle cerebral arteries: Principal | ICD-10-CM | POA: Diagnosis present

## 2022-01-24 DIAGNOSIS — F32A Depression, unspecified: Secondary | ICD-10-CM | POA: Diagnosis not present

## 2022-01-24 DIAGNOSIS — I6389 Other cerebral infarction: Secondary | ICD-10-CM | POA: Diagnosis not present

## 2022-01-24 DIAGNOSIS — G8194 Hemiplegia, unspecified affecting left nondominant side: Secondary | ICD-10-CM | POA: Diagnosis not present

## 2022-01-24 DIAGNOSIS — D631 Anemia in chronic kidney disease: Secondary | ICD-10-CM | POA: Diagnosis present

## 2022-01-24 DIAGNOSIS — Z992 Dependence on renal dialysis: Secondary | ICD-10-CM | POA: Diagnosis not present

## 2022-01-24 DIAGNOSIS — I6623 Occlusion and stenosis of bilateral posterior cerebral arteries: Secondary | ICD-10-CM | POA: Diagnosis not present

## 2022-01-24 DIAGNOSIS — R531 Weakness: Secondary | ICD-10-CM | POA: Diagnosis not present

## 2022-01-24 DIAGNOSIS — N250 Renal osteodystrophy: Secondary | ICD-10-CM | POA: Diagnosis not present

## 2022-01-24 DIAGNOSIS — R29701 NIHSS score 1: Secondary | ICD-10-CM | POA: Diagnosis not present

## 2022-01-24 DIAGNOSIS — N189 Chronic kidney disease, unspecified: Secondary | ICD-10-CM | POA: Diagnosis present

## 2022-01-24 DIAGNOSIS — I129 Hypertensive chronic kidney disease with stage 1 through stage 4 chronic kidney disease, or unspecified chronic kidney disease: Secondary | ICD-10-CM | POA: Diagnosis not present

## 2022-01-24 DIAGNOSIS — M2578 Osteophyte, vertebrae: Secondary | ICD-10-CM | POA: Diagnosis not present

## 2022-01-24 DIAGNOSIS — M4692 Unspecified inflammatory spondylopathy, cervical region: Secondary | ICD-10-CM | POA: Diagnosis not present

## 2022-01-24 LAB — DIFFERENTIAL
Abs Immature Granulocytes: 0.04 10*3/uL (ref 0.00–0.07)
Basophils Absolute: 0 10*3/uL (ref 0.0–0.1)
Basophils Relative: 1 %
Eosinophils Absolute: 0.2 10*3/uL (ref 0.0–0.5)
Eosinophils Relative: 4 %
Immature Granulocytes: 1 %
Lymphocytes Relative: 19 %
Lymphs Abs: 1 10*3/uL (ref 0.7–4.0)
Monocytes Absolute: 0.6 10*3/uL (ref 0.1–1.0)
Monocytes Relative: 11 %
Neutro Abs: 3.4 10*3/uL (ref 1.7–7.7)
Neutrophils Relative %: 64 %

## 2022-01-24 LAB — COMPREHENSIVE METABOLIC PANEL
ALT: 24 U/L (ref 0–44)
AST: 24 U/L (ref 15–41)
Albumin: 3 g/dL — ABNORMAL LOW (ref 3.5–5.0)
Alkaline Phosphatase: 106 U/L (ref 38–126)
Anion gap: 10 (ref 5–15)
BUN: 16 mg/dL (ref 8–23)
CO2: 27 mmol/L (ref 22–32)
Calcium: 8.8 mg/dL — ABNORMAL LOW (ref 8.9–10.3)
Chloride: 103 mmol/L (ref 98–111)
Creatinine, Ser: 4 mg/dL — ABNORMAL HIGH (ref 0.44–1.00)
GFR, Estimated: 11 mL/min — ABNORMAL LOW (ref >60)
Glucose, Bld: 107 mg/dL — ABNORMAL HIGH (ref 70–99)
Potassium: 4.5 mmol/L (ref 3.5–5.1)
Sodium: 140 mmol/L (ref 135–145)
Total Bilirubin: 0.5 mg/dL (ref 0.3–1.2)
Total Protein: 6.2 g/dL — ABNORMAL LOW (ref 6.5–8.1)

## 2022-01-24 LAB — I-STAT CHEM 8, ED
BUN: 19 mg/dL (ref 8–23)
Calcium, Ion: 1.09 mmol/L — ABNORMAL LOW (ref 1.15–1.40)
Chloride: 104 mmol/L (ref 98–111)
Creatinine, Ser: 4.5 mg/dL — ABNORMAL HIGH (ref 0.44–1.00)
Glucose, Bld: 106 mg/dL — ABNORMAL HIGH (ref 70–99)
HCT: 26 % — ABNORMAL LOW (ref 36.0–46.0)
Hemoglobin: 8.8 g/dL — ABNORMAL LOW (ref 12.0–15.0)
Potassium: 4.5 mmol/L (ref 3.5–5.1)
Sodium: 139 mmol/L (ref 135–145)
TCO2: 29 mmol/L (ref 22–32)

## 2022-01-24 LAB — CBG MONITORING, ED
Glucose-Capillary: 103 mg/dL — ABNORMAL HIGH (ref 70–99)
Glucose-Capillary: 148 mg/dL — ABNORMAL HIGH (ref 70–99)

## 2022-01-24 LAB — CBC
HCT: 28.3 % — ABNORMAL LOW (ref 36.0–46.0)
Hemoglobin: 8.8 g/dL — ABNORMAL LOW (ref 12.0–15.0)
MCH: 31.3 pg (ref 26.0–34.0)
MCHC: 31.1 g/dL (ref 30.0–36.0)
MCV: 100.7 fL — ABNORMAL HIGH (ref 80.0–100.0)
Platelets: 164 10*3/uL (ref 150–400)
RBC: 2.81 MIL/uL — ABNORMAL LOW (ref 3.87–5.11)
RDW: 14.7 % (ref 11.5–15.5)
WBC: 5.3 10*3/uL (ref 4.0–10.5)
nRBC: 0 % (ref 0.0–0.2)

## 2022-01-24 LAB — PROTIME-INR
INR: 1 (ref 0.8–1.2)
Prothrombin Time: 13.4 seconds (ref 11.4–15.2)

## 2022-01-24 LAB — RESP PANEL BY RT-PCR (FLU A&B, COVID) ARPGX2
Influenza A by PCR: NEGATIVE
Influenza B by PCR: NEGATIVE
SARS Coronavirus 2 by RT PCR: NEGATIVE

## 2022-01-24 LAB — APTT: aPTT: 27 seconds (ref 24–36)

## 2022-01-24 IMAGING — MR MR CERVICAL SPINE W/O CM
4 of 5 series · 18 of 48 positions shown · non-contrast
Comparison: None.

CLINICAL DATA: Loss of dexterity in right hand starting [DATE]

EXAM:
MRI CERVICAL SPINE WITHOUT CONTRAST
TECHNIQUE: Multiplanar, multisequence MR imaging of the cervical spine was
performed. No intravenous contrast was administered.

[Series 4: T2 · sagittal · 3.0mm · 0.43mm/px · 5 of 16 slices shown (1 of 2)]
[im 1/16]
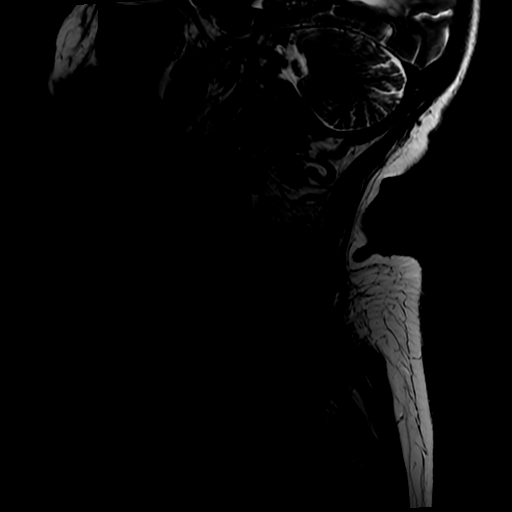
[im 4/16]
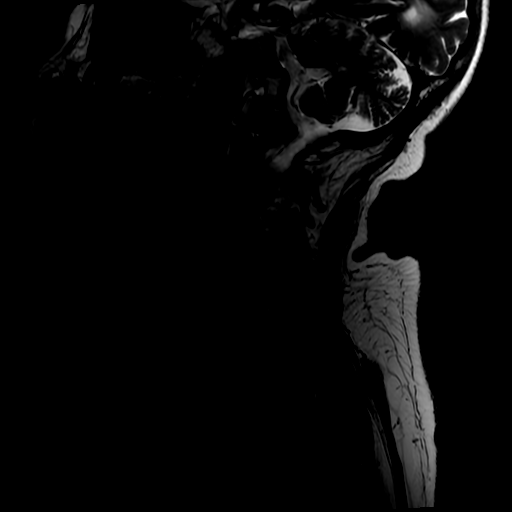
[im 8/16]
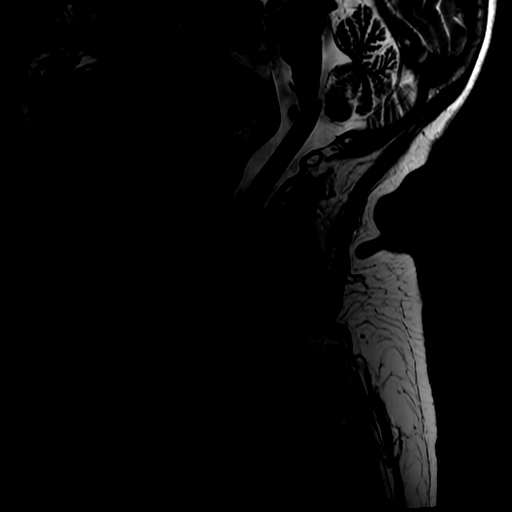
[im 12/16]
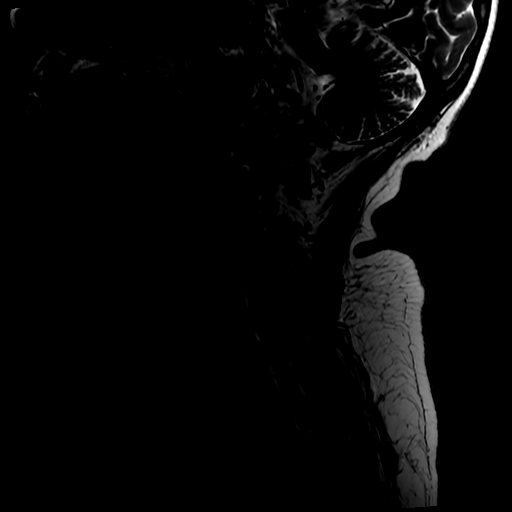
[im 16/16]
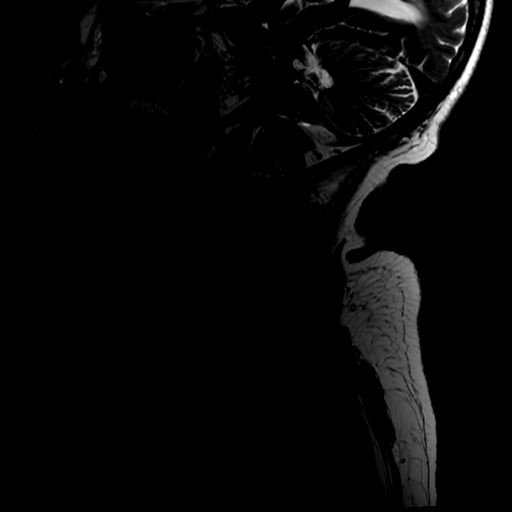

[Series 5: FLAIR · sagittal · 3.0mm · 0.43mm/px · 3 of 16 slices shown]
[im 1/16]
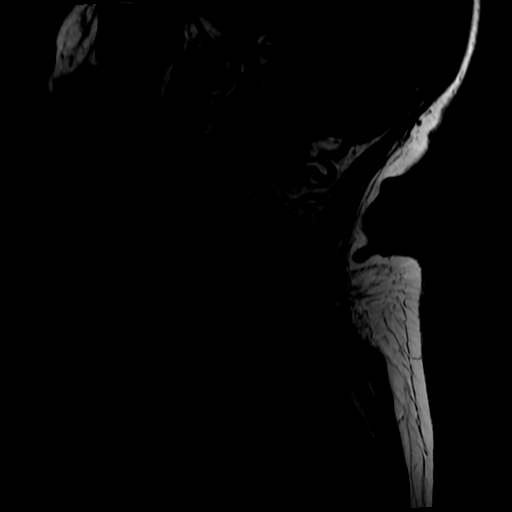
[im 8/16]
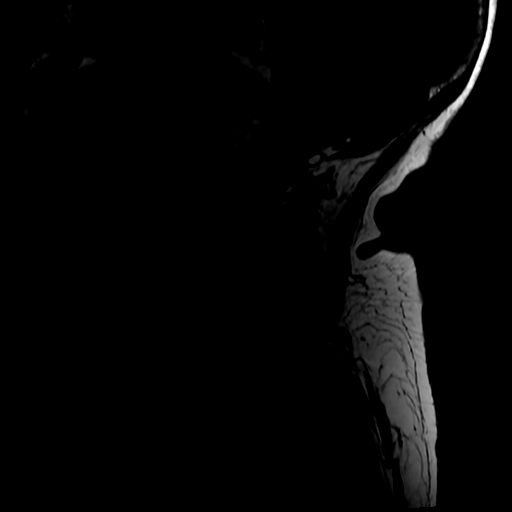
[im 16/16]
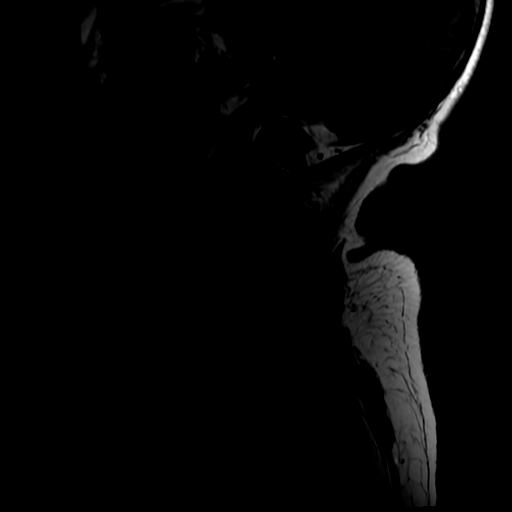

[Series 9: T2 · axial · 3.0mm · 0.35mm/px · z∈[-165,-93]mm · 7 of 27 slices shown (2 of 2)]
[im 1/27]
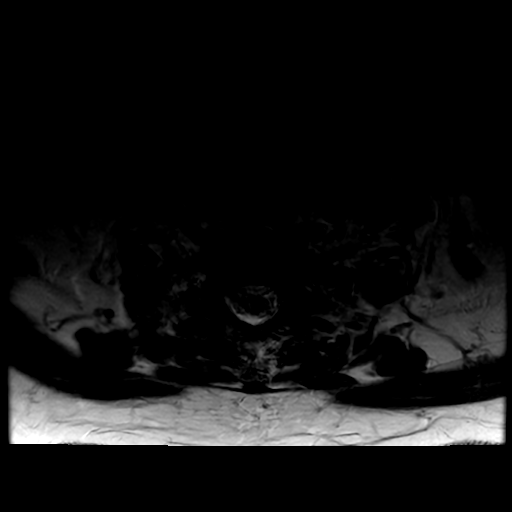
[im 4/27]
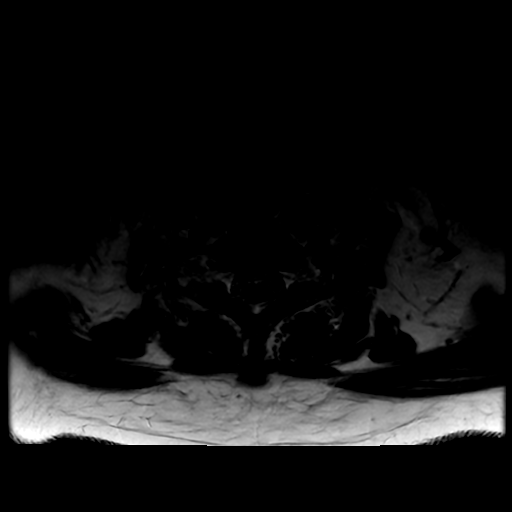
[im 7/27]
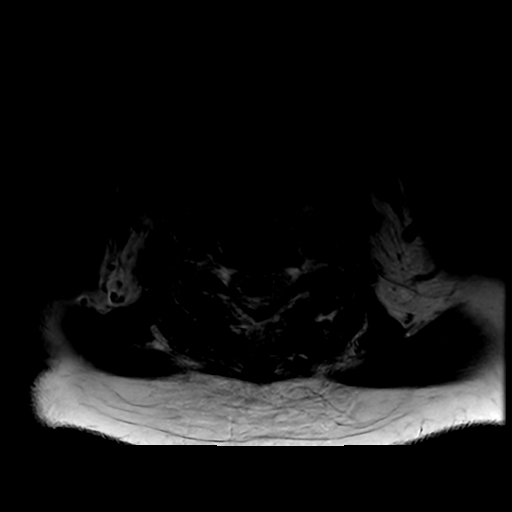
[im 10/27]
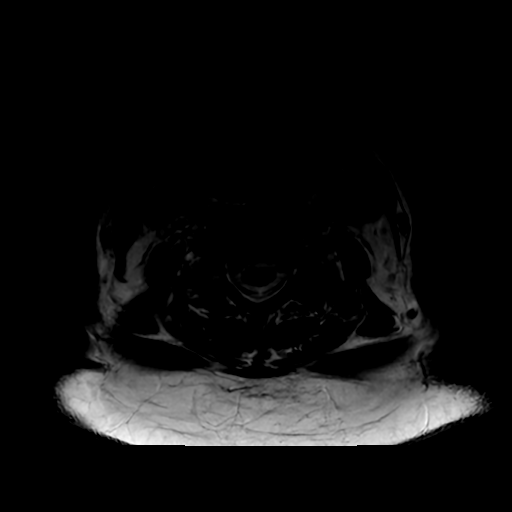
[im 14/27]
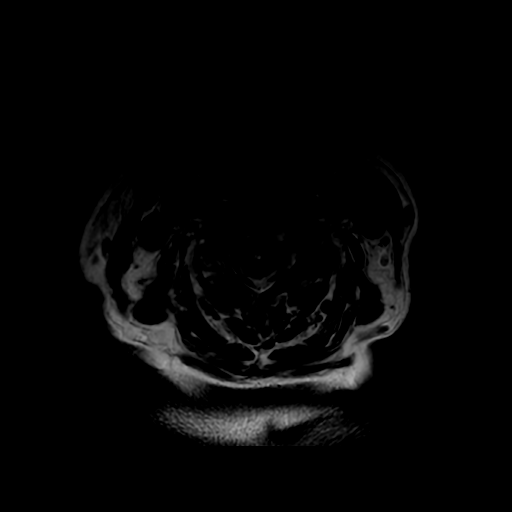
[im 17/27]
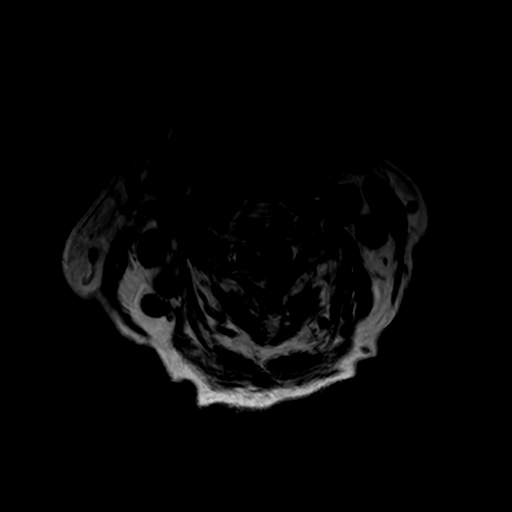
[im 23/27]
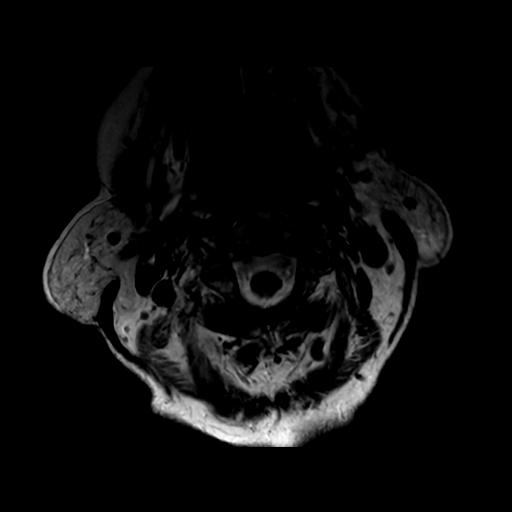

[Series 10: STIR · sagittal · 3.0mm · 0.43mm/px · 3 of 16 slices shown]
[im 1/16]
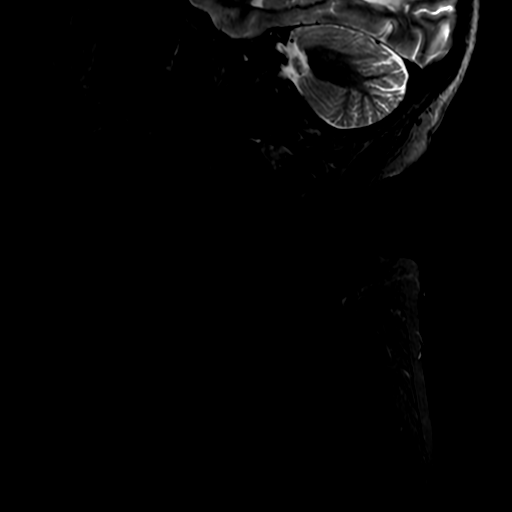
[im 8/16]
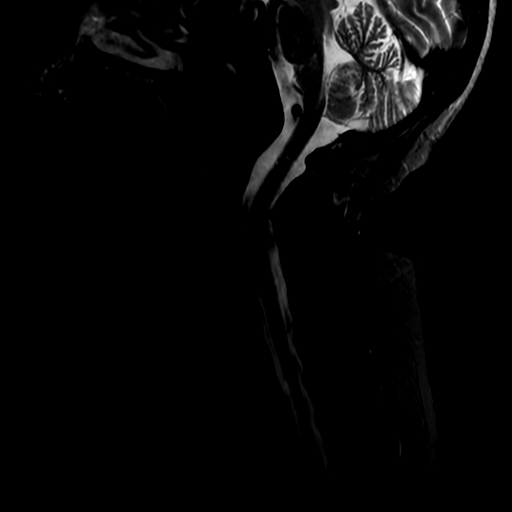
[im 16/16]
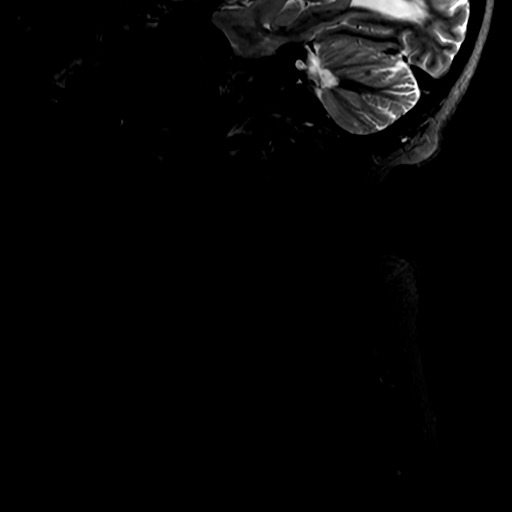

[18 of 48 positions shown; findings below may reference images not displayed]

FINDINGS: There is intermittent motion degradation. The sagittal T1 images are
mildly motion degraded, and the axial images are moderately motion
degraded. Within this confine:

Alignment: Normal.

Vertebrae: Vertebral body heights are preserved. There is no
suspicious marrow signal abnormality or marrow edema.

Cord: Normal in signal and morphology, within the confines of motion
degraded images.

Posterior Fossa, vertebral arteries, paraspinal tissues: The
posterior fossa is assessed on the separately dictated brain MRI.
The vertebral artery flow voids are present. The paraspinal soft
tissues are unremarkable.

Disc levels:

The disc heights are overall preserved.

C2-C3: No significant spinal canal or neural foraminal stenosis

C3-C4: No significant spinal canal or neural foraminal stenosis

C4-C5: There is left worse than right facet arthropathy with
prominent left-sided osteophytosis/ligamentum flavum thickening
(9-12). Findings result in mild spinal canal stenosis with slight
rightward displacement of the cervical cord but no evidence of frank
cord compression or signal abnormality. There is mild left and no
significant right neural foraminal stenosis.

C5-C6: Mild uncovertebral and facet arthropathy resulting in mild
left and no significant right neural foraminal stenosis and no
significant spinal canal stenosis.

C6-C7: No significant spinal canal or neural foraminal stenosis.

C7-T1: No significant spinal canal or neural foraminal stenosis.
IMPRESSION: 1. Left worse than right facet arthropathy with prominent left
osteophytosis/ligamentum flavum thickening resulting in mild spinal
canal stenosis with slight rightward displacement of the cord but no
frank cord compression or definite cord signal abnormality.
2. Otherwise, minimal for age degenerative changes in the cervical
spine without other high-grade spinal canal or neural foraminal
stenosis.

## 2022-01-24 IMAGING — MR MR HEAD W/O CM
6 of 10 series · 29 of 48 positions shown · non-contrast
Comparison: None.

CLINICAL DATA: Loss of dexterity in right hand

EXAM:
MRI HEAD WITHOUT CONTRAST
TECHNIQUE: Multiplanar, multiecho pulse sequences of the brain and surrounding
structures were obtained without intravenous contrast.

[Series 2: DWI · axial · 3.0mm · 0.94mm/px · z∈[-44,+97]mm · 9 of 96 slices shown (1 of 2)]
[im 1/96]
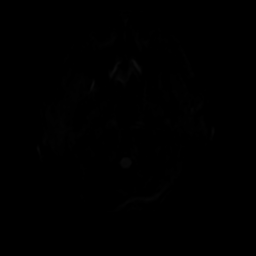
[im 12/96]
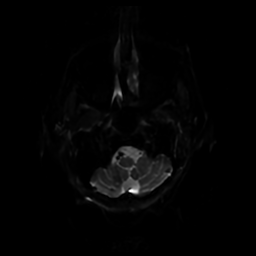
[im 24/96]
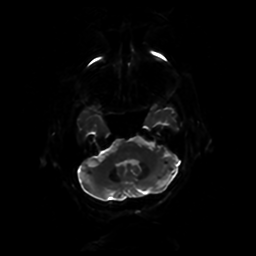
[im 36/96]
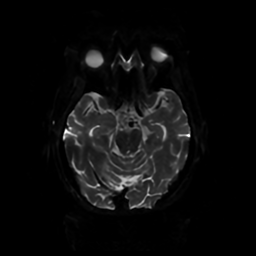
[im 48/96]
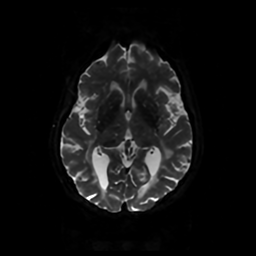
[im 60/96]
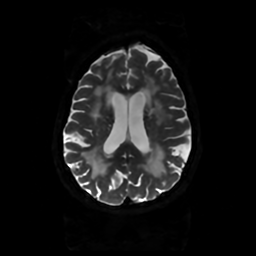
[im 72/96]
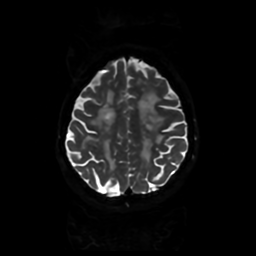
[im 84/96]
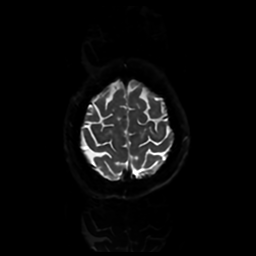
[im 96/96]
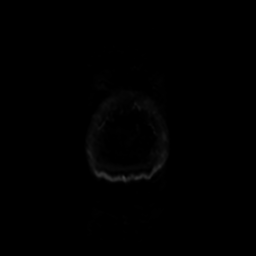

[Series 3: DWI · coronal · 4.0mm · 0.94mm/px · 7 of 68 slices shown (2 of 2)]
[im 1/68]
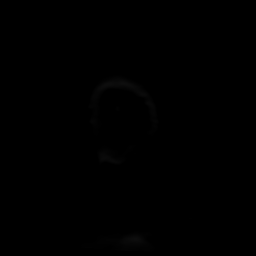
[im 12/68]
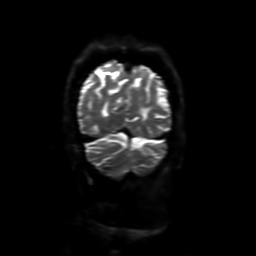
[im 23/68]
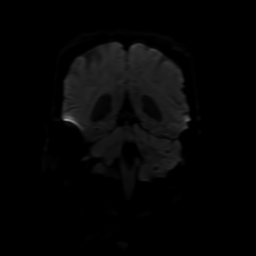
[im 34/68]
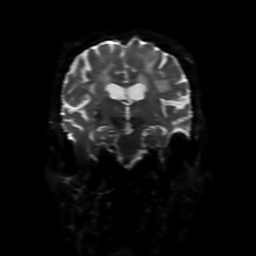
[im 45/68]
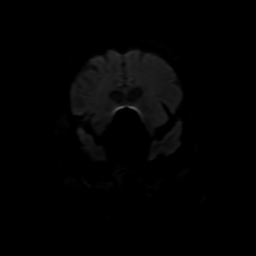
[im 56/68]
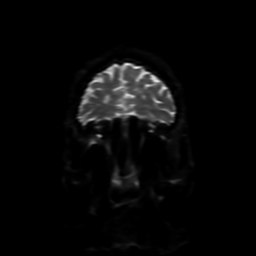
[im 68/68]
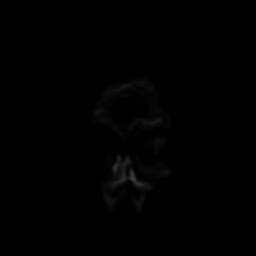

[Series 4: FLAIR · sagittal · 5.0mm · 0.23mm/px · 2 of 23 slices shown (1 of 2)]
[im 1/23]
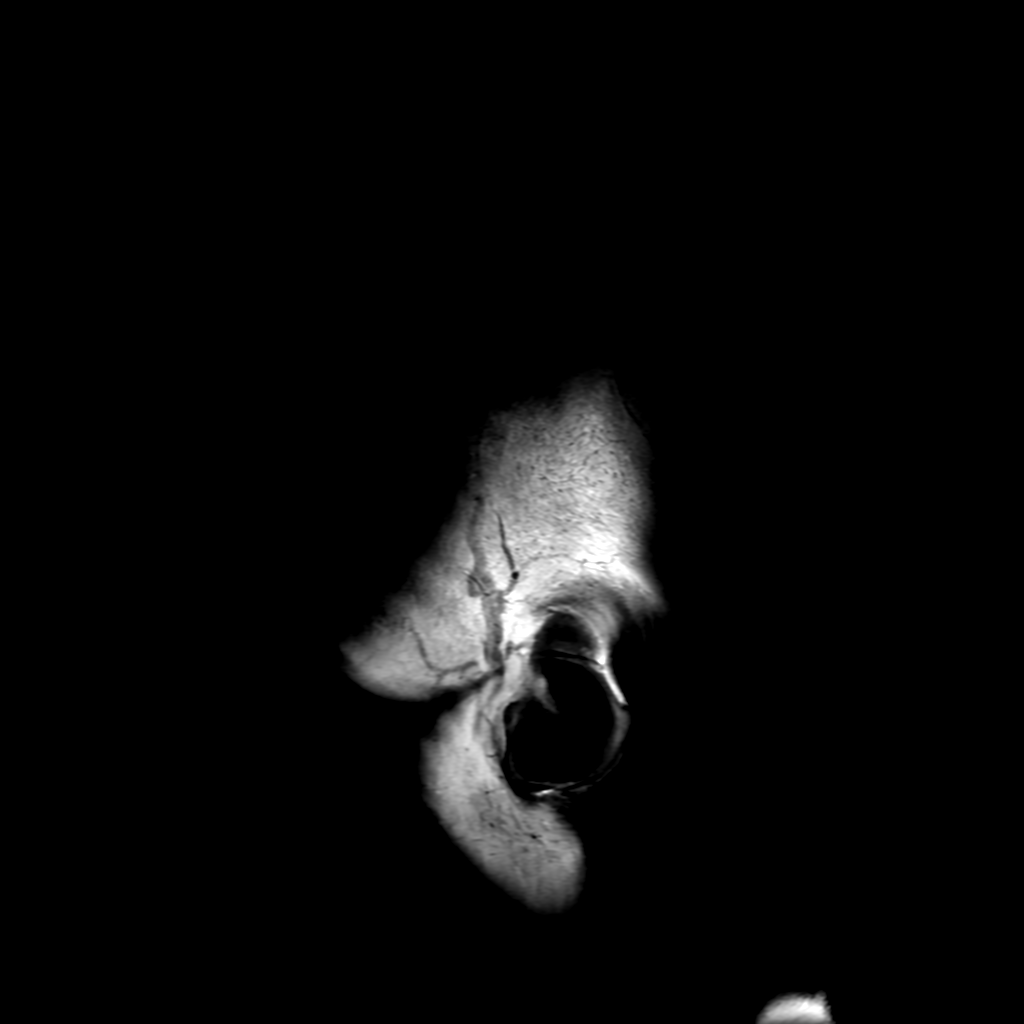
[im 23/23]
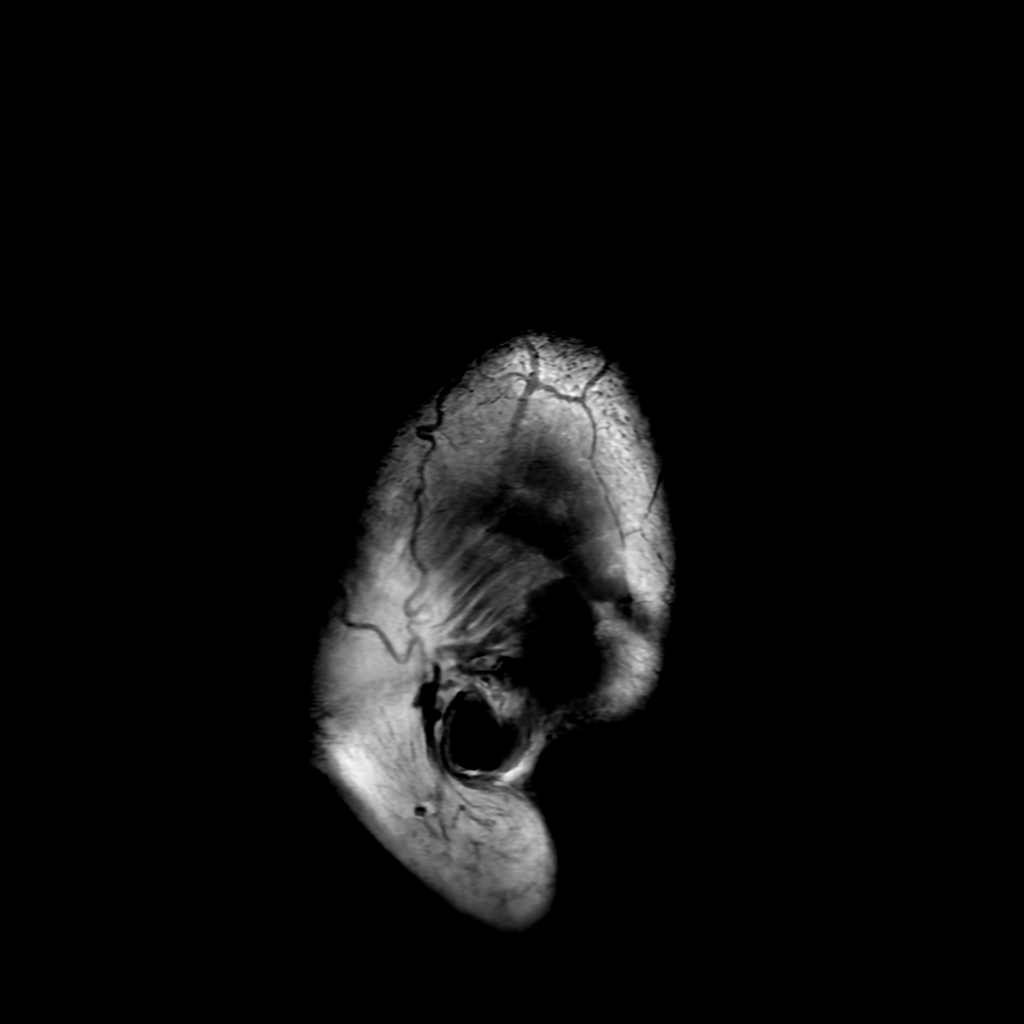

[Series 6: FLAIR · axial · 4.0mm · 0.45mm/px · z∈[-44,+97]mm · 3 of 33 slices shown (2 of 2)]
[im 1/33]
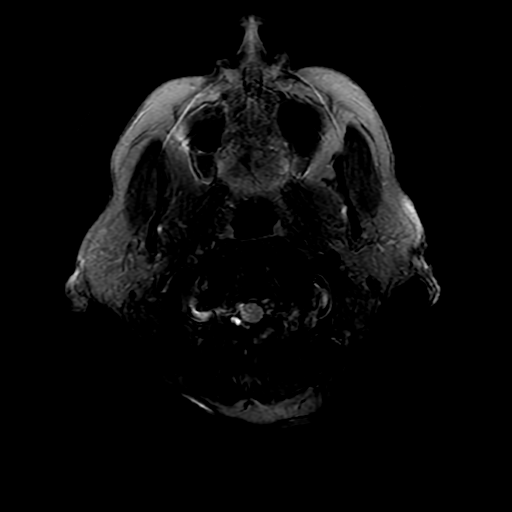
[im 17/33]
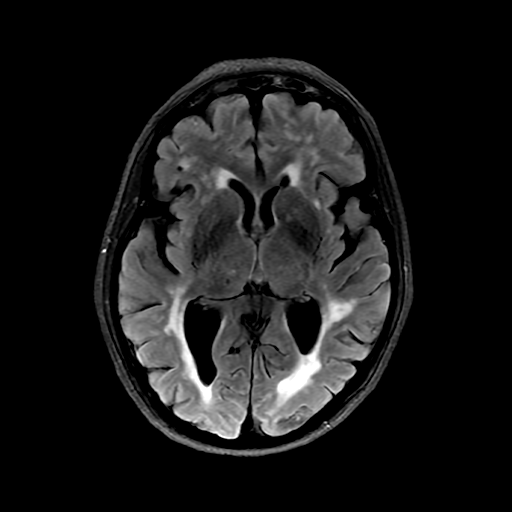
[im 33/33]
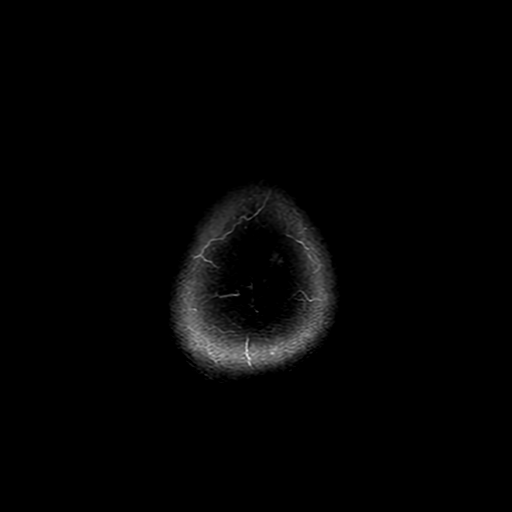

[Series 250: ADC · axial · 3.0mm · 0.94mm/px · z∈[-44,+97]mm · 5 of 48 slices shown (1 of 2)]
[im 1/48]
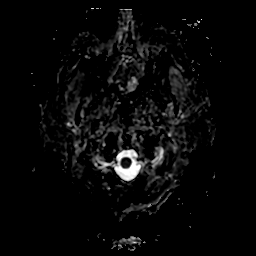
[im 12/48]
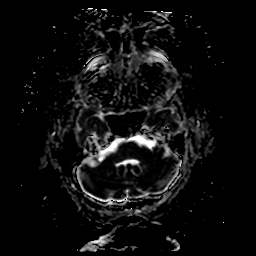
[im 24/48]
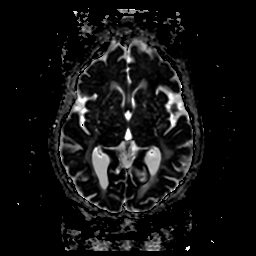
[im 36/48]
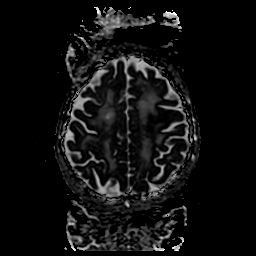
[im 48/48]
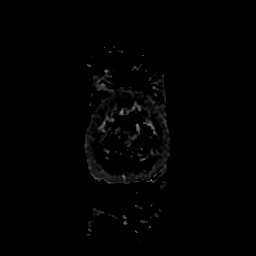

[Series 350: ADC · coronal · 4.0mm · 0.94mm/px · 3 of 34 slices shown (2 of 2)]
[im 1/34]
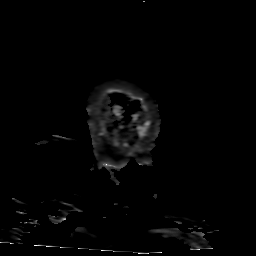
[im 17/34]
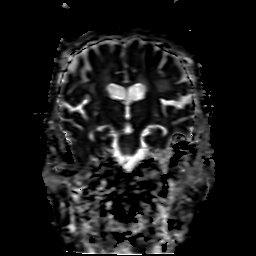
[im 34/34]
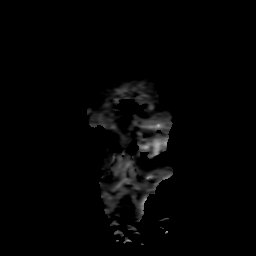

[29 of 48 positions shown; findings below may reference images not displayed]

FINDINGS: Brain: There are scattered infarcts in both cerebral and cerebellar
hemispheres, some of which appear acute while others appear more
subacute. The largest infarct is in the left of the frontal lobe
with involvement precentral gyrus and likely accounts for the
patient's symptoms. Additional acute to subacute infarcts are seen
in the right superior frontal gyrus, bilateral temporoparietal
periventricular white matter and right occipital lobe. There is no
significant hemorrhage associated with these infarcts.

There is no acute extra-axial fluid collection. There is a
background of mild global parenchymal volume loss with prominence of
the ventricular system and extra-axial CSF spaces. There is
extensive confluent FLAIR signal abnormality throughout the
subcortical and periventricular white matter likely reflecting
sequela of advanced chronic white matter microangiopathy. There is
an additional remote infarct in the left cerebellar hemisphere and
small remote infarct in the left corona radiata.

There are extensive punctate chronic microhemorrhages throughout the
cerebral and cerebellar hemispheres which are predominantly
peripheral in distribution.

There is no mass lesion.  There is no midline shift.

Vascular: Normal flow voids.

Skull and upper cervical spine: Normal marrow signal.

Sinuses/Orbits: The paranasal sinuses are clear. Bilateral lens
implants are in place. The globes and orbits are otherwise
unremarkable.

Other: There is a left mastoid effusion.
IMPRESSION: 1. Scattered infarcts in the bilateral cerebral hemispheres as
detailed above, some of which appear acute while others appear more
subacute. The largest infarct is in the left frontal lobe with
involvement of the precentral gyrus, likely accounting for the
patient's reported symptoms. Consider embolic etiology given
involvement of multiple vascular territories.
2. Background of mild global parenchymal volume loss and advanced
chronic white matter microangiopathy. Small remote infarcts in the
left corona radiata and left cerebellar hemisphere.
3. Extensive punctate chronic microhemorrhages throughout the
cerebral and cerebellar hemispheres predominantly peripheral in
distribution concerning for cerebral amyloid angiopathy.

## 2022-01-24 MED ORDER — ASPIRIN 325 MG PO TABS
325.0000 mg | ORAL_TABLET | Freq: Every day | ORAL | Status: DC
  Administered 2022-01-24 – 2022-01-26 (×3): 325 mg via ORAL
  Filled 2022-01-24 (×3): qty 1

## 2022-01-24 MED ORDER — EPINEPHRINE 0.3 MG/0.3ML IJ SOAJ
0.3000 mg | Freq: Every day | INTRAMUSCULAR | Status: DC | PRN
  Filled 2022-01-24: qty 0.6

## 2022-01-24 MED ORDER — METHIMAZOLE 5 MG PO TABS
7.5000 mg | ORAL_TABLET | Freq: Every day | ORAL | Status: DC
  Administered 2022-01-25 – 2022-01-26 (×2): 7.5 mg via ORAL
  Filled 2022-01-24 (×2): qty 2

## 2022-01-24 MED ORDER — SODIUM CHLORIDE 0.9% FLUSH: 3.0000 mL | Freq: Once | INTRAVENOUS | Status: DC

## 2022-01-24 MED ORDER — ACETAMINOPHEN 325 MG PO TABS: 650.0000 mg | ORAL_TABLET | ORAL | Status: DC | PRN

## 2022-01-24 MED ORDER — STROKE: EARLY STAGES OF RECOVERY BOOK
Freq: Once | Status: AC
  Filled 2022-01-24: qty 1

## 2022-01-24 MED ORDER — ROPINIROLE HCL 1 MG PO TABS
3.0000 mg | ORAL_TABLET | Freq: Every day | ORAL | Status: DC
  Administered 2022-01-24 – 2022-01-25 (×2): 3 mg via ORAL
  Filled 2022-01-24 (×3): qty 3

## 2022-01-24 MED ORDER — PANTOPRAZOLE SODIUM 40 MG PO TBEC
40.0000 mg | DELAYED_RELEASE_TABLET | Freq: Every day | ORAL | Status: DC
  Administered 2022-01-25 – 2022-01-26 (×2): 40 mg via ORAL
  Filled 2022-01-24 (×2): qty 1

## 2022-01-24 MED ORDER — ATORVASTATIN CALCIUM 40 MG PO TABS
40.0000 mg | ORAL_TABLET | Freq: Every day | ORAL | Status: DC
  Administered 2022-01-25 – 2022-01-26 (×2): 40 mg via ORAL
  Filled 2022-01-24 (×2): qty 1

## 2022-01-24 MED ORDER — SENNOSIDES-DOCUSATE SODIUM 8.6-50 MG PO TABS: 1.0000 | ORAL_TABLET | Freq: Every evening | ORAL | Status: DC | PRN

## 2022-01-24 MED ORDER — CLONAZEPAM 0.5 MG PO TABS: 0.5000 mg | ORAL_TABLET | Freq: Two times a day (BID) | ORAL | Status: DC | PRN

## 2022-01-24 MED ORDER — TRAZODONE HCL 50 MG PO TABS
50.0000 mg | ORAL_TABLET | Freq: Every evening | ORAL | Status: DC | PRN
  Administered 2022-01-24: 50 mg via ORAL
  Filled 2022-01-24: qty 1

## 2022-01-24 MED ORDER — LORAZEPAM 0.5 MG PO TABS: 0.5000 mg | ORAL_TABLET | Freq: Once | ORAL | Status: DC

## 2022-01-24 MED ORDER — HEPARIN SODIUM (PORCINE) 5000 UNIT/ML IJ SOLN
5000.0000 [IU] | Freq: Three times a day (TID) | INTRAMUSCULAR | Status: DC
  Administered 2022-01-24 – 2022-01-26 (×5): 5000 [IU] via SUBCUTANEOUS
  Filled 2022-01-24 (×4): qty 1

## 2022-01-24 MED ORDER — ENOXAPARIN SODIUM 30 MG/0.3ML IJ SOSY: 30.0000 mg | PREFILLED_SYRINGE | INTRAMUSCULAR | Status: DC

## 2022-01-24 MED ORDER — INSULIN ASPART 100 UNIT/ML IJ SOLN: 0.0000 [IU] | Freq: Three times a day (TID) | INTRAMUSCULAR | Status: DC

## 2022-01-24 MED ORDER — ASPIRIN 300 MG RE SUPP: 300.0000 mg | Freq: Every day | RECTAL | Status: DC

## 2022-01-24 MED ORDER — ACETAMINOPHEN 650 MG RE SUPP: 650.0000 mg | RECTAL | Status: DC | PRN

## 2022-01-24 MED ORDER — ACETAMINOPHEN 160 MG/5ML PO SOLN: 650.0000 mg | ORAL | Status: DC | PRN

## 2022-01-24 NOTE — H&P (Signed)
History and Physical   Monica Hunt ZSW:[id number] DOB: [birth year] DOA: 01/24/2022  PCP: Monica Orn, MD   Patient coming from: Home/PCP office  Chief Complaint: Right hand weakness  HPI: Monica Hunt is a 81 y.o. female with medical history significant of syncope, hypertension, diabetes, ESRD on HD, depression, anxiety, anemia, GERD, hyperlipidemia, restless leg syndrome, hypothyroidism presents with acute neurologic deficit of hand weakness.  Patient states that she noted decreased dexterity/weakness of her right hand starting around 2 weeks ago.  She was evaluated by her PCP today who recommended patient come to the ED for possible stroke work-up.  Patient denies any other focal neurologic deficits.  She did recently transition from peritoneal dialysis to hemodialysis.  She had her catheter placed 2 weeks ago, after her hand symptoms began because she was having difficulty doing her home peritoneal dialysis with her hand symptoms.   Patient denies fevers, chills, chest pain, shortness of breath, abdominal pain, constipation, diarrhea, nausea, vomiting.  ED Course: Vital signs in the ED are stable.  Lab work-up included CMP which showed creatinine stable at 4, calcium 8.8, protein 6.2, albumin 3.0.  CBC showed hemoglobin stable at 8.8.  PT, PTT, INR within normal limits.  Rest were panel flu COVID pending.  MRI of the brain showed scattered infarct bilaterally both acute and subacute infarcts.  Small old infarcts also noted and there is microhemorrhages with distribution concerning for cerebral amyloid angiopathy.  MRI of the C-spine also performed and showed facet arthropathy with spinal stenosis without any evidence of cord compression.  Otherwise normal.  Patient received dose of Ativan in ED.  EDP states that they will consult neurology shortly.  Review of Systems: As per HPI otherwise all other systems reviewed and are negative.  Past Medical History:  Diagnosis Date   Cataracts,  bilateral    Depression    Diabetes mellitus without complication ([place])    Hypertension     Past Surgical History:  Procedure Laterality Date   ABDOMINAL HYSTERECTOMY     vaginal   CARPAL TUNNEL RELEASE Right    COLONOSCOPY WITH PROPOFOL N/A 11/17/2014   Procedure: COLONOSCOPY WITH PROPOFOL;  Surgeon: Garlan Fair, MD;  Location: [facility];  Service: Endoscopy;  Laterality: N/A;    Social History  reports that she has been smoking cigarettes. She has a 22.50 pack-year smoking history. She has never used smokeless tobacco. She reports that she does not drink alcohol and does not use drugs.  Allergies  Allergen Reactions   Bee Venom Anaphylaxis   Other Anaphylaxis    Wasp sting    Family History  Problem Relation Age of Onset   Kidney cancer Mother    Lung cancer Father   Reviewed on admission  Prior to Admission medications   Medication Sig Start Date End Date Taking? Authorizing Provider  ACCU-CHEK SMARTVIEW test strip  01/12/20   [provider]  atorvastatin (LIPITOR) 40 MG tablet Take 40 mg by mouth daily.    [provider]  cholecalciferol (VITAMIN D) 1000 UNITS tablet Take 1,000 Units by mouth daily.    [provider]  clonazePAM (KLONOPIN) 0.5 MG tablet Take 0.5 mg by mouth 2 (two) times daily as needed for anxiety.    [provider]  EPINEPHrine 0.3 mg/0.3 mL IJ SOAJ injection Inject 0.3 mg into the muscle once.    [provider]  furosemide (LASIX) 40 MG tablet Take 40 mg by mouth as needed.    [provider]  gentamicin cream (GARAMYCIN) 0.1 % Apply topically. 07/26/21   [provider]  glipiZIDE (GLUCOTROL XL) 2.5 MG 24 hr tablet Take by mouth. 08/16/21   [provider]  IRON PO Take by mouth.    [provider]  methimazole (TAPAZOLE) 5 MG tablet Take 5 mg by mouth daily. Take 1 and 1/2 tab alternating once a day    [provider]  Multiple Vitamin (MULTIVITAMIN  WITH MINERALS) TABS tablet Take 1 tablet by mouth daily.    [provider]  omeprazole (PRILOSEC) 40 MG capsule Take 40 mg by mouth daily.    [provider]  rOPINIRole (REQUIP) 3 MG tablet Take 3 mg by mouth at bedtime. Take 1 tab 1 to 3 hours before bedtime    [provider]  traZODone (DESYREL) 50 MG tablet 50 mg. Take 1-2 tabs daily at bedtime    [provider]    Physical Exam: Vitals:   01/24/22 1635 01/24/22 1700 01/24/22 1715 01/24/22 1815  BP:  (!) 121/56 137/77 (!) 111/100  Pulse:  79 88 86  Resp:  19 (!) 21 (!) 22  Temp:      TempSrc:      SpO2:  100% 99% 100%  Weight: 57.6 kg     Height: 5\' 2"  (9.147 m)       Physical Exam Constitutional:      General: She is not in acute distress.    Appearance: Normal appearance.  HENT:     Head: Normocephalic and atraumatic.     Mouth/Throat:     Mouth: Mucous membranes are moist.     Pharynx: Oropharynx is clear.  Eyes:     Extraocular Movements: Extraocular movements intact.     Pupils: Pupils are equal, round, and reactive to light.  Cardiovascular:     Rate and Rhythm: Normal rate and regular rhythm.     Pulses: Normal pulses.     Heart sounds: Normal heart sounds.  Pulmonary:     Effort: Pulmonary effort is normal. No respiratory distress.     Breath sounds: Normal breath sounds.  Abdominal:     General: Bowel sounds are normal. There is no distension.     Palpations: Abdomen is soft.     Tenderness: There is no abdominal tenderness.  Musculoskeletal:        General: No swelling or deformity.  Skin:    General: Skin is warm and dry.  Neurological:     Comments: Mental Status: Patient is awake, alert, oriented x3 No signs of aphasia or neglect Cranial Nerves: II: Pupils equal, round, and reactive to light.   III,IV, VI: EOMI without ptosis or diploplia.  V: Facial sensation is symmetric to light touch and  Temperature. VII: Facial movement is symmetric.  VIII: hearing is  intact to voice X: Uvula elevates symmetrically XI: Shoulder shrug is symmetric. XII: tongue is midline without atrophy or fasciculations.  Motor: Good effort thorughout, at Least 5/5 left upper extremity, 4/5 right upper extremity (specifically hand), 5/5 bilateral lower extremitiy  Sensory: Sensation is grossly intact bilateral UEs & LEs Cerebellar: Finger-Nose intact bilalat   Labs on Admission: I have personally reviewed following labs and imaging studies  CBC: Recent Labs  Lab 01/24/22 1405 01/24/22 1420  WBC 5.3  --   NEUTROABS 3.4  --   HGB 8.8* 8.8*  HCT 28.3* 26.0*  MCV 100.7*  --   PLT 164  --     Basic Metabolic  Panel: Recent Labs  Lab 01/24/22 1405 01/24/22 1420  NA 140 139  K 4.5 4.5  CL 103 104  CO2 27  --   GLUCOSE 107* 106*  BUN 16 19  CREATININE 4.00* 4.50*  CALCIUM 8.8*  --     GFR: Estimated Creatinine Clearance: 8 mL/min (A) (by C-G formula based on SCr of 4.5 mg/dL (H)).  Liver Function Tests: Recent Labs  Lab 01/24/22 1405  AST 24  ALT 24  ALKPHOS 106  BILITOT 0.5  PROT 6.2*  ALBUMIN 3.0*    Urine analysis: No results found for: COLORURINE, APPEARANCEUR, LABSPEC, PHURINE, GLUCOSEU, HGBUR, BILIRUBINUR, KETONESUR, PROTEINUR, UROBILINOGEN, NITRITE, LEUKOCYTESUR  Radiological Exams on Admission: MR BRAIN WO CONTRAST  Result Date: 01/24/2022 CLINICAL DATA:  Loss of dexterity in right hand EXAM: MRI HEAD WITHOUT CONTRAST TECHNIQUE: Multiplanar, multiecho pulse sequences of the brain and surrounding structures were obtained without intravenous contrast. COMPARISON:  None. FINDINGS: Brain: There are scattered infarcts in both cerebral and cerebellar hemispheres, some of which appear acute while others appear more subacute. The largest infarct is in the left of the frontal lobe with involvement precentral gyrus and likely accounts for the patient's symptoms. Additional acute to subacute infarcts are seen in the right superior frontal gyrus,  bilateral temporoparietal periventricular white matter and right occipital lobe. There is no significant hemorrhage associated with these infarcts. There is no acute extra-axial fluid collection. There is a background of mild global parenchymal volume loss with prominence of the ventricular system and extra-axial CSF spaces. There is extensive confluent FLAIR signal abnormality throughout the subcortical and periventricular white matter likely reflecting sequela of advanced chronic white matter microangiopathy. There is an additional remote infarct in the left cerebellar hemisphere and small remote infarct in the left corona radiata. There are extensive punctate chronic microhemorrhages throughout the cerebral and cerebellar hemispheres which are predominantly peripheral in distribution. There is no mass lesion.  There is no midline shift. Vascular: Normal flow voids. Skull and upper cervical spine: Normal marrow signal. Sinuses/Orbits: The paranasal sinuses are clear. Bilateral lens implants are in place. The globes and orbits are otherwise unremarkable. Other: There is a left mastoid effusion. IMPRESSION: 1. Scattered infarcts in the bilateral cerebral hemispheres as detailed above, some of which appear acute while others appear more subacute. The largest infarct is in the left frontal lobe with involvement of the precentral gyrus, likely accounting for the patient's reported symptoms. Consider embolic etiology given involvement of multiple vascular territories. 2. Background of mild global parenchymal volume loss and advanced chronic white matter microangiopathy. Small remote infarcts in the left corona radiata and left cerebellar hemisphere. 3. Extensive punctate chronic microhemorrhages throughout the cerebral and cerebellar hemispheres predominantly peripheral in distribution concerning for cerebral amyloid angiopathy. Electronically Signed   By: Valetta Mole M.D.   On: 01/24/2022 16:41   MR Cervical Spine  Wo Contrast  Result Date: 01/24/2022 CLINICAL DATA:  Loss of dexterity in right hand starting 01/09/2022 EXAM: MRI CERVICAL SPINE WITHOUT CONTRAST TECHNIQUE: Multiplanar, multisequence MR imaging of the cervical spine was performed. No intravenous contrast was administered. COMPARISON:  None. FINDINGS: There is intermittent motion degradation. The sagittal T1 images are mildly motion degraded, and the axial images are moderately motion degraded. Within this confine: Alignment: Normal. Vertebrae: Vertebral body heights are preserved. There is no suspicious marrow signal abnormality or marrow edema. Cord: Normal in signal and morphology, within the confines of motion degraded images. Posterior Fossa, vertebral arteries, paraspinal tissues: The posterior fossa is  assessed on the separately dictated brain MRI. The vertebral artery flow voids are present. The paraspinal soft tissues are unremarkable. Disc levels: The disc heights are overall preserved. C2-C3: No significant spinal canal or neural foraminal stenosis C3-C4: No significant spinal canal or neural foraminal stenosis C4-C5: There is left worse than right facet arthropathy with prominent left-sided osteophytosis/ligamentum flavum thickening (9-12). Findings result in mild spinal canal stenosis with slight rightward displacement of the cervical cord but no evidence of frank cord compression or signal abnormality. There is mild left and no significant right neural foraminal stenosis. C5-C6: Mild uncovertebral and facet arthropathy resulting in mild left and no significant right neural foraminal stenosis and no significant spinal canal stenosis. C6-C7: No significant spinal canal or neural foraminal stenosis. C7-T1: No significant spinal canal or neural foraminal stenosis. IMPRESSION: 1. Left worse than right facet arthropathy with prominent left osteophytosis/ligamentum flavum thickening resulting in mild spinal canal stenosis with slight rightward displacement  of the cord but no frank cord compression or definite cord signal abnormality. 2. Otherwise, minimal for age degenerative changes in the cervical spine without other high-grade spinal canal or neural foraminal stenosis. Electronically Signed   By: Valetta Mole M.D.   On: 01/24/2022 16:18    EKG: Independently reviewed.  Sinus rhythm at 90 bpm with PACs.  Low voltage in multiple leads.  Assessment/Plan Principal Problem:   Acute CVA (cerebrovascular accident) [facility]) Active Problems:   Controlled type 2 diabetes mellitus with kidney complication, without long-term current use of insulin (HCC)   Essential hypertension   Anxiety   Anemia in chronic kidney disease   End stage renal failure on dialysis (HCC)   Gastroesophageal reflux disease   HLD (hyperlipidemia)   Restless legs   Hyperthyroidism   Acute CVA > Patient presenting with 2 weeks of right hand weakness.  Sent for stroke work-up by PCP > MRI in the ED did show evidence of scattered infarcts both acute and subacute as well as old infarcts noted. > Concerning for thromboembolic etiology.  Unclear source.   - Neurology consulted by EDP - Allow for permissive HTN (systolic < 182 and diastolic < 993)  - ASA 716 mg  - Continue home statin  - Echocardiogram  - Carotid doppler - A1C  - Lipid panel  - Tele monitoring  - SLP eval - PT/OT  Hypertension - Hold antihypertensives in setting of permissive hypertension as above  Hyperlipidemia - Continue home statin  Depression Anxiety Insomnia - Continue home clonazepam as needed - Continue home trazodone  Diabetes - SSI  Hypothyroidism - Continue home methimazole  GERD - Continue home PPI  ESRD on HD > Evaluating if new please catheter could be possible source of thromboembolus leading to stroke. > Last HD session yesterday - We will need consult to nephrology  Anemia of CKD > Hemoglobin noted to be 8.8 from recent baseline of 9.4. - Continue to trend  DVT  prophylaxis: Heparin Code Status:   Full Family Communication:  None on admission, she states daughter is up-to-date with the plan of care. Disposition Plan:   Patient is from:  Home  Anticipated DC to:  Home  Anticipated DC date:  1 to 2 days  Anticipated DC barriers: None  Consults called:  Neurology to be consulted by EDP Admission status:  Observation, telemetry  Severity of Illness: The appropriate patient status for this patient is OBSERVATION. Observation status is judged to be reasonable and necessary in order to provide the required intensity of  service to ensure the patient's safety. The patient's presenting symptoms, physical exam findings, and initial radiographic and laboratory data in the context of their medical condition is felt to place them at decreased risk for further clinical deterioration. Furthermore, it is anticipated that the patient will be medically stable for discharge from the hospital within 2 midnights of admission.    Marcelyn Bruins MD [facility]  How to contact the [facility] [place] Attending or Consulting provider [place] or covering provider during after hours [place], for this patient?   Check the care team in [facility] and look for a) attending/consulting [facility] provider listed and b) the [facility] team listed Log into [email or web address] and use [place]'s universal password to access. If you do not have the password, please contact the hospital operator. Locate the [facility] provider you are looking for under [facility] and page to a number that you can be directly reached. If you still have difficulty reaching the provider, please page the [facility] (Director on Call) for the Hospitalists listed on amion for assistance.  01/24/2022, 6:49 PM

## 2022-01-24 NOTE — ED Provider Notes (Signed)
[facility] EMERGENCY DEPARTMENT Provider Note   CSN: [id number] Arrival date & time: 01/24/22  1329     History  Chief Complaint  Patient presents with   Weakness    Monica Hunt is a 81 y.o. female.  The history is provided by the patient and medical records.  Weakness   Monica Hunt is a 81 y.o. female who presents to the Emergency Department complaining of right upper extremity weakness.  She presents to the emergency department complaining of weakness to the right hand that started 2 weeks ago, abruptly started when she awoke from sleep.  She went to her PCPs office today, who referred her to the emergency department for further work-up.  She denies any associated symptoms.  No headache, neck pain, chest pain, abdominal pain, nausea, vomiting.  No recent falls.  She lives at home alone.  She has end-stage renal disease and dialyzes Monday, Wednesday, Friday.  She just transition to hemodialysis from peritoneal dialysis about 2 weeks ago.  She ambulates independently and does not require a walker or cane. She is right-hand dominant.   Home Medications Prior to Admission medications   Medication Sig Start Date End Date Taking? Authorizing Provider  ACCU-CHEK SMARTVIEW test strip  01/12/20   [provider]  acetaminophen (TYLENOL) 500 MG tablet Take 500-1,000 mg by mouth every 6 (six) hours as needed for mild pain.    [provider]  atorvastatin (LIPITOR) 40 MG tablet Take 40 mg by mouth daily.    [provider]  cholecalciferol (VITAMIN D) 1000 UNITS tablet Take 1,000 Units by mouth daily.    [provider]  clonazePAM (KLONOPIN) 0.5 MG tablet Take 0.5 mg by mouth 2 (two) times daily as needed for anxiety.    [provider]  EPINEPHrine 0.3 mg/0.3 mL IJ SOAJ injection Inject 0.3 mg into the muscle once.    [provider]  furosemide (LASIX) 40 MG tablet Take 40 mg by mouth as needed.    [provider]  gentamicin cream (GARAMYCIN) 0.1 % Apply topically. 07/26/21   [provider]  glipiZIDE (GLUCOTROL XL) 2.5 MG 24 hr tablet Take by mouth. 08/16/21   [provider]  IRON PO Take by mouth.    [provider]  methimazole (TAPAZOLE) 5 MG tablet Take 5 mg by mouth daily. Take 1 and 1/2 tab alternating once a day    [provider]  metoprolol succinate (TOPROL-XL) 25 MG 24 hr tablet Take 25 mg by mouth daily. 12/15/21   [provider]  midodrine (PROAMATINE) 5 MG tablet Take 5 mg by mouth 3 (three) times daily. 01/10/22   [provider]  Multiple Vitamin (MULTIVITAMIN WITH MINERALS) TABS tablet Take 1 tablet by mouth daily.    [provider]  omeprazole (PRILOSEC) 40 MG capsule Take 40 mg by mouth daily.    [provider]  rOPINIRole (REQUIP) 3 MG tablet Take 3 mg by mouth at bedtime. Take 1 tab 1 to 3 hours before bedtime    [provider]  torsemide (DEMADEX) 100 MG tablet Take 100 mg by mouth daily. 12/12/21   [provider]  traZODone (DESYREL) 50 MG tablet 50 mg. Take 1-2 tabs daily at bedtime    [provider]      Allergies    Bee venom and Other    Review of Systems   Review of Systems  Neurological:  Positive for weakness.  All  other systems reviewed and are negative.  Physical Exam Updated Vital Signs BP 107/63    Pulse 81    Temp 98.2 F (36.8 C) (Oral)    Resp (!) 22    Ht 5\' 2"  (1.575 m)    Wt 57.6 kg    SpO2 99%    BMI 23.23 kg/m  Physical Exam Vitals and nursing note reviewed.  Constitutional:      Appearance: She is well-developed.  HENT:     Head: Normocephalic and atraumatic.  Cardiovascular:     Rate and Rhythm: Normal rate and regular rhythm.     Heart sounds: No murmur heard. Pulmonary:     Effort: Pulmonary effort is normal. No respiratory distress.     Breath sounds: Normal breath sounds.     Comments: There is a Vas-Cath in the right  upper chest wall, dressing in place is clean, dry, intact. Abdominal:     Palpations: Abdomen is soft.     Tenderness: There is no abdominal tenderness. There is no guarding or rebound.     Comments: PD catheter in place, dressing is clean, dry, intact  Musculoskeletal:        General: No swelling or tenderness.  Skin:    General: Skin is warm and dry.  Neurological:     Mental Status: She is alert and oriented to person, place, and time.     Comments: No asymmetry of facial movements.  4-5 strength of the right upper extremity.  5 out of 5 strength in the left upper extremity, bilateral lower extremities.  Sensation to light touch intact in all 4 extremities.  Psychiatric:        Behavior: Behavior normal.    ED Results / Procedures / Treatments   Labs (all labs ordered are listed, but only abnormal results are displayed) Labs Reviewed  CBC - Abnormal; Notable for the following components:      Result Value   RBC 2.81 (*)    Hemoglobin 8.8 (*)    HCT 28.3 (*)    MCV 100.7 (*)    All other components within normal limits  COMPREHENSIVE METABOLIC PANEL - Abnormal; Notable for the following components:   Glucose, Bld 107 (*)    Creatinine, Ser 4.00 (*)    Calcium 8.8 (*)    Total Protein 6.2 (*)    Albumin 3.0 (*)    GFR, Estimated 11 (*)    All other components within normal limits  I-STAT CHEM 8, ED - Abnormal; Notable for the following components:   Creatinine, Ser 4.50 (*)    Glucose, Bld 106 (*)    Calcium, Ion 1.09 (*)    Hemoglobin 8.8 (*)    HCT 26.0 (*)    All other components within normal limits  CBG MONITORING, ED - Abnormal; Notable for the following components:   Glucose-Capillary 103 (*)    All other components within normal limits  RESP PANEL BY RT-PCR (FLU A&B, COVID) ARPGX2  PROTIME-INR  APTT  DIFFERENTIAL  HEMOGLOBIN A1C  LIPID PANEL    EKG EKG Interpretation  Date/Time:  Tuesday January 24 2022 13:37:07 EST Ventricular Rate:  90 PR  Interval:  134 QRS Duration: 78 QT Interval:  360 QTC Calculation: 440 R Axis:   118 Text Interpretation: Sinus rhythm with Premature atrial complexes Low voltage QRS Left posterior fascicular block Cannot rule out Anterior infarct , age undetermined Abnormal ECG No previous ECGs available Confirmed by Quintella Reichert [phone]) on 01/24/2022 5:14:42 PM  Radiology MR BRAIN WO CONTRAST  Result Date: 01/24/2022 CLINICAL DATA:  Loss of dexterity in right hand EXAM: MRI HEAD WITHOUT CONTRAST TECHNIQUE: Multiplanar, multiecho pulse sequences of the brain and surrounding structures were obtained without intravenous contrast. COMPARISON:  None. FINDINGS: Brain: There are scattered infarcts in both cerebral and cerebellar hemispheres, some of which appear acute while others appear more subacute. The largest infarct is in the left of the frontal lobe with involvement precentral gyrus and likely accounts for the patient's symptoms. Additional acute to subacute infarcts are seen in the right superior frontal gyrus, bilateral temporoparietal periventricular white matter and right occipital lobe. There is no significant hemorrhage associated with these infarcts. There is no acute extra-axial fluid collection. There is a background of mild global parenchymal volume loss with prominence of the ventricular system and extra-axial CSF spaces. There is extensive confluent FLAIR signal abnormality throughout the subcortical and periventricular white matter likely reflecting sequela of advanced chronic white matter microangiopathy. There is an additional remote infarct in the left cerebellar hemisphere and small remote infarct in the left corona radiata. There are extensive punctate chronic microhemorrhages throughout the cerebral and cerebellar hemispheres which are predominantly peripheral in distribution. There is no mass lesion.  There is no midline shift. Vascular: Normal flow voids. Skull and upper cervical spine: Normal  marrow signal. Sinuses/Orbits: The paranasal sinuses are clear. Bilateral lens implants are in place. The globes and orbits are otherwise unremarkable. Other: There is a left mastoid effusion. IMPRESSION: 1. Scattered infarcts in the bilateral cerebral hemispheres as detailed above, some of which appear acute while others appear more subacute. The largest infarct is in the left frontal lobe with involvement of the precentral gyrus, likely accounting for the patient's reported symptoms. Consider embolic etiology given involvement of multiple vascular territories. 2. Background of mild global parenchymal volume loss and advanced chronic white matter microangiopathy. Small remote infarcts in the left corona radiata and left cerebellar hemisphere. 3. Extensive punctate chronic microhemorrhages throughout the cerebral and cerebellar hemispheres predominantly peripheral in distribution concerning for cerebral amyloid angiopathy. Electronically Signed   By: Valetta Mole M.D.   On: 01/24/2022 16:41   MR Cervical Spine Wo Contrast  Result Date: 01/24/2022 CLINICAL DATA:  Loss of dexterity in right hand starting 01/09/2022 EXAM: MRI CERVICAL SPINE WITHOUT CONTRAST TECHNIQUE: Multiplanar, multisequence MR imaging of the cervical spine was performed. No intravenous contrast was administered. COMPARISON:  None. FINDINGS: There is intermittent motion degradation. The sagittal T1 images are mildly motion degraded, and the axial images are moderately motion degraded. Within this confine: Alignment: Normal. Vertebrae: Vertebral body heights are preserved. There is no suspicious marrow signal abnormality or marrow edema. Cord: Normal in signal and morphology, within the confines of motion degraded images. Posterior Fossa, vertebral arteries, paraspinal tissues: The posterior fossa is assessed on the separately dictated brain MRI. The vertebral artery flow voids are present. The paraspinal soft tissues are unremarkable. Disc  levels: The disc heights are overall preserved. C2-C3: No significant spinal canal or neural foraminal stenosis C3-C4: No significant spinal canal or neural foraminal stenosis C4-C5: There is left worse than right facet arthropathy with prominent left-sided osteophytosis/ligamentum flavum thickening (9-12). Findings result in mild spinal canal stenosis with slight rightward displacement of the cervical cord but no evidence of frank cord compression or signal abnormality. There is mild left and no significant right neural foraminal stenosis. C5-C6: Mild uncovertebral and facet arthropathy resulting in mild left and no significant right neural foraminal stenosis and no significant  spinal canal stenosis. C6-C7: No significant spinal canal or neural foraminal stenosis. C7-T1: No significant spinal canal or neural foraminal stenosis. IMPRESSION: 1. Left worse than right facet arthropathy with prominent left osteophytosis/ligamentum flavum thickening resulting in mild spinal canal stenosis with slight rightward displacement of the cord but no frank cord compression or definite cord signal abnormality. 2. Otherwise, minimal for age degenerative changes in the cervical spine without other high-grade spinal canal or neural foraminal stenosis. Electronically Signed   By: Valetta Mole M.D.   On: 01/24/2022 16:18    Procedures Procedures    Medications Ordered in ED Medications  sodium chloride flush (NS) 0.9 % injection 3 mL (3 mLs Intravenous Not Given 01/24/22 1835)  LORazepam (ATIVAN) tablet 0.5 mg (0.5 mg Oral Not Given 01/24/22 1835)  atorvastatin (LIPITOR) tablet 40 mg (has no administration in time range)  EPINEPHrine (EPI-PEN) injection 0.3 mg (has no administration in time range)  methimazole (TAPAZOLE) tablet 5 mg (has no administration in time range)  traZODone (DESYREL) tablet 50 mg (50 mg Oral Given 01/24/22 2214)  pantoprazole (PROTONIX) EC tablet 40 mg (has no administration in time range)   rOPINIRole (REQUIP) tablet 3 mg (3 mg Oral Given 01/24/22 2214)  clonazePAM (KLONOPIN) tablet 0.5 mg (has no administration in time range)   stroke: mapping our early stages of recovery book (has no administration in time range)  acetaminophen (TYLENOL) tablet 650 mg (has no administration in time range)    Or  acetaminophen (TYLENOL) 160 MG/5ML solution 650 mg (has no administration in time range)    Or  acetaminophen (TYLENOL) suppository 650 mg (has no administration in time range)  senna-docusate (Senokot-S) tablet 1 tablet (has no administration in time range)  aspirin suppository 300 mg ( Rectal See Alternative 01/24/22 2214)    Or  aspirin tablet 325 mg (325 mg Oral Given 01/24/22 2214)  heparin injection 5,000 Units (5,000 Units Subcutaneous Given 01/24/22 2215)  insulin aspart (novoLOG) injection 0-6 Units (has no administration in time range)    ED Course/ Medical Decision Making/ A&P                           Medical Decision Making Risk Decision regarding hospitalization.   Patient with history of ESRD on hemodialysis here for evaluation of 2 weeks of right upper extremity weakness.  She does have focal weakness on examination.  Labs with stable renal insufficiency, CBC with stable anemia.  MRI of head is concerning for multiple infarcts, acute to subacute.  There is concern for possible embolic origin given the distribution and number of her infarcts.  Discussed with patient and daughter findings of studies and recommendation for admission.  Patient is in agreement with admission.  Hospitalist consulted for admission.  Dr. Lorrin Goodell with Neurology consulted regarding subacute CVA.        Final Clinical Impression(s) / ED Diagnoses Final diagnoses:  Acute CVA (cerebrovascular accident) [facility])    Rx / [place] Orders ED Discharge Orders     None         Quintella Reichert, MD 01/24/22 2251

## 2022-01-24 NOTE — ED Notes (Signed)
Called MRI for confirmation. MRI will bring patient to room once completed. RN is aware.

## 2022-01-24 NOTE — ED Provider Triage Note (Signed)
Emergency Medicine Provider Triage Evaluation Note  Monica Hunt , a 81 y.o. female  was evaluated in triage.  Patient was sent to the emergency department from [place] [facility] at [facility], known stroke center, for evaluation of loss of dexterity in her right hand that she noticed the evening of 1/16.  Review of Systems  Positive: Right hand weakness Negative: Numbness, tingling, slurred speech  Physical Exam  BP 139/79 (BP Location: Right Arm)    Pulse 94    Temp 98.2 F (36.8 C) (Oral)    Resp 16    SpO2 100%  Gen:   Awake, no distress   Resp:  Normal effort  MSK:   Moves extremities without difficulty  Other:  No pronator drift, no slurred speech, no facial droop, sensation intact in all extremities, 5/5 grip strength in bilateral UE  Medical Decision Making  Medically screening exam initiated at 1:48 PM.  Appropriate orders placed.  Mauricio Po was informed that the remainder of the evaluation will be completed by another provider, this initial triage assessment does not replace that evaluation, and the importance of remaining in the ED until their evaluation is complete.  Neurologist from [place] [facility] suggested that patient obtain imaging of her brain and neck to rule out acute ischemic event or cervical radiculopathy   Nyashia Raney T, PA-C 01/24/22 1400

## 2022-01-24 NOTE — ED Triage Notes (Signed)
Patient sent to [facility] from [facility] evaluation for a loss of dexterity in her right hand that she noticed the evening of 01/09/2022. No arm drift, no dysarthria, no facial droop, sensation equal bilaterally. Patient is alert, oriented, and in no apparent distress at this time.

## 2022-01-24 NOTE — ED Notes (Signed)
Hospitalist notified of pt passing swallow screen. Pt currently eating a sandwich, some fruit, and applesauce

## 2022-01-25 ENCOUNTER — Encounter (HOSPITAL_COMMUNITY)
Admission: EM | Disposition: A | Discharge status: Home / Self Care | Payer: Self-pay | Source: Home / Self Care | Attending: Family Medicine

## 2022-01-25 ENCOUNTER — Observation Stay (HOSPITAL_COMMUNITY): Payer: Medicare Other

## 2022-01-25 ENCOUNTER — Encounter (HOSPITAL_COMMUNITY): Payer: Self-pay | Admitting: Internal Medicine

## 2022-01-25 DIAGNOSIS — I63413 Cerebral infarction due to embolism of bilateral middle cerebral arteries: Secondary | ICD-10-CM | POA: Diagnosis present

## 2022-01-25 DIAGNOSIS — F419 Anxiety disorder, unspecified: Secondary | ICD-10-CM | POA: Diagnosis not present

## 2022-01-25 DIAGNOSIS — R29701 NIHSS score 1: Secondary | ICD-10-CM | POA: Diagnosis present

## 2022-01-25 DIAGNOSIS — Z992 Dependence on renal dialysis: Secondary | ICD-10-CM | POA: Diagnosis not present

## 2022-01-25 DIAGNOSIS — I639 Cerebral infarction, unspecified: Secondary | ICD-10-CM

## 2022-01-25 DIAGNOSIS — D631 Anemia in chronic kidney disease: Secondary | ICD-10-CM | POA: Diagnosis present

## 2022-01-25 DIAGNOSIS — I680 Cerebral amyloid angiopathy: Secondary | ICD-10-CM | POA: Diagnosis present

## 2022-01-25 DIAGNOSIS — I10 Essential (primary) hypertension: Secondary | ICD-10-CM | POA: Diagnosis not present

## 2022-01-25 DIAGNOSIS — F17210 Nicotine dependence, cigarettes, uncomplicated: Secondary | ICD-10-CM | POA: Diagnosis present

## 2022-01-25 DIAGNOSIS — F32A Depression, unspecified: Secondary | ICD-10-CM | POA: Diagnosis present

## 2022-01-25 DIAGNOSIS — E0590 Thyrotoxicosis, unspecified without thyrotoxic crisis or storm: Secondary | ICD-10-CM | POA: Diagnosis present

## 2022-01-25 DIAGNOSIS — I6612 Occlusion and stenosis of left anterior cerebral artery: Secondary | ICD-10-CM | POA: Diagnosis not present

## 2022-01-25 DIAGNOSIS — M898X9 Other specified disorders of bone, unspecified site: Secondary | ICD-10-CM | POA: Diagnosis present

## 2022-01-25 DIAGNOSIS — Z7984 Long term (current) use of oral hypoglycemic drugs: Secondary | ICD-10-CM | POA: Diagnosis not present

## 2022-01-25 DIAGNOSIS — I6389 Other cerebral infarction: Secondary | ICD-10-CM | POA: Diagnosis not present

## 2022-01-25 DIAGNOSIS — I6523 Occlusion and stenosis of bilateral carotid arteries: Secondary | ICD-10-CM | POA: Diagnosis not present

## 2022-01-25 DIAGNOSIS — E1122 Type 2 diabetes mellitus with diabetic chronic kidney disease: Secondary | ICD-10-CM | POA: Diagnosis not present

## 2022-01-25 DIAGNOSIS — Z91030 Bee allergy status: Secondary | ICD-10-CM | POA: Diagnosis not present

## 2022-01-25 DIAGNOSIS — G2581 Restless legs syndrome: Secondary | ICD-10-CM | POA: Diagnosis present

## 2022-01-25 DIAGNOSIS — G8194 Hemiplegia, unspecified affecting left nondominant side: Secondary | ICD-10-CM | POA: Diagnosis present

## 2022-01-25 DIAGNOSIS — E1142 Type 2 diabetes mellitus with diabetic polyneuropathy: Secondary | ICD-10-CM | POA: Diagnosis present

## 2022-01-25 DIAGNOSIS — Z20822 Contact with and (suspected) exposure to covid-19: Secondary | ICD-10-CM | POA: Diagnosis present

## 2022-01-25 DIAGNOSIS — I120 Hypertensive chronic kidney disease with stage 5 chronic kidney disease or end stage renal disease: Secondary | ICD-10-CM | POA: Diagnosis present

## 2022-01-25 DIAGNOSIS — E039 Hypothyroidism, unspecified: Secondary | ICD-10-CM | POA: Diagnosis present

## 2022-01-25 DIAGNOSIS — N186 End stage renal disease: Secondary | ICD-10-CM | POA: Diagnosis not present

## 2022-01-25 DIAGNOSIS — Z8673 Personal history of transient ischemic attack (TIA), and cerebral infarction without residual deficits: Secondary | ICD-10-CM | POA: Diagnosis not present

## 2022-01-25 DIAGNOSIS — I6623 Occlusion and stenosis of bilateral posterior cerebral arteries: Secondary | ICD-10-CM | POA: Diagnosis not present

## 2022-01-25 DIAGNOSIS — Z79899 Other long term (current) drug therapy: Secondary | ICD-10-CM | POA: Diagnosis not present

## 2022-01-25 DIAGNOSIS — K219 Gastro-esophageal reflux disease without esophagitis: Secondary | ICD-10-CM | POA: Diagnosis present

## 2022-01-25 DIAGNOSIS — E785 Hyperlipidemia, unspecified: Secondary | ICD-10-CM | POA: Diagnosis present

## 2022-01-25 DIAGNOSIS — R482 Apraxia: Secondary | ICD-10-CM | POA: Diagnosis present

## 2022-01-25 HISTORY — PX: LOOP RECORDER INSERTION: EP1214

## 2022-01-25 LAB — ECHOCARDIOGRAM COMPLETE
AR max vel: 1.2 cm2
AV Area VTI: 1.07 cm2
AV Area mean vel: 1.16 cm2
AV Mean grad: 8 mmHg
AV Peak grad: 14.6 mmHg
Ao pk vel: 1.91 m/s
Area-P 1/2: 3.72 cm2
Calc EF: 65.8 %
Height: 62 in
MV VTI: 0.25 cm2
S' Lateral: 2.4 cm
Single Plane A2C EF: 70.9 %
Single Plane A4C EF: 61.6 %
Weight: 2032 oz

## 2022-01-25 LAB — GLUCOSE, CAPILLARY
Glucose-Capillary: 107 mg/dL — ABNORMAL HIGH (ref 70–99)
Glucose-Capillary: 117 mg/dL — ABNORMAL HIGH (ref 70–99)
Glucose-Capillary: 131 mg/dL — ABNORMAL HIGH (ref 70–99)

## 2022-01-25 LAB — LIPID PANEL
Cholesterol: 126 mg/dL (ref 0–200)
HDL: 30 mg/dL — ABNORMAL LOW (ref >40)
LDL Cholesterol: 53 mg/dL (ref 0–99)
Total CHOL/HDL Ratio: 4.2 RATIO
Triglycerides: 217 mg/dL — ABNORMAL HIGH (ref <150)
VLDL: 43 mg/dL — ABNORMAL HIGH (ref 0–40)

## 2022-01-25 LAB — HEMOGLOBIN A1C
Hgb A1c MFr Bld: 4.9 % (ref 4.8–5.6)
Mean Plasma Glucose: 93.93 mg/dL

## 2022-01-25 LAB — CBG MONITORING, ED: Glucose-Capillary: 98 mg/dL (ref 70–99)

## 2022-01-25 IMAGING — MR MR MRA HEAD W/O CM
1 series · 19 of 48 positions shown · non-contrast
Comparison: Brain MRI [DATE].

CLINICAL DATA: 80-year-old female with several scattered acute
infarcts on brain MRI yesterday, evidence of amyloid angiopathy.

EXAM:
MRA HEAD WITHOUT CONTRAST
TECHNIQUE: Angiographic images of the Circle of Willis were acquired using MRA
technique without intravenous contrast.

[Series 13: 3d cow · axial · 0.5mm · 0.41mm/px · z∈[-82,-1]mm · 19 of 172 slices shown]
[im 1/172]
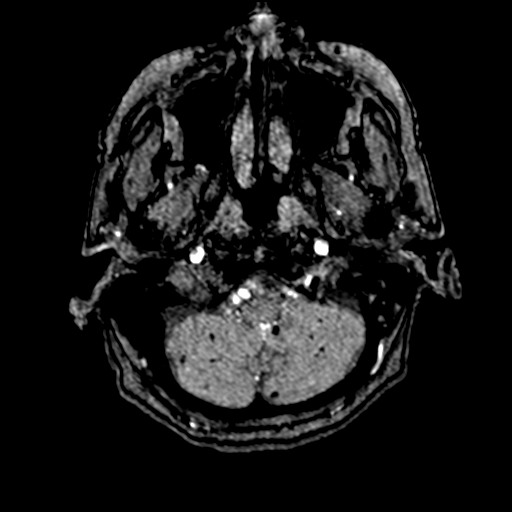
[im 4/172]
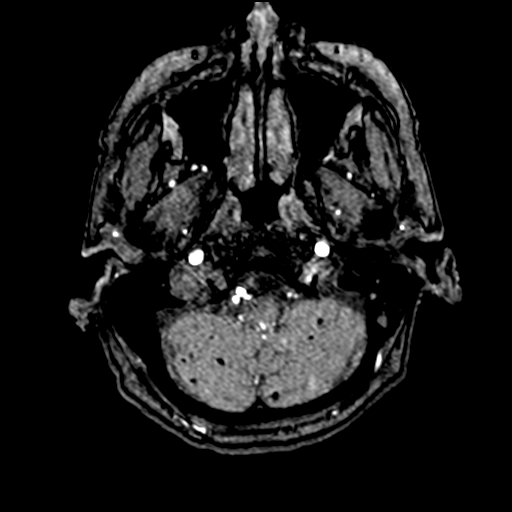
[im 8/172]
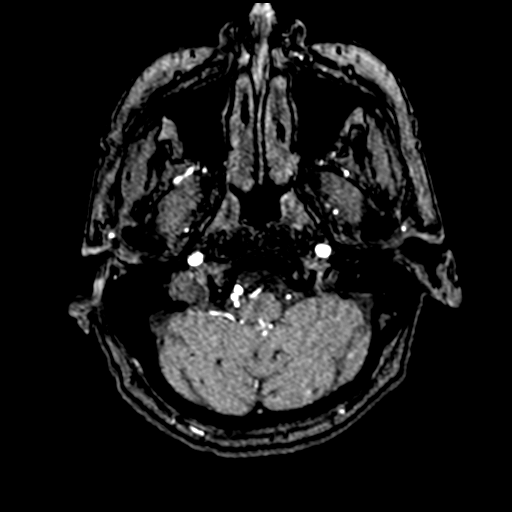
[im 11/172]
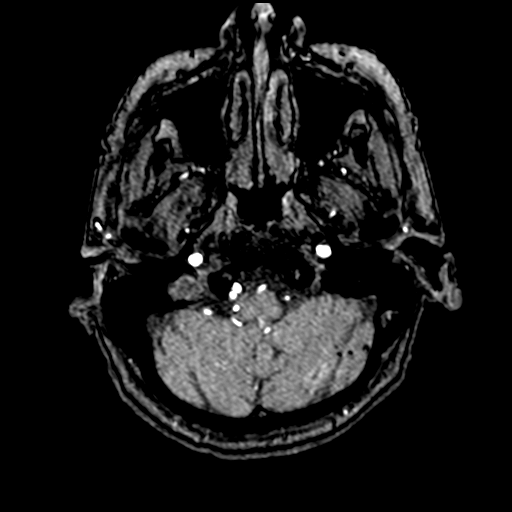
[im 15/172]
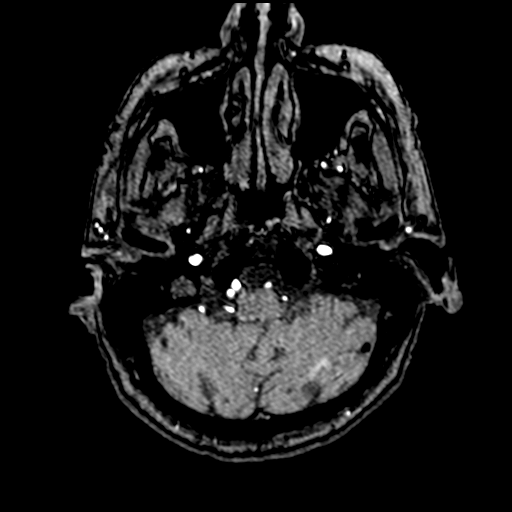
[im 19/172]
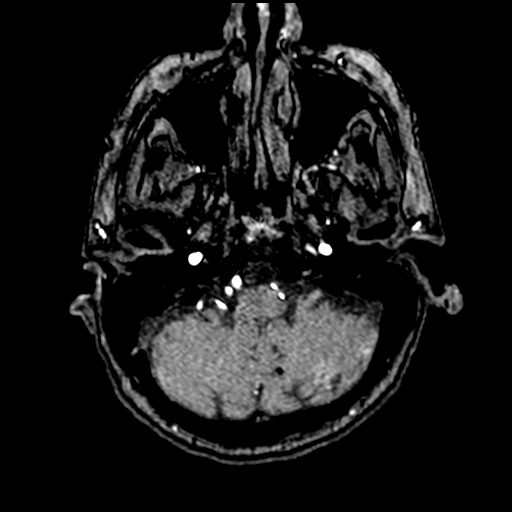
[im 22/172]
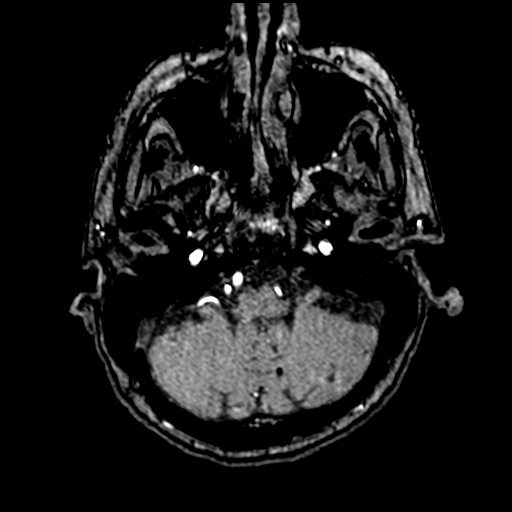
[im 26/172]
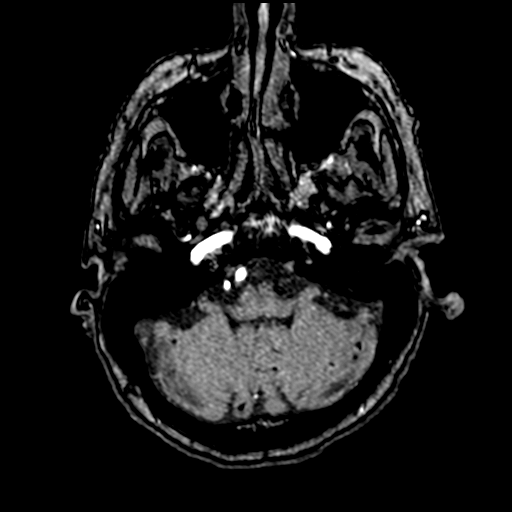
[im 30/172]
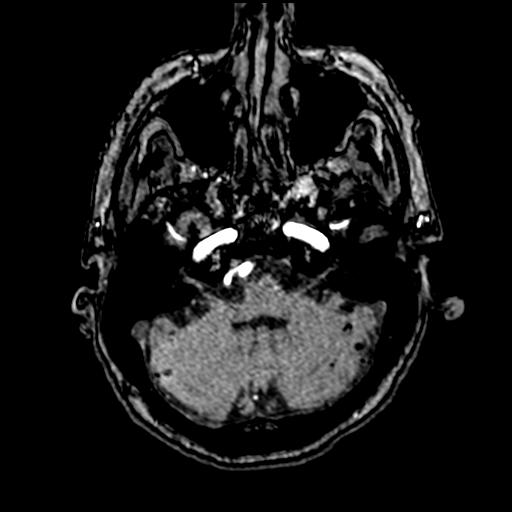
[im 33/172]
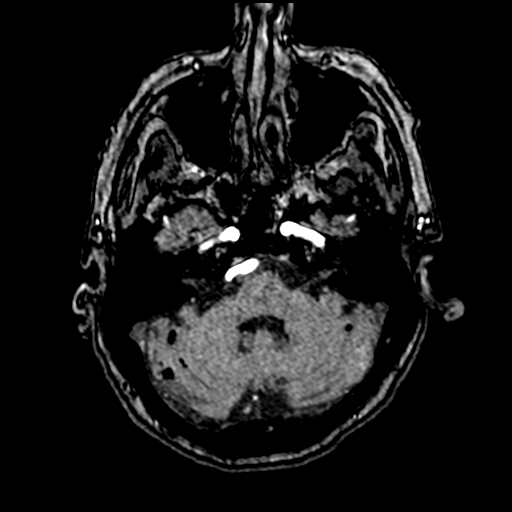
[im 37/172]
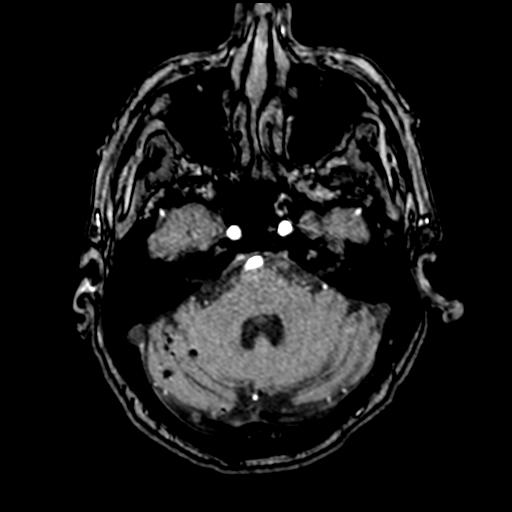
[im 55/172]
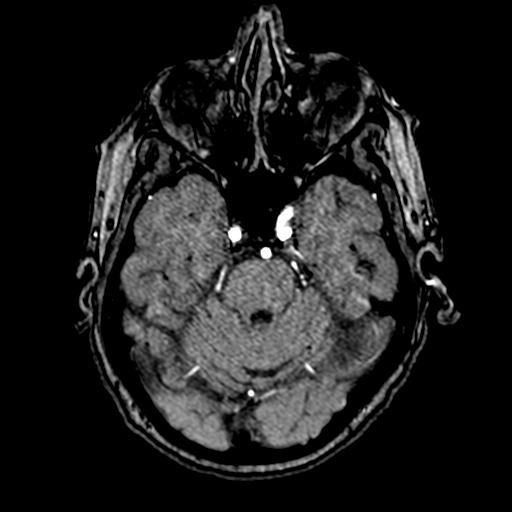
[im 77/172]
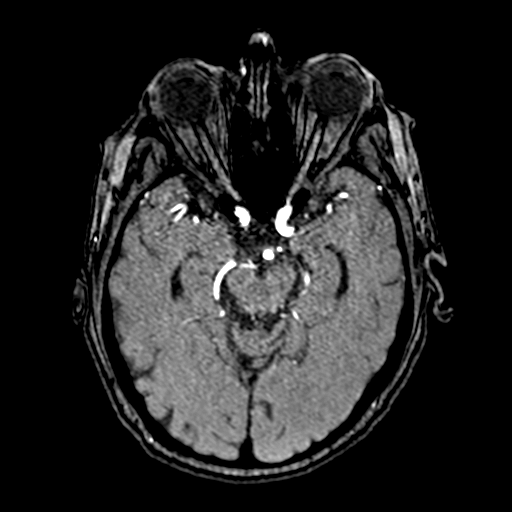
[im 88/172]
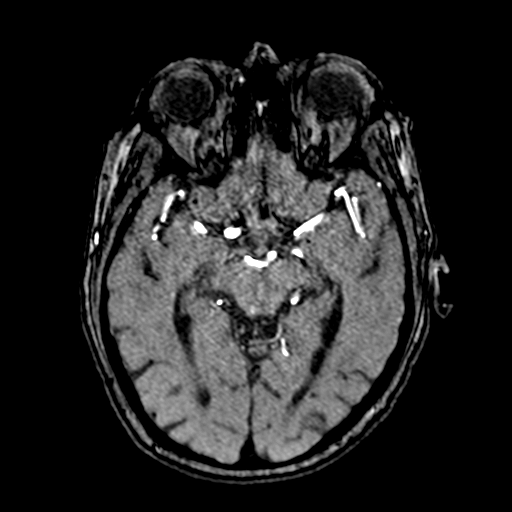
[im 99/172]
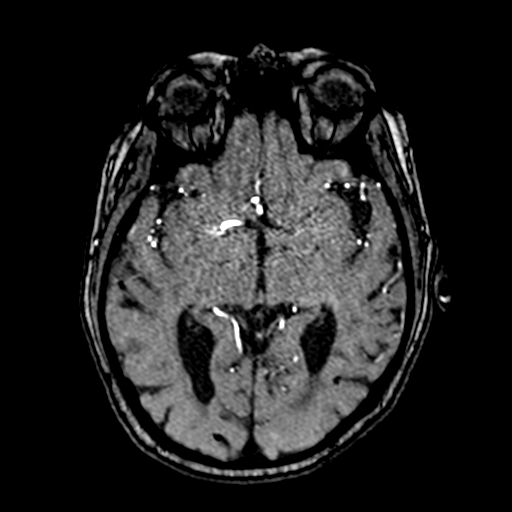
[im 121/172]
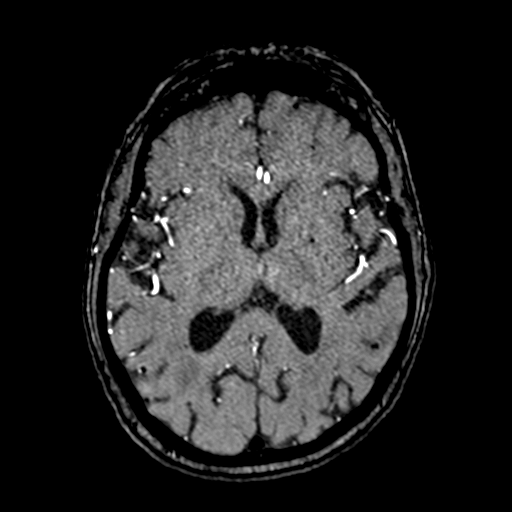
[im 142/172]
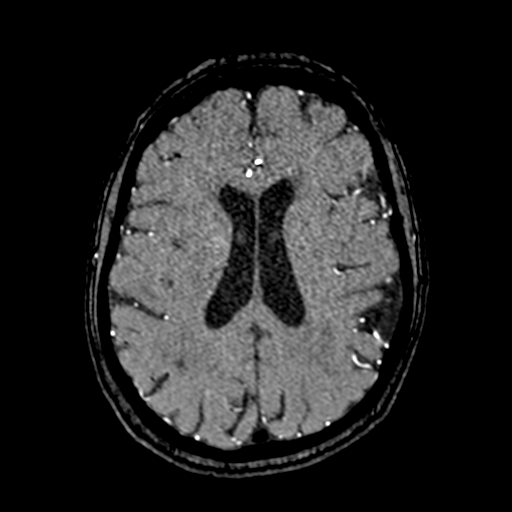
[im 146/172]
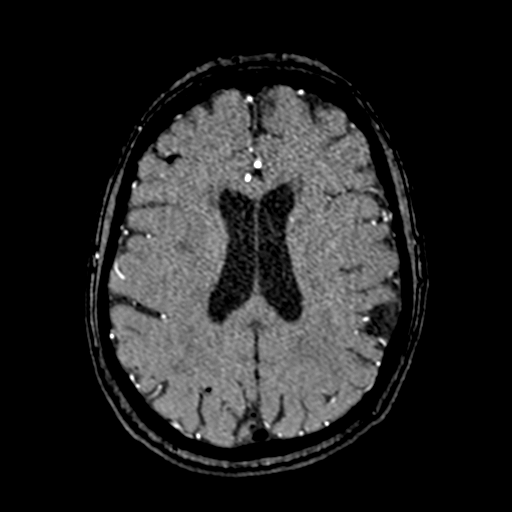
[im 164/172]
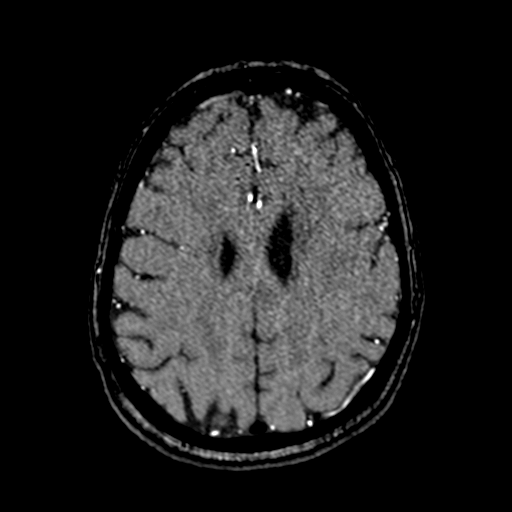

[19 of 48 positions shown; findings below may reference images not displayed]

FINDINGS: Anterior circulation: Antegrade flow in both ICA siphons with mild
ICA siphon irregularity and dolichoectasia. Mild stenosis at the
right anterior genu. Normal ophthalmic artery origins. Patent
carotid termini. Patent MCA and ACA origins. There is a mild
irregularity and stenosis in the left ACA distal A2 segment. Other
visible ACA branches are within normal limits. MCA M1 segments and
bifurcations are patent without stenosis. No MCA branch occlusion
identified. No significant MCA branch stenosis.

Posterior circulation: Antegrade flow in the posterior circulation
with mildly dominant distal left vertebral artery. No distal
vertebral stenosis. Both PICA origins are patent. Patent
vertebrobasilar junction and basilar artery without stenosis. Patent
basilar tip, SCA and PCA origins. Posterior communicating arteries
are diminutive or absent. No PCA branch occlusion is identified, but
there is moderate irregularity of the bilateral P3 branches, with
associated stenosis greater on the left side.

Anatomic variants: Mildly dominant left vertebral artery.

Other: Widespread susceptibility artifact related to amyloid
angiopathy.
IMPRESSION: 1. No circle-of-Willis branch occlusion identified.
2. Intracranial atherosclerosis with moderate irregularity and
stenosis of the PCA P3 segments, greater on the Left.
Mild stenosis in the anterior circulation at the right ICA anterior
genu and distal left ACA A2.
3. Susceptibility artifact related to Amyloid Angiopathy.

## 2022-01-25 SURGERY — LOOP RECORDER INSERTION

## 2022-01-25 MED ORDER — LIDOCAINE-EPINEPHRINE 1 %-1:100000 IJ SOLN
INTRAMUSCULAR | Status: AC
  Filled 2022-01-25: qty 1

## 2022-01-25 MED ORDER — CHLORHEXIDINE GLUCONATE CLOTH 2 % EX PADS
6.0000 | MEDICATED_PAD | Freq: Every day | CUTANEOUS | Status: DC
  Administered 2022-01-26: 6 via TOPICAL

## 2022-01-25 MED ORDER — LIDOCAINE-EPINEPHRINE 1 %-1:100000 IJ SOLN
INTRAMUSCULAR | Status: DC | PRN
  Administered 2022-01-25: 10 mL

## 2022-01-25 SURGICAL SUPPLY — 2 items
PACK LOOP INSERTION (CUSTOM PROCEDURE TRAY) ×3 IMPLANT
SYSTEM MONITOR REVEAL LINQ II (Prosthesis & Implant Heart) ×1 IMPLANT

## 2022-01-25 NOTE — Hospital Course (Signed)
Monica Hunt is a 81 y.o. female with a history of ESRD recently changed from PD to HD MWF, HTN, T2DM, depression, anxiety, HLD, GERD, RLS, hypothyroidism who presented to the ED on the advice of neurology on 1/31 due to 2-3 weeks of right hand weakness. She noticed right (dominant) hand weakness/loss of dexterity several weeks ago which was debilitating enough to have a right subclavian HD catheter placed to transition from PD to HD. She was evaluated by neurology who referred her to [facility] for stroke evaluation. Brain MRI showed scattered infarcts of varying chronicity within the bilateral MCA distributions. There was microhemorrhages concerning for cerebral amyloid angiopathy. She was admitted for full stroke work up which included implantable loop recorder.

## 2022-01-25 NOTE — ED Notes (Signed)
Patient transported to MRI 

## 2022-01-25 NOTE — Progress Notes (Signed)
PT Cancellation Note  Patient Details Name: Monica Hunt MRN: [id number] DOB: Sep 15, 1941   Cancelled Treatment:    Reason Eval/Treat Not Completed: PT screened, no needs identified, will sign off Per OT, pt with no skilled PT needs at this time. If needs change, please re-consult.   Reuel Derby, PT, DPT  Acute Rehabilitation Services  Pager: [phone] Office: [phone]  Rudean Hitt 01/25/2022, 10:06 AM

## 2022-01-25 NOTE — Progress Notes (Signed)
Carotid duplex has been completed.   Preliminary results in CV Proc.   Monica Hunt 01/25/2022 9:29 AM

## 2022-01-25 NOTE — Plan of Care (Signed)

## 2022-01-25 NOTE — Assessment & Plan Note (Addendum)
Continue statin. LDL at 53 is at goal.

## 2022-01-25 NOTE — Assessment & Plan Note (Signed)
HbA1c 4.9%. Continue SSI. Will consider holding [facility] at discharge if pt has had hypoglycemic events.

## 2022-01-25 NOTE — Consult Note (Addendum)
NEUROLOGY CONSULTATION NOTE   Date of service: January 25, 2022 Patient Name: Monica Hunt MRN:  [id number] DOB:  September 28, 1941 Reason for consult: "embolic strokes" Requesting Provider: Marcelyn Bruins, MD _ _ _   _ __   _ __ _ _  __ __   _ __   __ _  History of Present Illness  Monica Hunt is a 81 y.o. female with PMH significant for cataract, depression, diabetes, hypertension, GERD, restless leg syndrome, hypothyroidism who presents with right hand weakness.  Patient reports incoordination in her right hand that started about 2 weeks ago.  She saw her PCP and was referred to ER for concern for possible stroke.  She had workup with MRI Brain which demonstrated multiple embolic appearing infarcts along with microhemorrhages with the distribution concerning for cerebral amyloid angiopathy.  She reports recently transitioned from peritoneal dialysis to hemodialysis and had a catheter placed. She transitioned 2/2 poor incoordination of her R hand.  She also has some trouble with words that she feels is new but is not sure when it started.  LKW: 01/08/22 mRS: 0 tNKASEThrombectomy: not offered, outside the window NIHSS components Score: Comment  1a Level of Conscious 0[x]  1[]  2[]  3[]      1b LOC Questions 0[x]  1[]  2[]       1c LOC Commands 0[x]  1[]  2[]       2 Best Gaze 0[x]  1[]  2[]       3 Visual 0[x]  1[]  2[]  3[]      4 Facial Palsy 0[x]  1[]  2[]  3[]      5a Motor Arm - left 0[x]  1[]  2[]  3[]  4[]  UN[]    5b Motor Arm - Right 0[x]  1[]  2[]  3[]  4[]  UN[]    6a Motor Leg - Left 0[x]  1[]  2[]  3[]  4[]  UN[]    6b Motor Leg - Right 0[x]  1[]  2[]  3[]  4[]  UN[]    7 Limb Ataxia 0[x]  1[]  2[]  3[]  UN[]     8 Sensory 0[x]  1[]  2[]  UN[]      9 Best Language 0[x]  1[]  2[]  3[]      10 Dysarthria 0[x]  1[]  2[]  UN[]      11 Extinct. and Inattention 0[x]  1[]  2[]       TOTAL: 0     ROS   Constitutional Denies weight loss, fever and chills.   HEENT Denies changes in vision and hearing.   Respiratory Denies SOB and  cough.   CV Denies palpitations and CP   GI Denies abdominal pain, nausea, vomiting and diarrhea.   GU Denies dysuria and urinary frequency.   MSK Denies myalgia and joint pain.   Skin Denies rash and pruritus.   Neurological Denies headache and syncope.   Psychiatric Denies recent changes in mood. Denies anxiety and depression.    Past History   Past Medical History:  Diagnosis Date   Cataracts, bilateral    Depression    Diabetes mellitus without complication ([place])    Hypertension    Past Surgical History:  Procedure Laterality Date   ABDOMINAL HYSTERECTOMY     vaginal   CARPAL TUNNEL RELEASE Right    COLONOSCOPY WITH PROPOFOL N/A 11/17/2014   Procedure: COLONOSCOPY WITH PROPOFOL;  Surgeon: Garlan Fair, MD;  Location: [facility];  Service: Endoscopy;  Laterality: N/A;   Family History  Problem Relation Age of Onset   Kidney cancer Mother    Lung cancer Father    Social History   Socioeconomic History   Marital status: Divorced    Spouse name: Not on file  Number of children: Not on file   Years of education: Not on file   Highest education level: Not on file  Occupational History   Not on file  Tobacco Use   Smoking status: Every Day    Packs/day: 0.50    Years: 45.00    Pack years: 22.50    Types: Cigarettes   Smokeless tobacco: Never  Vaping Use   Vaping Use: Never used  Substance and Sexual Activity   Alcohol use: No   Drug use: No   Sexual activity: Not on file  Other Topics Concern   Not on file  Social History Narrative   Not on file   Social Determinants of Health   Financial Resource Strain: Not on file  Food Insecurity: Not on file  Transportation Needs: Not on file  Physical Activity: Not on file  Stress: Not on file  Social Connections: Not on file   Allergies  Allergen Reactions   Bee Venom Anaphylaxis   Other Anaphylaxis    Wasp sting    Medications  (Not in a hospital admission)    Vitals   Vitals:   01/24/22  1815 01/24/22 1845 01/24/22 2116 01/24/22 2245  BP: (!) 111/100 111/71 107/63 109/70  Pulse: 86 83 81 88  Resp: (!) 22 15 (!) 22 18  Temp:      TempSrc:      SpO2: 100% 100% 99% 99%  Weight:      Height:         Body mass index is 23.23 kg/m.  Physical Exam   General: Laying comfortably in bed; in no acute distress.  HENT: Normal oropharynx and mucosa. Normal external appearance of ears and nose.  Neck: Supple, no pain or tenderness  CV: No JVD. No peripheral edema.  Pulmonary: Symmetric Chest rise. Normal respiratory effort.  Abdomen: Soft to touch, non-tender.  Ext: No cyanosis, edema, or deformity  Skin: No rash. Normal palpation of skin.   Musculoskeletal: Normal digits and nails by inspection. No clubbing.   Neurologic Examination  Mental status/Cognition: Alert, oriented to self, place, month and year, good attention.  Speech/language: Fluent, comprehension intact, object naming intact, repetition intact. Just feels like she has to think hard to find the right words. Cranial nerves:   CN II Pupils equal and reactive to light, no VF deficits    CN III,IV,VI EOM intact, no gaze preference or deviation, no nystagmus    CN V normal sensation in V1, V2, and V3 segments bilaterally   CN VII no asymmetry, no nasolabial fold flattening    CN VIII normal hearing to speech    CN IX & X normal palatal elevation, no uvular deviation    CN XI 5/5 head turn and 5/5 shoulder shrug bilaterally    CN XII midline tongue protrusion    Motor:  Muscle bulk: normal, tone normal, pronator drift yes RUE drift. tremor None Mvmt Root Nerve  Muscle Right Left Comments  SA C5/6 Ax Deltoid 5 5   EF C5/6 Mc Biceps 5 5   EE C6/7/8 Rad Triceps 5 5   WF C6/7 Med FCR     WE C7/8 PIN ECU     F Ab C8/T1 U ADM/FDI 2 5   HF L1/2/3 Fem Illopsoas 5 5   KE L2/3/4 Fem Quad 5 5   DF L4/5 D Peron Tib Ant 5 5   PF S1/2 Tibial Grc/Sol 5 5    Reflexes:  Right Left Comments  Pectoralis  Biceps  (C5/6) 2 2   Brachioradialis (C5/6) 2 2    Triceps (C6/7) 2 2    Patellar (L3/4) 2 2    Achilles (S1)      Hoffman      Plantar     Jaw jerk    Sensation:  Light touch Intact throughout   Pin prick    Temperature    Vibration   Proprioception    Coordination/Complex Motor:  - Finger to Nose intact on the left and has some trouble due to weakness in RUE but no obvious ataxia. - Heel to shin intact BL - Rapid alternating movement are normal - Gait: deferred for patient safety.  Labs   CBC:  Recent Labs  Lab 01/24/22 1405 01/24/22 1420  WBC 5.3  --   NEUTROABS 3.4  --   HGB 8.8* 8.8*  HCT 28.3* 26.0*  MCV 100.7*  --   PLT 164  --     Basic Metabolic Panel:  Lab Results  Component Value Date   NA 139 01/24/2022   K 4.5 01/24/2022   CO2 27 01/24/2022   GLUCOSE 106 (H) 01/24/2022   BUN 19 01/24/2022   CREATININE 4.50 (H) 01/24/2022   CALCIUM 8.8 (L) 01/24/2022   GFRNONAA 11 (L) 01/24/2022   Lipid Panel: No results found for: LDLCALC HgbA1c: No results found for: HGBA1C Urine Drug Screen: No results found for: LABOPIA, COCAINSCRNUR, LABBENZ, AMPHETMU, THCU, LABBARB  Alcohol Level No results found for: [facility]  MR Angio head without contrast and Carotid Duplex BL: pending  MRI Brain(Personally reviewed): 1. Scattered infarcts in the bilateral cerebral hemispheres as detailed above, some of which appear acute while others appear more subacute. The largest infarct is in the left frontal lobe with involvement of the precentral gyrus, likely accounting for the patient's reported symptoms. Consider embolic etiology given involvement of multiple vascular territories. 2. Background of mild global parenchymal volume loss and advanced chronic white matter microangiopathy. Small remote infarcts in the left corona radiata and left cerebellar hemisphere. 3. Extensive punctate chronic microhemorrhages throughout the cerebral and cerebellar hemispheres predominantly  peripheral in distribution concerning for cerebral amyloid angiopathy.  MRI Cervical spine without contrast: 1. Left worse than right facet arthropathy with prominent left osteophytosis/ligamentum flavum thickening resulting in mild spinal canal stenosis with slight rightward displacement of the cord but no frank cord compression or definite cord signal abnormality. 2. Otherwise, minimal for age degenerative changes in the cervical spine without other high-grade spinal canal or neural foraminal stenosis.  Impression   Monica Hunt is a 81 y.o. female presenting with R hand incoordination and difficulty finding words. Symptoms started about 2 weeks ago. Found to have embolic appearing infarcts on MRI. MRI also notable for scattered microhemorrhages conerning for potential cerebral amyloid angiopathy.  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   Secondary Diagnosis: Essential (primary) hypertension, Type 2 diabetes mellitus w/o complications, and ESRD  Recommendations  Plan:  - Frequent Neuro checks per stroke unit protocol - Recommend Vascular imaging with MRA Angio Head without contrast and US Carotid doppler - Recommend obtaining TTE - Recommend obtaining Lipid panel with LDL - Please start statin if LDL > 70 - Recommend HbA1c - Antithrombotic - Aspirin 81mg  daily. - Recommend DVT ppx - SBP goal - permissive hypertension first 24 h < 220/110. Held home meds.  - Recommend Telemetry monitoring for arrythmia - Recommend bedside swallow screen prior to PO intake. - Stroke  education booklet - Recommend PT/OT/SLP consult   _____________________________________________________________________  Thank you for the opportunity to take part in the care of this patient. If you have any further questions, please contact the neurology consultation attending.  Signed,  [place] Pager Number  [phone] _ _ _   _ __   _ __ _ _  __ __   _ __   __ _

## 2022-01-25 NOTE — Consult Note (Addendum)
ELECTROPHYSIOLOGY CONSULT NOTE  Patient ID: Monica Hunt MRN: [id number], DOB/AGE: [birth year]   Admit date: 01/24/2022 Date of Consult: 01/25/2022  Primary Physician: Lavone Orn, MD Primary Cardiologist: Dr. Vivia Ewing Reason for Consultation: Cryptogenic stroke -  recommendations regarding Implantable Loop Recorder, requested by Dr. Leonie Man  History of Present Illness ASPIN Hunt was admitted on 01/24/2022 with R handed weakness (2 weeks) found with stroke.    PMHx includes: HTN, DM, ESRF previously on PD >. HD recently, HLD, syncope, polyneuropathy, hyperthyroidism, GERD  She saw cardiology last Dec 2022, with reports of low BPs and syncope w/hx of prior bradycardia requiring reduction of her BB (remotely with no recurrent syncope until then), planned for monitoring,  BB and was stopped Mentions of some palpitations intermittently at that visit as well. Monitor noted no AFib, some SVT/AT, and NSWCT  Neurology notes: bilateral MCA infarct embolic secondary to suspected cardioembolic  source ALSO  MRI noted  bilateral MCA infarcts of different times; multiple microhemorrhages, c/w cerebral amyloid angiopathy.  she has undergone workup for stroke including echocardiogram and carotid dopplers/angio.  The patient has been monitored on telemetry which has demonstrated sinus rhythm with no arrhythmias.    Neurology has deferred TEE   Echocardiogram this admission demonstrated    IMPRESSIONS   1. Left ventricular ejection fraction, by estimation, is 55 to 60%. The  left ventricle has normal function. The left ventricle has no regional  wall motion abnormalities. There is mild concentric left ventricular  hypertrophy. Left ventricular diastolic  parameters are consistent with Grade I diastolic dysfunction (impaired  relaxation).   2. Right ventricular systolic function is normal. The right ventricular  size is normal. There is normal pulmonary artery systolic pressure.   3. The mitral  valve is normal in structure. Trivial mitral valve  regurgitation. No evidence of mitral stenosis.   4. Tricuspid valve regurgitation is mild to moderate.   5. The aortic valve is normal in structure. Aortic valve regurgitation is  not visualized. Aortic valve sclerosis/calcification is present, without  any evidence of aortic stenosis.   6. The inferior vena cava is normal in size with greater than 50%  respiratory variability, suggesting right atrial pressure of 3 mmHg.   Comparison(s): No significant change from prior study.   Lab work is reviewed. BMET c/w ESRF Anemia Deferred to IM and nephrology   Prior to admission, the patient denies chest pain, shortness of breath. They are recovering from their stroke with plans to home at discharge.   Past Medical History:  Diagnosis Date   Cataracts, bilateral    Depression    Diabetes mellitus without complication ([place])    Hypertension      Surgical History:  Past Surgical History:  Procedure Laterality Date   ABDOMINAL HYSTERECTOMY     vaginal   CARPAL TUNNEL RELEASE Right    COLONOSCOPY WITH PROPOFOL N/A 11/17/2014   Procedure: COLONOSCOPY WITH PROPOFOL;  Surgeon: Garlan Fair, MD;  Location: [facility];  Service: Endoscopy;  Laterality: N/A;     Medications Prior to Admission  Medication Sig Dispense Refill Last Dose   acetaminophen (TYLENOL) 500 MG tablet Take 500-1,000 mg by mouth every 6 (six) hours as needed for mild pain.   unk   atorvastatin (LIPITOR) 40 MG tablet Take 40 mg by mouth daily.   01/23/2022   calcium carbonate (OSCAL) 1500 (600 Ca) MG TABS tablet Take 600 mg of elemental calcium by mouth daily with breakfast.   01/24/2022  EPINEPHrine 0.3 mg/0.3 mL IJ SOAJ injection Inject 0.3 mg into the muscle once.   unk   furosemide (LASIX) 40 MG tablet Take 40 mg by mouth as needed for fluid.   unk   gentamicin cream (GARAMYCIN) 0.1 % Apply 1 application topically daily as needed (skin irritation).   Past Week    glipiZIDE (GLUCOTROL XL) 2.5 MG 24 hr tablet Take 2.5 mg by mouth daily with breakfast.   01/24/2022   methimazole (TAPAZOLE) 5 MG tablet Take 5 mg by mouth daily.   01/24/2022   midodrine (PROAMATINE) 5 MG tablet Take 5 mg by mouth 3 (three) times daily as needed (if BP is low).   unk   Multiple Vitamin (MULTIVITAMIN WITH MINERALS) TABS tablet Take 1 tablet by mouth daily.   01/24/2022   omeprazole (PRILOSEC) 40 MG capsule Take 40 mg by mouth daily.   01/24/2022   rOPINIRole (REQUIP) 3 MG tablet Take 3 mg by mouth at bedtime. Take 1 tab 1 to 3 hours before bedtime   01/23/2022   traZODone (DESYREL) 50 MG tablet Take 100 mg by mouth at bedtime. Take 1-2 tabs daily at bedtime   01/23/2022   ACCU-CHEK SMARTVIEW test strip       clonazePAM (KLONOPIN) 0.5 MG tablet Take 0.5 mg by mouth 2 (two) times daily as needed for anxiety. (Patient not taking: Reported on 01/25/2022)   Not Taking    Inpatient Medications:   aspirin  300 mg Rectal Daily   Or   aspirin  325 mg Oral Daily   atorvastatin  40 mg Oral Daily   Chlorhexidine Gluconate Cloth  6 each Topical Q0600   heparin injection (subcutaneous)  5,000 Units Subcutaneous Q8H   insulin aspart  0-6 Units Subcutaneous TID WC   LORazepam  0.5 mg Oral Once   methimazole  7.5 mg Oral Daily   pantoprazole  40 mg Oral Daily   rOPINIRole  3 mg Oral QHS   sodium chloride flush  3 mL Intravenous Once    Allergies:  Allergies  Allergen Reactions   Bee Venom Anaphylaxis   Other Anaphylaxis    Wasp sting    Social History   Socioeconomic History   Marital status: Divorced    Spouse name: Not on file   Number of children: Not on file   Years of education: Not on file   Highest education level: Not on file  Occupational History   Not on file  Tobacco Use   Smoking status: Every Day    Packs/day: 0.50    Years: 45.00    Pack years: 22.50    Types: Cigarettes   Smokeless tobacco: Never  Vaping Use   Vaping Use: Never used  Substance and  Sexual Activity   Alcohol use: No   Drug use: No   Sexual activity: Not on file  Other Topics Concern   Not on file  Social History Narrative   Not on file   Social Determinants of Health   Financial Resource Strain: Not on file  Food Insecurity: Not on file  Transportation Needs: Not on file  Physical Activity: Not on file  Stress: Not on file  Social Connections: Not on file  Intimate Partner Violence: Not on file     Family History  Problem Relation Age of Onset   Kidney cancer Mother    Lung cancer Father       Review of Systems: All other systems reviewed and are otherwise negative except as  noted above.  Physical Exam: Vitals:   01/25/22 1100 01/25/22 1145 01/25/22 1151 01/25/22 1211  BP: 126/77 106/87  119/85  Pulse: 60 69  68  Resp: 16 16  16   Temp:   98.1 F (36.7 C) 98.1 F (36.7 C)  TempSrc:   Oral Oral  SpO2: 100% 98%  99%  Weight:      Height:        GEN- The patient is well appearing, alert and oriented x 3 today.   Head- normocephalic, atraumatic Eyes-  Sclera clear, conjunctiva pink Ears- hearing intact Oropharynx- clear Neck- supple Lungs- CTA b/l, normal work of breathing Heart- RRR, no murmurs, rubs or gallops  GI- soft, NT, ND Extremities- no clubbing, cyanosis, or edema MS- no significant deformity or atrophy Skin- no rash or lesion Psych- euthymic mood, full affect   Labs:   Lab Results  Component Value Date   WBC 5.3 01/24/2022   HGB 8.8 (L) 01/24/2022   HCT 26.0 (L) 01/24/2022   MCV 100.7 (H) 01/24/2022   PLT 164 01/24/2022    Recent Labs  Lab 01/24/22 1405 01/24/22 1420  NA 140 139  K 4.5 4.5  CL 103 104  CO2 27  --   BUN 16 19  CREATININE 4.00* 4.50*  CALCIUM 8.8*  --   PROT 6.2*  --   BILITOT 0.5  --   ALKPHOS 106  --   ALT 24  --   AST 24  --   GLUCOSE 107* 106*   No results found for: CKTOTAL, CKMB, CKMBINDEX, TROPONINI Lab Results  Component Value Date   CHOL 126 01/25/2022   Lab Results   Component Value Date   HDL 30 (L) 01/25/2022   Lab Results  Component Value Date   LDLCALC 53 01/25/2022   Lab Results  Component Value Date   TRIG 217 (H) 01/25/2022   Lab Results  Component Value Date   CHOLHDL 4.2 01/25/2022   No results found for: LDLDIRECT  No results found for: DDIMER   Radiology/Studies:  MR ANGIO HEAD WO CONTRAST Result Date: 01/25/2022 CLINICAL DATA:  81 year old female with several scattered acute infarcts on brain MRI yesterday, evidence of amyloid angiopathy. EXAM: MRA HEAD WITHOUT CONTRAST TECHNIQUE: Angiographic images of the Circle of Willis were acquired using MRA technique without intravenous contrast. COMPARISON:  Brain MRI 01/24/2022. FINDINGS: Anterior circulation: Antegrade flow in both ICA siphons with mild ICA siphon irregularity and dolichoectasia. Mild stenosis at the right anterior genu. Normal ophthalmic artery origins. Patent carotid termini. Patent MCA and ACA origins. There is a mild irregularity and stenosis in the left ACA distal A2 segment. Other visible ACA branches are within normal limits. MCA M1 segments and bifurcations are patent without stenosis. No MCA branch occlusion identified. No significant MCA branch stenosis. Posterior circulation: Antegrade flow in the posterior circulation with mildly dominant distal left vertebral artery. No distal vertebral stenosis. Both PICA origins are patent. Patent vertebrobasilar junction and basilar artery without stenosis. Patent basilar tip, SCA and PCA origins. Posterior communicating arteries are diminutive or absent. No PCA branch occlusion is identified, but there is moderate irregularity of the bilateral P3 branches, with associated stenosis greater on the left side. Anatomic variants: Mildly dominant left vertebral artery. Other: Widespread susceptibility artifact related to amyloid angiopathy. IMPRESSION: 1. No circle-of-Willis branch occlusion identified. 2. Intracranial atherosclerosis with  moderate irregularity and stenosis of the PCA P3 segments, greater on the Left. Mild stenosis in the anterior circulation at the right  ICA anterior genu and distal left ACA A2. 3. Susceptibility artifact related to Amyloid Angiopathy. Electronically Signed   By: Genevie Ann M.D.   On: 01/25/2022 04:51   MR BRAIN WO CONTRAST Result Date: 01/24/2022 CLINICAL DATA:  Loss of dexterity in right hand EXAM: MRI HEAD WITHOUT CONTRAST TECHNIQUE: Multiplanar, multiecho pulse sequences of the brain and surrounding structures were obtained without intravenous contrast. COMPARISON:  None. FINDINGS: Brain: There are scattered infarcts in both cerebral and cerebellar hemispheres, some of which appear acute while others appear more subacute. The largest infarct is in the left of the frontal lobe with involvement precentral gyrus and likely accounts for the patient's symptoms. Additional acute to subacute infarcts are seen in the right superior frontal gyrus, bilateral temporoparietal periventricular white matter and right occipital lobe. There is no significant hemorrhage associated with these infarcts. There is no acute extra-axial fluid collection. There is a background of mild global parenchymal volume loss with prominence of the ventricular system and extra-axial CSF spaces. There is extensive confluent FLAIR signal abnormality throughout the subcortical and periventricular white matter likely reflecting sequela of advanced chronic white matter microangiopathy. There is an additional remote infarct in the left cerebellar hemisphere and small remote infarct in the left corona radiata. There are extensive punctate chronic microhemorrhages throughout the cerebral and cerebellar hemispheres which are predominantly peripheral in distribution. There is no mass lesion.  There is no midline shift. Vascular: Normal flow voids. Skull and upper cervical spine: Normal marrow signal. Sinuses/Orbits: The paranasal sinuses are clear. Bilateral  lens implants are in place. The globes and orbits are otherwise unremarkable. Other: There is a left mastoid effusion. IMPRESSION: 1. Scattered infarcts in the bilateral cerebral hemispheres as detailed above, some of which appear acute while others appear more subacute. The largest infarct is in the left frontal lobe with involvement of the precentral gyrus, likely accounting for the patient's reported symptoms. Consider embolic etiology given involvement of multiple vascular territories. 2. Background of mild global parenchymal volume loss and advanced chronic white matter microangiopathy. Small remote infarcts in the left corona radiata and left cerebellar hemisphere. 3. Extensive punctate chronic microhemorrhages throughout the cerebral and cerebellar hemispheres predominantly peripheral in distribution concerning for cerebral amyloid angiopathy. Electronically Signed   By: Valetta Mole M.D.   On: 01/24/2022 16:41   MR Cervical Spine Wo Contrast Result Date: 01/24/2022 CLINICAL DATA:  Loss of dexterity in right hand starting 01/09/2022 EXAM: MRI CERVICAL SPINE WITHOUT CONTRAST TECHNIQUE: Multiplanar, multisequence MR imaging of the cervical spine was performed. No intravenous contrast was administered. COMPARISON:  None. FINDINGS: There is intermittent motion degradation. The sagittal T1 images are mildly motion degraded, and the axial images are moderately motion degraded. Within this confine: Alignment: Normal. Vertebrae: Vertebral body heights are preserved. There is no suspicious marrow signal abnormality or marrow edema. Cord: Normal in signal and morphology, within the confines of motion degraded images. Posterior Fossa, vertebral arteries, paraspinal tissues: The posterior fossa is assessed on the separately dictated brain MRI. The vertebral artery flow voids are present. The paraspinal soft tissues are unremarkable. Disc levels: The disc heights are overall preserved. C2-C3: No significant spinal  canal or neural foraminal stenosis C3-C4: No significant spinal canal or neural foraminal stenosis C4-C5: There is left worse than right facet arthropathy with prominent left-sided osteophytosis/ligamentum flavum thickening (9-12). Findings result in mild spinal canal stenosis with slight rightward displacement of the cervical cord but no evidence of frank cord compression or signal abnormality. There is mild  left and no significant right neural foraminal stenosis. C5-C6: Mild uncovertebral and facet arthropathy resulting in mild left and no significant right neural foraminal stenosis and no significant spinal canal stenosis. C6-C7: No significant spinal canal or neural foraminal stenosis. C7-T1: No significant spinal canal or neural foraminal stenosis. IMPRESSION: 1. Left worse than right facet arthropathy with prominent left osteophytosis/ligamentum flavum thickening resulting in mild spinal canal stenosis with slight rightward displacement of the cord but no frank cord compression or definite cord signal abnormality. 2. Otherwise, minimal for age degenerative changes in the cervical spine without other high-grade spinal canal or neural foraminal stenosis. Electronically Signed   By: Valetta Mole M.D.   On: 01/24/2022 16:18    VAS US CAROTID Result Date: 01/25/2022 Carotid Arterial Duplex Study Patient Name:  GEOVANNA SIMKO  Date of Exam:   01/25/2022 Medical Rec #: [id number]     Accession #:    [id number] Date of Birth: [birth year]     Patient Gender: F Patient Age:   72 years Exam Location:  [facility] Procedure:      VAS US CAROTID Referring Phys: Sheppard Coil MELVIN --------------------------------------------------------------------------------  Indications:       CVA. Risk Factors:      Hypertension, Diabetes. Comparison Study:  no prior Performing Technologist: Archie Patten RVS  Examination Guidelines: A complete evaluation includes B-mode imaging, spectral Doppler, color Doppler, and power Doppler as  needed of all accessible portions of each vessel. Bilateral testing is considered an integral part of a complete examination. Limited examinations for reoccurring indications may be performed as noted.  Right Carotid Findings: Summary: Right Carotid: Velocities in the right ICA are consistent with a 1-39% stenosis. Left Carotid: Velocities in the left ICA are consistent with a 1-39% stenosis. Vertebrals: Bilateral vertebral arteries demonstrate antegrade flow. *See table(s) above for measurements and observations.     Preliminary     12-lead ECG SR All prior EKG's in EPIC reviewed with no documented atrial fibrillation  Telemetry SR, occ PVCs  Assessment and Plan:  1. Cryptogenic stroke The patient presents with cryptogenic stroke.   I spoke at length with the patient about monitoring for afib with an implantable loop recorder.  Risks, benefits, and alteratives to implantable loop recorder were discussed with the patient today.   At this time, the patient is very clear in their decision to proceed with implantable loop recorder.   NOTE:  in d/w Dr. Leonie Man, given finding of Extensive punctate chronic microhemorrhages throughout the cerebral and cerebellar hemispheres, she is NOT a longterm anticoagulation candidate. Though high clinical suspicion of Afib and if AFib is found, does think she would/could be anticoagulated short term to get to/past a watchman procedure as a long term, management strategy  So if AFib is found in her, discussion with Dr. Johnell Comings team for potential watchman and involvement with neurology PRIOR to start of a/c would need to be done,   Wound care was reviewed with the patient (keep incision clean and dry for 3 days).  Wound check follow up Leniyah Martell be scheduled for the patient.  Please call with questions.   Baldwin Jamaica, PA-C 01/25/2022  I have seen and examined this patient with Tommye Standard.  Agree with above, note added to reflect my findings.  On exam,  RRR, no murmurs, lungs clear.  Patient presented to the hospital with cryptogenic stroke. To date, no cause has been found. No cause has been found, Monia Timmers plan for LINQ monitor to look for atrial  fibrillation. Risks and benefits discussed. Risks include but not limited to bleeding and infection. The patient understands the risks and has agreed to the procedure.  Lonie Newsham M. Darrol Brandenburg MD 01/25/2022 4:25 PM

## 2022-01-25 NOTE — Evaluation (Signed)
Speech Language Pathology Evaluation Patient Details Name: Monica Hunt MRN: [id number] DOB: 12-23-1941 Today's Date: 01/25/2022 Time: 9833-8250 SLP Time Calculation (min) (ACUTE ONLY): 19 min  Problem List:  Patient Active Problem List   Diagnosis Date Noted   Anxiety 01/24/2022   Gastroesophageal reflux disease 01/24/2022   Restless legs 01/24/2022   Acute CVA (cerebrovascular accident) ([place]) 01/24/2022   End stage renal failure on dialysis ([place]) 01/21/2022   Anemia in chronic kidney disease 08/11/2021   HLD (hyperlipidemia) 07/05/2021   Hyperthyroidism 07/05/2021   Plantar fasciitis 07/15/2019   Vasovagal syncope 05/18/2017   Controlled type 2 diabetes mellitus with kidney complication, without long-term current use of insulin ([place]) 05/18/2017   CKD (chronic kidney disease) stage 4, GFR 15-29 ml/min ([place]) 05/18/2017   Essential hypertension 05/18/2017   Past Medical History:  Past Medical History:  Diagnosis Date   Cataracts, bilateral    Depression    Diabetes mellitus without complication ([place])    Hypertension    Past Surgical History:  Past Surgical History:  Procedure Laterality Date   ABDOMINAL HYSTERECTOMY     vaginal   CARPAL TUNNEL RELEASE Right    COLONOSCOPY WITH PROPOFOL N/A 11/17/2014   Procedure: COLONOSCOPY WITH PROPOFOL;  Surgeon: Garlan Fair, MD;  Location: [facility];  Service: Endoscopy;  Laterality: N/A;   HPI:  Pt is a 81 y.o. female who presented right hand weakness. MRI Brain demonstrated multiple embolic appearing infarcts along with microhemorrhages with the distribution concerning for cerebral amyloid angiopathy. PMH: syncope, hypertension, diabetes, ESRD on HD, depression, anxiety, anemia, GERD, hyperlipidemia, restless leg syndrome, hypothyroidism   Assessment / Plan / Recommendation Clinical Impression  Pt participated in speech/language/cognition evaluation.  She reported that she has a high-school education and currently lives alone.  Per the pt, she was independent with medication and financial management PTA and typically completed four pillboxes at a time. She initially stated that her thinking and memory are "slower", but then denied any acute cognitive changes once this SLP recommended outpatient SLP services at the end of the evaluation. Motor speech skills were WNL and no overt language impairments were noted. The [facility] Mental Status Examination was completed to evaluate the pt's cognitive-linguistic skills. She achieved a score of 22/30 which is below the normal limits of 27 or more out of 30 and is suggestive of a mild impairment. She exhibited difficulty in the areas of awareness, temporal orientation, memory, and executive function. Skilled SLP services are clinically indicated at this time to improve cognitive-linguistic function.    SLP Assessment  SLP Recommendation/Assessment: Patient needs continued Speech [place] [facility] SLP Visit Diagnosis: Cognitive communication deficit (R41.841)    Recommendations for follow up therapy are one component of a multi-disciplinary discharge planning process, led by the attending physician.  Recommendations may be updated based on patient status, additional functional criteria and insurance authorization.    Follow Up Recommendations  Outpatient SLP    Assistance Recommended at Discharge  Intermittent Supervision/Assistance  Functional Status Assessment Patient has had a recent decline in their functional status and demonstrates the ability to make significant improvements in function in a reasonable and predictable amount of time.  Frequency and Duration min 2x/week  2 weeks      SLP Evaluation Cognition  Overall Cognitive Status: No family/caregiver present to determine baseline cognitive functioning Arousal/Alertness: Awake/alert Orientation Level: Oriented to person;Oriented to place;Oriented to situation;Disoriented to time Year:  2023 Month: February Day of Week: Incorrect Attention: Focused;Sustained Focused Attention:  Appears intact Sustained Attention: Appears intact Memory: Impaired Memory Impairment:  (Immediate: 5/5; delayed: 5/5; paragraph: 3/4) Awareness: Impaired Awareness Impairment: Emergent impairment Problem Solving: Impaired Problem Solving Impairment: Verbal complex (Money: 2/2 with additional proccessing time.) Executive Function: Sequencing;Organizing Sequencing: Impaired Sequencing Impairment: Verbal complex (clock drawing: 2/4) Organizing: Impaired Organizing Impairment: Verbal complex (Backward digit span: 0/2)       Comprehension  Auditory Comprehension Overall Auditory Comprehension: Appears within functional limits for tasks assessed Yes/No Questions: Within Functional Limits Commands: Within Functional Limits Conversation: Complex    Expression Expression Primary Mode of Expression: Verbal Verbal Expression Overall Verbal Expression: Appears within functional limits for tasks assessed Initiation: No impairment Level of Generative/Spontaneous Verbalization: Conversation Repetition: No impairment Naming: No impairment Pragmatics: No impairment Written Expression Dominant Hand: Right   Oral / Motor  Oral Motor/Sensory Function Overall Oral Motor/Sensory Function: Within functional limits Motor Speech Overall Motor Speech: Appears within functional limits for tasks assessed Respiration: Within functional limits Phonation: Normal Resonance: Within functional limits Articulation: Within functional limitis Intelligibility: Intelligible Motor Planning: Witnin functional limits Motor Speech Errors: Not applicable           Holt Woolbright I. Hardin Negus, [place], [place] Office number [phone] Pager [place] 01/25/2022, 3:38 PM

## 2022-01-25 NOTE — Assessment & Plan Note (Signed)
-   Continue home medications 

## 2022-01-25 NOTE — Assessment & Plan Note (Signed)
Continue PPI ?

## 2022-01-25 NOTE — Assessment & Plan Note (Signed)
-  Continue methimazole ?

## 2022-01-25 NOTE — Assessment & Plan Note (Signed)
Routine HD (last was 1/30) is due today. Nephrology notified, though no urgent HD needs. Plan for HD today, though was bumped due to acute cases, planned for 2/2 at [place].

## 2022-01-25 NOTE — TOC Initial Note (Signed)
Transition of Care [facility]) - Initial/Assessment Note    Patient Details  Name: Monica Hunt MRN: [id number] Date of Birth: [birth year]  Transition of [facility] CM/SW Contact:    Pollie Friar, RN Phone Number: 01/25/2022, 3:56 PM  Clinical Narrative:                 Patient is from home alone. She has a friend and daughter that can check on her. Her family provides transportation. She denies issues with home medications.  Recommendations are for outpatient therapy. Pt lives in [place] and asked to attend in [place]. CM will fax the orders to [facility] [phone].  TOC following for further d/c needs.   Expected Discharge Plan: OP Rehab Barriers to Discharge: Continued Medical Work up   Patient Goals and CMS Choice     Choice offered to / list presented to : Patient  Expected Discharge Plan and Services Expected Discharge Plan: OP Rehab   Discharge Planning Services: CM Consult   Living arrangements for the past 2 months: Single Family Home                                      Prior Living Arrangements/Services Living arrangements for the past 2 months: Single Family Home Lives with:: Self Patient language and need for interpreter reviewed:: Yes Do you feel safe going back to the place where you live?: Yes        Care giver support system in place?: No (comment)   Criminal Activity/Legal Involvement Pertinent to Current Situation/Hospitalization: No - Comment as needed  Activities of Daily Living Home Assistive Devices/Equipment: None ADL Screening (condition at time of admission) Patient's cognitive ability adequate to safely complete daily activities?: Yes Is the patient deaf or have difficulty hearing?: No Does the patient have difficulty seeing, even when wearing glasses/contacts?: No Does the patient have difficulty concentrating, remembering, or making decisions?: No Patient able to express need for assistance with ADLs?:  No Does the patient have difficulty dressing or bathing?: No Independently performs ADLs?: Yes (appropriate for developmental age) Does the patient have difficulty walking or climbing stairs?: No Weakness of Legs: None Weakness of Arms/Hands: None  Permission Sought/Granted                  Emotional Assessment Appearance:: Appears stated age Attitude/Demeanor/Rapport: Engaged Affect (typically observed): Accepting Orientation: : Oriented to Self, Oriented to Place, Oriented to  Time, Oriented to Situation   Psych Involvement: No (comment)  Admission diagnosis:  Acute CVA (cerebrovascular accident) [facility]) [I63.9] Patient Active Problem List   Diagnosis Date Noted   Anxiety 01/24/2022   Gastroesophageal reflux disease 01/24/2022   Restless legs 01/24/2022   Acute CVA (cerebrovascular accident) ([place]) 01/24/2022   End stage renal failure on dialysis ([place]) 01/21/2022   Anemia in chronic kidney disease 08/11/2021   HLD (hyperlipidemia) 07/05/2021   Hyperthyroidism 07/05/2021   Plantar fasciitis 07/15/2019   Vasovagal syncope 05/18/2017   Controlled type 2 diabetes mellitus with kidney complication, without long-term current use of insulin ([place]) 05/18/2017   CKD (chronic kidney disease) stage 4, GFR 15-29 ml/min ([place]) 05/18/2017   Essential hypertension 05/18/2017   PCP:  Lavone Orn, MD Pharmacy:   [id number] - [place], [place] - [address] [facility] [place] [address] Phone: [phone] Fax: [phone]     Social Determinants of Health (SDOH) Interventions  Readmission Risk Interventions No flowsheet data found.

## 2022-01-25 NOTE — Consult Note (Signed)
[facility] Renal Consultation Note    Indication for Consultation:  Management of ESRD/hemodialysis, anemia, hypertension/volume, and secondary hyperparathyroidism.  HPI: Monica Hunt is a 80 y.o. female with PMH including ESRD, diabetes mellitus, and hypertension. She switched from PD to HD in mid January due to radial nerve palsy. Takes midodrine PRN for hypotension but reports she has not needed it since starting hemodialysis. She reports weakness in her right hand for about 2 weeks. Her PCP recommended she come to the ED today for stroke work up. MRI of the brain showed scattered infarct bilaterally both acute and subacute infarcts.  Small old infarcts also noted and there is microhemorrhages with distribution concerning for cerebral amyloid angiopathy.  MRI of the C-spine also performed and showed facet arthropathy with spinal stenosis without any evidence of cord compression. Neurology recommended TTE and permissive hypertension.  At time of exam, patient reports she is feeling "fine." Denies SOB, orthopnea, dyspnea, peripheral edema, CP, palpitations, dizziness, abdominal pain, N/V/D. Reports no issues with hemodialysis since she started in mid-January.   Past Medical History:  Diagnosis Date   Cataracts, bilateral    Depression    Diabetes mellitus without complication ([place])    Hypertension    Past Surgical History:  Procedure Laterality Date   ABDOMINAL HYSTERECTOMY     vaginal   CARPAL TUNNEL RELEASE Right    COLONOSCOPY WITH PROPOFOL N/A 11/17/2014   Procedure: COLONOSCOPY WITH PROPOFOL;  Surgeon: Garlan Fair, MD;  Location: [facility];  Service: Endoscopy;  Laterality: N/A;   Family History  Problem Relation Age of Onset   Kidney cancer Mother    Lung cancer Father    Social History:  reports that she has been smoking cigarettes. She has a 22.50 pack-year smoking history. She has never used smokeless tobacco. She reports that she does not drink alcohol  and does not use drugs.  ROS: As per HPI otherwise negative.   Physical Exam: Vitals:   01/25/22 0715 01/25/22 0835 01/25/22 0843 01/25/22 0930  BP: 138/67 (!) 113/97  (!) 147/73  Pulse: 68 70  77  Resp: 15 12  19   Temp:   98 F (36.7 C)   TempSrc:   Oral   SpO2: 97% 98%  100%  Weight:      Height:         General: Well developed, well nourished, in no acute distress. Head: Normocephalic, atraumatic, sclera non-icteric, mucus membranes are moist. Lungs: Clear bilaterally to auscultation without wheezes, rales, or rhonchi. Breathing is unlabored on RA.  Heart: RRR with normal S1, S2. No murmurs, rubs, or gallops appreciated. Abdomen: Soft, non-tender, non-distended with normoactive bowel sounds. No rebound/guarding. No obvious abdominal masses. Musculoskeletal:  Strength and tone appear normal for age. Lower extremities: No edema or ischemic changes, no open wounds. Neuro: Alert and oriented X 3. Moves all extremities spontaneously. Psych:  Responds to questions appropriately with a normal affect. Dialysis Access: R IJ TDC  Allergies  Allergen Reactions   Bee Venom Anaphylaxis   Other Anaphylaxis    Wasp sting   Prior to Admission medications   Medication Sig Start Date End Date Taking? Authorizing Provider  acetaminophen (TYLENOL) 500 MG tablet Take 500-1,000 mg by mouth every 6 (six) hours as needed for mild pain.   Yes [provider]  atorvastatin (LIPITOR) 40 MG tablet Take 40 mg by mouth daily.   Yes [provider]  calcium carbonate (OSCAL) 1500 (600 Ca) MG TABS tablet Take 600  mg of elemental calcium by mouth daily with breakfast.   Yes [provider]  EPINEPHrine 0.3 mg/0.3 mL IJ SOAJ injection Inject 0.3 mg into the muscle once.   Yes [provider]  furosemide (LASIX) 40 MG tablet Take 40 mg by mouth as needed for fluid.   Yes [provider]  gentamicin cream (GARAMYCIN) 0.1 % Apply 1 application topically daily as  needed (skin irritation). 07/26/21  Yes [provider]  glipiZIDE (GLUCOTROL XL) 2.5 MG 24 hr tablet Take 2.5 mg by mouth daily with breakfast. 08/16/21  Yes [provider]  methimazole (TAPAZOLE) 5 MG tablet Take 5 mg by mouth daily.   Yes [provider]  midodrine (PROAMATINE) 5 MG tablet Take 5 mg by mouth 3 (three) times daily as needed (if BP is low). 01/10/22  Yes [provider]  Multiple Vitamin (MULTIVITAMIN WITH MINERALS) TABS tablet Take 1 tablet by mouth daily.   Yes [provider]  omeprazole (PRILOSEC) 40 MG capsule Take 40 mg by mouth daily.   Yes [provider]  rOPINIRole (REQUIP) 3 MG tablet Take 3 mg by mouth at bedtime. Take 1 tab 1 to 3 hours before bedtime   Yes [provider]  traZODone (DESYREL) 50 MG tablet Take 100 mg by mouth at bedtime. Take 1-2 tabs daily at bedtime   Yes [provider]  ACCU-CHEK SMARTVIEW test strip  01/12/20   [provider]  clonazePAM (KLONOPIN) 0.5 MG tablet Take 0.5 mg by mouth 2 (two) times daily as needed for anxiety. Patient not taking: Reported on 01/25/2022    [provider]   Current Facility-Administered Medications  Medication Dose Route Frequency Provider Last Rate Last Admin   acetaminophen (TYLENOL) tablet 650 mg  650 mg Oral Q4H PRN Marcelyn Bruins, MD       Or   acetaminophen (TYLENOL) 160 MG/5ML solution 650 mg  650 mg Per Tube Q4H PRN Marcelyn Bruins, MD       Or   acetaminophen (TYLENOL) suppository 650 mg  650 mg Rectal Q4H PRN Marcelyn Bruins, MD       aspirin suppository 300 mg  300 mg Rectal Daily Marcelyn Bruins, MD       Or   aspirin tablet 325 mg  325 mg Oral Daily Marcelyn Bruins, MD   325 mg at 01/25/22 0908   atorvastatin (LIPITOR) tablet 40 mg  40 mg Oral Daily Marcelyn Bruins, MD   40 mg at 01/25/22 0908   clonazePAM (KLONOPIN) tablet 0.5 mg  0.5 mg Oral BID PRN Marcelyn Bruins, MD        EPINEPHrine (EPI-PEN) injection 0.3 mg  0.3 mg Intramuscular Daily PRN Marcelyn Bruins, MD       heparin injection 5,000 Units  5,000 Units Subcutaneous Q8H Marcelyn Bruins, MD   5,000 Units at 01/25/22 9735   insulin aspart (novoLOG) injection 0-6 Units  0-6 Units Subcutaneous TID WC Marcelyn Bruins, MD       LORazepam (ATIVAN) tablet 0.5 mg  0.5 mg Oral Once Marcelyn Bruins, MD       methimazole (TAPAZOLE) tablet 7.5 mg  7.5 mg Oral Daily Marcelyn Bruins, MD   7.5 mg at 01/25/22 0908   pantoprazole (PROTONIX) EC tablet 40 mg  40 mg Oral Daily Marcelyn Bruins, MD   40 mg at 01/25/22 0908   rOPINIRole (REQUIP) tablet 3 mg  3 mg Oral  Lawerance Cruel, MD   3 mg at 01/24/22 2214   senna-docusate (Senokot-S) tablet 1 tablet  1 tablet Oral QHS PRN Marcelyn Bruins, MD       sodium chloride flush (NS) 0.9 % injection 3 mL  3 mL Intravenous Once Marcelyn Bruins, MD       traZODone (DESYREL) tablet 50 mg  50 mg Oral QHS PRN Marcelyn Bruins, MD   50 mg at 01/24/22 2214   Current Outpatient Medications  Medication Sig Dispense Refill   acetaminophen (TYLENOL) 500 MG tablet Take 500-1,000 mg by mouth every 6 (six) hours as needed for mild pain.     atorvastatin (LIPITOR) 40 MG tablet Take 40 mg by mouth daily.     calcium carbonate (OSCAL) 1500 (600 Ca) MG TABS tablet Take 600 mg of elemental calcium by mouth daily with breakfast.     EPINEPHrine 0.3 mg/0.3 mL IJ SOAJ injection Inject 0.3 mg into the muscle once.     furosemide (LASIX) 40 MG tablet Take 40 mg by mouth as needed for fluid.     gentamicin cream (GARAMYCIN) 0.1 % Apply 1 application topically daily as needed (skin irritation).     glipiZIDE (GLUCOTROL XL) 2.5 MG 24 hr tablet Take 2.5 mg by mouth daily with breakfast.     methimazole (TAPAZOLE) 5 MG tablet Take 5 mg by mouth daily.     midodrine (PROAMATINE) 5 MG tablet Take 5 mg by mouth 3 (three) times daily as needed (if BP is low).     Multiple  Vitamin (MULTIVITAMIN WITH MINERALS) TABS tablet Take 1 tablet by mouth daily.     omeprazole (PRILOSEC) 40 MG capsule Take 40 mg by mouth daily.     rOPINIRole (REQUIP) 3 MG tablet Take 3 mg by mouth at bedtime. Take 1 tab 1 to 3 hours before bedtime     traZODone (DESYREL) 50 MG tablet Take 100 mg by mouth at bedtime. Take 1-2 tabs daily at bedtime     ACCU-CHEK SMARTVIEW test strip      clonazePAM (KLONOPIN) 0.5 MG tablet Take 0.5 mg by mouth 2 (two) times daily as needed for anxiety. (Patient not taking: Reported on 01/25/2022)     Labs: Basic Metabolic Panel: Recent Labs  Lab 01/24/22 1405 01/24/22 1420  NA 140 139  K 4.5 4.5  CL 103 104  CO2 27  --   GLUCOSE 107* 106*  BUN 16 19  CREATININE 4.00* 4.50*  CALCIUM 8.8*  --    Liver Function Tests: Recent Labs  Lab 01/24/22 1405  AST 24  ALT 24  ALKPHOS 106  BILITOT 0.5  PROT 6.2*  ALBUMIN 3.0*   No results for input(s): LIPASE, AMYLASE in the last 168 hours. No results for input(s): AMMONIA in the last 168 hours. CBC: Recent Labs  Lab 01/24/22 1405 01/24/22 1420  WBC 5.3  --   NEUTROABS 3.4  --   HGB 8.8* 8.8*  HCT 28.3* 26.0*  MCV 100.7*  --   PLT 164  --    Cardiac Enzymes: No results for input(s): CKTOTAL, CKMB, CKMBINDEX, TROPONINI in the last 168 hours. CBG: Recent Labs  Lab 01/24/22 1644 01/24/22 2353 01/25/22 0742  GLUCAP 103* 148* 98   Iron Studies: No results for input(s): IRON, TIBC, TRANSFERRIN, FERRITIN in the last 72 hours. Studies/Results: MR ANGIO HEAD WO CONTRAST  Result Date: 01/25/2022 CLINICAL DATA:  81 year old female with several scattered acute infarcts on brain MRI yesterday,  evidence of amyloid angiopathy. EXAM: MRA HEAD WITHOUT CONTRAST TECHNIQUE: Angiographic images of the Circle of Willis were acquired using MRA technique without intravenous contrast. COMPARISON:  Brain MRI 01/24/2022. FINDINGS: Anterior circulation: Antegrade flow in both ICA siphons with mild ICA siphon  irregularity and dolichoectasia. Mild stenosis at the right anterior genu. Normal ophthalmic artery origins. Patent carotid termini. Patent MCA and ACA origins. There is a mild irregularity and stenosis in the left ACA distal A2 segment. Other visible ACA branches are within normal limits. MCA M1 segments and bifurcations are patent without stenosis. No MCA branch occlusion identified. No significant MCA branch stenosis. Posterior circulation: Antegrade flow in the posterior circulation with mildly dominant distal left vertebral artery. No distal vertebral stenosis. Both PICA origins are patent. Patent vertebrobasilar junction and basilar artery without stenosis. Patent basilar tip, SCA and PCA origins. Posterior communicating arteries are diminutive or absent. No PCA branch occlusion is identified, but there is moderate irregularity of the bilateral P3 branches, with associated stenosis greater on the left side. Anatomic variants: Mildly dominant left vertebral artery. Other: Widespread susceptibility artifact related to amyloid angiopathy. IMPRESSION: 1. No circle-of-Willis branch occlusion identified. 2. Intracranial atherosclerosis with moderate irregularity and stenosis of the PCA P3 segments, greater on the Left. Mild stenosis in the anterior circulation at the right ICA anterior genu and distal left ACA A2. 3. Susceptibility artifact related to Amyloid Angiopathy. Electronically Signed   By: Genevie Ann M.D.   On: 01/25/2022 04:51   MR BRAIN WO CONTRAST  Result Date: 01/24/2022 CLINICAL DATA:  Loss of dexterity in right hand EXAM: MRI HEAD WITHOUT CONTRAST TECHNIQUE: Multiplanar, multiecho pulse sequences of the brain and surrounding structures were obtained without intravenous contrast. COMPARISON:  None. FINDINGS: Brain: There are scattered infarcts in both cerebral and cerebellar hemispheres, some of which appear acute while others appear more subacute. The largest infarct is in the left of the frontal  lobe with involvement precentral gyrus and likely accounts for the patient's symptoms. Additional acute to subacute infarcts are seen in the right superior frontal gyrus, bilateral temporoparietal periventricular white matter and right occipital lobe. There is no significant hemorrhage associated with these infarcts. There is no acute extra-axial fluid collection. There is a background of mild global parenchymal volume loss with prominence of the ventricular system and extra-axial CSF spaces. There is extensive confluent FLAIR signal abnormality throughout the subcortical and periventricular white matter likely reflecting sequela of advanced chronic white matter microangiopathy. There is an additional remote infarct in the left cerebellar hemisphere and small remote infarct in the left corona radiata. There are extensive punctate chronic microhemorrhages throughout the cerebral and cerebellar hemispheres which are predominantly peripheral in distribution. There is no mass lesion.  There is no midline shift. Vascular: Normal flow voids. Skull and upper cervical spine: Normal marrow signal. Sinuses/Orbits: The paranasal sinuses are clear. Bilateral lens implants are in place. The globes and orbits are otherwise unremarkable. Other: There is a left mastoid effusion. IMPRESSION: 1. Scattered infarcts in the bilateral cerebral hemispheres as detailed above, some of which appear acute while others appear more subacute. The largest infarct is in the left frontal lobe with involvement of the precentral gyrus, likely accounting for the patient's reported symptoms. Consider embolic etiology given involvement of multiple vascular territories. 2. Background of mild global parenchymal volume loss and advanced chronic white matter microangiopathy. Small remote infarcts in the left corona radiata and left cerebellar hemisphere. 3. Extensive punctate chronic microhemorrhages throughout the cerebral and cerebellar hemispheres  predominantly peripheral in distribution concerning for cerebral amyloid angiopathy. Electronically Signed   By: Valetta Mole M.D.   On: 01/24/2022 16:41   MR Cervical Spine Wo Contrast  Result Date: 01/24/2022 CLINICAL DATA:  Loss of dexterity in right hand starting 01/09/2022 EXAM: MRI CERVICAL SPINE WITHOUT CONTRAST TECHNIQUE: Multiplanar, multisequence MR imaging of the cervical spine was performed. No intravenous contrast was administered. COMPARISON:  None. FINDINGS: There is intermittent motion degradation. The sagittal T1 images are mildly motion degraded, and the axial images are moderately motion degraded. Within this confine: Alignment: Normal. Vertebrae: Vertebral body heights are preserved. There is no suspicious marrow signal abnormality or marrow edema. Cord: Normal in signal and morphology, within the confines of motion degraded images. Posterior Fossa, vertebral arteries, paraspinal tissues: The posterior fossa is assessed on the separately dictated brain MRI. The vertebral artery flow voids are present. The paraspinal soft tissues are unremarkable. Disc levels: The disc heights are overall preserved. C2-C3: No significant spinal canal or neural foraminal stenosis C3-C4: No significant spinal canal or neural foraminal stenosis C4-C5: There is left worse than right facet arthropathy with prominent left-sided osteophytosis/ligamentum flavum thickening (9-12). Findings result in mild spinal canal stenosis with slight rightward displacement of the cervical cord but no evidence of frank cord compression or signal abnormality. There is mild left and no significant right neural foraminal stenosis. C5-C6: Mild uncovertebral and facet arthropathy resulting in mild left and no significant right neural foraminal stenosis and no significant spinal canal stenosis. C6-C7: No significant spinal canal or neural foraminal stenosis. C7-T1: No significant spinal canal or neural foraminal stenosis. IMPRESSION: 1.  Left worse than right facet arthropathy with prominent left osteophytosis/ligamentum flavum thickening resulting in mild spinal canal stenosis with slight rightward displacement of the cord but no frank cord compression or definite cord signal abnormality. 2. Otherwise, minimal for age degenerative changes in the cervical spine without other high-grade spinal canal or neural foraminal stenosis. Electronically Signed   By: Valetta Mole M.D.   On: 01/24/2022 16:18   VAS US CAROTID  Result Date: 01/25/2022 Carotid Arterial Duplex Study Patient Name:  Monica Hunt  Date of Exam:   01/25/2022 Medical Rec #: [id number]     Accession #:    [id number] Date of Birth: March 04, 1941     Patient Gender: F Patient Age:   71 years Exam Location:  [facility] Procedure:      VAS US CAROTID Referring Phys: Sheppard Coil MELVIN --------------------------------------------------------------------------------  Indications:       CVA. Risk Factors:      Hypertension, Diabetes. Comparison Study:  no prior Performing Technologist: Archie Patten RVS  Examination Guidelines: A complete evaluation includes B-mode imaging, spectral Doppler, color Doppler, and power Doppler as needed of all accessible portions of each vessel. Bilateral testing is considered an integral part of a complete examination. Limited examinations for reoccurring indications may be performed as noted.  Right Carotid Findings: +----------+--------+--------+--------+------------------+--------+             PSV cm/s EDV cm/s Stenosis Plaque Description Comments  +----------+--------+--------+--------+------------------+--------+  CCA Prox   67       11                heterogenous                 +----------+--------+--------+--------+------------------+--------+  CCA Distal 63       14                heterogenous                 +----------+--------+--------+--------+------------------+--------+  ICA Prox   75       23       1-39%    heterogenous                  +----------+--------+--------+--------+------------------+--------+  ICA Distal 81       30                                             +----------+--------+--------+--------+------------------+--------+  ECA        132      16                                             +----------+--------+--------+--------+------------------+--------+ +----------+--------+-------+--------+-------------------+             PSV cm/s EDV cms Describe Arm Pressure (mmHG)  +----------+--------+-------+--------+-------------------+  Subclavian 69                                             +----------+--------+-------+--------+-------------------+ +---------+--------+--+--------+--+---------+  Vertebral PSV cm/s 45 EDV cm/s 11 Antegrade  +---------+--------+--+--------+--+---------+  Left Carotid Findings: +----------+--------+--------+--------+-------------------------+--------+             PSV cm/s EDV cm/s Stenosis Plaque Description        Comments  +----------+--------+--------+--------+-------------------------+--------+  CCA Prox   70       16                heterogenous                        +----------+--------+--------+--------+-------------------------+--------+  CCA Distal 62       13                heterogenous                        +----------+--------+--------+--------+-------------------------+--------+  ICA Prox   73       23       1-39%    heterogenous and calcific           +----------+--------+--------+--------+-------------------------+--------+  ICA Distal 150      38                                                    +----------+--------+--------+--------+-------------------------+--------+  ECA        155      19                                                    +----------+--------+--------+--------+-------------------------+--------+ +----------+--------+--------+--------+-------------------+             PSV cm/s EDV cm/s Describe Arm Pressure (mmHG)   +----------+--------+--------+--------+-------------------+  Subclavian 78                                              +----------+--------+--------+--------+-------------------+ +---------+--------+--+--------+--+---------+  Vertebral PSV cm/s 58 EDV cm/s 13 Antegrade  +---------+--------+--+--------+--+---------+   Summary: Right Carotid: Velocities in the right ICA are consistent with a 1-39% stenosis. Left Carotid: Velocities in the left ICA are consistent with a 1-39% stenosis. Vertebrals: Bilateral vertebral arteries demonstrate antegrade flow. *See table(s) above for measurements and observations.     Preliminary     Dialysis Orders:  Center: Center  on MWF . 180NRe, 3hr 15 min, BFR 400 DFR 600 EDW 56kg 3K 2.5Ca TDC No heparin bolus No ESA/VDRA   Assessment/Plan:  Acute CVA: found to have multiple embolic infarcts o n MRI. Management per neuro.  ESRD:  Recent transition from PD to HD due to radial nerve palsy. Report dialysis is going well. Will plan for HD today per regular schedule.  Hypertension/volume: No volume overload on exam. No UF with HD today to allow permissive hypertension per neurology recommendation.  Anemia: Hgb 8.8. No ESA ordered outpatient, will obtain most recent ESA records from PD unit.  Metabolic bone disease: Calcium controlled. Not on VDRA or binders, follow phos level.   Nutrition:  Hgb 3.0. Start  protein supplements once diet is advanced.  Anice Paganini, PA-C 01/25/2022, 10:04 AM  [facility] Pager: [phone]

## 2022-01-25 NOTE — Evaluation (Signed)
Occupational Therapy Evaluation Patient Details Name: Monica Hunt MRN: [id number] DOB: 1941-05-28 Today's Date: 01/25/2022   History of Present Illness BENELLI WINTHER is a 81 y.o. female admitted under observation 01/24/22 with incoordination in her right hand that started about 2 weeks ago, and some "trouble with words". she had workup with MRI Brain which demonstrated multiple embolic appearing infarcts along with microhemorrhages with the distribution concerning for cerebral amyloid angiopathy. Of note: recently transitioned from peritoneal dialysis to hemodialysis and had a catheter placed. She transitioned 2/2 poor incoordination of her R hand.   Clinical Impression   Pt is typically independent in ADL and mobility. Still driving. Today her speech was clear, and while she reported intermittent blurriness of vision, during time of evaluation her visual assessment tested [facility] (wearing her glasses). Her transfers and mobility were performed at mod I without DME (she was wearing her shoes) biggest defcit lies in her R hand and RUE where she has decreased strength and coordination impacting her ability to perform Bilateral hand tasks - see RUE assessment below. She will benefit from skilled OT while in acute care and post-acute at the outpatient setting and preferably at a neuro clinic (although the Pt and her family may prefer somewhere in [place] closer to home). OT did provide HEP handouts for fine motor exercises and theraputty with handout. OT will continue to follow acutely.      Recommendations for follow up therapy are one component of a multi-disciplinary discharge planning process, led by the attending physician.  Recommendations may be updated based on patient status, additional functional criteria and insurance authorization.   Follow Up Recommendations  Outpatient OT (neuro if possible - does prefer [facility])    Assistance Recommended at Discharge Intermittent Supervision/Assistance   Patient can return home with the following A little help with bathing/dressing/bathroom;Assistance with cooking/housework;Assist for transportation    Functional Status Assessment  Patient has had a recent decline in their functional status and demonstrates the ability to make significant improvements in function in a reasonable and predictable amount of time.  Equipment Recommendations  None recommended by OT    Recommendations for Other Services       Precautions / Restrictions Restrictions Weight Bearing Restrictions: No      Mobility Bed Mobility Overal bed mobility: Modified Independent                  Transfers Overall transfer level: Modified independent                        Balance Overall balance assessment: No apparent balance deficits (not formally assessed)                                         ADL either performed or assessed with clinical judgement   ADL Overall ADL's : Needs assistance/impaired Eating/Feeding: Set up Eating/Feeding Details (indicate cue type and reason): able to utilize her RUE a little top open some containers, but ultimately uses her left hand to feed where she would typically use her right Grooming: Wash/dry hands;Wash/dry face;Standing;Supervision/safety;Oral care Grooming Details (indicate cue type and reason): able to use RUE to assist in wringing out wash cloth and opening toothpaste,but where she would have used RUE as primary, now she is using LUE Upper Body Bathing: Set up   Lower Body Bathing: Set up   Upper  Body Dressing : Supervision/safety Upper Body Dressing Details (indicate cue type and reason): will need assist with buttons/clasps Lower Body Dressing: Min guard;Sitting/lateral leans Lower Body Dressing Details (indicate cue type and reason): extra time needed due to decreased coordination in R hand Toilet Transfer: [place] and  Hygiene: Supervision/safety   Tub/ Shower Transfer: Walk-in shower;Modified independent   Functional mobility during ADLs: Modified independent       Vision Baseline Vision/History: 1 Wears glasses Ability to See in Adequate Light: 0 Adequate Patient Visual Report: No change from baseline;Blurring of vision Vision Assessment?: No apparent visual deficits;Yes Eye Alignment: Within Functional Limits Ocular Range of Motion: Within Functional Limits Alignment/Gaze Preference: Within Defined Limits Tracking/Visual Pursuits: Able to track stimulus in all quads without difficulty Convergence: Within functional limits Visual Fields: No apparent deficits Additional Comments: Pt stated that blurryness has been happnening intermittently for the past 2 weeks. not currently happening     Perception     Praxis      Pertinent Vitals/Pain Pain Assessment Pain Assessment: No/denies pain     Hand Dominance Right   Extremity/Trunk Assessment Upper Extremity Assessment Upper Extremity Assessment: RUE deficits/detail RUE Deficits / Details: decreased strength and fine motor coordination of R hand, no thumb opposition currently, more success in creating fist than isolated finger movement RUE Sensation: WNL RUE Coordination: decreased fine motor;decreased gross motor (fine motor more affected than gross, but using accessory muscles to complete some gross motor skills)   Lower Extremity Assessment Lower Extremity Assessment: Overall WFL for tasks assessed   Cervical / Trunk Assessment Cervical / Trunk Assessment: Normal   Communication Communication Communication: No difficulties   Cognition Arousal/Alertness: Awake/alert Behavior During Therapy: WFL for tasks assessed/performed Overall Cognitive Status: Impaired/Different from baseline Area of Impairment: Safety/judgement                         Safety/Judgement: Decreased awareness of safety     General Comments:  decreased awareness of how deficits will impact her ability to perform certain things (like driving) once discharged     General Comments  daughter present at the end of session    Exercises Exercises: Other exercises Other Exercises Other Exercises: provided with fine motor coordination exercises, and theraputty exercises   Shoulder Instructions      Home Living Family/patient expects to be discharged to:: Private residence Living Arrangements: Alone Available Help at Discharge: Family;Available PRN/intermittently Type of Home: Mobile home Home Access: Stairs to enter Entrance Stairs-Number of Steps: 5-6 Entrance Stairs-Rails: Right;Left;Can reach both Home Layout: One level     Bathroom Shower/Tub: Occupational psychologist: Standard     Home Equipment: None   Additional Comments: still driving      Prior Functioning/Environment Prior Level of Function : Independent/Modified Independent;Driving             Mobility Comments: no DME, independent ADLs Comments: doing all her own ADL, driving to HD 3 days a week        OT Problem List: Decreased strength;Decreased range of motion;Decreased coordination;Impaired UE functional use      OT Treatment/Interventions: Self-care/ADL training;Therapeutic exercise;Neuromuscular education;Patient/family education;Therapeutic activities    OT Goals(Current goals can be found in the care plan section) Acute Rehab OT Goals Patient Stated Goal: get R hand working again OT Goal Formulation: With patient Time For Goal Achievement: 02/08/22 Potential to Achieve Goals: Good ADL Goals Pt Will Perform Eating: with modified  independence;with adaptive utensils Pt Will Perform Grooming: with modified independence;standing Pt Will Perform Upper Body Dressing: with modified independence;sitting;with adaptive equipment Pt Will Perform Lower Body Dressing: with modified independence;sit to/from stand;with adaptive  equipment Pt/caregiver will Perform Home Exercise Program: Increased strength;Increased ROM;Right Upper extremity;With theraputty;Independently;With written HEP provided  OT Frequency: Min 3X/week    Co-evaluation              AM-PAC OT "6 Clicks" Daily Activity     Outcome Measure Help from another person eating meals?: A Little Help from another person taking care of personal grooming?: A Little Help from another person toileting, which includes using toliet, bedpan, or urinal?: A Little Help from another person bathing (including washing, rinsing, drying)?: A Little Help from another person to put on and taking off regular upper body clothing?: A Little Help from another person to put on and taking off regular lower body clothing?: A Little 6 Click Score: 18   End of Session Equipment Utilized During Treatment: Gait belt Nurse Communication: Mobility status  Activity Tolerance: Patient tolerated treatment well Patient left: in bed;with call bell/phone within reach;with family/visitor present  OT Visit Diagnosis: Muscle weakness (generalized) (M62.81);Other symptoms and signs involving the nervous system (R29.898);Hemiplegia and hemiparesis Hemiplegia - Right/Left: Right Hemiplegia - dominant/non-dominant: Dominant Hemiplegia - caused by: Cerebral infarction                Time: 7867-6720 OT Time Calculation (min): 37 min Charges:  OT General Charges $OT Visit: 1 Visit OT Evaluation $OT Eval Moderate Complexity: 1 Mod OT Treatments $Self Care/Home Management : 8-22 mins  Jesse Sans OTR/L Acute Rehabilitation Services Pager: [phone] Office: [place] 01/25/2022, 12:21 PM

## 2022-01-25 NOTE — Progress Notes (Signed)
° °  Echocardiogram 2D Echocardiogram has been performed.  Monica Hunt 01/25/2022, 8:22 AM

## 2022-01-25 NOTE — Discharge Instructions (Signed)
Post procedure wound care instructions (heart monitor) Keep incision clean and dry for 3 days. You can remove outer dressing tomorrow. Leave steri-strips (little pieces of tape) on until seen in the office for wound check appointment. Call the office ([phone]) for redness, drainage, swelling, or fever.  

## 2022-01-25 NOTE — Assessment & Plan Note (Signed)
Continue ropinirole 

## 2022-01-25 NOTE — ED Notes (Signed)
Report given to [facility] in dialysis. Dialysis RN will be ready about an hour at 1214. Communicated that pt has a room 3W01. Will continue to monitor.

## 2022-01-25 NOTE — Progress Notes (Signed)
Speech Language Pathology Treatment: Cognitive-Linquistic  Patient Details Name: Monica Hunt MRN: [id number] DOB: 03-16-1941 Today's Date: 01/25/2022 Time: 8675-4492 SLP Time Calculation (min) (ACUTE ONLY): 15 min  Assessment / Plan / Recommendation Clinical Impression  Pt was seen for cognitive-linguistic treatment. She was alert and cooperative during the session. She was educated regarding the potential impact of the noted cognitive impairments on her independence and she was advised that this SLP recommends that she have her daughter review her pillbox for accuracy. Pt verbalized understanding, but stated that it would not be necessary since she has never required support for this task. She completed a medication management task with 80% accuracy increasing to 100% with min cues. Pt demonstrated adequate recall regarding information regarding discharge planning and cardiology's treatment plan. Following further education regarding the benefits of SLP services in maximizing her independence, pt agreed to at least be evaluated. SLP will continue to follow pt.     HPI HPI: Pt is a 81 y.o. female who presented right hand weakness. MRI Brain demonstrated multiple embolic appearing infarcts along with microhemorrhages with the distribution concerning for cerebral amyloid angiopathy. PMH: syncope, hypertension, diabetes, ESRD on HD, depression, anxiety, anemia, GERD, hyperlipidemia, restless leg syndrome, hypothyroidism      SLP Plan  Continue with current plan of care  Patient needs continued Speech Lanaguage Pathology Services   Recommendations for follow up therapy are one component of a multi-disciplinary discharge planning process, led by the attending physician.  Recommendations may be updated based on patient status, additional functional criteria and insurance authorization.    Recommendations                   Follow Up Recommendations: Outpatient SLP Assistance recommended at  discharge: Intermittent Supervision/Assistance SLP Visit Diagnosis: Cognitive communication deficit (E10.071) Plan: Continue with current plan of care          Monica Hunt I. Hardin Negus, [place], [place] Office number [phone] Pager [place]  01/25/2022, 3:46 PM

## 2022-01-25 NOTE — ED Notes (Signed)
Consent for hemodialysis signed by pt and witnessed by this RN. Consent at bedside.

## 2022-01-25 NOTE — Care Management Obs Status (Signed)
[place] NOTIFICATION   Patient Details  Name: Monica Hunt MRN: [id number] Date of Birth: 01-25-1941   Medicare Observation Status Notification Given:  Yes    Pollie Friar, RN 01/25/2022, 3:55 PM

## 2022-01-25 NOTE — Progress Notes (Addendum)
STROKE TEAM PROGRESS NOTE   SUBJECTIVE (INTERVAL HISTORY) Her daughter and daughter's partner are at the bedside.  Overall her condition is unchanged. Patient had the onset of R hand weakness 2 weeks ago, and her daughter scheduled a neurologist appointment. She was seen by Dr. Manuella Ghazi at [facility] yesterday, who then advised patient to come to the ED for stroke work-up. Patient denies further weakness in other extremities, aphasia, dysarthria, and facial asymmetry. MRI shows multiple bilateral cerebral hemispheric infarcts occurring at different times, L frontal lobe infarct involving precentral gyrus (all in the distribution of bilateral MCA), as well as numerous micro-hemorrhages of the cerebellar and cerebral hemispheres c/w cerebral amyloid angiopathy. Patient denies any history is of palpitations or cardiac arrhythmia but did have one episode of syncopal event a few years ago which she thought was related to low sugar.  She recently had some Zio patch earlier this month for 2 weeks which was negative.  OBJECTIVE Temp:  [98 F (36.7 C)-98.2 F (36.8 C)] 98.1 F (36.7 C) (02/01 1151) Pulse Rate:  [60-94] 69 (02/01 1145) Resp:  [12-22] 16 (02/01 1145) BP: (106-147)/(52-100) 106/87 (02/01 1145) SpO2:  [96 %-100 %] 98 % (02/01 1145) Weight:  [57.6 kg] 57.6 kg (01/31 1635)  Recent Labs  Lab 01/24/22 1644 01/24/22 2353 01/25/22 0742  GLUCAP 103* 148* 98   Recent Labs  Lab 01/24/22 1405 01/24/22 1420  NA 140 139  K 4.5 4.5  CL 103 104  CO2 27  --   GLUCOSE 107* 106*  BUN 16 19  CREATININE 4.00* 4.50*  CALCIUM 8.8*  --    Recent Labs  Lab 01/24/22 1405  AST 24  ALT 24  ALKPHOS 106  BILITOT 0.5  PROT 6.2*  ALBUMIN 3.0*   Recent Labs  Lab 01/24/22 1405 01/24/22 1420  WBC 5.3  --   NEUTROABS 3.4  --   HGB 8.8* 8.8*  HCT 28.3* 26.0*  MCV 100.7*  --   PLT 164  --    No results for input(s): CKTOTAL, CKMB, CKMBINDEX, TROPONINI in the last 168 hours. Recent Labs     01/24/22 1405  LABPROT 13.4  INR 1.0   No results for input(s): COLORURINE, LABSPEC, PHURINE, GLUCOSEU, HGBUR, BILIRUBINUR, KETONESUR, PROTEINUR, UROBILINOGEN, NITRITE, LEUKOCYTESUR in the last 72 hours.  Invalid input(s): APPERANCEUR     Component Value Date/Time   CHOL 126 01/25/2022 0650   TRIG 217 (H) 01/25/2022 0650   HDL 30 (L) 01/25/2022 0650   CHOLHDL 4.2 01/25/2022 0650   VLDL 43 (H) 01/25/2022 0650   LDLCALC 53 01/25/2022 0650   Lab Results  Component Value Date   HGBA1C 4.9 01/25/2022   No results found for: LABOPIA, COCAINSCRNUR, LABBENZ, AMPHETMU, THCU, LABBARB  No results for input(s): ETH in the last 168 hours.  I have personally reviewed the radiological images below and agree with the radiology interpretations.  MR ANGIO HEAD WO CONTRAST  Result Date: 01/25/2022 CLINICAL DATA:  81 year old female with several scattered acute infarcts on brain MRI yesterday, evidence of amyloid angiopathy. EXAM: MRA HEAD WITHOUT CONTRAST TECHNIQUE: Angiographic images of the Circle of Willis were acquired using MRA technique without intravenous contrast. COMPARISON:  Brain MRI 01/24/2022. FINDINGS: Anterior circulation: Antegrade flow in both ICA siphons with mild ICA siphon irregularity and dolichoectasia. Mild stenosis at the right anterior genu. Normal ophthalmic artery origins. Patent carotid termini. Patent MCA and ACA origins. There is a mild irregularity and stenosis in the left ACA distal A2 segment. Other  visible ACA branches are within normal limits. MCA M1 segments and bifurcations are patent without stenosis. No MCA branch occlusion identified. No significant MCA branch stenosis. Posterior circulation: Antegrade flow in the posterior circulation with mildly dominant distal left vertebral artery. No distal vertebral stenosis. Both PICA origins are patent. Patent vertebrobasilar junction and basilar artery without stenosis. Patent basilar tip, SCA and PCA origins. Posterior  communicating arteries are diminutive or absent. No PCA branch occlusion is identified, but there is moderate irregularity of the bilateral P3 branches, with associated stenosis greater on the left side. Anatomic variants: Mildly dominant left vertebral artery. Other: Widespread susceptibility artifact related to amyloid angiopathy. IMPRESSION: 1. No circle-of-Willis branch occlusion identified. 2. Intracranial atherosclerosis with moderate irregularity and stenosis of the PCA P3 segments, greater on the Left. Mild stenosis in the anterior circulation at the right ICA anterior genu and distal left ACA A2. 3. Susceptibility artifact related to Amyloid Angiopathy. Electronically Signed   By: Genevie Ann M.D.   On: 01/25/2022 04:51   MR BRAIN WO CONTRAST  Result Date: 01/24/2022 CLINICAL DATA:  Loss of dexterity in right hand EXAM: MRI HEAD WITHOUT CONTRAST TECHNIQUE: Multiplanar, multiecho pulse sequences of the brain and surrounding structures were obtained without intravenous contrast. COMPARISON:  None. FINDINGS: Brain: There are scattered infarcts in both cerebral and cerebellar hemispheres, some of which appear acute while others appear more subacute. The largest infarct is in the left of the frontal lobe with involvement precentral gyrus and likely accounts for the patient's symptoms. Additional acute to subacute infarcts are seen in the right superior frontal gyrus, bilateral temporoparietal periventricular white matter and right occipital lobe. There is no significant hemorrhage associated with these infarcts. There is no acute extra-axial fluid collection. There is a background of mild global parenchymal volume loss with prominence of the ventricular system and extra-axial CSF spaces. There is extensive confluent FLAIR signal abnormality throughout the subcortical and periventricular white matter likely reflecting sequela of advanced chronic white matter microangiopathy. There is an additional remote infarct  in the left cerebellar hemisphere and small remote infarct in the left corona radiata. There are extensive punctate chronic microhemorrhages throughout the cerebral and cerebellar hemispheres which are predominantly peripheral in distribution. There is no mass lesion.  There is no midline shift. Vascular: Normal flow voids. Skull and upper cervical spine: Normal marrow signal. Sinuses/Orbits: The paranasal sinuses are clear. Bilateral lens implants are in place. The globes and orbits are otherwise unremarkable. Other: There is a left mastoid effusion. IMPRESSION: 1. Scattered infarcts in the bilateral cerebral hemispheres as detailed above, some of which appear acute while others appear more subacute. The largest infarct is in the left frontal lobe with involvement of the precentral gyrus, likely accounting for the patient's reported symptoms. Consider embolic etiology given involvement of multiple vascular territories. 2. Background of mild global parenchymal volume loss and advanced chronic white matter microangiopathy. Small remote infarcts in the left corona radiata and left cerebellar hemisphere. 3. Extensive punctate chronic microhemorrhages throughout the cerebral and cerebellar hemispheres predominantly peripheral in distribution concerning for cerebral amyloid angiopathy. Electronically Signed   By: Valetta Mole M.D.   On: 01/24/2022 16:41   MR Cervical Spine Wo Contrast  Result Date: 01/24/2022 CLINICAL DATA:  Loss of dexterity in right hand starting 01/09/2022 EXAM: MRI CERVICAL SPINE WITHOUT CONTRAST TECHNIQUE: Multiplanar, multisequence MR imaging of the cervical spine was performed. No intravenous contrast was administered. COMPARISON:  None. FINDINGS: There is intermittent motion degradation. The sagittal T1 images  are mildly motion degraded, and the axial images are moderately motion degraded. Within this confine: Alignment: Normal. Vertebrae: Vertebral body heights are preserved. There is no  suspicious marrow signal abnormality or marrow edema. Cord: Normal in signal and morphology, within the confines of motion degraded images. Posterior Fossa, vertebral arteries, paraspinal tissues: The posterior fossa is assessed on the separately dictated brain MRI. The vertebral artery flow voids are present. The paraspinal soft tissues are unremarkable. Disc levels: The disc heights are overall preserved. C2-C3: No significant spinal canal or neural foraminal stenosis C3-C4: No significant spinal canal or neural foraminal stenosis C4-C5: There is left worse than right facet arthropathy with prominent left-sided osteophytosis/ligamentum flavum thickening (9-12). Findings result in mild spinal canal stenosis with slight rightward displacement of the cervical cord but no evidence of frank cord compression or signal abnormality. There is mild left and no significant right neural foraminal stenosis. C5-C6: Mild uncovertebral and facet arthropathy resulting in mild left and no significant right neural foraminal stenosis and no significant spinal canal stenosis. C6-C7: No significant spinal canal or neural foraminal stenosis. C7-T1: No significant spinal canal or neural foraminal stenosis. IMPRESSION: 1. Left worse than right facet arthropathy with prominent left osteophytosis/ligamentum flavum thickening resulting in mild spinal canal stenosis with slight rightward displacement of the cord but no frank cord compression or definite cord signal abnormality. 2. Otherwise, minimal for age degenerative changes in the cervical spine without other high-grade spinal canal or neural foraminal stenosis. Electronically Signed   By: Valetta Mole M.D.   On: 01/24/2022 16:18   ECHOCARDIOGRAM COMPLETE  Result Date: 01/25/2022    ECHOCARDIOGRAM REPORT   Patient Name:   JANASHA BARKALOW Date of Exam: 01/25/2022 Medical Rec #:  [id number]    Height:       62.0 in Accession #:    [id number]   Weight:       127.0 lb Date of Birth:  1941/01/15     BSA:          1.576 m Patient Age:    73 years     BP:           138/67 mmHg Patient Gender: F            HR:           67 bpm. Exam Location:  Inpatient Procedure: 2D Echo, Cardiac Doppler and Color Doppler Indications:    I63.9 STROKE  History:        Patient has prior history of Echocardiogram examinations, most                 recent 12/14/2021. Risk Factors:Diabetes and Hypertension.  Sonographer:    Beryle Beams Referring Phys: [id number] [place]  1. Left ventricular ejection fraction, by estimation, is 55 to 60%. The left ventricle has normal function. The left ventricle has no regional wall motion abnormalities. There is mild concentric left ventricular hypertrophy. Left ventricular diastolic parameters are consistent with Grade I diastolic dysfunction (impaired relaxation).  2. Right ventricular systolic function is normal. The right ventricular size is normal. There is normal pulmonary artery systolic pressure.  3. The mitral valve is normal in structure. Trivial mitral valve regurgitation. No evidence of mitral stenosis.  4. Tricuspid valve regurgitation is mild to moderate.  5. The aortic valve is normal in structure. Aortic valve regurgitation is not visualized. Aortic valve sclerosis/calcification is present, without any evidence of aortic stenosis.  6. The inferior vena cava is normal in  size with greater than 50% respiratory variability, suggesting right atrial pressure of 3 mmHg. Comparison(s): No significant change from prior study. FINDINGS  Left Ventricle: Left ventricular ejection fraction, by estimation, is 55 to 60%. The left ventricle has normal function. The left ventricle has no regional wall motion abnormalities. The left ventricular internal cavity size was normal in size. There is  mild concentric left ventricular hypertrophy. Left ventricular diastolic parameters are consistent with Grade I diastolic dysfunction (impaired relaxation). Right Ventricle: The right  ventricular size is normal. No increase in right ventricular wall thickness. Right ventricular systolic function is normal. There is normal pulmonary artery systolic pressure. The tricuspid regurgitant velocity is 2.81 m/s, and  with an assumed right atrial pressure of 3 mmHg, the estimated right ventricular systolic pressure is 61.4 mmHg. Left Atrium: Left atrial size was normal in size. Right Atrium: Right atrial size was normal in size. Pericardium: There is no evidence of pericardial effusion. Presence of epicardial fat layer. Mitral Valve: The mitral valve is normal in structure. Trivial mitral valve regurgitation. No evidence of mitral valve stenosis. MV peak gradient, 118.8 mmHg. The mean mitral valve gradient is 87.0 mmHg. Tricuspid Valve: The tricuspid valve is normal in structure. Tricuspid valve regurgitation is mild to moderate. No evidence of tricuspid stenosis. Aortic Valve: The aortic valve is normal in structure. Aortic valve regurgitation is not visualized. Aortic valve sclerosis/calcification is present, without any evidence of aortic stenosis. Aortic valve mean gradient measures 8.0 mmHg. Aortic valve peak  gradient measures 14.6 mmHg. Aortic valve area, by VTI measures 1.07 cm. Pulmonic Valve: The pulmonic valve was normal in structure. Pulmonic valve regurgitation is trivial. No evidence of pulmonic stenosis. Aorta: The aortic root is normal in size and structure. Venous: The inferior vena cava is normal in size with greater than 50% respiratory variability, suggesting right atrial pressure of 3 mmHg. IAS/Shunts: No atrial level shunt detected by color flow Doppler.  LEFT VENTRICLE PLAX 2D LVIDd:         4.10 cm     Diastology LVIDs:         2.40 cm     LV e' medial:    7.94 cm/s LV PW:         1.20 cm     LV E/e' medial:  14.4 LV IVS:        1.00 cm     LV e' lateral:   10.60 cm/s LVOT diam:     1.60 cm     LV E/e' lateral: 10.8 LV SV:         50 LV SV Index:   32 LVOT Area:     2.01 cm  LV  Volumes (MOD) LV vol d, MOD A2C: 44.6 ml LV vol d, MOD A4C: 59.7 ml LV vol s, MOD A2C: 13.0 ml LV vol s, MOD A4C: 22.9 ml LV SV MOD A2C:     31.6 ml LV SV MOD A4C:     59.7 ml LV SV MOD BP:      36.1 ml RIGHT VENTRICLE             IVC RV S prime:     10.10 cm/s  IVC diam: 1.40 cm TAPSE (M-mode): 1.8 cm LEFT ATRIUM             Index        RIGHT ATRIUM           Index LA diam:  3.30 cm 2.09 cm/m   RA Area:     12.20 cm LA Vol (A2C):   51.4 ml 32.61 ml/m  RA Volume:   31.40 ml  19.92 ml/m LA Vol (A4C):   32.6 ml 20.68 ml/m LA Biplane Vol: 41.0 ml 26.01 ml/m  AORTIC VALVE                     PULMONIC VALVE AV Area (Vmax):    1.20 cm      PV Vmax:       0.61 m/s AV Area (Vmean):   1.16 cm      PV Vmean:      40.300 cm/s AV Area (VTI):     1.07 cm      PV VTI:        0.130 m AV Vmax:           191.00 cm/s   PV Peak grad:  1.5 mmHg AV Vmean:          135.000 cm/s  PV Mean grad:  1.0 mmHg AV VTI:            0.467 m AV Peak Grad:      14.6 mmHg AV Mean Grad:      8.0 mmHg LVOT Vmax:         114.00 cm/s LVOT Vmean:        77.700 cm/s LVOT VTI:          0.248 m LVOT/AV VTI ratio: 0.53  AORTA Ao Root diam: 2.40 cm Ao Asc diam:  2.50 cm MITRAL VALVE                TRICUSPID VALVE MV Area (PHT): 3.72 cm     TV Peak grad:   26.4 mmHg MV Area VTI:   0.25 cm     TV Mean grad:   19.0 mmHg MV Peak grad:  118.8 mmHg   TV Vmax:        2.57 m/s MV Mean grad:  87.0 mmHg    TV Vmean:       210.0 cm/s MV Vmax:       5.45 m/s     TV VTI:         0.78 msec MV Vmean:      447.0 cm/s   TR Peak grad:   31.6 mmHg MV Decel Time: 204 msec     TR Vmax:        281.00 cm/s MV E velocity: 114.00 cm/s MV A velocity: 111.00 cm/s  SHUNTS MV E/A ratio:  1.03         Systemic VTI:  0.25 m                             Systemic Diam: 1.60 cm Kardie Tobb DO Electronically signed by Berniece Salines DO Signature Date/Time: 01/25/2022/10:23:17 AM    Final    LONG TERM MONITOR (3-14 DAYS)  Result Date: 01/06/2022  The dominant rhythm was normal  sinus with normal circadian variation.  There are no meaningful episodes of bradycardia or pauses.  There is no atrial fibrillation.  Multiple episodes of nonsustained as well as sustained but brief supraventricular tachycardia (probably ectopic atrial tachycardia) are seen. The longest episode lasted for 18 seconds. These events occur both during daytime hours and the expected sleep hours.  Also seen are episodes of brief wide-complex tachycardia. While many of these represent SVT  with aberrant conduction, some are true episodes of nonsustained VT lasting for 6-7 beats.  Relation between the patient's symptoms and the episodes of supraventricular tachycardia.  Abnormal event monitor due to the occurrence of relatively frequent episodes of nonsustained and briefly sustained ectopic atrial tachycardia which seem to correlate with the patient's symptoms.  The longest episode duration was 18 seconds. Atrial fibrillation was not recorded. Also noted are 2 very brief episodes of nonsustained ventricular tachycardia, maximum 7 beats. Patch Wear Time:  12 days and 16 hours (2022-12-27T05:24:14-0500 to 2023-01-08T21:46:31-0500) Patient had a min HR of 48 bpm, max HR of 231 bpm, and avg HR of 93 bpm. Predominant underlying rhythm was Sinus Rhythm. 2 Ventricular Tachycardia runs occurred, the run with the fastest interval lasting 7 beats with a max rate of 231 bpm, the longest lasting 6 beats with an avg rate of 161 bpm. Some episodes of Ventricular Tachycardia conducted with possible aberrancy. 33 Supraventricular Tachycardia runs occurred, the run with the fastest interval lasting 13 beats with a max rate of 226 bpm, the longest lasting 18.2 secs with an avg rate of 134 bpm. Isolated SVEs were occasional (1.0%, 15902), SVE Couplets were rare (<1.0%, 587), and SVE Triplets were rare (<1.0%, 45). Isolated VEs were rare (<1.0%), and no VE Couplets or VE Triplets were present. Inverted QRS complexes possibly due to  inverted placement of device.   VAS US CAROTID  Result Date: 01/25/2022 Carotid Arterial Duplex Study Patient Name:  ANDRAYA FRIGON  Date of Exam:   01/25/2022 Medical Rec #: [id number]     Accession #:    [id number] Date of Birth: 1941/06/09     Patient Gender: F Patient Age:   64 years Exam Location:  [facility] Procedure:      VAS US CAROTID Referring Phys: Sheppard Coil MELVIN --------------------------------------------------------------------------------  Indications:       CVA. Risk Factors:      Hypertension, Diabetes. Comparison Study:  no prior Performing Technologist: Archie Patten RVS  Examination Guidelines: A complete evaluation includes B-mode imaging, spectral Doppler, color Doppler, and power Doppler as needed of all accessible portions of each vessel. Bilateral testing is considered an integral part of a complete examination. Limited examinations for reoccurring indications may be performed as noted.  Right Carotid Findings: +----------+--------+--------+--------+------------------+--------+             PSV cm/s EDV cm/s Stenosis Plaque Description Comments  +----------+--------+--------+--------+------------------+--------+  CCA Prox   67       11                heterogenous                 +----------+--------+--------+--------+------------------+--------+  CCA Distal 63       14                heterogenous                 +----------+--------+--------+--------+------------------+--------+  ICA Prox   75       23       1-39%    heterogenous                 +----------+--------+--------+--------+------------------+--------+  ICA Distal 81       30                                             +----------+--------+--------+--------+------------------+--------+  ECA        132      16                                             +----------+--------+--------+--------+------------------+--------+ +----------+--------+-------+--------+-------------------+             PSV cm/s EDV cms Describe Arm  Pressure (mmHG)  +----------+--------+-------+--------+-------------------+  Subclavian 69                                             +----------+--------+-------+--------+-------------------+ +---------+--------+--+--------+--+---------+  Vertebral PSV cm/s 45 EDV cm/s 11 Antegrade  +---------+--------+--+--------+--+---------+  Left Carotid Findings: +----------+--------+--------+--------+-------------------------+--------+             PSV cm/s EDV cm/s Stenosis Plaque Description        Comments  +----------+--------+--------+--------+-------------------------+--------+  CCA Prox   70       16                heterogenous                        +----------+--------+--------+--------+-------------------------+--------+  CCA Distal 62       13                heterogenous                        +----------+--------+--------+--------+-------------------------+--------+  ICA Prox   73       23       1-39%    heterogenous and calcific           +----------+--------+--------+--------+-------------------------+--------+  ICA Distal 150      38                                                    +----------+--------+--------+--------+-------------------------+--------+  ECA        155      19                                                    +----------+--------+--------+--------+-------------------------+--------+ +----------+--------+--------+--------+-------------------+             PSV cm/s EDV cm/s Describe Arm Pressure (mmHG)  +----------+--------+--------+--------+-------------------+  Subclavian 78                                              +----------+--------+--------+--------+-------------------+ +---------+--------+--+--------+--+---------+  Vertebral PSV cm/s 58 EDV cm/s 13 Antegrade  +---------+--------+--+--------+--+---------+   Summary: Right Carotid: Velocities in the right ICA are consistent with a 1-39% stenosis. Left Carotid: Velocities in the left ICA are consistent with a 1-39% stenosis.  Vertebrals: Bilateral vertebral arteries demonstrate antegrade flow. *See table(s) above for measurements and observations.     Preliminary     Carotid Doppler: Mild stenosis of bilateral ICA.   PHYSICAL EXAM  Temp:  [  98 F (36.7 C)-98.2 F (36.8 C)] 98.1 F (36.7 C) (02/01 1151) Pulse Rate:  [60-94] 69 (02/01 1145) Resp:  [12-22] 16 (02/01 1145) BP: (106-147)/(52-100) 106/87 (02/01 1145) SpO2:  [96 %-100 %] 98 % (02/01 1145) Weight:  [57.6 kg] 57.6 kg (01/31 1635)  General - Well nourished, well developed, in no apparent distress.  Cardiovascular - Regular rhythm and rate.  Mental Status -  Level of arousal and orientation to time, place, and person were intact. Language including expression, naming, repetition, comprehension was assessed and found intact. Attention span and concentration were normal. Recent and remote memory were intact. Fund of Knowledge was assessed and was largely intact; minor mathematical difficulties.  Cranial Nerves II - XII - II - Visual field intact O. III, IV, VI - Extraocular movements intact. V - Facial sensation intact bilaterally. VII - Facial movement intact bilaterally. VIII - Hearing & vestibular intact bilaterally. X - Palate elevates symmetrically. XI - Chin turning & shoulder shrug intact bilaterally. XII - Tongue protrusion intact.  Motor Strength - The patients strength was normal in LUE and BLE and pronator drift was absent. Strength was 3/5 in all muscle groups of RUE (biceps, triceps, wrist flexors and extensors, hand intrinsic and extrinsic muscles). Bulk was decreased in RUE and normal in LUE and BLE, and fasciculations were absent.  Diminished fine finger movements on the right.  Orbits left over right upper extremity.  Right grip is weak. Motor Tone - Muscle tone was assessed at the neck and appendages and was normal.  Sensory - Light touch was assessed and symmetrical.    Coordination - The patient had normal movements in  the hands and feet with no ataxia or dysmetria.  Tremor was absent.  Gait and Station - deferred.   ASSESSMENT/PLAN Ms. Monica Hunt is a 81 y.o. female with history of HTN, T2DM c/b ESRD on HD and polyneuropathy, GERD, hyperthyroidism, vasovagal syncope, and HLD admitted for R hand weakness x 2 weeks. No tPA given due to outside of time window.    Stroke:  bilateral MCA infarct embolic secondary to suspected cardioembolic  source MRI  bilateral MCA infarcts of different times; multiple microhemorrhages, c/w cerebral amyloid angiopathy MRA  Mild stenosis of R ICA and PCA, L>R Carotid Doppler  Mild stenosis of bilateral ICA 2D Echo  LVEF 55-60%, mild concentric LVH, Grade I diastolic dysfunction, Trivial MV regurg, mild-mod TV regurg,   LDL 53 HgbA1c 4.9% Diet Order             Diet Heart Room service appropriate? Yes; Fluid consistency: Thin  Diet effective now                   No antithrombotic prior to admission, now on aspirin 81 mg daily.  Will not do dual antiplatelet therapy due to his underlying amyloid angiopathy and multiple microhemorrhages on MRI on SWI images Patient counseled to be compliant with her antithrombotic medications Ongoing aggressive stroke risk factor management Therapy recommendations:  Outpatient OT Disposition:  Home, pending workup completion  Diabetes HgbA1c 4.9; goal < 7.0 Controlled Currently on SSI; home Glipizide 2.5 mg CBG monitoring DM education and close PCP follow up  Hypertension Stable Permissive hypertension (OK if <220/120) for 24-48 hours post stroke and then gradually normalized within 5-7 days. Long term BP goal normotensive  Hyperlipidemia Home meds:  Atorvastatin 40 mg  LDL 53, goal < 70 Continue statin at discharge  Other Stroke Risk Factors Advanced age Cigarette smoker,  advised to stop smoking  Other Active Problems none  Hospital day # 0    Rosezetta Schlatter, MD Stroke Neurology- Neuro Psych  Resident 01/25/2022 12:10 PM  I have personally obtained history,examined this patient, reviewed notes, independently viewed imaging studies, participated in medical decision making and plan of care.ROS completed by me personally and pertinent positives fully documented  I have made any additions or clarifications directly to the above note. Agree with note above.  Patient presented with subacute painless right hand weakness for couple of weeks and MRI shows by cerebral embolic infarcts.  Strong suspicion for underlying paroxysmal A. fib but abnormal MRI scan showing multiple microhemorrhages on SWI images raises strong possibility of underlying amyloid angiopathy or hypertensive microangiopathy which makes patient's high risk for intracerebral hemorrhage on strong anticoagulants.  Recommend prolonged cardiac monitoring for A. fib but she may not be the best candidate for long-term anticoagulation but given the fact that she is pretty independent and manages well on the face may consider watchman device and short-term anticoagulation if A. fib is found.  Recommend aspirin 81 mg daily alone for now and avoid dual antiplatelet therapy due to significant microhemorrhages on MRI.  Long discussion with patient and daughter at the bedside and with Dr. Thurmond Butts.  Greater than 50% time during this 50-minute visit was spent on counseling and coordination of care and discussion with patient and care team and answering questions.  Antony Contras, MD Medical Director [facility] Pager: [phone] 01/25/2022 2:34 PM   To contact Stroke Continuity provider, please refer to [email or web address] After hours, contact General Neurology

## 2022-01-25 NOTE — Progress Notes (Signed)
Progress Note   Patient: Monica Hunt ERX:[id number] DOB: [birth year] DOA: 01/24/2022     0 DOS: the patient was seen and examined on 01/25/2022   Brief hospital course: Monica Hunt is a 81 y.o. female with a history of ESRD recently changed from PD to HD MWF, HTN, T2DM, depression, anxiety, HLD, GERD, RLS, hypothyroidism who presented to the ED on the advice of neurology on 1/31 due to 2-3 weeks of right hand weakness. She noticed right (dominant) hand weakness/loss of dexterity several weeks ago which was debilitating enough to have a right subclavian HD catheter placed to transition from PD to HD. She was evaluated by neurology who referred her to [facility] for stroke evaluation. Brain MRI showed scattered infarcts of varying chronicity within the bilateral MCA distributions. There was microhemorrhages concerning for cerebral amyloid angiopathy. She was admitted for full stroke work up which included implantable loop recorder.  Assessment and Plan: * Acute CVA- (present on admission) Concern for embolic etiology with bilateral cortical distribution. Note symptoms preceded, and were cause for, HD catheter placement and no RUE edema to suggest DVT at this time. - Aspirin 81mg  daily currently recommended by neurology, d/w Dr. Leonie Hunt. Caution with microhemorrhages, avoiding DAPT. - ILR placement (01/25/2022) pursued per neurology recommendations. Rationale is that a Watchman palcement would mitigate her risk for stroke were atrial fibrillation to be detected (with signs suggestive of cerebral amyloid angiopathy/microhemorrhages, long term anticoagulation is presents prohibitive risk. Note recent Zio patch recorded 12 days and 16 hours of data with no atrial fibrillation detected.  End stage renal failure on dialysis [facility]) Routine HD (last was 1/30) is due today. Nephrology notified, though no urgent HD needs. Plan for HD today, though was bumped due to acute cases, planned for 2/2 at 6am.  Hyperthyroidism-  (present on admission) Continue methimazole  Restless legs- (present on admission) Continue ropinirole  HLD (hyperlipidemia)- (present on admission) Continue statin. LDL at 53 is at goal.   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 Continue SSI. Will consider holding Monica Hunt at discharge if pt has had hypoglycemic events.   Subjective: Right hand apraxia is stable, weak in fingers worse than wrist worse than elbow. Unchanged, constant. Speech is, per patient, at her normal.   Physical Exam: Vitals:   01/25/22 1145 01/25/22 1151 01/25/22 1211 01/25/22 1520  BP: 106/87  119/85 110/68  Pulse: 69  68 66  Resp: 16  16 18   Temp:  98.1 F (36.7 C) 98.1 F (36.7 C) 98.2 F (36.8 C)  TempSrc:  Oral Oral Oral  SpO2: 98%  99%   Weight:      Height:       Gen: 81 y.o. female in no distress Pulm: Nonlabored breathing room air. Clear. CV: Regular rate and rhythm. No murmur, rub, or gallop. No JVD, no dependent edema. GI: Abdomen soft, non-tender, non-distended, with normoactive bowel sounds.  Ext: Warm, no deformities Skin: No rashes, lesions or ulcers on visualized skin. [facility] HD catheter c/d/I without erythema or tenderness. RUE without diffuse edema.  Neuro: Alert and oriented. Right hand weakness and apraxia. Some delayed but still accurate cognitive processing. Some stuttering speech without other focal neurological deficits. Psych: Judgement and insight appear fair. Mood euthymic & affect congruent. Behavior is appropriate.    Data Reviewed: Echo: LVEF 55-60%,  G1DD /mild concentric LVH, no RWMA. RV and PASP estimates normal. No CES.    Family Communication: None at bedside  Disposition: Status is: Inpatient Remains inpatient appropriate because:  Requires HD off schedule.   Planned Discharge Destination: Home  Monica Pour, MD 01/25/2022 5:56 PM  For on call review [email or web address].

## 2022-01-25 NOTE — Assessment & Plan Note (Addendum)
Concern for embolic etiology with bilateral cortical distribution. Note symptoms preceded, and were cause for, HD catheter placement and no RUE edema to suggest DVT at this time. - Aspirin 81mg  daily currently recommended by neurology, d/w Dr. Leonie Man. Caution with microhemorrhages, avoiding DAPT. - ILR placement (01/25/2022) pursued per neurology recommendations. Rationale is that a Watchman palcement would mitigate her risk for stroke were atrial fibrillation to be detected (with signs suggestive of cerebral amyloid angiopathy/microhemorrhages, long term anticoagulation is presents prohibitive risk. Note recent Zio patch recorded 12 days and 16 hours of data with no atrial fibrillation detected.

## 2022-01-26 ENCOUNTER — Encounter (HOSPITAL_COMMUNITY): Payer: Self-pay | Admitting: Cardiology

## 2022-01-26 LAB — RENAL FUNCTION PANEL
Albumin: 2.7 g/dL — ABNORMAL LOW (ref 3.5–5.0)
Anion gap: 11 (ref 5–15)
BUN: 42 mg/dL — ABNORMAL HIGH (ref 8–23)
CO2: 26 mmol/L (ref 22–32)
Calcium: 8.3 mg/dL — ABNORMAL LOW (ref 8.9–10.3)
Chloride: 103 mmol/L (ref 98–111)
Creatinine, Ser: 6.66 mg/dL — ABNORMAL HIGH (ref 0.44–1.00)
GFR, Estimated: 6 mL/min — ABNORMAL LOW (ref >60)
Glucose, Bld: 87 mg/dL (ref 70–99)
Phosphorus: 1.7 mg/dL — ABNORMAL LOW (ref 2.5–4.6)
Potassium: 5.7 mmol/L — ABNORMAL HIGH (ref 3.5–5.1)
Sodium: 140 mmol/L (ref 135–145)

## 2022-01-26 LAB — HEPATITIS B SURFACE ANTIGEN: Hepatitis B Surface Ag: NONREACTIVE

## 2022-01-26 LAB — CBC
HCT: 24.2 % — ABNORMAL LOW (ref 36.0–46.0)
Hemoglobin: 7.9 g/dL — ABNORMAL LOW (ref 12.0–15.0)
MCH: 32.2 pg (ref 26.0–34.0)
MCHC: 32.6 g/dL (ref 30.0–36.0)
MCV: 98.8 fL (ref 80.0–100.0)
Platelets: 156 10*3/uL (ref 150–400)
RBC: 2.45 MIL/uL — ABNORMAL LOW (ref 3.87–5.11)
RDW: 14.6 % (ref 11.5–15.5)
WBC: 5.9 10*3/uL (ref 4.0–10.5)
nRBC: 0 % (ref 0.0–0.2)

## 2022-01-26 LAB — HEPATITIS B SURFACE ANTIBODY,QUALITATIVE: Hep B S Ab: REACTIVE — AB

## 2022-01-26 LAB — HEPATITIS B CORE ANTIBODY, IGM: Hep B C IgM: NONREACTIVE

## 2022-01-26 LAB — GLUCOSE, CAPILLARY
Glucose-Capillary: 87 mg/dL (ref 70–99)
Glucose-Capillary: 92 mg/dL (ref 70–99)

## 2022-01-26 MED ORDER — ASPIRIN EC 81 MG PO TBEC: 81.0000 mg | DELAYED_RELEASE_TABLET | Freq: Every day | ORAL | 11 refills | Status: DC

## 2022-01-26 MED ORDER — HEPARIN SODIUM (PORCINE) 1000 UNIT/ML IJ SOLN
1000.0000 [IU] | INTRAMUSCULAR | Status: DC | PRN
  Administered 2022-01-26: 3200 [IU] via INTRAVENOUS
  Filled 2022-01-26 (×2): qty 1

## 2022-01-26 NOTE — TOC Transition Note (Signed)
Transition of Care [facility]) - CM/SW Discharge Note   Patient Details  Name: Monica Hunt MRN: [id number] Date of Birth: 1941/01/30  [facility] CM/SW Contact:  Pollie Friar, RN Phone Number: 01/26/2022, 11:22 AM   Clinical Narrative:    Patient is discharging home with outpatient therapy arranged through [place]. Information on the AVS. Pt states her friend and daughter can provide intermittent supervision. Pt has transportation home.    Final next level of care: OP Rehab Barriers to Discharge: No Barriers Identified   Patient Goals and CMS Choice     Choice offered to / list presented to : Patient  Discharge Placement                       Discharge Plan and Services   Discharge Planning Services: CM Consult                                 Social Determinants of Health (SDOH) Interventions     Readmission Risk Interventions No flowsheet data found.

## 2022-01-26 NOTE — Procedures (Signed)
Patient seen on Hemodialysis. BP 106/62    Pulse 71    Temp 97.9 F (36.6 C) (Oral)    Resp 17    Ht 5\' 2"  (1.575 m)    Wt 57.3 kg    SpO2 96%    BMI 23.10 kg/m   QB 400, UF goal 0- keeping even Tolerating treatment without complaints at this time.   Elmarie Shiley MD [facility]. Office # [phone] Pager # [phone] 8:51 AM

## 2022-01-26 NOTE — Discharge Summary (Signed)
Physician Discharge Summary   Patient: Monica Hunt MRN: [id number] DOB: Apr 22, 1941  Admit date:     01/24/2022  Discharge date: 01/26/22  Discharge Physician: [name]   PCP: Lavone Orn, MD   Recommendations at discharge:  Follow up with neurology in 6-8 weeks Follow up with cardiology/EP for wound recheck Continue routine HD  Discharge Diagnoses: Principal Problem:   Acute CVA Active Problems:   End stage renal failure on dialysis ([place])   Controlled type 2 diabetes mellitus with kidney complication, without long-term current use of insulin (HCC)   Essential hypertension   Anxiety   Anemia in chronic kidney disease   Gastroesophageal reflux disease   HLD (hyperlipidemia)   Restless legs   Hyperthyroidism  Hospital Course: Monica Hunt is a 81 y.o. female with a history of ESRD recently changed from PD to HD MWF, HTN, T2DM, depression, anxiety, HLD, GERD, RLS, hypothyroidism who presented to the ED on the advice of neurology on 1/31 due to 2-3 weeks of right hand weakness. She noticed right (dominant) hand weakness/loss of dexterity several weeks ago which was debilitating enough to have a right subclavian HD catheter placed to transition from PD to HD. She was evaluated by neurology who referred her to [facility] for stroke evaluation. Brain MRI showed scattered infarcts of varying chronicity within the bilateral MCA distributions. There was microhemorrhages concerning for cerebral amyloid angiopathy. She was admitted for full stroke work up which included implantable loop recorder.  She required routine hemodialysis which was delayed. She remained in stable condition throughout hospitalization and is eager to discharge home the following morning after HD.  Assessment and Plan: * Acute CVA- (present on admission) Concern for embolic etiology with bilateral cortical distribution. Note symptoms preceded, and were cause for, HD catheter placement and no RUE edema to suggest DVT at  this time. - Aspirin 81mg  daily currently recommended by neurology, d/w Dr. Leonie Man. Caution with microhemorrhages, avoiding DAPT. - ILR placement (01/25/2022) pursued per neurology recommendations. Rationale is that a Watchman palcement would mitigate her risk for stroke were atrial fibrillation to be detected (with signs suggestive of cerebral amyloid angiopathy/microhemorrhages, long term anticoagulation is presents prohibitive risk. Note recent Zio patch recorded 12 days and 16 hours of data with no atrial fibrillation detected.  End stage renal failure on dialysis [facility]) Routine HD (last was 1/30) is due today. Nephrology notified, though no urgent HD needs. Plan for HD today, though was bumped due to acute cases, planned for 2/2 at 6am.  Hyperthyroidism- (present on admission) Continue methimazole  Restless legs- (present on admission) Continue ropinirole  HLD (hyperlipidemia)- (present on admission) Continue statin. LDL at 53 is at goal.   Gastroesophageal reflux disease- (present on admission) Continue PPI  Anxiety- (present on admission) Continue home medications  Controlled type 2 diabetes mellitus with kidney complication, without long-term current use of insulin ([place])- (present on admission) HbA1c 4.9%. Continue SSI. Will consider holding [facility] at discharge if pt has had hypoglycemic events.    Consultants: Neurology Nephrology EP Procedures performed: Loop recorder insertion 2/1  Disposition: Home Diet recommendation:  Cardiac and Carb modified diet  DISCHARGE MEDICATION: Allergies as of 01/26/2022       Reactions   Bee Venom Anaphylaxis   Other Anaphylaxis   Wasp sting        Medication List     STOP taking these medications    clonazePAM 0.5 MG tablet Commonly known as: KLONOPIN       TAKE these  medications    Accu-Chek SmartView test strip Generic drug: glucose blood   acetaminophen 500 MG tablet Commonly known as: TYLENOL Take 500-1,000 mg by  mouth every 6 (six) hours as needed for mild pain.   aspirin EC 81 MG tablet Take 1 tablet (81 mg total) by mouth daily. Swallow whole.   atorvastatin 40 MG tablet Commonly known as: LIPITOR Take 40 mg by mouth daily.   calcium carbonate 1500 (600 Ca) MG Tabs tablet Commonly known as: OSCAL Take 600 mg of elemental calcium by mouth daily with breakfast.   EPINEPHrine 0.3 mg/0.3 mL Soaj injection Commonly known as: EPI-PEN Inject 0.3 mg into the muscle once.   furosemide 40 MG tablet Commonly known as: LASIX Take 40 mg by mouth as needed for fluid.   gentamicin cream 0.1 % Commonly known as: GARAMYCIN Apply 1 application topically daily as needed (skin irritation).   glipiZIDE 2.5 MG 24 hr tablet Commonly known as: GLUCOTROL XL Take 2.5 mg by mouth daily with breakfast.   methimazole 5 MG tablet Commonly known as: TAPAZOLE Take 5 mg by mouth daily.   midodrine 5 MG tablet Commonly known as: PROAMATINE Take 5 mg by mouth 3 (three) times daily as needed (if BP is low).   multivitamin with minerals Tabs tablet Take 1 tablet by mouth daily.   omeprazole 40 MG capsule Commonly known as: PRILOSEC Take 40 mg by mouth daily.   rOPINIRole 3 MG tablet Commonly known as: REQUIP Take 3 mg by mouth at bedtime. Take 1 tab 1 to 3 hours before bedtime   traZODone 50 MG tablet Commonly known as: DESYREL Take 100 mg by mouth at bedtime. Take 1-2 tabs daily at bedtime        Follow-up Information     [facility] Follow up.   Why: The outpatient therapy will contact you for the first appointment Contact information: [place], [place] [address] [phone]        Lavone Orn, MD. Schedule an appointment as soon as possible for a visit in 1 week(s).   Specialty: Internal Medicine Contact information: [address] [place] [address] [phone]         Garvin Fila, MD Follow up in 2 month(s).   Specialties:  Neurology, Radiology Contact information: [address] [place] [address] [phone]         Baldwin Jamaica, PA-C Follow up.   Specialty: Cardiology Contact information: [place] [address] [phone]                 Discharge Exam: Danley Danker Weights   01/26/22 0528 01/26/22 0735 01/26/22 1044  Weight: 57.5 kg 57.3 kg 57.2 kg  Seen at HD in no distress, right hand apraxia improved but not resolved.  Condition at discharge: improving  The results of significant diagnostics from this hospitalization (including imaging, microbiology, ancillary and laboratory) are listed below for reference.   Imaging Studies: MR ANGIO HEAD WO CONTRAST  Result Date: 01/25/2022 CLINICAL DATA:  81 year old female with several scattered acute infarcts on brain MRI yesterday, evidence of amyloid angiopathy. EXAM: MRA HEAD WITHOUT CONTRAST TECHNIQUE: Angiographic images of the Circle of Willis were acquired using MRA technique without intravenous contrast. COMPARISON:  Brain MRI 01/24/2022. FINDINGS: Anterior circulation: Antegrade flow in both ICA siphons with mild ICA siphon irregularity and dolichoectasia. Mild stenosis at the right anterior genu. Normal ophthalmic artery origins. Patent carotid termini.  Patent MCA and ACA origins. There is a mild irregularity and stenosis in the left ACA distal A2 segment. Other visible ACA branches are within normal limits. MCA M1 segments and bifurcations are patent without stenosis. No MCA branch occlusion identified. No significant MCA branch stenosis. Posterior circulation: Antegrade flow in the posterior circulation with mildly dominant distal left vertebral artery. No distal vertebral stenosis. Both PICA origins are patent. Patent vertebrobasilar junction and basilar artery without stenosis. Patent basilar tip, SCA and PCA origins. Posterior communicating arteries are diminutive or absent. No PCA branch occlusion  is identified, but there is moderate irregularity of the bilateral P3 branches, with associated stenosis greater on the left side. Anatomic variants: Mildly dominant left vertebral artery. Other: Widespread susceptibility artifact related to amyloid angiopathy. IMPRESSION: 1. No circle-of-Willis branch occlusion identified. 2. Intracranial atherosclerosis with moderate irregularity and stenosis of the PCA P3 segments, greater on the Left. Mild stenosis in the anterior circulation at the right ICA anterior genu and distal left ACA A2. 3. Susceptibility artifact related to Amyloid Angiopathy. Electronically Signed   By: Genevie Ann M.D.   On: 01/25/2022 04:51   MR BRAIN WO CONTRAST  Result Date: 01/24/2022 CLINICAL DATA:  Loss of dexterity in right hand EXAM: MRI HEAD WITHOUT CONTRAST TECHNIQUE: Multiplanar, multiecho pulse sequences of the brain and surrounding structures were obtained without intravenous contrast. COMPARISON:  None. FINDINGS: Brain: There are scattered infarcts in both cerebral and cerebellar hemispheres, some of which appear acute while others appear more subacute. The largest infarct is in the left of the frontal lobe with involvement precentral gyrus and likely accounts for the patient's symptoms. Additional acute to subacute infarcts are seen in the right superior frontal gyrus, bilateral temporoparietal periventricular white matter and right occipital lobe. There is no significant hemorrhage associated with these infarcts. There is no acute extra-axial fluid collection. There is a background of mild global parenchymal volume loss with prominence of the ventricular system and extra-axial CSF spaces. There is extensive confluent FLAIR signal abnormality throughout the subcortical and periventricular white matter likely reflecting sequela of advanced chronic white matter microangiopathy. There is an additional remote infarct in the left cerebellar hemisphere and small remote infarct in the left  corona radiata. There are extensive punctate chronic microhemorrhages throughout the cerebral and cerebellar hemispheres which are predominantly peripheral in distribution. There is no mass lesion.  There is no midline shift. Vascular: Normal flow voids. Skull and upper cervical spine: Normal marrow signal. Sinuses/Orbits: The paranasal sinuses are clear. Bilateral lens implants are in place. The globes and orbits are otherwise unremarkable. Other: There is a left mastoid effusion. IMPRESSION: 1. Scattered infarcts in the bilateral cerebral hemispheres as detailed above, some of which appear acute while others appear more subacute. The largest infarct is in the left frontal lobe with involvement of the precentral gyrus, likely accounting for the patient's reported symptoms. Consider embolic etiology given involvement of multiple vascular territories. 2. Background of mild global parenchymal volume loss and advanced chronic white matter microangiopathy. Small remote infarcts in the left corona radiata and left cerebellar hemisphere. 3. Extensive punctate chronic microhemorrhages throughout the cerebral and cerebellar hemispheres predominantly peripheral in distribution concerning for cerebral amyloid angiopathy. Electronically Signed   By: Valetta Mole M.D.   On: 01/24/2022 16:41   MR Cervical Spine Wo Contrast  Result Date: 01/24/2022 CLINICAL DATA:  Loss of dexterity in right hand starting 01/09/2022 EXAM: MRI CERVICAL SPINE WITHOUT CONTRAST TECHNIQUE: Multiplanar, multisequence MR imaging of the cervical spine  was performed. No intravenous contrast was administered. COMPARISON:  None. FINDINGS: There is intermittent motion degradation. The sagittal T1 images are mildly motion degraded, and the axial images are moderately motion degraded. Within this confine: Alignment: Normal. Vertebrae: Vertebral body heights are preserved. There is no suspicious marrow signal abnormality or marrow edema. Cord: Normal in  signal and morphology, within the confines of motion degraded images. Posterior Fossa, vertebral arteries, paraspinal tissues: The posterior fossa is assessed on the separately dictated brain MRI. The vertebral artery flow voids are present. The paraspinal soft tissues are unremarkable. Disc levels: The disc heights are overall preserved. C2-C3: No significant spinal canal or neural foraminal stenosis C3-C4: No significant spinal canal or neural foraminal stenosis C4-C5: There is left worse than right facet arthropathy with prominent left-sided osteophytosis/ligamentum flavum thickening (9-12). Findings result in mild spinal canal stenosis with slight rightward displacement of the cervical cord but no evidence of frank cord compression or signal abnormality. There is mild left and no significant right neural foraminal stenosis. C5-C6: Mild uncovertebral and facet arthropathy resulting in mild left and no significant right neural foraminal stenosis and no significant spinal canal stenosis. C6-C7: No significant spinal canal or neural foraminal stenosis. C7-T1: No significant spinal canal or neural foraminal stenosis. IMPRESSION: 1. Left worse than right facet arthropathy with prominent left osteophytosis/ligamentum flavum thickening resulting in mild spinal canal stenosis with slight rightward displacement of the cord but no frank cord compression or definite cord signal abnormality. 2. Otherwise, minimal for age degenerative changes in the cervical spine without other high-grade spinal canal or neural foraminal stenosis. Electronically Signed   By: Valetta Mole M.D.   On: 01/24/2022 16:18   EP PPM/ICD IMPLANT  Result Date: 01/25/2022 SURGEON:  Allegra Lai, MD   PREPROCEDURE DIAGNOSIS:  Cryptogenic Stroke   POSTPROCEDURE DIAGNOSIS:  Cryptogenic Stroke    PROCEDURES:  1. Implantable loop recorder implantation   INTRODUCTION:  TEARA DUERKSEN is a 81 y.o. female with a history of unexplained stroke who presents today  for implantable loop implantation.  The patient has had a cryptogenic stroke.  Despite an extensive workup by neurology, no reversible causes have been identified.  she has worn telemetry during which she did not have arrhythmias.  There is significant concern for possible atrial fibrillation as the cause for the patients stroke.  The patient therefore presents today for implantable loop implantation.   DESCRIPTION OF PROCEDURE:  Informed written consent was obtained, and the patient was brought to the electrophysiology lab in a fasting state.  The patient required no sedation for the procedure today.  Mapping over the patient's chest was performed by the EP lab staff to identify the area where electrograms were most prominent for ILR recording.  This area was found to be the left parasternal region over the 3rd-4th intercostal space. The patients left chest was therefore prepped and draped in the usual sterile fashion by the EP lab staff. The skin overlying the left parasternal region was infiltrated with lidocaine for local analgesia.  A 0.5-cm incision was made over the left parasternal region over the 3rd intercostal space.  A subcutaneous ILR pocket was fashioned using a combination of sharp and blunt dissection.  A Medtronic Reveal Linq model [place] [id number] implantable loop recorder was then placed into the pocket  R waves were very prominent and measured 0.50mV. EBL<1 ml.  Steri- Strips and a sterile dressing were then applied.  There were no early apparent complications.   CONCLUSIONS:  1.  Successful implantation of a Medtronic Reveal LINQ implantable loop recorder for cryptogenic stroke  2. No early apparent complications.   ECHOCARDIOGRAM COMPLETE  Result Date: 01/25/2022    ECHOCARDIOGRAM REPORT   Patient Name:   NILAH BELCOURT Date of Exam: 01/25/2022 Medical Rec #:  [id number]    Height:       62.0 in Accession #:    [id number]   Weight:       127.0 lb Date of Birth:  12-09-1941    BSA:          1.576  m Patient Age:    12 years     BP:           138/67 mmHg Patient Gender: F            HR:           67 bpm. Exam Location:  Inpatient Procedure: 2D Echo, Cardiac Doppler and Color Doppler Indications:    I63.9 STROKE  History:        Patient has prior history of Echocardiogram examinations, most                 recent 12/14/2021. Risk Factors:Diabetes and Hypertension.  Sonographer:    Beryle Beams Referring Phys: [id number] [place]  1. Left ventricular ejection fraction, by estimation, is 55 to 60%. The left ventricle has normal function. The left ventricle has no regional wall motion abnormalities. There is mild concentric left ventricular hypertrophy. Left ventricular diastolic parameters are consistent with Grade I diastolic dysfunction (impaired relaxation).  2. Right ventricular systolic function is normal. The right ventricular size is normal. There is normal pulmonary artery systolic pressure.  3. The mitral valve is normal in structure. Trivial mitral valve regurgitation. No evidence of mitral stenosis.  4. Tricuspid valve regurgitation is mild to moderate.  5. The aortic valve is normal in structure. Aortic valve regurgitation is not visualized. Aortic valve sclerosis/calcification is present, without any evidence of aortic stenosis.  6. The inferior vena cava is normal in size with greater than 50% respiratory variability, suggesting right atrial pressure of 3 mmHg. Comparison(s): No significant change from prior study. FINDINGS  Left Ventricle: Left ventricular ejection fraction, by estimation, is 55 to 60%. The left ventricle has normal function. The left ventricle has no regional wall motion abnormalities. The left ventricular internal cavity size was normal in size. There is  mild concentric left ventricular hypertrophy. Left ventricular diastolic parameters are consistent with Grade I diastolic dysfunction (impaired relaxation). Right Ventricle: The right ventricular size is  normal. No increase in right ventricular wall thickness. Right ventricular systolic function is normal. There is normal pulmonary artery systolic pressure. The tricuspid regurgitant velocity is 2.81 m/s, and  with an assumed right atrial pressure of 3 mmHg, the estimated right ventricular systolic pressure is 01.0 mmHg. Left Atrium: Left atrial size was normal in size. Right Atrium: Right atrial size was normal in size. Pericardium: There is no evidence of pericardial effusion. Presence of epicardial fat layer. Mitral Valve: The mitral valve is normal in structure. Trivial mitral valve regurgitation. No evidence of mitral valve stenosis. MV peak gradient, 118.8 mmHg. The mean mitral valve gradient is 87.0 mmHg. Tricuspid Valve: The tricuspid valve is normal in structure. Tricuspid valve regurgitation is mild to moderate. No evidence of tricuspid stenosis. Aortic Valve: The aortic valve is normal in structure. Aortic valve regurgitation is not visualized. Aortic valve sclerosis/calcification is present, without any evidence of aortic stenosis. Aortic  valve mean gradient measures 8.0 mmHg. Aortic valve peak  gradient measures 14.6 mmHg. Aortic valve area, by VTI measures 1.07 cm. Pulmonic Valve: The pulmonic valve was normal in structure. Pulmonic valve regurgitation is trivial. No evidence of pulmonic stenosis. Aorta: The aortic root is normal in size and structure. Venous: The inferior vena cava is normal in size with greater than 50% respiratory variability, suggesting right atrial pressure of 3 mmHg. IAS/Shunts: No atrial level shunt detected by color flow Doppler.  LEFT VENTRICLE PLAX 2D LVIDd:         4.10 cm     Diastology LVIDs:         2.40 cm     LV e' medial:    7.94 cm/s LV PW:         1.20 cm     LV E/e' medial:  14.4 LV IVS:        1.00 cm     LV e' lateral:   10.60 cm/s LVOT diam:     1.60 cm     LV E/e' lateral: 10.8 LV SV:         50 LV SV Index:   32 LVOT Area:     2.01 cm  LV Volumes (MOD) LV vol  d, MOD A2C: 44.6 ml LV vol d, MOD A4C: 59.7 ml LV vol s, MOD A2C: 13.0 ml LV vol s, MOD A4C: 22.9 ml LV SV MOD A2C:     31.6 ml LV SV MOD A4C:     59.7 ml LV SV MOD BP:      36.1 ml RIGHT VENTRICLE             IVC RV S prime:     10.10 cm/s  IVC diam: 1.40 cm TAPSE (M-mode): 1.8 cm LEFT ATRIUM             Index        RIGHT ATRIUM           Index LA diam:        3.30 cm 2.09 cm/m   RA Area:     12.20 cm LA Vol (A2C):   51.4 ml 32.61 ml/m  RA Volume:   31.40 ml  19.92 ml/m LA Vol (A4C):   32.6 ml 20.68 ml/m LA Biplane Vol: 41.0 ml 26.01 ml/m  AORTIC VALVE                     PULMONIC VALVE AV Area (Vmax):    1.20 cm      PV Vmax:       0.61 m/s AV Area (Vmean):   1.16 cm      PV Vmean:      40.300 cm/s AV Area (VTI):     1.07 cm      PV VTI:        0.130 m AV Vmax:           191.00 cm/s   PV Peak grad:  1.5 mmHg AV Vmean:          135.000 cm/s  PV Mean grad:  1.0 mmHg AV VTI:            0.467 m AV Peak Grad:      14.6 mmHg AV Mean Grad:      8.0 mmHg LVOT Vmax:         114.00 cm/s LVOT Vmean:        77.700 cm/s LVOT VTI:  0.248 m LVOT/AV VTI ratio: 0.53  AORTA Ao Root diam: 2.40 cm Ao Asc diam:  2.50 cm MITRAL VALVE                TRICUSPID VALVE MV Area (PHT): 3.72 cm     TV Peak grad:   26.4 mmHg MV Area VTI:   0.25 cm     TV Mean grad:   19.0 mmHg MV Peak grad:  118.8 mmHg   TV Vmax:        2.57 m/s MV Mean grad:  87.0 mmHg    TV Vmean:       210.0 cm/s MV Vmax:       5.45 m/s     TV VTI:         0.78 msec MV Vmean:      447.0 cm/s   TR Peak grad:   31.6 mmHg MV Decel Time: 204 msec     TR Vmax:        281.00 cm/s MV E velocity: 114.00 cm/s MV A velocity: 111.00 cm/s  SHUNTS MV E/A ratio:  1.03         Systemic VTI:  0.25 m                             Systemic Diam: 1.60 cm Kardie Tobb DO Electronically signed by Berniece Salines DO Signature Date/Time: 01/25/2022/10:23:17 AM    Final    LONG TERM MONITOR (3-14 DAYS)  Result Date: 01/06/2022  The dominant rhythm was normal sinus with normal  circadian variation.  There are no meaningful episodes of bradycardia or pauses.  There is no atrial fibrillation.  Multiple episodes of nonsustained as well as sustained but brief supraventricular tachycardia (probably ectopic atrial tachycardia) are seen. The longest episode lasted for 18 seconds. These events occur both during daytime hours and the expected sleep hours.  Also seen are episodes of brief wide-complex tachycardia. While many of these represent SVT with aberrant conduction, some are true episodes of nonsustained VT lasting for 6-7 beats.  Relation between the patient's symptoms and the episodes of supraventricular tachycardia.  Abnormal event monitor due to the occurrence of relatively frequent episodes of nonsustained and briefly sustained ectopic atrial tachycardia which seem to correlate with the patient's symptoms.  The longest episode duration was 18 seconds. Atrial fibrillation was not recorded. Also noted are 2 very brief episodes of nonsustained ventricular tachycardia, maximum 7 beats. Patch Wear Time:  12 days and 16 hours (2022-12-27T05:24:14-0500 to 2023-01-08T21:46:31-0500) Patient had a min HR of 48 bpm, max HR of 231 bpm, and avg HR of 93 bpm. Predominant underlying rhythm was Sinus Rhythm. 2 Ventricular Tachycardia runs occurred, the run with the fastest interval lasting 7 beats with a max rate of 231 bpm, the longest lasting 6 beats with an avg rate of 161 bpm. Some episodes of Ventricular Tachycardia conducted with possible aberrancy. 33 Supraventricular Tachycardia runs occurred, the run with the fastest interval lasting 13 beats with a max rate of 226 bpm, the longest lasting 18.2 secs with an avg rate of 134 bpm. Isolated SVEs were occasional (1.0%, 15902), SVE Couplets were rare (<1.0%, 587), and SVE Triplets were rare (<1.0%, 45). Isolated VEs were rare (<1.0%), and no VE Couplets or VE Triplets were present. Inverted QRS complexes possibly due to inverted placement of  device.   VAS US CAROTID  Result Date: 01/26/2022 Carotid Arterial Duplex Study Patient Name:  TOMMY  London Sheer  Date of Exam:   01/25/2022 Medical Rec #: [id number]     Accession #:    [id number] Date of Birth: July 05, 1941     Patient Gender: F Patient Age:   81 years Exam Location:  [facility] Procedure:      VAS US CAROTID Referring Phys: Sheppard Coil MELVIN --------------------------------------------------------------------------------  Indications:       CVA. Risk Factors:      Hypertension, Diabetes. Comparison Study:  no prior Performing Technologist: Archie Patten RVS  Examination Guidelines: A complete evaluation includes B-mode imaging, spectral Doppler, color Doppler, and power Doppler as needed of all accessible portions of each vessel. Bilateral testing is considered an integral part of a complete examination. Limited examinations for reoccurring indications may be performed as noted.  Right Carotid Findings: +----------+--------+--------+--------+------------------+--------+             PSV cm/s EDV cm/s Stenosis Plaque Description Comments  +----------+--------+--------+--------+------------------+--------+  CCA Prox   67       11                heterogenous                 +----------+--------+--------+--------+------------------+--------+  CCA Distal 63       14                heterogenous                 +----------+--------+--------+--------+------------------+--------+  ICA Prox   75       23       1-39%    heterogenous                 +----------+--------+--------+--------+------------------+--------+  ICA Distal 81       30                                             +----------+--------+--------+--------+------------------+--------+  ECA        132      16                                             +----------+--------+--------+--------+------------------+--------+ +----------+--------+-------+--------+-------------------+             PSV cm/s EDV cms Describe Arm Pressure (mmHG)   +----------+--------+-------+--------+-------------------+  Subclavian 69                                             +----------+--------+-------+--------+-------------------+ +---------+--------+--+--------+--+---------+  Vertebral PSV cm/s 45 EDV cm/s 11 Antegrade  +---------+--------+--+--------+--+---------+  Left Carotid Findings: +----------+--------+--------+--------+-------------------------+--------+             PSV cm/s EDV cm/s Stenosis Plaque Description        Comments  +----------+--------+--------+--------+-------------------------+--------+  CCA Prox   70       16                heterogenous                        +----------+--------+--------+--------+-------------------------+--------+  CCA Distal 62       13  heterogenous                        +----------+--------+--------+--------+-------------------------+--------+  ICA Prox   73       23       1-39%    heterogenous and calcific           +----------+--------+--------+--------+-------------------------+--------+  ICA Distal 150      38                                                    +----------+--------+--------+--------+-------------------------+--------+  ECA        155      19                                                    +----------+--------+--------+--------+-------------------------+--------+ +----------+--------+--------+--------+-------------------+             PSV cm/s EDV cm/s Describe Arm Pressure (mmHG)  +----------+--------+--------+--------+-------------------+  Subclavian 78                                              +----------+--------+--------+--------+-------------------+ +---------+--------+--+--------+--+---------+  Vertebral PSV cm/s 58 EDV cm/s 13 Antegrade  +---------+--------+--+--------+--+---------+   Summary: Right Carotid: Velocities in the right ICA are consistent with a 1-39% stenosis. Left Carotid: Velocities in the left ICA are consistent with a 1-39% stenosis. Vertebrals: Bilateral  vertebral arteries demonstrate antegrade flow. *See table(s) above for measurements and observations.  Electronically signed by Antony Contras MD on 01/26/2022 at 1:38:56 PM.    Final     Microbiology: Results for orders placed or performed during the hospital encounter of 01/24/22  Resp Panel by RT-PCR (Flu A&B, Covid) Nasopharyngeal Swab     Status: None   Collection Time: 01/24/22  5:16 PM   Specimen: Nasopharyngeal Swab; Nasopharyngeal(NP) swabs in vial transport medium  Result Value Ref Range Status   SARS Coronavirus 2 by RT PCR NEGATIVE NEGATIVE Final    Comment: (NOTE) SARS-CoV-2 target nucleic acids are NOT DETECTED.  The SARS-CoV-2 RNA is generally detectable in upper respiratory specimens during the acute phase of infection. The lowest concentration of SARS-CoV-2 viral copies this assay can detect is 138 copies/mL. A negative result does not preclude SARS-Cov-2 infection and should not be used as the sole basis for treatment or other patient management decisions. A negative result may occur with  improper specimen collection/handling, submission of specimen other than nasopharyngeal swab, presence of viral mutation(s) within the areas targeted by this assay, and inadequate number of viral copies(<138 copies/mL). A negative result must be combined with clinical observations, patient history, and epidemiological information. The expected result is Negative.  Fact Sheet for Patients:  EntrepreneurPulse.com.au  Fact Sheet for Healthcare Providers:  IncredibleEmployment.be  This test is no t yet approved or cleared by the [place] FDA and  has been authorized for detection and/or diagnosis of SARS-CoV-2 by FDA under an Emergency Use Authorization (EUA). This EUA will remain  in effect (meaning this test can be used) for the duration of the COVID-19 declaration under Section 564(b)(1) of the  Act, 21 U.S.C.section 360bbb-3(b)(1), unless the  authorization is terminated  or revoked sooner.       Influenza A by PCR NEGATIVE NEGATIVE Final   Influenza B by PCR NEGATIVE NEGATIVE Final    Comment: (NOTE) The Xpert Xpress SARS-CoV-2/FLU/RSV plus assay is intended as an aid in the diagnosis of influenza from Nasopharyngeal swab specimens and should not be used as a sole basis for treatment. Nasal washings and aspirates are unacceptable for Xpert Xpress SARS-CoV-2/FLU/RSV testing.  Fact Sheet for Patients: EntrepreneurPulse.com.au  Fact Sheet for Healthcare Providers: IncredibleEmployment.be  This test is not yet approved or cleared by the [place] FDA and has been authorized for detection and/or diagnosis of SARS-CoV-2 by FDA under an Emergency Use Authorization (EUA). This EUA will remain in effect (meaning this test can be used) for the duration of the COVID-19 declaration under Section 564(b)(1) of the Act, 21 U.S.C. section 360bbb-3(b)(1), unless the authorization is terminated or revoked.  Performed at [facility], [place] [address] [place], [place] [address]     Labs: CBC: Recent Labs  Lab 01/24/22 1405 01/24/22 1420 01/26/22 0817  WBC 5.3  --  5.9  NEUTROABS 3.4  --   --   HGB 8.8* 8.8* 7.9*  HCT 28.3* 26.0* 24.2*  MCV 100.7*  --  98.8  PLT 164  --  545   Basic Metabolic Panel: Recent Labs  Lab 01/24/22 1405 01/24/22 1420 01/26/22 0818  NA 140 139 140  K 4.5 4.5 5.7*  CL 103 104 103  CO2 27  --  26  GLUCOSE 107* 106* 87  BUN 16 19 42*  CREATININE 4.00* 4.50* 6.66*  CALCIUM 8.8*  --  8.3*  PHOS  --   --  1.7*   Liver Function Tests: Recent Labs  Lab 01/24/22 1405 01/26/22 0818  AST 24  --   ALT 24  --   ALKPHOS 106  --   BILITOT 0.5  --   PROT 6.2*  --   ALBUMIN 3.0* 2.7*   CBG: Recent Labs  Lab 01/25/22 1221 01/25/22 1708 01/25/22 2116 01/26/22 0613 01/26/22 1129  GLUCAP 107* 117* 131* 87 92    Discharge time spent: greater  than 30 minutes.  Signed: [name], MD [facility] 01/27/2022

## 2022-01-26 NOTE — Plan of Care (Signed)
°  Problem: Education: Goal: Knowledge of General Education information will improve Description: Including pain rating scale, medication(s)/side effects and non-pharmacologic comfort measures Outcome: Progressing   Problem: Health Behavior/Discharge Planning: Goal: Ability to manage health-related needs will improve Outcome: Progressing   Problem: Clinical Measurements: Goal: Ability to maintain clinical measurements within normal limits will improve Outcome: Progressing Goal: Will remain free from infection Outcome: Progressing Goal: Diagnostic test results will improve Outcome: Progressing Goal: Respiratory complications will improve Outcome: Progressing Goal: Cardiovascular complication will be avoided Outcome: Progressing   Problem: Activity: Goal: Risk for activity intolerance will decrease Outcome: Progressing   Problem: Nutrition: Goal: Adequate nutrition will be maintained Outcome: Progressing   Problem: Coping: Goal: Level of anxiety will decrease Outcome: Progressing   Problem: Elimination: Goal: Will not experience complications related to bowel motility Outcome: Progressing Goal: Will not experience complications related to urinary retention Outcome: Progressing   Problem: Pain Managment: Goal: General experience of comfort will improve Outcome: Progressing   Problem: Safety: Goal: Ability to remain free from injury will improve Outcome: Progressing   Problem: Skin Integrity: Goal: Risk for impaired skin integrity will decrease Outcome: Progressing   Problem: Education: Goal: Knowledge of disease or condition will improve Outcome: Progressing Goal: Knowledge of secondary prevention will improve (SELECT ALL) Outcome: Progressing Goal: Knowledge of patient specific risk factors will improve (INDIVIDUALIZE FOR PATIENT) Outcome: Progressing   Problem: Coping: Goal: Will verbalize positive feelings about self Outcome: Progressing Goal: Will  identify appropriate support needs Outcome: Progressing   Problem: Health Behavior/Discharge Planning: Goal: Ability to manage health-related needs will improve Outcome: Progressing   Problem: Self-Care: Goal: Ability to participate in self-care as condition permits will improve Outcome: Progressing Goal: Verbalization of feelings and concerns over difficulty with self-care will improve Outcome: Progressing

## 2022-01-26 NOTE — Progress Notes (Signed)
Contacted [facility] and spoke to [place]. Clinic advised pt for d/c today and will resume care tomorrow.   Melven Sartorius Renal Navigator [phone]

## 2022-01-26 NOTE — Plan of Care (Signed)
Patient was discharged after HD, prior to assessment today. Patient only with RUE neurological deficits on yesterday's exam, which plan is to follow-up with neurology and OT outpatient for management. Patient largely independent at home and cognitively intact on our assessment. Cardiology in agreement that patient not LTA candidate but could be short-term candidate if Afib found in order to potentially have watchman procedure. Recommend outpatient cardiology follow-up as well, as patient had ILR placed yesterday.    Rosezetta Schlatter, MD PGY-1 Neuro Psych Resident 01/26/2022  12:12 PM

## 2022-01-26 NOTE — Progress Notes (Signed)
Patient ID: Monica Hunt, female   DOB: Jan 08, 1941, 81 y.o.   MRN: [id number]  [facility] Progress Note   Assessment/ Plan:    Acute CVA: found to have multiple embolic infarcts on MRI raising concern for cardio-embolic source along with cerebral amyloid angiopathy. Underwent LINQ monitor placement by cardiology per neurology recommendations after previous Zio patch did not show any evidence of atrial fibrillation.   ESRD:  Recent transition from PD to HD due to radial nerve palsy. Could not get HD yesterday due to prohibitive staff/patient ratio and instead getting it today. Plans noted for possible DC home later today to resume OP HD on MWF schedule tomorrow.  Hypertension/volume: No volume overload on exam. No UF with HD today to allow permissive hypertension per neurology recommendation.  Anemia: Hgb 8.8. - Continue ESA at OP HD unit. .  Metabolic bone disease: Calcium controlled. Not on VDRA or binders, follow phos level.   Nutrition:  Hgb 3.0. Start  protein supplements once diet is advanced.  Subjective:   Reports to be feeling well and informs me that she is going home today   Objective:   BP 127/63 (BP Location: Left Arm)    Pulse 76    Temp 97.9 F (36.6 C) (Oral)    Resp 16    Ht 5\' 2"  (1.575 m)    Wt 57.5 kg    SpO2 99%    BMI 23.19 kg/m   Physical Exam: ESP:[id number] resting on hemodialysis FHL:KTGYB regular rhythm and normal rate, normal s1 and s2 Resp:clear to auscultation bilaterally, no rales/rhonchi, RIJ TDC hooked to dialysis WLS:LHTD, flat, non-tender Ext:No lower extremity edema  Labs: BMET Recent Labs  Lab 01/24/22 1405 01/24/22 1420  NA 140 139  K 4.5 4.5  CL 103 104  CO2 27  --   GLUCOSE 107* 106*  BUN 16 19  CREATININE 4.00* 4.50*  CALCIUM 8.8*  --    CBC Recent Labs  Lab 01/24/22 1405 01/24/22 1420  WBC 5.3  --   NEUTROABS 3.4  --   HGB 8.8* 8.8*  HCT 28.3* 26.0*  MCV 100.7*  --   PLT 164  --       Medications:      aspirin  300 mg Rectal Daily   Or   aspirin  325 mg Oral Daily   atorvastatin  40 mg Oral Daily   Chlorhexidine Gluconate Cloth  6 each Topical Q0600   heparin injection (subcutaneous)  5,000 Units Subcutaneous Q8H   insulin aspart  0-6 Units Subcutaneous TID WC   LORazepam  0.5 mg Oral Once   methimazole  7.5 mg Oral Daily   pantoprazole  40 mg Oral Daily   rOPINIRole  3 mg Oral QHS   sodium chloride flush  3 mL Intravenous Once     Elmarie Shiley, MD 01/26/2022, 7:35 AM

## 2022-01-27 ENCOUNTER — Telehealth (HOSPITAL_COMMUNITY): Payer: Self-pay | Admitting: Nephrology

## 2022-01-27 DIAGNOSIS — D631 Anemia in chronic kidney disease: Secondary | ICD-10-CM | POA: Diagnosis not present

## 2022-01-27 DIAGNOSIS — N2581 Secondary hyperparathyroidism of renal origin: Secondary | ICD-10-CM | POA: Diagnosis not present

## 2022-01-27 DIAGNOSIS — Z992 Dependence on renal dialysis: Secondary | ICD-10-CM | POA: Diagnosis not present

## 2022-01-27 DIAGNOSIS — Z23 Encounter for immunization: Secondary | ICD-10-CM | POA: Diagnosis not present

## 2022-01-27 DIAGNOSIS — N186 End stage renal disease: Secondary | ICD-10-CM | POA: Diagnosis not present

## 2022-01-27 DIAGNOSIS — E876 Hypokalemia: Secondary | ICD-10-CM | POA: Diagnosis not present

## 2022-01-27 NOTE — Telephone Encounter (Signed)
Transition of care contact from inpatient facility  Date of Discharge: 01/26/22 Date of Contact: 01/27/22 - attempted Method of contact: Phone  Attempted to contact patient to discuss transition of care from inpatient admission. Patient did not answer the phone. There was no availability to leave a message due to voicemail being full.  Veneta Penton, PA-C [employer] Pager [phone]

## 2022-01-28 ENCOUNTER — Telehealth (HOSPITAL_COMMUNITY): Payer: Self-pay | Admitting: Nephrology

## 2022-01-28 NOTE — Telephone Encounter (Signed)
Transition of care contact from inpatient facility  Date of Discharge: 01/26/22 Date of Contact: 01/28/22 Method of contact: Phone  Attempted to contact patient to discuss transition of care from inpatient admission with alternative phone number. Patient did not answer the phone. Rang [employer] Rang, no ability to leave a message.  Veneta Penton, PA-C [employer] Pager [phone]

## 2022-01-30 DIAGNOSIS — N186 End stage renal disease: Secondary | ICD-10-CM | POA: Diagnosis not present

## 2022-01-30 DIAGNOSIS — N2581 Secondary hyperparathyroidism of renal origin: Secondary | ICD-10-CM | POA: Diagnosis not present

## 2022-01-30 DIAGNOSIS — Z992 Dependence on renal dialysis: Secondary | ICD-10-CM | POA: Diagnosis not present

## 2022-01-30 DIAGNOSIS — E876 Hypokalemia: Secondary | ICD-10-CM | POA: Diagnosis not present

## 2022-01-30 DIAGNOSIS — D631 Anemia in chronic kidney disease: Secondary | ICD-10-CM | POA: Diagnosis not present

## 2022-01-30 DIAGNOSIS — Z23 Encounter for immunization: Secondary | ICD-10-CM | POA: Diagnosis not present

## 2022-02-01 ENCOUNTER — Other Ambulatory Visit: Payer: Self-pay

## 2022-02-01 ENCOUNTER — Telehealth: Payer: Self-pay | Admitting: Cardiovascular Disease

## 2022-02-01 ENCOUNTER — Encounter: Payer: Self-pay | Admitting: Vascular Surgery

## 2022-02-01 ENCOUNTER — Ambulatory Visit: Payer: Medicare Other | Admitting: Vascular Surgery

## 2022-02-01 VITALS — BP 133/75 | HR 77 | Temp 97.8°F | Resp 20 | Ht 62.0 in | Wt 126.0 lb

## 2022-02-01 DIAGNOSIS — D631 Anemia in chronic kidney disease: Secondary | ICD-10-CM | POA: Diagnosis not present

## 2022-02-01 DIAGNOSIS — N186 End stage renal disease: Secondary | ICD-10-CM | POA: Diagnosis not present

## 2022-02-01 DIAGNOSIS — E876 Hypokalemia: Secondary | ICD-10-CM | POA: Diagnosis not present

## 2022-02-01 DIAGNOSIS — Z992 Dependence on renal dialysis: Secondary | ICD-10-CM

## 2022-02-01 DIAGNOSIS — Z23 Encounter for immunization: Secondary | ICD-10-CM | POA: Diagnosis not present

## 2022-02-01 DIAGNOSIS — N2581 Secondary hyperparathyroidism of renal origin: Secondary | ICD-10-CM | POA: Diagnosis not present

## 2022-02-01 NOTE — Telephone Encounter (Signed)
° °  Name: Monica Hunt  DOB: 03-Jun-1941  MRN: [id number]   Primary Cardiologist: Monica Klein, MD  Chart reviewed as part of pre-operative protocol coverage. Patient was recently seen by EP team 01/25/22 for consultation for stroke and loop recorder was implanted. Per chart, hx includes HTN, DM, ESRF previously on PD > HD recently, HLD, syncope, polyneuropathy, hyperthyroidism, GERD, bradycardia, and stroke.   Per notes, she has prior hx of syncope in 2018, found to have persistent bradycardia on monitor so BB was discontinued. Prior ETT 2020 was normal. She was seen in the office 11/2021 by Monica Hunt with another episode of syncope. Echo showed EF 55-60%. Monitor showed: Abnormal event monitor due to the occurrence of relatively frequent episodes of nonsustained and briefly sustained ectopic atrial tachycardia which seem to correlate with the patient's symptoms.  The longest episode duration was 18 seconds. Atrial fibrillation was not recorded. Also noted are 2 very brief episodes of nonsustained ventricular tachycardia, maximum 7 beats. It was recommended to consider ILR in the future.  She was subsequently admitted recently 1/31-01/26/22 with strokes, imaging showing multiple infarcts of different times as well as multiple microhemorrhages. Repeat echo 01/2022 EF 55-60%, grade 1 DD, mild-moderate TR. TEE not performed. She was seen by EP and underwent loop recorder implantation. RCRI is calculated at 6.6% indicating moderate risk.   In light of recent events, vascular surgery is recommending cardiac clearance prior to general anesthesia for PD access. Given recent issues with unexplained syncope last OV as well as recent strokes and significant comorbidities, will route to Dr. Sallyanne Hunt as to whether he feels any additional cardiac testing is needed prior to surgery with general anesthesia. Dr. Sallyanne Hunt  - Please route response to P [facility] PREOP (the pre-op pool). Thank you.    Monica Pitter,  PA-C 02/01/2022, 2:28 PM

## 2022-02-01 NOTE — Telephone Encounter (Signed)
° °  Pre-operative Risk Assessment    Patient Name: Monica Hunt  DOB: Mar 06, 1941 MRN: [id number]      Request for Surgical Clearance    Procedure:  Peritoneal Dialysis Catheter removal R arm AVF  Date of Surgery:  Clearance 02/28/22                                 Surgeon:  Dr. Doristine Locks Group or Practice Name:  [facility] Phone number:  [phone] Fax number:  [phone]   Type of Clearance Requested:   - Medical    Type of Anesthesia:  General    Additional requests/questions:    SignedJohnna Acosta   02/01/2022, 11:18 AM

## 2022-02-01 NOTE — Progress Notes (Signed)
Patient ID: Monica Hunt, female   DOB: May 12, 1941, 81 y.o.   MRN: [id number]  Reason for Consult: Follow-up   Referred by Lavone Orn, MD  Subjective:     HPI:  Monica Hunt is a 81 y.o. female with end-stage renal disease previously had a PD catheter placed.  I evaluated to possibly revise this versus remove this in the past.  She is now on hemodialysis via tunneled dialysis catheter and needs a PD catheter removed.  She has never had any permanent dialysis access in her upper extremities.  She is right-hand dominant but recently had a stroke which is created significant weakness and dysfunction in the right upper extremity.  She is currently wearing a event monitor followed by cardiology.  She is not taking any blood thinners.  Past Medical History:  Diagnosis Date   Cataracts, bilateral    Chronic kidney disease    Depression    Diabetes mellitus without complication ([place])    Hypertension    Family History  Problem Relation Age of Onset   Kidney cancer Mother    Lung cancer Father    Past Surgical History:  Procedure Laterality Date   ABDOMINAL HYSTERECTOMY     vaginal   CARPAL TUNNEL RELEASE Right    COLONOSCOPY WITH PROPOFOL N/A 11/17/2014   Procedure: COLONOSCOPY WITH PROPOFOL;  Surgeon: Garlan Fair, MD;  Location: [facility];  Service: Endoscopy;  Laterality: N/A;   LOOP RECORDER INSERTION N/A 01/25/2022   Procedure: LOOP RECORDER INSERTION;  Surgeon: Constance Haw, MD;  Location: [facility];  Service: Cardiovascular;  Laterality: N/A;    Short Social History:  Social History   Tobacco Use   Smoking status: Every Day    Packs/day: 0.25    Years: 45.00    Pack years: 11.25    Types: Cigarettes   Smokeless tobacco: Never  Substance Use Topics   Alcohol use: No    Allergies  Allergen Reactions   Bee Venom Anaphylaxis   Other Anaphylaxis    Wasp sting    Current Outpatient Medications  Medication Sig Dispense Refill   ACCU-CHEK  SMARTVIEW test strip      acetaminophen (TYLENOL) 500 MG tablet Take 500-1,000 mg by mouth every 6 (six) hours as needed for mild pain.     aspirin EC 81 MG tablet Take 1 tablet (81 mg total) by mouth daily. Swallow whole. 30 tablet 11   atorvastatin (LIPITOR) 40 MG tablet Take 40 mg by mouth daily.     calcium carbonate (OSCAL) 1500 (600 Ca) MG TABS tablet Take 600 mg of elemental calcium by mouth daily with breakfast.     EPINEPHrine 0.3 mg/0.3 mL IJ SOAJ injection Inject 0.3 mg into the muscle once.     furosemide (LASIX) 40 MG tablet Take 40 mg by mouth as needed for fluid.     gentamicin cream (GARAMYCIN) 0.1 % Apply 1 application topically daily as needed (skin irritation).     glipiZIDE (GLUCOTROL XL) 2.5 MG 24 hr tablet Take 2.5 mg by mouth daily with breakfast.     methimazole (TAPAZOLE) 5 MG tablet Take 5 mg by mouth daily.     midodrine (PROAMATINE) 5 MG tablet Take 5 mg by mouth 3 (three) times daily as needed (if BP is low).     Multiple Vitamin (MULTIVITAMIN WITH MINERALS) TABS tablet Take 1 tablet by mouth daily.     omeprazole (PRILOSEC) 40 MG capsule Take 40 mg by mouth  daily.     rOPINIRole (REQUIP) 3 MG tablet Take 3 mg by mouth at bedtime. Take 1 tab 1 to 3 hours before bedtime     traZODone (DESYREL) 50 MG tablet Take 100 mg by mouth at bedtime. Take 1-2 tabs daily at bedtime     No current facility-administered medications for this visit.    Review of Systems  Constitutional:  Constitutional negative. HENT: HENT negative.  Eyes: Eyes negative.  Respiratory: Respiratory negative.  Cardiovascular: Cardiovascular negative.  GI: Gastrointestinal negative.  Musculoskeletal: Musculoskeletal negative.  Skin: Skin negative.  Neurological: Positive for focal weakness.  Hematologic: Hematologic/lymphatic negative.  Psychiatric: Psychiatric negative.       Objective:  Objective   Vitals:   02/01/22 0904  BP: 133/75  Pulse: 77  Resp: 20  Temp: 97.8 F (36.6 C)   SpO2: 97%  Weight: 126 lb (57.2 kg)  Height: 5\' 2"  (1.575 m)   Body mass index is 23.05 kg/m.  Physical Exam HENT:     Head: Normocephalic.     Nose:     Comments: Wearing a mask Eyes:     Pupils: Pupils are equal, round, and reactive to light.  Neck:     Comments: Tunneled dialysis catheter on the right Cardiovascular:     Pulses: Normal pulses.  Pulmonary:     Effort: Pulmonary effort is normal.  Abdominal:     General: Abdomen is flat.     Palpations: Abdomen is soft.  Musculoskeletal:        General: Normal range of motion.     Cervical back: Normal range of motion and neck supple.  Skin:    General: Skin is warm.     Capillary Refill: Capillary refill takes less than 2 seconds.  Neurological:     Mental Status: She is alert.     Comments: Right upper extremity weakness  Psychiatric:        Mood and Affect: Mood normal.    Data: No studies today     Assessment/Plan:    81 year old female with previous PD catheter placement now on dialysis via tunneled dialysis catheter.  We will plan to remove the PD catheter in place right upper extremity dialysis access fistula versus graft.  She does not have any vein mapping but on exam today appears she would likely need a graft.  She does have very strong brachial and radial pulses on the right.  She currently has an event monitor followed by cardiology we will need get clearance from them first.  After this we will get her scheduled for PD catheter removal and right upper extremity permanent access placement on a nondialysis day in the near future.     Waynetta Sandy MD [facility]

## 2022-02-02 NOTE — Telephone Encounter (Signed)
Thanks, Dayna. The concern remains so-far undiagnosed AFib. But I do not think additional workup or change in treatment will change her perioperative risk, so would go ahead with surgery without changes

## 2022-02-02 NOTE — Telephone Encounter (Signed)
° ° °  Patient Name: Monica Hunt  DOB: Aug 14, 1941 MRN: [id number]  Primary Cardiologist: Sanda Klein, MD  Chart reviewed as part of pre-operative protocol coverage. Given past medical history and time since last visit, based on ACC/AHA guidelines, Monica Hunt would be at acceptable risk for the planned procedure without further cardiovascular testing.   The patient was advised that if she develops new symptoms prior to surgery to contact our office to arrange for a follow-up visit, and she verbalized understanding.  I will route this recommendation to the requesting party via Epic fax function and remove from pre-op pool.  Please call with questions.  Mable Fill, Marissa Nestle, NP 02/02/2022, 11:39 AM

## 2022-02-03 DIAGNOSIS — N2581 Secondary hyperparathyroidism of renal origin: Secondary | ICD-10-CM | POA: Diagnosis not present

## 2022-02-03 DIAGNOSIS — E876 Hypokalemia: Secondary | ICD-10-CM | POA: Diagnosis not present

## 2022-02-03 DIAGNOSIS — N186 End stage renal disease: Secondary | ICD-10-CM | POA: Diagnosis not present

## 2022-02-03 DIAGNOSIS — D631 Anemia in chronic kidney disease: Secondary | ICD-10-CM | POA: Diagnosis not present

## 2022-02-03 DIAGNOSIS — Z23 Encounter for immunization: Secondary | ICD-10-CM | POA: Diagnosis not present

## 2022-02-03 DIAGNOSIS — Z992 Dependence on renal dialysis: Secondary | ICD-10-CM | POA: Diagnosis not present

## 2022-02-06 DIAGNOSIS — Z992 Dependence on renal dialysis: Secondary | ICD-10-CM | POA: Diagnosis not present

## 2022-02-06 DIAGNOSIS — E876 Hypokalemia: Secondary | ICD-10-CM | POA: Diagnosis not present

## 2022-02-06 DIAGNOSIS — Z23 Encounter for immunization: Secondary | ICD-10-CM | POA: Diagnosis not present

## 2022-02-06 DIAGNOSIS — N186 End stage renal disease: Secondary | ICD-10-CM | POA: Diagnosis not present

## 2022-02-06 DIAGNOSIS — D631 Anemia in chronic kidney disease: Secondary | ICD-10-CM | POA: Diagnosis not present

## 2022-02-06 DIAGNOSIS — N2581 Secondary hyperparathyroidism of renal origin: Secondary | ICD-10-CM | POA: Diagnosis not present

## 2022-02-07 DIAGNOSIS — I69990 Apraxia following unspecified cerebrovascular disease: Secondary | ICD-10-CM | POA: Diagnosis not present

## 2022-02-07 DIAGNOSIS — R278 Other lack of coordination: Secondary | ICD-10-CM | POA: Diagnosis not present

## 2022-02-08 DIAGNOSIS — D631 Anemia in chronic kidney disease: Secondary | ICD-10-CM | POA: Diagnosis not present

## 2022-02-08 DIAGNOSIS — N2581 Secondary hyperparathyroidism of renal origin: Secondary | ICD-10-CM | POA: Diagnosis not present

## 2022-02-08 DIAGNOSIS — E876 Hypokalemia: Secondary | ICD-10-CM | POA: Diagnosis not present

## 2022-02-08 DIAGNOSIS — Z992 Dependence on renal dialysis: Secondary | ICD-10-CM | POA: Diagnosis not present

## 2022-02-08 DIAGNOSIS — N186 End stage renal disease: Secondary | ICD-10-CM | POA: Diagnosis not present

## 2022-02-08 DIAGNOSIS — Z23 Encounter for immunization: Secondary | ICD-10-CM | POA: Diagnosis not present

## 2022-02-09 ENCOUNTER — Other Ambulatory Visit: Payer: Self-pay

## 2022-02-09 ENCOUNTER — Ambulatory Visit (INDEPENDENT_AMBULATORY_CARE_PROVIDER_SITE_OTHER): Payer: Medicare Other

## 2022-02-09 DIAGNOSIS — N185 Chronic kidney disease, stage 5: Secondary | ICD-10-CM | POA: Diagnosis not present

## 2022-02-09 DIAGNOSIS — I639 Cerebral infarction, unspecified: Secondary | ICD-10-CM

## 2022-02-09 LAB — CUP PACEART INCLINIC DEVICE CHECK
Date Time Interrogation Session: 20230216105911
Implantable Pulse Generator Implant Date: 20230201

## 2022-02-09 NOTE — Patient Instructions (Signed)
° °  After Your Loop Recorder (ILR)   Monitor implant site for redness, swelling, and drainage. Call the device clinic at [phone] if you experience these symptoms or fever/chills.  You may shower 3 days after your loop recorder implant and wash your incision with soap and water. Avoid lotions, ointments, or perfumes over your incision until it is well-healed.  You may use a hot tub or a pool after your wound check appointment if the incision is completely closed.   Your ILR is MRI compatible.  Remote monitoring is used to monitor your pacemaker from home. This monitoring is scheduled every 31 days by our office.

## 2022-02-09 NOTE — Progress Notes (Signed)
ILR wound check in clinic. Steri strips removed. Wound well healed. Home monitor transmitting nightly, next scheduled summary report on 03/01/22. No episodes. Questions answered.

## 2022-02-10 DIAGNOSIS — Z23 Encounter for immunization: Secondary | ICD-10-CM | POA: Diagnosis not present

## 2022-02-10 DIAGNOSIS — N2581 Secondary hyperparathyroidism of renal origin: Secondary | ICD-10-CM | POA: Diagnosis not present

## 2022-02-10 DIAGNOSIS — E876 Hypokalemia: Secondary | ICD-10-CM | POA: Diagnosis not present

## 2022-02-10 DIAGNOSIS — Z992 Dependence on renal dialysis: Secondary | ICD-10-CM | POA: Diagnosis not present

## 2022-02-10 DIAGNOSIS — N186 End stage renal disease: Secondary | ICD-10-CM | POA: Diagnosis not present

## 2022-02-10 DIAGNOSIS — D631 Anemia in chronic kidney disease: Secondary | ICD-10-CM | POA: Diagnosis not present

## 2022-02-13 DIAGNOSIS — D631 Anemia in chronic kidney disease: Secondary | ICD-10-CM | POA: Diagnosis not present

## 2022-02-13 DIAGNOSIS — N186 End stage renal disease: Secondary | ICD-10-CM | POA: Diagnosis not present

## 2022-02-13 DIAGNOSIS — E876 Hypokalemia: Secondary | ICD-10-CM | POA: Diagnosis not present

## 2022-02-13 DIAGNOSIS — N2581 Secondary hyperparathyroidism of renal origin: Secondary | ICD-10-CM | POA: Diagnosis not present

## 2022-02-13 DIAGNOSIS — Z23 Encounter for immunization: Secondary | ICD-10-CM | POA: Diagnosis not present

## 2022-02-13 DIAGNOSIS — I69990 Apraxia following unspecified cerebrovascular disease: Secondary | ICD-10-CM | POA: Diagnosis not present

## 2022-02-13 DIAGNOSIS — Z992 Dependence on renal dialysis: Secondary | ICD-10-CM | POA: Diagnosis not present

## 2022-02-13 DIAGNOSIS — R278 Other lack of coordination: Secondary | ICD-10-CM | POA: Diagnosis not present

## 2022-02-15 DIAGNOSIS — N2581 Secondary hyperparathyroidism of renal origin: Secondary | ICD-10-CM | POA: Diagnosis not present

## 2022-02-15 DIAGNOSIS — E876 Hypokalemia: Secondary | ICD-10-CM | POA: Diagnosis not present

## 2022-02-15 DIAGNOSIS — Z23 Encounter for immunization: Secondary | ICD-10-CM | POA: Diagnosis not present

## 2022-02-15 DIAGNOSIS — Z992 Dependence on renal dialysis: Secondary | ICD-10-CM | POA: Diagnosis not present

## 2022-02-15 DIAGNOSIS — D631 Anemia in chronic kidney disease: Secondary | ICD-10-CM | POA: Diagnosis not present

## 2022-02-15 DIAGNOSIS — N186 End stage renal disease: Secondary | ICD-10-CM | POA: Diagnosis not present

## 2022-02-17 DIAGNOSIS — D631 Anemia in chronic kidney disease: Secondary | ICD-10-CM | POA: Diagnosis not present

## 2022-02-17 DIAGNOSIS — Z992 Dependence on renal dialysis: Secondary | ICD-10-CM | POA: Diagnosis not present

## 2022-02-17 DIAGNOSIS — N186 End stage renal disease: Secondary | ICD-10-CM | POA: Diagnosis not present

## 2022-02-17 DIAGNOSIS — E876 Hypokalemia: Secondary | ICD-10-CM | POA: Diagnosis not present

## 2022-02-17 DIAGNOSIS — N2581 Secondary hyperparathyroidism of renal origin: Secondary | ICD-10-CM | POA: Diagnosis not present

## 2022-02-17 DIAGNOSIS — Z23 Encounter for immunization: Secondary | ICD-10-CM | POA: Diagnosis not present

## 2022-02-20 DIAGNOSIS — N2581 Secondary hyperparathyroidism of renal origin: Secondary | ICD-10-CM | POA: Diagnosis not present

## 2022-02-20 DIAGNOSIS — D631 Anemia in chronic kidney disease: Secondary | ICD-10-CM | POA: Diagnosis not present

## 2022-02-20 DIAGNOSIS — Z992 Dependence on renal dialysis: Secondary | ICD-10-CM | POA: Diagnosis not present

## 2022-02-20 DIAGNOSIS — R278 Other lack of coordination: Secondary | ICD-10-CM | POA: Diagnosis not present

## 2022-02-20 DIAGNOSIS — I69990 Apraxia following unspecified cerebrovascular disease: Secondary | ICD-10-CM | POA: Diagnosis not present

## 2022-02-20 DIAGNOSIS — Z23 Encounter for immunization: Secondary | ICD-10-CM | POA: Diagnosis not present

## 2022-02-20 DIAGNOSIS — N186 End stage renal disease: Secondary | ICD-10-CM | POA: Diagnosis not present

## 2022-02-20 DIAGNOSIS — E876 Hypokalemia: Secondary | ICD-10-CM | POA: Diagnosis not present

## 2022-02-21 DIAGNOSIS — Z992 Dependence on renal dialysis: Secondary | ICD-10-CM | POA: Diagnosis not present

## 2022-02-21 DIAGNOSIS — I129 Hypertensive chronic kidney disease with stage 1 through stage 4 chronic kidney disease, or unspecified chronic kidney disease: Secondary | ICD-10-CM | POA: Diagnosis not present

## 2022-02-21 DIAGNOSIS — N186 End stage renal disease: Secondary | ICD-10-CM | POA: Diagnosis not present

## 2022-02-22 DIAGNOSIS — Z992 Dependence on renal dialysis: Secondary | ICD-10-CM | POA: Diagnosis not present

## 2022-02-22 DIAGNOSIS — N186 End stage renal disease: Secondary | ICD-10-CM | POA: Diagnosis not present

## 2022-02-22 DIAGNOSIS — D631 Anemia in chronic kidney disease: Secondary | ICD-10-CM | POA: Diagnosis not present

## 2022-02-22 DIAGNOSIS — E876 Hypokalemia: Secondary | ICD-10-CM | POA: Diagnosis not present

## 2022-02-22 DIAGNOSIS — N2581 Secondary hyperparathyroidism of renal origin: Secondary | ICD-10-CM | POA: Diagnosis not present

## 2022-02-24 DIAGNOSIS — N2581 Secondary hyperparathyroidism of renal origin: Secondary | ICD-10-CM | POA: Diagnosis not present

## 2022-02-24 DIAGNOSIS — N186 End stage renal disease: Secondary | ICD-10-CM | POA: Diagnosis not present

## 2022-02-24 DIAGNOSIS — Z992 Dependence on renal dialysis: Secondary | ICD-10-CM | POA: Diagnosis not present

## 2022-02-24 DIAGNOSIS — D631 Anemia in chronic kidney disease: Secondary | ICD-10-CM | POA: Diagnosis not present

## 2022-02-24 DIAGNOSIS — E876 Hypokalemia: Secondary | ICD-10-CM | POA: Diagnosis not present

## 2022-02-27 ENCOUNTER — Encounter (HOSPITAL_COMMUNITY): Payer: Self-pay | Admitting: Vascular Surgery

## 2022-02-27 DIAGNOSIS — N2581 Secondary hyperparathyroidism of renal origin: Secondary | ICD-10-CM | POA: Diagnosis not present

## 2022-02-27 DIAGNOSIS — Z992 Dependence on renal dialysis: Secondary | ICD-10-CM | POA: Diagnosis not present

## 2022-02-27 DIAGNOSIS — N186 End stage renal disease: Secondary | ICD-10-CM | POA: Diagnosis not present

## 2022-02-27 DIAGNOSIS — R278 Other lack of coordination: Secondary | ICD-10-CM | POA: Diagnosis not present

## 2022-02-27 DIAGNOSIS — E876 Hypokalemia: Secondary | ICD-10-CM | POA: Diagnosis not present

## 2022-02-27 DIAGNOSIS — D631 Anemia in chronic kidney disease: Secondary | ICD-10-CM | POA: Diagnosis not present

## 2022-02-27 DIAGNOSIS — I69990 Apraxia following unspecified cerebrovascular disease: Secondary | ICD-10-CM | POA: Diagnosis not present

## 2022-02-27 NOTE — Anesthesia Preprocedure Evaluation (Addendum)
Anesthesia Evaluation  Patient identified by MRN, date of birth, ID band Patient awake    Reviewed: Allergy & Precautions, NPO status , Patient's Chart, lab work & pertinent test results  History of Anesthesia Complications Negative for: history of anesthetic complications  Airway Mallampati: II  TM Distance: >3 FB Neck ROM: Full    Dental  (+) Dental Advisory Given   Pulmonary Current Smoker and Patient abstained from smoking.,    Pulmonary exam normal        Cardiovascular hypertension, Normal cardiovascular exam+ Valvular Problems/Murmurs    '23 Carotid US - b/l 1-39% ICAS  '23 TTE - EF 55 to 60%. There is mild concentric left ventricular hypertrophy. Grade I diastolic dysfunction (impaired relaxation). Trivial MR. Mild-Mod TR.    Neuro/Psych PSYCHIATRIC DISORDERS Anxiety Depression CVA, Residual Symptoms    GI/Hepatic Neg liver ROS, GERD  Medicated and Controlled,  Endo/Other  diabetes, Type 2, Oral Hypoglycemic AgentsHyperthyroidism (on tapazole)   Renal/GU ESRF and DialysisRenal disease     Musculoskeletal negative musculoskeletal ROS (+)   Abdominal   Peds  Hematology negative hematology ROS (+)   Anesthesia Other Findings See PAT note  Reproductive/Obstetrics                           Anesthesia Physical Anesthesia Plan  ASA: 4  Anesthesia Plan: Regional   Post-op Pain Management: Tylenol PO (pre-op)* and Regional block*   Induction:   PONV Risk Score and Plan: 2 and Treatment may vary due to age or medical condition, Ondansetron and Propofol infusion  Airway Management Planned: Natural Airway and Simple Face Mask  Additional Equipment: None  Intra-op Plan:   Post-operative Plan:   Informed Consent: I have reviewed the patients History and Physical, chart, labs and discussed the procedure including the risks, benefits and alternatives for the proposed anesthesia with  the patient or authorized representative who has indicated his/her understanding and acceptance.       Plan Discussed with: CRNA, Anesthesiologist and Surgeon  Anesthesia Plan Comments: ( )      Anesthesia Quick Evaluation

## 2022-02-27 NOTE — Progress Notes (Signed)
Anesthesia Chart Review: Kathleene Hazel  Case: [id number] Date/Time: 02/28/22 0715   Procedures:      PERITONEAL DIALYSIS CATHETER REMOVAL     RIGHT ARTERIOVENOUS (AV) FISTULA VERSUS ARTERIOVENOUS GRAFT CREATION (Right)   Anesthesia type: General   Pre-op diagnosis: ESRD   Location: [facility] OR ROOM 12 / [place] OR   Surgeons: Waynetta Sandy, MD       DISCUSSION: Patient is a 81 year old female scheduled for the above procedure.  History includes smoking, HTN, DM2, ESRD (PD catheter placed 07/29/21, had some abdominal wall pain; switched to HD ~ 01/16/22 via [facility] due to "radial nerve palsy"), cryptogenic CVA (01/24/22), hyperthyroidism (on methimazole).  [place] admission 01/24/22-01/26/22 for CVA. She presented with RUE weakness that was first noted upon waking two weeks prior (~ 01/10/22). Also possible mild difficulty with word finding. She had initial evaluation by [facility] neurologist Dr. Brigitte Pulse for RUE weakness that morning and was referred to ED for CVA evaluation. MRI showed multiple embolic infarcts along the microhemorrhages with the distrubution concerning for cerebral amyloid angiopathy. MRI of the C-spine also performed and showed facet arthropathy with spinal stenosis without any evidence of cord compression. Neurology recommended TTE and permissive hypertension. ASA (and not DAPT) recommended given microhemorrhages on imaging. Recent event monitor did not show afib, but showed runs of atrial tachycardia and SVT. EP consulted for Loop recorder. Dr. Curt Bears noted, "in d/w Dr. Leonie Man, given finding of Extensive punctate chronic microhemorrhages throughout the cerebral and cerebellar hemispheres, she is NOT a longterm anticoagulation candidate. Though high clinical suspicion of Afib and if AFib is found, does think she would/could be anticoagulated short term to get to/past a watchman procedure as a long term, management strategy  So if AFib is found in her, discussion with Dr. Johnell Comings  team for potential watchman and involvement with neurology PRIOR to start of a/c would need to be done".  Dr. Donzetta Matters reached out to cardiology for preoperative input. On 02/01/22 Melina Copa, PA-C wrote, "Patient was recently seen by EP team 01/25/22 for consultation for stroke and loop recorder was implanted. Per chart, hx includes HTN, DM, ESRF previously on PD > HD recently, HLD, syncope, polyneuropathy, hyperthyroidism, GERD, bradycardia, and stroke....admitted recently 1/31-01/26/22 with strokes, imaging showing multiple infarcts of different times as well as multiple microhemorrhages. Repeat echo 01/2022 EF 55-60%, grade 1 DD, mild-moderate TR. TEE not performed. She was seen by EP and underwent loop recorder implantation. RCRI is calculated at 6.6% indicating moderate risk.    In light of recent events, vascular surgery is recommending cardiac clearance prior to general anesthesia for PD access. Given recent issues with unexplained syncope last OV as well as recent strokes and significant comorbidities, will route to Dr. Sallyanne Kuster as to whether he feels any additional cardiac testing is needed prior to surgery with general anesthesia." Dr. Sallyanne Kuster added on 02/02/22, "The concern remains so-far undiagnosed AFib. But I do not think additional workup or change in treatment will change her perioperative risk, so would go ahead with surgery without changes  She is to continue aspirin.  Given CVA (symptom onset ~ 01/10/22), discussed case with anesthesiologist Renold Don, MD who advised touching base with Dr. Donzetta Matters regarding urgency of procedure and also with neurology. Dr. Donzetta Matters indicated ideally the sooner patient has permanent HD access the less risk of catheter-related complications. As long as patient, can tolerate, he thinks surgery can be done under local and MAC. I called and discussed this with [facility] Neurologist Dr. Manuella Ghazi.  Ideally, for elective procedures, would advise to wait 90 days post stroke  to reduce perioperative CVA risk. However, if surgeon feels risks of delaying case outweighs the benefits, then would recommend continuing ASA and letting patient surgery within 90 days places her at some increased risk for CVA. I updated Dr. Fransisco Beau. He called and spoke with Dr. Donzetta Matters and case will be likely be rescheduled for ~ May 2023 unless there are new/acute concerns related to her catheters and more urgent surgery is indicated.     VS:  BP Readings from Last 3 Encounters:  02/01/22 133/75  01/26/22 (!) 143/85  12/13/21 (!) 90/58   Pulse Readings from Last 3 Encounters:  02/01/22 77  01/26/22 97  12/13/21 68     PROVIDERS: Lavone Orn, MD is PCP  Sanda Klein, MD is primary cardiologist Curt Bears, Will, MD is EP (Loop Recorder) Corliss Parish, MD is nephrologist Debara Pickett, MD is neurologist [facility], see [facility])   LABS: For day of surgery. As of 01/26/22, results include: Lab Results  Component Value Date   WBC 5.9 01/26/2022   HGB 7.9 (L) 01/26/2022   HCT 24.2 (L) 01/26/2022   PLT 156 01/26/2022   GLUCOSE 87 01/26/2022   ALT 24 01/24/2022   AST 24 01/24/2022   NA 140 01/26/2022   K 5.7 (H) 01/26/2022   CL 103 01/26/2022   CREATININE 6.66 (H) 01/26/2022   BUN 42 (H) 01/26/2022   CO2 26 01/26/2022   INR 1.0 01/24/2022   HGBA1C 4.9 01/25/2022     IMAGES: MRA Head 01/25/22: IMPRESSION: 1. No circle-of-Willis branch occlusion identified. 2. Intracranial atherosclerosis with moderate irregularity and stenosis of the PCA P3 segments, greater on the Left. Mild stenosis in the anterior circulation at the right ICA anterior genu and distal left ACA A2. 3. Susceptibility artifact related to Amyloid Angiopathy.  MRI C-spine 01/24/22: IMPRESSION: 1. Left worse than right facet arthropathy with prominent left osteophytosis/ligamentum flavum thickening resulting in mild spinal canal stenosis with slight rightward displacement of the cord but no frank cord  compression or definite cord signal abnormality. 2. Otherwise, minimal for age degenerative changes in the cervical spine without other high-grade spinal canal or neural foraminal stenosis.   MRI Brain 01/24/22: IMPRESSION: 1. Scattered infarcts in the bilateral cerebral hemispheres as detailed above, some of which appear acute while others appear more subacute. The largest infarct is in the left frontal lobe with involvement of the precentral gyrus, likely accounting for the patient's reported symptoms. Consider embolic etiology given involvement of multiple vascular territories. 2. Background of mild global parenchymal volume loss and advanced chronic white matter microangiopathy. Small remote infarcts in the left corona radiata and left cerebellar hemisphere. 3. Extensive punctate chronic microhemorrhages throughout the cerebral and cerebellar hemispheres predominantly peripheral in distribution concerning for cerebral amyloid angiopathy.   EKG: 01/24/22: Sinus rhythm with Premature atrial complexes Low voltage QRS Left posterior fascicular block Cannot rule out Anterior infarct , age undetermined Abnormal ECG No previous ECGs available Confirmed by Quintella Reichert [id number]) on 01/24/2022 5:14:42 PM   CV: Echo 01/25/22: IMPRESSIONS   1. Left ventricular ejection fraction, by estimation, is 55 to 60%. The  left ventricle has normal function. The left ventricle has no regional  wall motion abnormalities. There is mild concentric left ventricular  hypertrophy. Left ventricular diastolic  parameters are consistent with Grade I diastolic dysfunction (impaired  relaxation).   2. Right ventricular systolic function is normal. The right ventricular  size is normal. There  is normal pulmonary artery systolic pressure.   3. The mitral valve is normal in structure. Trivial mitral valve  regurgitation. No evidence of mitral stenosis.   4. Tricuspid valve regurgitation is mild to moderate.    5. The aortic valve is normal in structure. Aortic valve regurgitation is  not visualized. Aortic valve sclerosis/calcification is present, without  any evidence of aortic stenosis.   6. The inferior vena cava is normal in size with greater than 50%  respiratory variability, suggesting right atrial pressure of 3 mmHg.  - Comparison(s): No significant change from prior study.    Loop Recorder Implant 01/25/22: CONCLUSIONS:   1. Successful implantation of a [employer] Reveal LINQ implantable loop recorder for cryptogenic stroke  2. No early apparent complications.    Long term cardiac event monitor 12/20/21-01/01/22: The dominant rhythm was normal sinus with normal circadian variation. There are no meaningful episodes of bradycardia or pauses. There is no atrial fibrillation. Multiple episodes of nonsustained as well as sustained but brief supraventricular tachycardia (probably ectopic atrial tachycardia) are seen. The longest episode lasted for 18 seconds. These events occur both during daytime hours and the expected sleep hours. Also seen are episodes of brief wide-complex tachycardia. While many of these represent SVT with aberrant conduction, some are true episodes of nonsustained VT lasting for 6-7 beats. Relation between the patient's symptoms and the episodes of supraventricular tachycardia.   Abnormal event monitor due to the occurrence of relatively frequent episodes of nonsustained and briefly sustained ectopic atrial tachycardia which seem to correlate with the patient's symptoms.  The longest episode duration was 18 seconds. Atrial fibrillation was not recorded. Also noted are 2 very brief episodes of nonsustained ventricular tachycardia, maximum 7 beats.   Patch Wear Time:  12 days and 16 hours (2022-12-27T05:24:14-0500 to 2023-01-08T21:46:31-0500)   Patient had a min HR of 48 bpm, max HR of 231 bpm, and avg HR of 93 bpm. Predominant underlying rhythm was Sinus Rhythm. 2  Ventricular Tachycardia runs occurred, the run with the fastest interval lasting 7 beats with a max rate of 231 bpm, the longest  lasting 6 beats with an avg rate of 161 bpm. Some episodes of Ventricular Tachycardia conducted with possible aberrancy. 33 Supraventricular Tachycardia runs occurred, the run with the fastest interval lasting 13 beats with a max rate of 226 bpm, the  longest lasting 18.2 secs with an avg rate of 134 bpm. Isolated SVEs were occasional (1.0%, 15902), SVE Couplets were rare (<1.0%, 587), and SVE Triplets were rare (<1.0%, 45). Isolated VEs were rare (<1.0%), and no VE Couplets or VE Triplets were  present. Inverted QRS complexes possibly due to inverted placement of device.    US Carotid 01/25/22: Summary:  - Right Carotid: Velocities in the right ICA are consistent with a 1-39%  stenosis.  - Left Carotid: Velocities in the left ICA are consistent with a 1-39%  stenosis.  - Vertebrals: Bilateral vertebral arteries demonstrate antegrade flow.    Past Medical History:  Diagnosis Date   Cataracts, bilateral    Chronic kidney disease    Depression    Diabetes mellitus without complication ([place])    Hypertension     Past Surgical History:  Procedure Laterality Date   ABDOMINAL HYSTERECTOMY     vaginal   CARPAL TUNNEL RELEASE Right    COLONOSCOPY WITH PROPOFOL N/A 11/17/2014   Procedure: COLONOSCOPY WITH PROPOFOL;  Surgeon: Garlan Fair, MD;  Location: [facility];  Service: Endoscopy;  Laterality: N/A;   LOOP  RECORDER INSERTION N/A 01/25/2022   Procedure: LOOP RECORDER INSERTION;  Surgeon: Constance Haw, MD;  Location: [place] [facility];  Service: Cardiovascular;  Laterality: N/A;    MEDICATIONS: No current facility-administered medications for this encounter.    ACCU-CHEK SMARTVIEW test strip   acetaminophen (TYLENOL) 500 MG tablet   aspirin EC 81 MG tablet   atorvastatin (LIPITOR) 40 MG tablet   calcium carbonate (OSCAL) 1500 (600 Ca) MG TABS  tablet   EPINEPHrine 0.3 mg/0.3 mL IJ SOAJ injection   furosemide (LASIX) 40 MG tablet   gentamicin cream (GARAMYCIN) 0.1 %   glipiZIDE (GLUCOTROL XL) 2.5 MG 24 hr tablet   methimazole (TAPAZOLE) 5 MG tablet   midodrine (PROAMATINE) 5 MG tablet   Multiple Vitamin (MULTIVITAMIN WITH MINERALS) TABS tablet   omeprazole (PRILOSEC) 40 MG capsule   rOPINIRole (REQUIP) 3 MG tablet   traZODone (DESYREL) 50 MG tablet    Myra Gianotti, PA-C Surgical Short Stay/Anesthesiology [facility] Phone [phone] [facility] Phone [phone] 02/27/2022 3:41 PM

## 2022-03-01 ENCOUNTER — Ambulatory Visit (INDEPENDENT_AMBULATORY_CARE_PROVIDER_SITE_OTHER): Payer: Medicare Other

## 2022-03-01 DIAGNOSIS — D631 Anemia in chronic kidney disease: Secondary | ICD-10-CM | POA: Diagnosis not present

## 2022-03-01 DIAGNOSIS — N2581 Secondary hyperparathyroidism of renal origin: Secondary | ICD-10-CM | POA: Diagnosis not present

## 2022-03-01 DIAGNOSIS — E876 Hypokalemia: Secondary | ICD-10-CM | POA: Diagnosis not present

## 2022-03-01 DIAGNOSIS — N186 End stage renal disease: Secondary | ICD-10-CM | POA: Diagnosis not present

## 2022-03-01 DIAGNOSIS — R002 Palpitations: Secondary | ICD-10-CM

## 2022-03-01 DIAGNOSIS — Z992 Dependence on renal dialysis: Secondary | ICD-10-CM | POA: Diagnosis not present

## 2022-03-02 ENCOUNTER — Telehealth: Payer: Self-pay | Admitting: Cardiovascular Disease

## 2022-03-02 LAB — CUP PACEART REMOTE DEVICE CHECK
Date Time Interrogation Session: 20230308160821
Implantable Pulse Generator Implant Date: 20230201

## 2022-03-02 NOTE — Telephone Encounter (Signed)
Successful telephone encounter to patient to follow up on her inquiry related to being able to utilize Life Alert system with her loop recorder. Advised patient there was no documented interaction between Life Alert and loop. Patient appreciative of call. ?

## 2022-03-02 NOTE — Telephone Encounter (Signed)
Patient calling to say that they Life Alerts (fall detection device) can be use along withp have loop recorded. Please advise ?

## 2022-03-03 DIAGNOSIS — D631 Anemia in chronic kidney disease: Secondary | ICD-10-CM | POA: Diagnosis not present

## 2022-03-03 DIAGNOSIS — N2581 Secondary hyperparathyroidism of renal origin: Secondary | ICD-10-CM | POA: Diagnosis not present

## 2022-03-03 DIAGNOSIS — Z992 Dependence on renal dialysis: Secondary | ICD-10-CM | POA: Diagnosis not present

## 2022-03-03 DIAGNOSIS — E876 Hypokalemia: Secondary | ICD-10-CM | POA: Diagnosis not present

## 2022-03-03 DIAGNOSIS — N186 End stage renal disease: Secondary | ICD-10-CM | POA: Diagnosis not present

## 2022-03-06 DIAGNOSIS — N2581 Secondary hyperparathyroidism of renal origin: Secondary | ICD-10-CM | POA: Diagnosis not present

## 2022-03-06 DIAGNOSIS — E876 Hypokalemia: Secondary | ICD-10-CM | POA: Diagnosis not present

## 2022-03-06 DIAGNOSIS — D631 Anemia in chronic kidney disease: Secondary | ICD-10-CM | POA: Diagnosis not present

## 2022-03-06 DIAGNOSIS — Z992 Dependence on renal dialysis: Secondary | ICD-10-CM | POA: Diagnosis not present

## 2022-03-06 DIAGNOSIS — N186 End stage renal disease: Secondary | ICD-10-CM | POA: Diagnosis not present

## 2022-03-07 DIAGNOSIS — Z992 Dependence on renal dialysis: Secondary | ICD-10-CM | POA: Diagnosis not present

## 2022-03-07 DIAGNOSIS — N186 End stage renal disease: Secondary | ICD-10-CM | POA: Diagnosis not present

## 2022-03-07 DIAGNOSIS — T8249XA Other complication of vascular dialysis catheter, initial encounter: Secondary | ICD-10-CM | POA: Diagnosis not present

## 2022-03-08 DIAGNOSIS — N186 End stage renal disease: Secondary | ICD-10-CM | POA: Diagnosis not present

## 2022-03-08 DIAGNOSIS — N2581 Secondary hyperparathyroidism of renal origin: Secondary | ICD-10-CM | POA: Diagnosis not present

## 2022-03-08 DIAGNOSIS — D631 Anemia in chronic kidney disease: Secondary | ICD-10-CM | POA: Diagnosis not present

## 2022-03-08 DIAGNOSIS — Z992 Dependence on renal dialysis: Secondary | ICD-10-CM | POA: Diagnosis not present

## 2022-03-08 DIAGNOSIS — E876 Hypokalemia: Secondary | ICD-10-CM | POA: Diagnosis not present

## 2022-03-10 DIAGNOSIS — D631 Anemia in chronic kidney disease: Secondary | ICD-10-CM | POA: Diagnosis not present

## 2022-03-10 DIAGNOSIS — N2581 Secondary hyperparathyroidism of renal origin: Secondary | ICD-10-CM | POA: Diagnosis not present

## 2022-03-10 DIAGNOSIS — N186 End stage renal disease: Secondary | ICD-10-CM | POA: Diagnosis not present

## 2022-03-10 DIAGNOSIS — Z992 Dependence on renal dialysis: Secondary | ICD-10-CM | POA: Diagnosis not present

## 2022-03-10 DIAGNOSIS — E876 Hypokalemia: Secondary | ICD-10-CM | POA: Diagnosis not present

## 2022-03-13 DIAGNOSIS — E876 Hypokalemia: Secondary | ICD-10-CM | POA: Diagnosis not present

## 2022-03-13 DIAGNOSIS — N2581 Secondary hyperparathyroidism of renal origin: Secondary | ICD-10-CM | POA: Diagnosis not present

## 2022-03-13 DIAGNOSIS — D631 Anemia in chronic kidney disease: Secondary | ICD-10-CM | POA: Diagnosis not present

## 2022-03-13 DIAGNOSIS — Z992 Dependence on renal dialysis: Secondary | ICD-10-CM | POA: Diagnosis not present

## 2022-03-13 DIAGNOSIS — N186 End stage renal disease: Secondary | ICD-10-CM | POA: Diagnosis not present

## 2022-03-14 NOTE — Progress Notes (Signed)
[facility] Summary Report / Loop Recorder 

## 2022-03-15 DIAGNOSIS — N186 End stage renal disease: Secondary | ICD-10-CM | POA: Diagnosis not present

## 2022-03-15 DIAGNOSIS — E876 Hypokalemia: Secondary | ICD-10-CM | POA: Diagnosis not present

## 2022-03-15 DIAGNOSIS — Z992 Dependence on renal dialysis: Secondary | ICD-10-CM | POA: Diagnosis not present

## 2022-03-15 DIAGNOSIS — N2581 Secondary hyperparathyroidism of renal origin: Secondary | ICD-10-CM | POA: Diagnosis not present

## 2022-03-15 DIAGNOSIS — D631 Anemia in chronic kidney disease: Secondary | ICD-10-CM | POA: Diagnosis not present

## 2022-03-17 DIAGNOSIS — E876 Hypokalemia: Secondary | ICD-10-CM | POA: Diagnosis not present

## 2022-03-17 DIAGNOSIS — N2581 Secondary hyperparathyroidism of renal origin: Secondary | ICD-10-CM | POA: Diagnosis not present

## 2022-03-17 DIAGNOSIS — D631 Anemia in chronic kidney disease: Secondary | ICD-10-CM | POA: Diagnosis not present

## 2022-03-17 DIAGNOSIS — N186 End stage renal disease: Secondary | ICD-10-CM | POA: Diagnosis not present

## 2022-03-17 DIAGNOSIS — Z992 Dependence on renal dialysis: Secondary | ICD-10-CM | POA: Diagnosis not present

## 2022-03-20 DIAGNOSIS — Z992 Dependence on renal dialysis: Secondary | ICD-10-CM | POA: Diagnosis not present

## 2022-03-20 DIAGNOSIS — D631 Anemia in chronic kidney disease: Secondary | ICD-10-CM | POA: Diagnosis not present

## 2022-03-20 DIAGNOSIS — N2581 Secondary hyperparathyroidism of renal origin: Secondary | ICD-10-CM | POA: Diagnosis not present

## 2022-03-20 DIAGNOSIS — N186 End stage renal disease: Secondary | ICD-10-CM | POA: Diagnosis not present

## 2022-03-20 DIAGNOSIS — E876 Hypokalemia: Secondary | ICD-10-CM | POA: Diagnosis not present

## 2022-03-21 ENCOUNTER — Other Ambulatory Visit: Payer: Self-pay

## 2022-03-21 ENCOUNTER — Encounter: Payer: Self-pay | Admitting: Physician Assistant

## 2022-03-21 ENCOUNTER — Encounter: Payer: Medicare Other | Admitting: Physician Assistant

## 2022-03-21 NOTE — Progress Notes (Signed)
This encounter was created in error - please disregard.

## 2022-03-22 DIAGNOSIS — E876 Hypokalemia: Secondary | ICD-10-CM | POA: Diagnosis not present

## 2022-03-22 DIAGNOSIS — N186 End stage renal disease: Secondary | ICD-10-CM | POA: Diagnosis not present

## 2022-03-22 DIAGNOSIS — N2581 Secondary hyperparathyroidism of renal origin: Secondary | ICD-10-CM | POA: Diagnosis not present

## 2022-03-22 DIAGNOSIS — Z992 Dependence on renal dialysis: Secondary | ICD-10-CM | POA: Diagnosis not present

## 2022-03-22 DIAGNOSIS — D631 Anemia in chronic kidney disease: Secondary | ICD-10-CM | POA: Diagnosis not present

## 2022-03-23 DIAGNOSIS — R0683 Snoring: Secondary | ICD-10-CM | POA: Diagnosis not present

## 2022-03-23 DIAGNOSIS — E854 Organ-limited amyloidosis: Secondary | ICD-10-CM | POA: Diagnosis not present

## 2022-03-23 DIAGNOSIS — Z8673 Personal history of transient ischemic attack (TIA), and cerebral infarction without residual deficits: Secondary | ICD-10-CM | POA: Diagnosis not present

## 2022-03-23 DIAGNOSIS — M47812 Spondylosis without myelopathy or radiculopathy, cervical region: Secondary | ICD-10-CM | POA: Diagnosis not present

## 2022-03-23 DIAGNOSIS — N186 End stage renal disease: Secondary | ICD-10-CM | POA: Diagnosis not present

## 2022-03-23 DIAGNOSIS — I680 Cerebral amyloid angiopathy: Secondary | ICD-10-CM | POA: Diagnosis not present

## 2022-03-23 DIAGNOSIS — Z992 Dependence on renal dialysis: Secondary | ICD-10-CM | POA: Diagnosis not present

## 2022-03-24 DIAGNOSIS — N2581 Secondary hyperparathyroidism of renal origin: Secondary | ICD-10-CM | POA: Diagnosis not present

## 2022-03-24 DIAGNOSIS — I129 Hypertensive chronic kidney disease with stage 1 through stage 4 chronic kidney disease, or unspecified chronic kidney disease: Secondary | ICD-10-CM | POA: Diagnosis not present

## 2022-03-24 DIAGNOSIS — E876 Hypokalemia: Secondary | ICD-10-CM | POA: Diagnosis not present

## 2022-03-24 DIAGNOSIS — N186 End stage renal disease: Secondary | ICD-10-CM | POA: Diagnosis not present

## 2022-03-24 DIAGNOSIS — Z992 Dependence on renal dialysis: Secondary | ICD-10-CM | POA: Diagnosis not present

## 2022-03-24 DIAGNOSIS — D631 Anemia in chronic kidney disease: Secondary | ICD-10-CM | POA: Diagnosis not present

## 2022-03-27 DIAGNOSIS — N2581 Secondary hyperparathyroidism of renal origin: Secondary | ICD-10-CM | POA: Diagnosis not present

## 2022-03-27 DIAGNOSIS — N186 End stage renal disease: Secondary | ICD-10-CM | POA: Diagnosis not present

## 2022-03-27 DIAGNOSIS — Z992 Dependence on renal dialysis: Secondary | ICD-10-CM | POA: Diagnosis not present

## 2022-03-27 DIAGNOSIS — E1122 Type 2 diabetes mellitus with diabetic chronic kidney disease: Secondary | ICD-10-CM | POA: Diagnosis not present

## 2022-03-27 DIAGNOSIS — D631 Anemia in chronic kidney disease: Secondary | ICD-10-CM | POA: Diagnosis not present

## 2022-03-27 DIAGNOSIS — E876 Hypokalemia: Secondary | ICD-10-CM | POA: Diagnosis not present

## 2022-03-29 DIAGNOSIS — N2581 Secondary hyperparathyroidism of renal origin: Secondary | ICD-10-CM | POA: Diagnosis not present

## 2022-03-29 DIAGNOSIS — D631 Anemia in chronic kidney disease: Secondary | ICD-10-CM | POA: Diagnosis not present

## 2022-03-29 DIAGNOSIS — N186 End stage renal disease: Secondary | ICD-10-CM | POA: Diagnosis not present

## 2022-03-29 DIAGNOSIS — Z992 Dependence on renal dialysis: Secondary | ICD-10-CM | POA: Diagnosis not present

## 2022-03-29 DIAGNOSIS — E876 Hypokalemia: Secondary | ICD-10-CM | POA: Diagnosis not present

## 2022-03-29 DIAGNOSIS — E1122 Type 2 diabetes mellitus with diabetic chronic kidney disease: Secondary | ICD-10-CM | POA: Diagnosis not present

## 2022-03-31 DIAGNOSIS — E876 Hypokalemia: Secondary | ICD-10-CM | POA: Diagnosis not present

## 2022-03-31 DIAGNOSIS — E1122 Type 2 diabetes mellitus with diabetic chronic kidney disease: Secondary | ICD-10-CM | POA: Diagnosis not present

## 2022-03-31 DIAGNOSIS — N186 End stage renal disease: Secondary | ICD-10-CM | POA: Diagnosis not present

## 2022-03-31 DIAGNOSIS — N2581 Secondary hyperparathyroidism of renal origin: Secondary | ICD-10-CM | POA: Diagnosis not present

## 2022-03-31 DIAGNOSIS — Z992 Dependence on renal dialysis: Secondary | ICD-10-CM | POA: Diagnosis not present

## 2022-03-31 DIAGNOSIS — D631 Anemia in chronic kidney disease: Secondary | ICD-10-CM | POA: Diagnosis not present

## 2022-04-03 ENCOUNTER — Ambulatory Visit (INDEPENDENT_AMBULATORY_CARE_PROVIDER_SITE_OTHER): Payer: Medicare Other

## 2022-04-03 DIAGNOSIS — D631 Anemia in chronic kidney disease: Secondary | ICD-10-CM | POA: Diagnosis not present

## 2022-04-03 DIAGNOSIS — Z992 Dependence on renal dialysis: Secondary | ICD-10-CM | POA: Diagnosis not present

## 2022-04-03 DIAGNOSIS — N2581 Secondary hyperparathyroidism of renal origin: Secondary | ICD-10-CM | POA: Diagnosis not present

## 2022-04-03 DIAGNOSIS — N186 End stage renal disease: Secondary | ICD-10-CM | POA: Diagnosis not present

## 2022-04-03 DIAGNOSIS — R55 Syncope and collapse: Secondary | ICD-10-CM | POA: Diagnosis not present

## 2022-04-03 DIAGNOSIS — E876 Hypokalemia: Secondary | ICD-10-CM | POA: Diagnosis not present

## 2022-04-03 DIAGNOSIS — E1122 Type 2 diabetes mellitus with diabetic chronic kidney disease: Secondary | ICD-10-CM | POA: Diagnosis not present

## 2022-04-04 LAB — CUP PACEART REMOTE DEVICE CHECK
Date Time Interrogation Session: 20230410000704
Implantable Pulse Generator Implant Date: 20230201

## 2022-04-05 DIAGNOSIS — D631 Anemia in chronic kidney disease: Secondary | ICD-10-CM | POA: Diagnosis not present

## 2022-04-05 DIAGNOSIS — E1122 Type 2 diabetes mellitus with diabetic chronic kidney disease: Secondary | ICD-10-CM | POA: Diagnosis not present

## 2022-04-05 DIAGNOSIS — E876 Hypokalemia: Secondary | ICD-10-CM | POA: Diagnosis not present

## 2022-04-05 DIAGNOSIS — N186 End stage renal disease: Secondary | ICD-10-CM | POA: Diagnosis not present

## 2022-04-05 DIAGNOSIS — Z992 Dependence on renal dialysis: Secondary | ICD-10-CM | POA: Diagnosis not present

## 2022-04-05 DIAGNOSIS — N2581 Secondary hyperparathyroidism of renal origin: Secondary | ICD-10-CM | POA: Diagnosis not present

## 2022-04-07 DIAGNOSIS — D631 Anemia in chronic kidney disease: Secondary | ICD-10-CM | POA: Diagnosis not present

## 2022-04-07 DIAGNOSIS — N186 End stage renal disease: Secondary | ICD-10-CM | POA: Diagnosis not present

## 2022-04-07 DIAGNOSIS — Z992 Dependence on renal dialysis: Secondary | ICD-10-CM | POA: Diagnosis not present

## 2022-04-07 DIAGNOSIS — E876 Hypokalemia: Secondary | ICD-10-CM | POA: Diagnosis not present

## 2022-04-07 DIAGNOSIS — E1122 Type 2 diabetes mellitus with diabetic chronic kidney disease: Secondary | ICD-10-CM | POA: Diagnosis not present

## 2022-04-07 DIAGNOSIS — N2581 Secondary hyperparathyroidism of renal origin: Secondary | ICD-10-CM | POA: Diagnosis not present

## 2022-04-10 DIAGNOSIS — N186 End stage renal disease: Secondary | ICD-10-CM | POA: Diagnosis not present

## 2022-04-10 DIAGNOSIS — D631 Anemia in chronic kidney disease: Secondary | ICD-10-CM | POA: Diagnosis not present

## 2022-04-10 DIAGNOSIS — Z992 Dependence on renal dialysis: Secondary | ICD-10-CM | POA: Diagnosis not present

## 2022-04-10 DIAGNOSIS — E1122 Type 2 diabetes mellitus with diabetic chronic kidney disease: Secondary | ICD-10-CM | POA: Diagnosis not present

## 2022-04-10 DIAGNOSIS — E876 Hypokalemia: Secondary | ICD-10-CM | POA: Diagnosis not present

## 2022-04-10 DIAGNOSIS — N2581 Secondary hyperparathyroidism of renal origin: Secondary | ICD-10-CM | POA: Diagnosis not present

## 2022-04-12 DIAGNOSIS — N186 End stage renal disease: Secondary | ICD-10-CM | POA: Diagnosis not present

## 2022-04-12 DIAGNOSIS — N2581 Secondary hyperparathyroidism of renal origin: Secondary | ICD-10-CM | POA: Diagnosis not present

## 2022-04-12 DIAGNOSIS — E876 Hypokalemia: Secondary | ICD-10-CM | POA: Diagnosis not present

## 2022-04-12 DIAGNOSIS — Z992 Dependence on renal dialysis: Secondary | ICD-10-CM | POA: Diagnosis not present

## 2022-04-12 DIAGNOSIS — E1122 Type 2 diabetes mellitus with diabetic chronic kidney disease: Secondary | ICD-10-CM | POA: Diagnosis not present

## 2022-04-12 DIAGNOSIS — D631 Anemia in chronic kidney disease: Secondary | ICD-10-CM | POA: Diagnosis not present

## 2022-04-14 DIAGNOSIS — E1122 Type 2 diabetes mellitus with diabetic chronic kidney disease: Secondary | ICD-10-CM | POA: Diagnosis not present

## 2022-04-14 DIAGNOSIS — Z992 Dependence on renal dialysis: Secondary | ICD-10-CM | POA: Diagnosis not present

## 2022-04-14 DIAGNOSIS — N2581 Secondary hyperparathyroidism of renal origin: Secondary | ICD-10-CM | POA: Diagnosis not present

## 2022-04-14 DIAGNOSIS — D631 Anemia in chronic kidney disease: Secondary | ICD-10-CM | POA: Diagnosis not present

## 2022-04-14 DIAGNOSIS — N186 End stage renal disease: Secondary | ICD-10-CM | POA: Diagnosis not present

## 2022-04-14 DIAGNOSIS — E876 Hypokalemia: Secondary | ICD-10-CM | POA: Diagnosis not present

## 2022-04-17 DIAGNOSIS — N2581 Secondary hyperparathyroidism of renal origin: Secondary | ICD-10-CM | POA: Diagnosis not present

## 2022-04-17 DIAGNOSIS — Z992 Dependence on renal dialysis: Secondary | ICD-10-CM | POA: Diagnosis not present

## 2022-04-17 DIAGNOSIS — E876 Hypokalemia: Secondary | ICD-10-CM | POA: Diagnosis not present

## 2022-04-17 DIAGNOSIS — D631 Anemia in chronic kidney disease: Secondary | ICD-10-CM | POA: Diagnosis not present

## 2022-04-17 DIAGNOSIS — N186 End stage renal disease: Secondary | ICD-10-CM | POA: Diagnosis not present

## 2022-04-17 DIAGNOSIS — E1122 Type 2 diabetes mellitus with diabetic chronic kidney disease: Secondary | ICD-10-CM | POA: Diagnosis not present

## 2022-04-19 DIAGNOSIS — N186 End stage renal disease: Secondary | ICD-10-CM | POA: Diagnosis not present

## 2022-04-19 DIAGNOSIS — Z992 Dependence on renal dialysis: Secondary | ICD-10-CM | POA: Diagnosis not present

## 2022-04-19 DIAGNOSIS — N2581 Secondary hyperparathyroidism of renal origin: Secondary | ICD-10-CM | POA: Diagnosis not present

## 2022-04-19 DIAGNOSIS — E876 Hypokalemia: Secondary | ICD-10-CM | POA: Diagnosis not present

## 2022-04-19 DIAGNOSIS — D631 Anemia in chronic kidney disease: Secondary | ICD-10-CM | POA: Diagnosis not present

## 2022-04-19 DIAGNOSIS — E1122 Type 2 diabetes mellitus with diabetic chronic kidney disease: Secondary | ICD-10-CM | POA: Diagnosis not present

## 2022-04-19 NOTE — Progress Notes (Signed)
[facility] Summary Report / Loop Recorder 

## 2022-04-21 DIAGNOSIS — N186 End stage renal disease: Secondary | ICD-10-CM | POA: Diagnosis not present

## 2022-04-21 DIAGNOSIS — Z992 Dependence on renal dialysis: Secondary | ICD-10-CM | POA: Diagnosis not present

## 2022-04-21 DIAGNOSIS — E876 Hypokalemia: Secondary | ICD-10-CM | POA: Diagnosis not present

## 2022-04-21 DIAGNOSIS — E1122 Type 2 diabetes mellitus with diabetic chronic kidney disease: Secondary | ICD-10-CM | POA: Diagnosis not present

## 2022-04-21 DIAGNOSIS — N2581 Secondary hyperparathyroidism of renal origin: Secondary | ICD-10-CM | POA: Diagnosis not present

## 2022-04-21 DIAGNOSIS — D631 Anemia in chronic kidney disease: Secondary | ICD-10-CM | POA: Diagnosis not present

## 2022-04-23 DIAGNOSIS — I129 Hypertensive chronic kidney disease with stage 1 through stage 4 chronic kidney disease, or unspecified chronic kidney disease: Secondary | ICD-10-CM | POA: Diagnosis not present

## 2022-04-23 DIAGNOSIS — N186 End stage renal disease: Secondary | ICD-10-CM | POA: Diagnosis not present

## 2022-04-23 DIAGNOSIS — Z992 Dependence on renal dialysis: Secondary | ICD-10-CM | POA: Diagnosis not present

## 2022-04-24 DIAGNOSIS — N186 End stage renal disease: Secondary | ICD-10-CM | POA: Diagnosis not present

## 2022-04-24 DIAGNOSIS — D631 Anemia in chronic kidney disease: Secondary | ICD-10-CM | POA: Diagnosis not present

## 2022-04-24 DIAGNOSIS — N2581 Secondary hyperparathyroidism of renal origin: Secondary | ICD-10-CM | POA: Diagnosis not present

## 2022-04-24 DIAGNOSIS — Z992 Dependence on renal dialysis: Secondary | ICD-10-CM | POA: Diagnosis not present

## 2022-04-24 DIAGNOSIS — R52 Pain, unspecified: Secondary | ICD-10-CM | POA: Diagnosis not present

## 2022-04-26 DIAGNOSIS — R52 Pain, unspecified: Secondary | ICD-10-CM | POA: Diagnosis not present

## 2022-04-26 DIAGNOSIS — N186 End stage renal disease: Secondary | ICD-10-CM | POA: Diagnosis not present

## 2022-04-26 DIAGNOSIS — Z992 Dependence on renal dialysis: Secondary | ICD-10-CM | POA: Diagnosis not present

## 2022-04-26 DIAGNOSIS — D631 Anemia in chronic kidney disease: Secondary | ICD-10-CM | POA: Diagnosis not present

## 2022-04-26 DIAGNOSIS — N2581 Secondary hyperparathyroidism of renal origin: Secondary | ICD-10-CM | POA: Diagnosis not present

## 2022-04-28 ENCOUNTER — Telehealth: Payer: Self-pay | Admitting: *Deleted

## 2022-04-28 DIAGNOSIS — L237 Allergic contact dermatitis due to plants, except food: Secondary | ICD-10-CM | POA: Diagnosis not present

## 2022-04-28 DIAGNOSIS — N2581 Secondary hyperparathyroidism of renal origin: Secondary | ICD-10-CM | POA: Diagnosis not present

## 2022-04-28 DIAGNOSIS — R52 Pain, unspecified: Secondary | ICD-10-CM | POA: Diagnosis not present

## 2022-04-28 DIAGNOSIS — D631 Anemia in chronic kidney disease: Secondary | ICD-10-CM | POA: Diagnosis not present

## 2022-04-28 DIAGNOSIS — N186 End stage renal disease: Secondary | ICD-10-CM | POA: Diagnosis not present

## 2022-04-28 DIAGNOSIS — Z992 Dependence on renal dialysis: Secondary | ICD-10-CM | POA: Diagnosis not present

## 2022-04-28 NOTE — Telephone Encounter (Signed)
Patient called and states she has [place] she is using cortisone cream as directed by the dialysis center. She will let us know Monday if it is cleared up or if we have to reschedule her surgery since she does have it on her arms. ?

## 2022-05-01 ENCOUNTER — Other Ambulatory Visit: Payer: Self-pay

## 2022-05-01 ENCOUNTER — Encounter (HOSPITAL_COMMUNITY): Payer: Self-pay | Admitting: Vascular Surgery

## 2022-05-01 DIAGNOSIS — N2581 Secondary hyperparathyroidism of renal origin: Secondary | ICD-10-CM | POA: Diagnosis not present

## 2022-05-01 DIAGNOSIS — Z992 Dependence on renal dialysis: Secondary | ICD-10-CM | POA: Diagnosis not present

## 2022-05-01 DIAGNOSIS — R52 Pain, unspecified: Secondary | ICD-10-CM | POA: Diagnosis not present

## 2022-05-01 DIAGNOSIS — D631 Anemia in chronic kidney disease: Secondary | ICD-10-CM | POA: Diagnosis not present

## 2022-05-01 DIAGNOSIS — N186 End stage renal disease: Secondary | ICD-10-CM | POA: Diagnosis not present

## 2022-05-01 NOTE — Progress Notes (Signed)
Spoke with pt and her daughter, Kenney Houseman for pre-op call. They state she does not have a cardiac history. Pt is treated for HTN. States she had a mild stroke this past January. Pt is a type 2 diabetic. Only takes Glipizide if needed. Last A1C was 4.9 on 01/25/22. Instructed her not to take Glipizide in the AM.  ? ?Shower instructions given to [place] and she voiced understanding ?

## 2022-05-02 ENCOUNTER — Ambulatory Visit (HOSPITAL_COMMUNITY)
Admission: RE | Admit: 2022-05-02 | Discharge: 2022-05-02 | Disposition: A | Discharge status: Home / Self Care | Payer: Medicare Other | Source: Ambulatory Visit | Attending: Vascular Surgery | Admitting: Vascular Surgery

## 2022-05-02 ENCOUNTER — Ambulatory Visit (HOSPITAL_BASED_OUTPATIENT_CLINIC_OR_DEPARTMENT_OTHER): Payer: Medicare Other | Admitting: Certified Registered"

## 2022-05-02 ENCOUNTER — Other Ambulatory Visit: Payer: Self-pay

## 2022-05-02 ENCOUNTER — Ambulatory Visit (HOSPITAL_COMMUNITY): Payer: Medicare Other | Admitting: Certified Registered"

## 2022-05-02 ENCOUNTER — Encounter (HOSPITAL_COMMUNITY): Payer: Self-pay | Admitting: Vascular Surgery

## 2022-05-02 ENCOUNTER — Encounter (HOSPITAL_COMMUNITY)
Admission: RE | Disposition: A | Discharge status: Home / Self Care | Payer: Self-pay | Source: Ambulatory Visit | Attending: Vascular Surgery

## 2022-05-02 DIAGNOSIS — I132 Hypertensive heart and chronic kidney disease with heart failure and with stage 5 chronic kidney disease, or end stage renal disease: Secondary | ICD-10-CM | POA: Diagnosis not present

## 2022-05-02 DIAGNOSIS — F17210 Nicotine dependence, cigarettes, uncomplicated: Secondary | ICD-10-CM | POA: Diagnosis not present

## 2022-05-02 DIAGNOSIS — E1122 Type 2 diabetes mellitus with diabetic chronic kidney disease: Secondary | ICD-10-CM | POA: Diagnosis not present

## 2022-05-02 DIAGNOSIS — T8249XA Other complication of vascular dialysis catheter, initial encounter: Secondary | ICD-10-CM | POA: Diagnosis not present

## 2022-05-02 DIAGNOSIS — Z992 Dependence on renal dialysis: Secondary | ICD-10-CM

## 2022-05-02 DIAGNOSIS — N186 End stage renal disease: Secondary | ICD-10-CM | POA: Insufficient documentation

## 2022-05-02 DIAGNOSIS — I120 Hypertensive chronic kidney disease with stage 5 chronic kidney disease or end stage renal disease: Secondary | ICD-10-CM | POA: Diagnosis not present

## 2022-05-02 DIAGNOSIS — Z7984 Long term (current) use of oral hypoglycemic drugs: Secondary | ICD-10-CM | POA: Diagnosis not present

## 2022-05-02 DIAGNOSIS — N185 Chronic kidney disease, stage 5: Secondary | ICD-10-CM | POA: Diagnosis not present

## 2022-05-02 DIAGNOSIS — Y841 Kidney dialysis as the cause of abnormal reaction of the patient, or of later complication, without mention of misadventure at the time of the procedure: Secondary | ICD-10-CM | POA: Diagnosis not present

## 2022-05-02 HISTORY — DX: Gastro-esophageal reflux disease without esophagitis: K21.9

## 2022-05-02 HISTORY — DX: Thyrotoxicosis, unspecified without thyrotoxic crisis or storm: E05.90

## 2022-05-02 HISTORY — DX: Anemia, unspecified: D64.9

## 2022-05-02 HISTORY — DX: Pneumonia, unspecified organism: J18.9

## 2022-05-02 HISTORY — PX: AV FISTULA PLACEMENT: SHX1204

## 2022-05-02 HISTORY — DX: Cerebral infarction, unspecified: I63.9

## 2022-05-02 LAB — POCT I-STAT, CHEM 8
BUN: 32 mg/dL — ABNORMAL HIGH (ref 8–23)
Calcium, Ion: 1.17 mmol/L (ref 1.15–1.40)
Chloride: 101 mmol/L (ref 98–111)
Creatinine, Ser: 4 mg/dL — ABNORMAL HIGH (ref 0.44–1.00)
Glucose, Bld: 93 mg/dL (ref 70–99)
HCT: 38 % (ref 36.0–46.0)
Hemoglobin: 12.9 g/dL (ref 12.0–15.0)
Potassium: 4.2 mmol/L (ref 3.5–5.1)
Sodium: 138 mmol/L (ref 135–145)
TCO2: 28 mmol/L (ref 22–32)

## 2022-05-02 LAB — GLUCOSE, CAPILLARY
Glucose-Capillary: 107 mg/dL — ABNORMAL HIGH (ref 70–99)
Glucose-Capillary: 98 mg/dL (ref 70–99)

## 2022-05-02 SURGERY — REVISION, CATHETER, CAPD, LAPAROSCOPIC
Anesthesia: Monitor Anesthesia Care | Site: Arm Upper | Laterality: Right

## 2022-05-02 MED ORDER — MIDAZOLAM HCL 2 MG/2ML IJ SOLN
INTRAMUSCULAR | Status: DC | PRN
  Administered 2022-05-02: 1 mg via INTRAVENOUS

## 2022-05-02 MED ORDER — PROPOFOL 500 MG/50ML IV EMUL
INTRAVENOUS | Status: DC | PRN
  Administered 2022-05-02: 75 ug/kg/min via INTRAVENOUS

## 2022-05-02 MED ORDER — ONDANSETRON HCL 4 MG/2ML IJ SOLN
INTRAMUSCULAR | Status: DC | PRN
  Administered 2022-05-02: 4 mg via INTRAVENOUS

## 2022-05-02 MED ORDER — CHLORHEXIDINE GLUCONATE 0.12 % MT SOLN
15.0000 mL | Freq: Once | OROMUCOSAL | Status: AC
  Administered 2022-05-02: 15 mL via OROMUCOSAL
  Filled 2022-05-02: qty 15

## 2022-05-02 MED ORDER — CHLORHEXIDINE GLUCONATE 4 % EX LIQD: 60.0000 mL | Freq: Once | CUTANEOUS | Status: DC

## 2022-05-02 MED ORDER — FENTANYL CITRATE (PF) 250 MCG/5ML IJ SOLN
INTRAMUSCULAR | Status: DC | PRN
  Administered 2022-05-02 (×2): 50 ug via INTRAVENOUS

## 2022-05-02 MED ORDER — OXYCODONE-ACETAMINOPHEN 5-325 MG PO TABS: 1.0000 | ORAL_TABLET | Freq: Four times a day (QID) | ORAL | 0 refills | Status: DC | PRN

## 2022-05-02 MED ORDER — CEFAZOLIN SODIUM-DEXTROSE 2-4 GM/100ML-% IV SOLN
2.0000 g | INTRAVENOUS | Status: AC
  Administered 2022-05-02: 2 g via INTRAVENOUS
  Filled 2022-05-02: qty 100

## 2022-05-02 MED ORDER — HEPARIN 6000 UNIT IRRIGATION SOLUTION
Status: DC | PRN
  Administered 2022-05-02: 1

## 2022-05-02 MED ORDER — ORAL CARE MOUTH RINSE: 15.0000 mL | Freq: Once | OROMUCOSAL | Status: AC

## 2022-05-02 MED ORDER — OXYCODONE HCL 5 MG/5ML PO SOLN: 5.0000 mg | Freq: Once | ORAL | Status: DC | PRN

## 2022-05-02 MED ORDER — ACETAMINOPHEN 500 MG PO TABS
1000.0000 mg | ORAL_TABLET | Freq: Once | ORAL | Status: AC
  Administered 2022-05-02: 1000 mg via ORAL
  Filled 2022-05-02: qty 2

## 2022-05-02 MED ORDER — MEPIVACAINE HCL (PF) 1.5 % IJ SOLN
INTRAMUSCULAR | Status: DC | PRN
  Administered 2022-05-02: 20 mL via PERINEURAL

## 2022-05-02 MED ORDER — FENTANYL CITRATE (PF) 250 MCG/5ML IJ SOLN
INTRAMUSCULAR | Status: AC
  Filled 2022-05-02: qty 5

## 2022-05-02 MED ORDER — LIDOCAINE-EPINEPHRINE (PF) 1 %-1:200000 IJ SOLN
INTRAMUSCULAR | Status: DC | PRN
  Administered 2022-05-02: 6 mL

## 2022-05-02 MED ORDER — ONDANSETRON HCL 4 MG/2ML IJ SOLN: 4.0000 mg | Freq: Once | INTRAMUSCULAR | Status: DC | PRN

## 2022-05-02 MED ORDER — PROPOFOL 10 MG/ML IV BOLUS
INTRAVENOUS | Status: DC | PRN
  Administered 2022-05-02: 20 mg via INTRAVENOUS
  Administered 2022-05-02: 30 mg via INTRAVENOUS

## 2022-05-02 MED ORDER — HEPARIN 6000 UNIT IRRIGATION SOLUTION
Status: AC
  Filled 2022-05-02: qty 500

## 2022-05-02 MED ORDER — FENTANYL CITRATE (PF) 100 MCG/2ML IJ SOLN: 25.0000 ug | INTRAMUSCULAR | Status: DC | PRN

## 2022-05-02 MED ORDER — SODIUM CHLORIDE 0.9 % IV SOLN: INTRAVENOUS | Status: DC

## 2022-05-02 MED ORDER — MIDAZOLAM HCL 2 MG/2ML IJ SOLN
INTRAMUSCULAR | Status: AC
  Filled 2022-05-02: qty 2

## 2022-05-02 MED ORDER — LIDOCAINE-EPINEPHRINE (PF) 1 %-1:200000 IJ SOLN
INTRAMUSCULAR | Status: AC
  Filled 2022-05-02: qty 30

## 2022-05-02 MED ORDER — OXYCODONE HCL 5 MG PO TABS: 5.0000 mg | ORAL_TABLET | Freq: Once | ORAL | Status: DC | PRN

## 2022-05-02 MED ORDER — GLYCOPYRROLATE 0.2 MG/ML IJ SOLN
INTRAMUSCULAR | Status: DC | PRN
  Administered 2022-05-02: .2 mg via INTRAVENOUS

## 2022-05-02 MED ORDER — PHENYLEPHRINE HCL-NACL 20-0.9 MG/250ML-% IV SOLN
INTRAVENOUS | Status: DC | PRN
Start: 2022-05-02 — End: 2022-05-02
  Administered 2022-05-02: 10 ug/min via INTRAVENOUS

## 2022-05-02 MED ORDER — PROPOFOL 10 MG/ML IV BOLUS
INTRAVENOUS | Status: AC
  Filled 2022-05-02: qty 20

## 2022-05-02 MED ORDER — 0.9 % SODIUM CHLORIDE (POUR BTL) OPTIME
TOPICAL | Status: DC | PRN
  Administered 2022-05-02: 1000 mL

## 2022-05-02 MED ORDER — LACTATED RINGERS IV SOLN: INTRAVENOUS | Status: DC

## 2022-05-02 SURGICAL SUPPLY — 46 items
ADH SKN CLS APL DERMABOND .7 (GAUZE/BANDAGES/DRESSINGS) ×4
APL PRP STRL LF DISP 70% ISPRP (MISCELLANEOUS) ×2
ARMBAND PINK RESTRICT EXTREMIT (MISCELLANEOUS) ×4 IMPLANT
BAG COUNTER SPONGE SURGICOUNT (BAG) ×4 IMPLANT
BAG DECANTER FOR FLEXI CONT (MISCELLANEOUS) ×4 IMPLANT
BAG SPNG CNTER NS LX DISP (BAG) ×2
BIOPATCH RED 1 DISK 7.0 (GAUZE/BANDAGES/DRESSINGS) ×4 IMPLANT
BLADE 11 SAFETY STRL DISP (BLADE) ×4 IMPLANT
CANISTER SUCT 3000ML PPV (MISCELLANEOUS) ×4 IMPLANT
CHLORAPREP W/TINT 26 (MISCELLANEOUS) ×4 IMPLANT
CLIP LIGATING EXTRA MED SLVR (CLIP) ×4 IMPLANT
CLIP LIGATING EXTRA SM BLUE (MISCELLANEOUS) ×4 IMPLANT
COVER PROBE W GEL 5X96 (DRAPES) ×1 IMPLANT
COVER PROBE W/GEL STERILE (IV SETS) ×1 IMPLANT
COVER SURGICAL LIGHT HANDLE (MISCELLANEOUS) ×4 IMPLANT
DERMABOND ADVANCED (GAUZE/BANDAGES/DRESSINGS) ×2
DERMABOND ADVANCED .7 DNX12 (GAUZE/BANDAGES/DRESSINGS) ×3 IMPLANT
DRAPE ORTHO SPLIT 77X108 STRL (DRAPES) ×9
DRAPE SURG ORHT 6 SPLT 77X108 (DRAPES) IMPLANT
DRAPE UNIVERSAL (DRAPES) ×1 IMPLANT
ELECT REM PT RETURN 9FT ADLT (ELECTROSURGICAL) ×3
ELECTRODE REM PT RTRN 9FT ADLT (ELECTROSURGICAL) ×3 IMPLANT
GAUZE SPONGE 4X4 12PLY STRL (GAUZE/BANDAGES/DRESSINGS) ×4 IMPLANT
GLOVE BIO SURGEON STRL SZ7.5 (GLOVE) ×4 IMPLANT
GLOVE INDICATOR 6.5 STRL GRN (GLOVE) ×4 IMPLANT
GLOVE SURG SS PI 8.0 STRL IVOR (GLOVE) ×4 IMPLANT
GOWN STRL REUS W/ TWL LRG LVL3 (GOWN DISPOSABLE) ×9 IMPLANT
GOWN STRL REUS W/ TWL XL LVL3 (GOWN DISPOSABLE) ×3 IMPLANT
GOWN STRL REUS W/TWL LRG LVL3 (GOWN DISPOSABLE) ×9
GOWN STRL REUS W/TWL XL LVL3 (GOWN DISPOSABLE) ×3
KIT BASIN OR (CUSTOM PROCEDURE TRAY) ×4 IMPLANT
KIT TURNOVER KIT B (KITS) ×4 IMPLANT
NS IRRIG 1000ML POUR BTL (IV SOLUTION) ×4 IMPLANT
PACK CV ACCESS (CUSTOM PROCEDURE TRAY) ×4 IMPLANT
PAD ARMBOARD 7.5X6 YLW CONV (MISCELLANEOUS) ×8 IMPLANT
SLEEVE ENDOPATH XCEL 5M (ENDOMECHANICALS) ×4 IMPLANT
SUT MNCRL AB 4-0 PS2 18 (SUTURE) ×8 IMPLANT
SUT PROLENE 6 0 BV (SUTURE) ×5 IMPLANT
SUT VIC AB 3-0 SH 27 (SUTURE) ×3
SUT VIC AB 3-0 SH 27X BRD (SUTURE) ×3 IMPLANT
SUT VICRYL 0 UR6 27IN ABS (SUTURE) ×1 IMPLANT
SYR 20CC LL (SYRINGE) ×4 IMPLANT
TOWEL GREEN STERILE (TOWEL DISPOSABLE) ×4 IMPLANT
TOWEL GREEN STERILE FF (TOWEL DISPOSABLE) ×4 IMPLANT
UNDERPAD 30X36 HEAVY ABSORB (UNDERPADS AND DIAPERS) ×4 IMPLANT
WATER STERILE IRR 1000ML POUR (IV SOLUTION) ×4 IMPLANT

## 2022-05-02 NOTE — Anesthesia Procedure Notes (Signed)
Anesthesia Regional Block: Interscalene brachial plexus block  ? ?Pre-Anesthetic Checklist: , timeout performed,  Correct Patient, Correct Site, Correct Laterality,  Correct Procedure, Correct Position, site marked,  Risks and benefits discussed,  Surgical consent,  Pre-op evaluation,  At surgeon's request and post-op pain management ? ?Laterality: Right ? ?Prep: chloraprep     ?  ?Needles:  ?Injection technique: Single-shot ? ?Needle Type: Echogenic Needle   ? ? ?Needle Length: 5cm  ?Needle Gauge: 21  ? ? ? ?Additional Needles: ? ? ?Narrative:  ?Start time: 05/02/2022 7:20 AM ?End time: 05/02/2022 7:24 AM ?Injection made incrementally with aspirations every 5 mL. ? ?Performed by: Personally  ?Anesthesiologist: Audry Pili, MD ? ?Additional Notes: ?No pain on injection. No increased resistance to injection. Injection made in 5cc increments. Good needle visualization. Patient tolerated the procedure well. ? ? ? ? ?

## 2022-05-02 NOTE — Transfer of Care (Signed)
Immediate Anesthesia Transfer of Care Note ? ?Patient: GLADIOLA MADORE ? ?Procedure(s) Performed: PERITONEAL DIALYSIS CATHETER REMOVAL (Abdomen) ?RIGHT ARTERIOVENOUS (AV) FISTULA  CREATION (Right: Arm Upper) ? ?Patient Location: PACU ? ?Anesthesia Type:MAC ? ?Level of Consciousness: awake, alert , oriented, patient cooperative and responds to stimulation ? ?Airway & Oxygen Therapy: Patient Spontanous Breathing ? ?Post-op Assessment: Report given to RN and Post -op Vital signs reviewed and stable ? ?Post vital signs: Reviewed and stable ? ?Last Vitals:  ?Vitals Value Taken Time  ?BP 131/66 05/02/22 0859  ?Temp    ?Pulse 65 05/02/22 0859  ?Resp 19 05/02/22 0859  ?SpO2 99 % 05/02/22 0859  ?Vitals shown include unvalidated device data. ? ?Last Pain:  ?Vitals:  ? 05/02/22 0606  ?TempSrc: Oral  ?   ? ?  ? ?Complications: No notable events documented. ?

## 2022-05-02 NOTE — Op Note (Signed)
? ? ?Patient name: TERRIE HARING MRN: [id number] DOB: 1941/04/27 Sex: female ? ?05/02/2022 ?Pre-operative Diagnosis: esrd, non functioning pd catheter ?Post-operative diagnosis:  Same ?Surgeon:  Eda Paschal. Donzetta Matters, MD ?Assistant: Paulo Fruit, PA ?Procedure Performed: ?1.  Right brachial artery to cephalic vein AV fistula creation ?2.  Removal of peritoneal dialysis catheter ? ?Indications: 81 year old female with a history of end-stage renal disease currently dialyzing via tunneled dialysis catheter.  She has a previous peritoneal dialysis catheter which she does not use and is indicated for removal.  She is also indicated for permanent dialysis access. ? ?Findings: Cephalic vein on the right upper extremity measured approximately 3 mm in diameter and the brachial artery was similar sized.  At completion there was a strong thrill in the upper extremity fistula and a palpable radial artery pulse at the wrist both confirmed with Doppler.  Peritoneal dialysis catheter was removed and the fascial defect was closed. ?  ?Procedure:  The patient was identified in the holding area and taken to the operating room where she was placed supine on upper table and MAC anesthesia was induced.  She was gently prepped and draped in the right upper extremity in the abdomen in usual fashion, antibiotics were administered and a timeout was called.  Preoperative block had been placed in the right upper extremity and this was tested and noted to be intact.  Ultrasound was used we identified a cephalic vein and brachial artery just below the antecubital which were suitable.  A transverse incision was made we dissected out the vein and divided branches between ties marked for orientation transected distally and flushed with heparinized saline and spatulated.  The artery was dissected through the deep fascia clamped distally proximally opened longitudinally flushed with heparinized saline in distal direction.  The vein was sewn into side with  6-0 Prolene suture.  Prior completion without flushing maneuvers in all directions.  Upon completion there was a good thrill in the vein we did free up some of the soft tissue around the vein to allow to seat nicely.  We confirmed thrill with Doppler we also had a palpable radial artery pulse at the wrist confirmed with Doppler.  The wound was irrigated closed in layers of Vicryl and Monocryl.  Attention was turned to the abdomen where ultrasound was able to identify a cough just in the subcutaneous tissue and into the fascia.  Incision was made where previous incision had been healed.  We dissected down to the fascia of the cuff was removed.  The entire catheter was pulled out intact from the abdomen.  I then transected it removed 1 side from the exit site and was able to remove the other cuff to remove the catheter completely intact.  The wound was thoroughly irrigated.  The fascia was closed with 0 Vicryl on a UR 6 needle.  The wound again was irrigated flushing through the previous exit site and then I closed the skin overlying with 4-0 Monocryl and Dermabond placed at the skin level.  She was then awakened from anesthesia having tolerated procedure without any complication.  All counts were correct at completion ? ?Given the complexity of the case,  the assistant was necessary in order to expedient the procedure and safely perform the technical aspects of the operation.  The assistant provided traction and countertraction to assist with exposure of the artery and vein.  They also assisted with suture ligation of multiple venous branches.  They played a critical role in the anastomosis.  These skills, especially following the Prolene suture for the anastomosis, could not have been adequately performed by a scrub tech assistant.  ? ? ?EBL: 20cc ? ?Josey Forcier C. Donzetta Matters, MD ?[facility] ?Office: [phone] ?Pager: [phone] ? ? ?

## 2022-05-02 NOTE — Anesthesia Postprocedure Evaluation (Signed)
Anesthesia Post Note ? ?Patient: Monica Hunt ? ?Procedure(s) Performed: PERITONEAL DIALYSIS CATHETER REMOVAL (Abdomen) ?RIGHT ARTERIOVENOUS (AV) FISTULA  CREATION (Right: Arm Upper) ? ?  ? ?Patient location during evaluation: PACU ?Anesthesia Type: MAC ?Level of consciousness: awake and alert ?Pain management: pain level controlled ?Vital Signs Assessment: post-procedure vital signs reviewed and stable ?Respiratory status: spontaneous breathing, nonlabored ventilation and respiratory function stable ?Cardiovascular status: stable and blood pressure returned to baseline ?Anesthetic complications: no ? ? ?No notable events documented. ? ?Last Vitals:  ?Vitals:  ? 05/02/22 0900 05/02/22 0915  ?BP: 131/66 124/74  ?Pulse: 66 64  ?Resp: 15 11  ?Temp: (!) 36.1 ?C (!) 36.1 ?C  ?SpO2: 96% 97%  ?  ?Last Pain:  ?Vitals:  ? 05/02/22 0915  ?TempSrc:   ?PainSc: 0-No pain  ? ? ?  ?  ?  ?  ?  ?  ? ?Audry Pili ? ? ? ? ?

## 2022-05-02 NOTE — Discharge Instructions (Signed)
? ?[facility] ? ?Discharge Instructions ? ?AV Fistula or Graft Surgery for Dialysis Access ? ?Please refer to the following instructions for your post-procedure care. Your surgeon or physician assistant will discuss any changes with you. ? ?Activity ? ?You may drive the day following your surgery, if you are comfortable and no longer taking prescription pain medication. Resume full activity as the soreness in your incision resolves. ? ?Bathing/Showering ? ?You may shower after you go home. Keep your incision dry for 48 hours. Do not soak in a bathtub, hot tub, or swim until the incision heals completely. You may not shower if you have a hemodialysis catheter. ? ?Incision Care ? ?Clean your incision with mild soap and water after 48 hours. Pat the area dry with a clean towel. You do not need a bandage unless otherwise instructed. Do not apply any ointments or creams to your incision. You may have skin glue on your incision. Do not peel it off. It will come off on its own in about one week. Your arm may swell a bit after surgery. To reduce swelling use pillows to elevate your arm so it is above your heart. Your doctor will tell you if you need to lightly wrap your arm with an ACE bandage. ? ?Diet ? ?Resume your normal diet. There are not special food restrictions following this procedure. In order to heal from your surgery, it is CRITICAL to get adequate nutrition. Your body requires vitamins, minerals, and protein. Vegetables are the best source of vitamins and minerals. Vegetables also provide the perfect balance of protein. Processed food has little nutritional value, so try to avoid this. ? ?Medications ? ?Resume taking all of your medications. If your incision is causing pain, you may take over-the counter pain relievers such as acetaminophen (Tylenol). If you were prescribed a stronger pain medication, please be aware these medications can cause nausea and constipation. Prevent  nausea by taking the medication with a snack or meal. Avoid constipation by drinking plenty of fluids and eating foods with high amount of fiber, such as fruits, vegetables, and grains.  ?Do not take Tylenol if you are taking prescription pain medications. ? ?Follow up ?Your surgeon may want to see you in the office following your access surgery. If so, this will be arranged at the time of your surgery. ? ?Please call us immediately for any of the following conditions: ? ?Increased pain, redness, drainage (pus) from your incision site ?Fever of 101 degrees or higher ?Severe or worsening pain at your incision site ?Hand pain or numbness. ? ?Reduce your risk of vascular disease: ? ?Stop smoking. If you would like help, call [facility] at 1-800-QUIT-NOW 872-759-1274) or [facility] at [phone] ? ?Manage your cholesterol ?Maintain a desired weight ?Control your diabetes ?Keep your blood pressure down ? ?Dialysis ? ?It will take several weeks to several months for your new dialysis access to be ready for use. Your surgeon will determine when it is okay to use it. Your nephrologist will continue to direct your dialysis. You can continue to use your Permcath until your new access is ready for use. ? ? ?05/02/2022 ?Monica Hunt ?[id number] ?09-Feb-1941 ? ?Surgeon(s): ?Waynetta Sandy, MD ? ?Procedure(s): ?PERITONEAL DIALYSIS CATHETER REMOVAL ?RIGHT ARTERIOVENOUS (AV) FISTULA  CREATION ? ? May stick graft immediately  ? May stick graft on designated area only:   ?X Do not stick Right AV fistula for 12 weeks  ? ? ?If you have any questions,  please call the office at [phone]. ?

## 2022-05-02 NOTE — H&P (Signed)
?  ? ?  ?HPI: ?  ?Monica Hunt is a 81 y.o. female with end-stage renal disease previously had a PD catheter placed.  I evaluated to possibly revise this versus remove this in the past.  She is now on hemodialysis via tunneled dialysis catheter and needs a PD catheter removed.  She has never had any permanent dialysis access in her upper extremities.  She is right-hand dominant but recently had a stroke which is created significant weakness and dysfunction in the right upper extremity.  She is currently wearing a event monitor followed by cardiology.  She is not taking any blood thinners. ?  ?    ?Past Medical History:  ?Diagnosis Date  ? Cataracts, bilateral    ? Chronic kidney disease    ? Depression    ? Diabetes mellitus without complication ([place])    ? Hypertension    ?  ?     ?Family History  ?Problem Relation Age of Onset  ? Kidney cancer Mother    ? Lung cancer Father    ?  ?     ?Past Surgical History:  ?Procedure Laterality Date  ? ABDOMINAL HYSTERECTOMY      ?  vaginal  ? CARPAL TUNNEL RELEASE Right    ? COLONOSCOPY WITH PROPOFOL N/A 11/17/2014  ?  Procedure: COLONOSCOPY WITH PROPOFOL;  Surgeon: Garlan Fair, MD;  Location: [facility];  Service: Endoscopy;  Laterality: N/A;  ? LOOP RECORDER INSERTION N/A 01/25/2022  ?  Procedure: LOOP RECORDER INSERTION;  Surgeon: Constance Haw, MD;  Location: [facility];  Service: Cardiovascular;  Laterality: N/A;  ?  ?  ?Short Social History:  ?Social History  ?  ?     ?Tobacco Use  ? Smoking status: Every Day  ?    Packs/day: 0.25  ?    Years: 45.00  ?    Pack years: 11.25  ?    Types: Cigarettes  ? Smokeless tobacco: Never  ?Substance Use Topics  ? Alcohol use: No  ?  ?  ?     ?Allergies  ?Allergen Reactions  ? Bee Venom Anaphylaxis  ? Other Anaphylaxis  ?    Wasp sting  ?  ?  ?      ?Current Outpatient Medications  ?Medication Sig Dispense Refill  ? ACCU-CHEK SMARTVIEW test strip        ? acetaminophen (TYLENOL) 500 MG tablet Take 500-1,000 mg by  mouth every 6 (six) hours as needed for mild pain.      ? aspirin EC 81 MG tablet Take 1 tablet (81 mg total) by mouth daily. Swallow whole. 30 tablet 11  ? atorvastatin (LIPITOR) 40 MG tablet Take 40 mg by mouth daily.      ? calcium carbonate (OSCAL) 1500 (600 Ca) MG TABS tablet Take 600 mg of elemental calcium by mouth daily with breakfast.      ? EPINEPHrine 0.3 mg/0.3 mL IJ SOAJ injection Inject 0.3 mg into the muscle once.      ? furosemide (LASIX) 40 MG tablet Take 40 mg by mouth as needed for fluid.      ? gentamicin cream (GARAMYCIN) 0.1 % Apply 1 application topically daily as needed (skin irritation).      ? glipiZIDE (GLUCOTROL XL) 2.5 MG 24 hr tablet Take 2.5 mg by mouth daily with breakfast.      ? methimazole (TAPAZOLE) 5 MG tablet Take 5 mg by mouth daily.      ?  midodrine (PROAMATINE) 5 MG tablet Take 5 mg by mouth 3 (three) times daily as needed (if BP is low).      ? Multiple Vitamin (MULTIVITAMIN WITH MINERALS) TABS tablet Take 1 tablet by mouth daily.      ? omeprazole (PRILOSEC) 40 MG capsule Take 40 mg by mouth daily.      ? rOPINIRole (REQUIP) 3 MG tablet Take 3 mg by mouth at bedtime. Take 1 tab 1 to 3 hours before bedtime      ? traZODone (DESYREL) 50 MG tablet Take 100 mg by mouth at bedtime. Take 1-2 tabs daily at bedtime      ?  ?No current facility-administered medications for this visit.  ?  ?  ?Review of Systems  ?Constitutional:  Constitutional negative. ?HENT: HENT negative.  ?Eyes: Eyes negative.  ?Respiratory: Respiratory negative.  ?Cardiovascular: Cardiovascular negative.  ?GI: Gastrointestinal negative.  ?Musculoskeletal: Musculoskeletal negative.  ?Skin: Skin negative.  ?Neurological: Positive for focal weakness.  ?Hematologic: Hematologic/lymphatic negative.  ?Psychiatric: Psychiatric negative.   ?  ?  ?  ?Objective:  ? ?Vitals:  ? 05/02/22 0606  ?BP: (!) 145/74  ?Pulse: 63  ?Resp: 17  ?Temp: 97.6 ?F (36.4 ?C)  ?SpO2: 98%  ? ? ?  ?Physical Exam ?HENT:  ?   Head:  Normocephalic.  ?   Nose:  ?   Comments: Wearing a mask ?Eyes:  ?   Pupils: Pupils are equal, round, and reactive to light.  ?Neck:  ?   Comments: Tunneled dialysis catheter on the right ?Cardiovascular:  ?   Pulses: Normal pulses.  ?Pulmonary:  ?   Effort: Pulmonary effort is normal.  ?Abdominal:  ?   General: Abdomen is flat.  ?   Palpations: Abdomen is soft.  ?Musculoskeletal:     ?   General: Normal range of motion.  ?   Cervical back: Normal range of motion and neck supple.  ?Skin: ?   General: Skin is warm.  ?   Capillary Refill: Capillary refill takes less than 2 seconds.  ?Neurological:  ?   Mental Status: She is alert.  ?   Comments: Right upper extremity weakness  ?Psychiatric:     ?   Mood and Affect: Mood normal.  ?  ?  ?Data: ?No studies today ?  ?   ?Assessment/Plan:  ?  ?81 year old female with previous PD catheter placement now on dialysis via tunneled dialysis catheter.  We will plan to remove the PD catheter in place right upper extremity dialysis access fistula versus graft.  She does not have any vein mapping but on exam today appears she would likely need a graft.  plan for pd cath removal and right arm avf/avg today in OR.  ?  ?  ?  ?Waynetta Sandy MD ?[facility] ?  ?

## 2022-05-03 ENCOUNTER — Encounter (HOSPITAL_COMMUNITY): Payer: Self-pay | Admitting: Vascular Surgery

## 2022-05-03 DIAGNOSIS — R52 Pain, unspecified: Secondary | ICD-10-CM | POA: Diagnosis not present

## 2022-05-03 DIAGNOSIS — N2581 Secondary hyperparathyroidism of renal origin: Secondary | ICD-10-CM | POA: Diagnosis not present

## 2022-05-03 DIAGNOSIS — N186 End stage renal disease: Secondary | ICD-10-CM | POA: Diagnosis not present

## 2022-05-03 DIAGNOSIS — D631 Anemia in chronic kidney disease: Secondary | ICD-10-CM | POA: Diagnosis not present

## 2022-05-03 DIAGNOSIS — Z992 Dependence on renal dialysis: Secondary | ICD-10-CM | POA: Diagnosis not present

## 2022-05-05 DIAGNOSIS — Z992 Dependence on renal dialysis: Secondary | ICD-10-CM | POA: Diagnosis not present

## 2022-05-05 DIAGNOSIS — N2581 Secondary hyperparathyroidism of renal origin: Secondary | ICD-10-CM | POA: Diagnosis not present

## 2022-05-05 DIAGNOSIS — N186 End stage renal disease: Secondary | ICD-10-CM | POA: Diagnosis not present

## 2022-05-05 DIAGNOSIS — R52 Pain, unspecified: Secondary | ICD-10-CM | POA: Diagnosis not present

## 2022-05-05 DIAGNOSIS — D631 Anemia in chronic kidney disease: Secondary | ICD-10-CM | POA: Diagnosis not present

## 2022-05-08 ENCOUNTER — Ambulatory Visit (INDEPENDENT_AMBULATORY_CARE_PROVIDER_SITE_OTHER): Payer: Medicare Other

## 2022-05-08 DIAGNOSIS — R55 Syncope and collapse: Secondary | ICD-10-CM

## 2022-05-08 DIAGNOSIS — Z992 Dependence on renal dialysis: Secondary | ICD-10-CM | POA: Diagnosis not present

## 2022-05-08 DIAGNOSIS — R52 Pain, unspecified: Secondary | ICD-10-CM | POA: Diagnosis not present

## 2022-05-08 DIAGNOSIS — D631 Anemia in chronic kidney disease: Secondary | ICD-10-CM | POA: Diagnosis not present

## 2022-05-08 DIAGNOSIS — N186 End stage renal disease: Secondary | ICD-10-CM | POA: Diagnosis not present

## 2022-05-08 DIAGNOSIS — N2581 Secondary hyperparathyroidism of renal origin: Secondary | ICD-10-CM | POA: Diagnosis not present

## 2022-05-09 LAB — CUP PACEART REMOTE DEVICE CHECK
Date Time Interrogation Session: 20230513000808
Implantable Pulse Generator Implant Date: 20230201

## 2022-05-10 DIAGNOSIS — D631 Anemia in chronic kidney disease: Secondary | ICD-10-CM | POA: Diagnosis not present

## 2022-05-10 DIAGNOSIS — N2581 Secondary hyperparathyroidism of renal origin: Secondary | ICD-10-CM | POA: Diagnosis not present

## 2022-05-10 DIAGNOSIS — R52 Pain, unspecified: Secondary | ICD-10-CM | POA: Diagnosis not present

## 2022-05-10 DIAGNOSIS — Z992 Dependence on renal dialysis: Secondary | ICD-10-CM | POA: Diagnosis not present

## 2022-05-10 DIAGNOSIS — N186 End stage renal disease: Secondary | ICD-10-CM | POA: Diagnosis not present

## 2022-05-12 DIAGNOSIS — D631 Anemia in chronic kidney disease: Secondary | ICD-10-CM | POA: Diagnosis not present

## 2022-05-12 DIAGNOSIS — R52 Pain, unspecified: Secondary | ICD-10-CM | POA: Diagnosis not present

## 2022-05-12 DIAGNOSIS — Z992 Dependence on renal dialysis: Secondary | ICD-10-CM | POA: Diagnosis not present

## 2022-05-12 DIAGNOSIS — N186 End stage renal disease: Secondary | ICD-10-CM | POA: Diagnosis not present

## 2022-05-12 DIAGNOSIS — N2581 Secondary hyperparathyroidism of renal origin: Secondary | ICD-10-CM | POA: Diagnosis not present

## 2022-05-15 DIAGNOSIS — Z992 Dependence on renal dialysis: Secondary | ICD-10-CM | POA: Diagnosis not present

## 2022-05-15 DIAGNOSIS — N186 End stage renal disease: Secondary | ICD-10-CM | POA: Diagnosis not present

## 2022-05-15 DIAGNOSIS — D631 Anemia in chronic kidney disease: Secondary | ICD-10-CM | POA: Diagnosis not present

## 2022-05-15 DIAGNOSIS — R52 Pain, unspecified: Secondary | ICD-10-CM | POA: Diagnosis not present

## 2022-05-15 DIAGNOSIS — N2581 Secondary hyperparathyroidism of renal origin: Secondary | ICD-10-CM | POA: Diagnosis not present

## 2022-05-17 DIAGNOSIS — D631 Anemia in chronic kidney disease: Secondary | ICD-10-CM | POA: Diagnosis not present

## 2022-05-17 DIAGNOSIS — N2581 Secondary hyperparathyroidism of renal origin: Secondary | ICD-10-CM | POA: Diagnosis not present

## 2022-05-17 DIAGNOSIS — N186 End stage renal disease: Secondary | ICD-10-CM | POA: Diagnosis not present

## 2022-05-17 DIAGNOSIS — Z992 Dependence on renal dialysis: Secondary | ICD-10-CM | POA: Diagnosis not present

## 2022-05-17 DIAGNOSIS — R52 Pain, unspecified: Secondary | ICD-10-CM | POA: Diagnosis not present

## 2022-05-19 DIAGNOSIS — D631 Anemia in chronic kidney disease: Secondary | ICD-10-CM | POA: Diagnosis not present

## 2022-05-19 DIAGNOSIS — R52 Pain, unspecified: Secondary | ICD-10-CM | POA: Diagnosis not present

## 2022-05-19 DIAGNOSIS — Z992 Dependence on renal dialysis: Secondary | ICD-10-CM | POA: Diagnosis not present

## 2022-05-19 DIAGNOSIS — N186 End stage renal disease: Secondary | ICD-10-CM | POA: Diagnosis not present

## 2022-05-19 DIAGNOSIS — N2581 Secondary hyperparathyroidism of renal origin: Secondary | ICD-10-CM | POA: Diagnosis not present

## 2022-05-22 DIAGNOSIS — N186 End stage renal disease: Secondary | ICD-10-CM | POA: Diagnosis not present

## 2022-05-22 DIAGNOSIS — D631 Anemia in chronic kidney disease: Secondary | ICD-10-CM | POA: Diagnosis not present

## 2022-05-22 DIAGNOSIS — N2581 Secondary hyperparathyroidism of renal origin: Secondary | ICD-10-CM | POA: Diagnosis not present

## 2022-05-22 DIAGNOSIS — Z992 Dependence on renal dialysis: Secondary | ICD-10-CM | POA: Diagnosis not present

## 2022-05-22 DIAGNOSIS — R52 Pain, unspecified: Secondary | ICD-10-CM | POA: Diagnosis not present

## 2022-05-24 DIAGNOSIS — D631 Anemia in chronic kidney disease: Secondary | ICD-10-CM | POA: Diagnosis not present

## 2022-05-24 DIAGNOSIS — M8580 Other specified disorders of bone density and structure, unspecified site: Secondary | ICD-10-CM | POA: Diagnosis not present

## 2022-05-24 DIAGNOSIS — E1122 Type 2 diabetes mellitus with diabetic chronic kidney disease: Secondary | ICD-10-CM | POA: Diagnosis not present

## 2022-05-24 DIAGNOSIS — R52 Pain, unspecified: Secondary | ICD-10-CM | POA: Diagnosis not present

## 2022-05-24 DIAGNOSIS — N186 End stage renal disease: Secondary | ICD-10-CM | POA: Diagnosis not present

## 2022-05-24 DIAGNOSIS — I10 Essential (primary) hypertension: Secondary | ICD-10-CM | POA: Diagnosis not present

## 2022-05-24 DIAGNOSIS — E782 Mixed hyperlipidemia: Secondary | ICD-10-CM | POA: Diagnosis not present

## 2022-05-24 DIAGNOSIS — N184 Chronic kidney disease, stage 4 (severe): Secondary | ICD-10-CM | POA: Diagnosis not present

## 2022-05-24 DIAGNOSIS — G4700 Insomnia, unspecified: Secondary | ICD-10-CM | POA: Diagnosis not present

## 2022-05-24 DIAGNOSIS — N2581 Secondary hyperparathyroidism of renal origin: Secondary | ICD-10-CM | POA: Diagnosis not present

## 2022-05-24 DIAGNOSIS — Z992 Dependence on renal dialysis: Secondary | ICD-10-CM | POA: Diagnosis not present

## 2022-05-24 DIAGNOSIS — I129 Hypertensive chronic kidney disease with stage 1 through stage 4 chronic kidney disease, or unspecified chronic kidney disease: Secondary | ICD-10-CM | POA: Diagnosis not present

## 2022-05-24 DIAGNOSIS — K219 Gastro-esophageal reflux disease without esophagitis: Secondary | ICD-10-CM | POA: Diagnosis not present

## 2022-05-26 DIAGNOSIS — Z992 Dependence on renal dialysis: Secondary | ICD-10-CM | POA: Diagnosis not present

## 2022-05-26 DIAGNOSIS — N2581 Secondary hyperparathyroidism of renal origin: Secondary | ICD-10-CM | POA: Diagnosis not present

## 2022-05-26 DIAGNOSIS — D631 Anemia in chronic kidney disease: Secondary | ICD-10-CM | POA: Diagnosis not present

## 2022-05-26 DIAGNOSIS — N186 End stage renal disease: Secondary | ICD-10-CM | POA: Diagnosis not present

## 2022-05-26 NOTE — Progress Notes (Signed)
[facility] Summary Report / Loop Recorder 

## 2022-05-29 DIAGNOSIS — N186 End stage renal disease: Secondary | ICD-10-CM | POA: Diagnosis not present

## 2022-05-29 DIAGNOSIS — N2581 Secondary hyperparathyroidism of renal origin: Secondary | ICD-10-CM | POA: Diagnosis not present

## 2022-05-29 DIAGNOSIS — D631 Anemia in chronic kidney disease: Secondary | ICD-10-CM | POA: Diagnosis not present

## 2022-05-29 DIAGNOSIS — Z992 Dependence on renal dialysis: Secondary | ICD-10-CM | POA: Diagnosis not present

## 2022-05-30 ENCOUNTER — Other Ambulatory Visit: Payer: Self-pay | Admitting: *Deleted

## 2022-05-30 DIAGNOSIS — N186 End stage renal disease: Secondary | ICD-10-CM

## 2022-05-31 DIAGNOSIS — Z992 Dependence on renal dialysis: Secondary | ICD-10-CM | POA: Diagnosis not present

## 2022-05-31 DIAGNOSIS — N186 End stage renal disease: Secondary | ICD-10-CM | POA: Diagnosis not present

## 2022-05-31 DIAGNOSIS — N2581 Secondary hyperparathyroidism of renal origin: Secondary | ICD-10-CM | POA: Diagnosis not present

## 2022-05-31 DIAGNOSIS — D631 Anemia in chronic kidney disease: Secondary | ICD-10-CM | POA: Diagnosis not present

## 2022-05-31 NOTE — Progress Notes (Deleted)
Duplicate note

## 2022-06-02 DIAGNOSIS — N2581 Secondary hyperparathyroidism of renal origin: Secondary | ICD-10-CM | POA: Diagnosis not present

## 2022-06-02 DIAGNOSIS — D631 Anemia in chronic kidney disease: Secondary | ICD-10-CM | POA: Diagnosis not present

## 2022-06-02 DIAGNOSIS — N186 End stage renal disease: Secondary | ICD-10-CM | POA: Diagnosis not present

## 2022-06-02 DIAGNOSIS — Z992 Dependence on renal dialysis: Secondary | ICD-10-CM | POA: Diagnosis not present

## 2022-06-05 DIAGNOSIS — Z992 Dependence on renal dialysis: Secondary | ICD-10-CM | POA: Diagnosis not present

## 2022-06-05 DIAGNOSIS — N2581 Secondary hyperparathyroidism of renal origin: Secondary | ICD-10-CM | POA: Diagnosis not present

## 2022-06-05 DIAGNOSIS — D631 Anemia in chronic kidney disease: Secondary | ICD-10-CM | POA: Diagnosis not present

## 2022-06-05 DIAGNOSIS — N186 End stage renal disease: Secondary | ICD-10-CM | POA: Diagnosis not present

## 2022-06-06 DIAGNOSIS — D631 Anemia in chronic kidney disease: Secondary | ICD-10-CM | POA: Diagnosis not present

## 2022-06-06 DIAGNOSIS — Z992 Dependence on renal dialysis: Secondary | ICD-10-CM | POA: Diagnosis not present

## 2022-06-06 DIAGNOSIS — N186 End stage renal disease: Secondary | ICD-10-CM | POA: Diagnosis not present

## 2022-06-06 DIAGNOSIS — N2581 Secondary hyperparathyroidism of renal origin: Secondary | ICD-10-CM | POA: Diagnosis not present

## 2022-06-07 ENCOUNTER — Ambulatory Visit (HOSPITAL_COMMUNITY)
Admission: RE | Admit: 2022-06-07 | Discharge: 2022-06-07 | Disposition: A | Discharge status: Home / Self Care | Payer: Medicare Other | Source: Ambulatory Visit | Attending: Vascular Surgery | Admitting: Vascular Surgery

## 2022-06-07 ENCOUNTER — Ambulatory Visit (INDEPENDENT_AMBULATORY_CARE_PROVIDER_SITE_OTHER): Payer: Medicare Other | Admitting: Physician Assistant

## 2022-06-07 VITALS — BP 108/62 | HR 83 | Temp 97.9°F | Resp 18 | Ht 62.0 in | Wt 114.7 lb

## 2022-06-07 DIAGNOSIS — N186 End stage renal disease: Secondary | ICD-10-CM

## 2022-06-07 DIAGNOSIS — Z992 Dependence on renal dialysis: Secondary | ICD-10-CM | POA: Diagnosis not present

## 2022-06-07 NOTE — Progress Notes (Signed)
Postoperative Access Visit   History of Present Illness   Monica Hunt is a 81 y.o. year old female who presents for postoperative follow-up for: 1. Right brachial artery to cephalic vein AV fistula creation 2. Removal of peritoneal dialysis catheter by Dr. Donzetta Matters on 05/02/22. The patient's wounds are healed and doing well.  The patient notes no steal symptoms.  She currently dialyzes via right IJ TDC.   Physical Examination   Vitals:   06/07/22 1217  BP: 108/62  Pulse: 83  Resp: 18  Temp: 97.9 F (36.6 C)  TempSrc: Temporal  SpO2: 98%  Weight: 114 lb 11.2 oz (52 kg)  Height: 5\' 2"  (1.575 m)   Body mass index is 20.98 kg/m.  right arm Incision is healed, palpable radial pulse, hand grip is 5/5, sensation in digits is intact, fistula has a palpable thrill, bruit can be auscultated    Non invasive vascular lab:  Findings:  +--------------------+----------+-----------------+---------+  AVF                 PSV (cm/s)Flow Vol (mL/min)Comments   +--------------------+----------+-----------------+---------+  Native artery inflow   111           586       resistive  +--------------------+----------+-----------------+---------+  AVF Anastomosis        797                                +--------------------+----------+-----------------+---------+      +------------+----------+-------------+----------+------------------+  OUTFLOW VEINPSV (cm/s)Diameter (cm)Depth (cm)     Describe       +------------+----------+-------------+----------+------------------+  Shoulder       139        0.57        0.94                       +------------+----------+-------------+----------+------------------+  Prox UA        133        0.55        0.49                       +------------+----------+-------------+----------+------------------+  Mid UA         204        0.71        0.44                        +------------+----------+-------------+----------+------------------+  Dist UA        182        0.62        0.38    competing branch   +------------+----------+-------------+----------+------------------+  AC Fossa       790        0.14        0.92   change in Diameter  +------------+----------+-------------+----------+------------------+       Summary:  Patent arteriovenous fistula with one competing branch observed.     Elevated velocities and narrow vessel observed involving the immediate  venous  outflow segment.   *See table(s) above for measurements and observations.     Diagnosing physician: Monica Hunt     Medical Decision Making   Monica Hunt is a 81 y.o. year old female who presents s/p 1. Right brachial artery to cephalic vein AV fistula creation 2. Removal of peritoneal dialysis catheter by Dr. Donzetta Matters on 05/02/22.  Duplex today  shows patent AV fistula with elevated velocities and a narrow vessel involving the immediate outflow segment.  Patent fistula without signs or symptoms of steal syndrome The patient's access will be ready for use after August 1. The patient's tunneled dialysis catheter can be removed when Nephrology is comfortable with the performance of the right brachiocephalic fistula Elevated inflow velocities of the fistula likely due to the right IJ TDC using the same vein.  Educated patient to use the arm and exercise it to ensure full fistula maturity The patient may follow up on a prn basis   Vicente Serene, PA-C [facility]: [phone]  [facility]inic [facility]: Yetta Barre

## 2022-06-09 DIAGNOSIS — D631 Anemia in chronic kidney disease: Secondary | ICD-10-CM | POA: Diagnosis not present

## 2022-06-09 DIAGNOSIS — N186 End stage renal disease: Secondary | ICD-10-CM | POA: Diagnosis not present

## 2022-06-09 DIAGNOSIS — Z992 Dependence on renal dialysis: Secondary | ICD-10-CM | POA: Diagnosis not present

## 2022-06-09 DIAGNOSIS — N2581 Secondary hyperparathyroidism of renal origin: Secondary | ICD-10-CM | POA: Diagnosis not present

## 2022-06-12 ENCOUNTER — Ambulatory Visit (INDEPENDENT_AMBULATORY_CARE_PROVIDER_SITE_OTHER): Payer: Medicare Other

## 2022-06-12 DIAGNOSIS — R55 Syncope and collapse: Secondary | ICD-10-CM | POA: Diagnosis not present

## 2022-06-12 DIAGNOSIS — Z992 Dependence on renal dialysis: Secondary | ICD-10-CM | POA: Diagnosis not present

## 2022-06-12 DIAGNOSIS — D631 Anemia in chronic kidney disease: Secondary | ICD-10-CM | POA: Diagnosis not present

## 2022-06-12 DIAGNOSIS — N2581 Secondary hyperparathyroidism of renal origin: Secondary | ICD-10-CM | POA: Diagnosis not present

## 2022-06-12 DIAGNOSIS — N186 End stage renal disease: Secondary | ICD-10-CM | POA: Diagnosis not present

## 2022-06-14 DIAGNOSIS — N2581 Secondary hyperparathyroidism of renal origin: Secondary | ICD-10-CM | POA: Diagnosis not present

## 2022-06-14 DIAGNOSIS — N186 End stage renal disease: Secondary | ICD-10-CM | POA: Diagnosis not present

## 2022-06-14 DIAGNOSIS — Z992 Dependence on renal dialysis: Secondary | ICD-10-CM | POA: Diagnosis not present

## 2022-06-14 DIAGNOSIS — D631 Anemia in chronic kidney disease: Secondary | ICD-10-CM | POA: Diagnosis not present

## 2022-06-14 LAB — CUP PACEART REMOTE DEVICE CHECK
Date Time Interrogation Session: 20230615000317
Implantable Pulse Generator Implant Date: 20230201

## 2022-06-16 DIAGNOSIS — D631 Anemia in chronic kidney disease: Secondary | ICD-10-CM | POA: Diagnosis not present

## 2022-06-16 DIAGNOSIS — N186 End stage renal disease: Secondary | ICD-10-CM | POA: Diagnosis not present

## 2022-06-16 DIAGNOSIS — N2581 Secondary hyperparathyroidism of renal origin: Secondary | ICD-10-CM | POA: Diagnosis not present

## 2022-06-16 DIAGNOSIS — Z992 Dependence on renal dialysis: Secondary | ICD-10-CM | POA: Diagnosis not present

## 2022-06-19 DIAGNOSIS — D631 Anemia in chronic kidney disease: Secondary | ICD-10-CM | POA: Diagnosis not present

## 2022-06-19 DIAGNOSIS — N186 End stage renal disease: Secondary | ICD-10-CM | POA: Diagnosis not present

## 2022-06-19 DIAGNOSIS — Z992 Dependence on renal dialysis: Secondary | ICD-10-CM | POA: Diagnosis not present

## 2022-06-19 DIAGNOSIS — N2581 Secondary hyperparathyroidism of renal origin: Secondary | ICD-10-CM | POA: Diagnosis not present

## 2022-06-21 DIAGNOSIS — D631 Anemia in chronic kidney disease: Secondary | ICD-10-CM | POA: Diagnosis not present

## 2022-06-21 DIAGNOSIS — N2581 Secondary hyperparathyroidism of renal origin: Secondary | ICD-10-CM | POA: Diagnosis not present

## 2022-06-21 DIAGNOSIS — Z992 Dependence on renal dialysis: Secondary | ICD-10-CM | POA: Diagnosis not present

## 2022-06-21 DIAGNOSIS — N186 End stage renal disease: Secondary | ICD-10-CM | POA: Diagnosis not present

## 2022-06-23 DIAGNOSIS — N186 End stage renal disease: Secondary | ICD-10-CM | POA: Diagnosis not present

## 2022-06-23 DIAGNOSIS — D631 Anemia in chronic kidney disease: Secondary | ICD-10-CM | POA: Diagnosis not present

## 2022-06-23 DIAGNOSIS — N2581 Secondary hyperparathyroidism of renal origin: Secondary | ICD-10-CM | POA: Diagnosis not present

## 2022-06-23 DIAGNOSIS — Z992 Dependence on renal dialysis: Secondary | ICD-10-CM | POA: Diagnosis not present

## 2022-06-23 DIAGNOSIS — I129 Hypertensive chronic kidney disease with stage 1 through stage 4 chronic kidney disease, or unspecified chronic kidney disease: Secondary | ICD-10-CM | POA: Diagnosis not present

## 2022-06-26 DIAGNOSIS — N186 End stage renal disease: Secondary | ICD-10-CM | POA: Diagnosis not present

## 2022-06-26 DIAGNOSIS — E1122 Type 2 diabetes mellitus with diabetic chronic kidney disease: Secondary | ICD-10-CM | POA: Diagnosis not present

## 2022-06-26 DIAGNOSIS — Z992 Dependence on renal dialysis: Secondary | ICD-10-CM | POA: Diagnosis not present

## 2022-06-26 DIAGNOSIS — N2581 Secondary hyperparathyroidism of renal origin: Secondary | ICD-10-CM | POA: Diagnosis not present

## 2022-06-26 DIAGNOSIS — D631 Anemia in chronic kidney disease: Secondary | ICD-10-CM | POA: Diagnosis not present

## 2022-06-28 DIAGNOSIS — E1122 Type 2 diabetes mellitus with diabetic chronic kidney disease: Secondary | ICD-10-CM | POA: Diagnosis not present

## 2022-06-28 DIAGNOSIS — D631 Anemia in chronic kidney disease: Secondary | ICD-10-CM | POA: Diagnosis not present

## 2022-06-28 DIAGNOSIS — Z992 Dependence on renal dialysis: Secondary | ICD-10-CM | POA: Diagnosis not present

## 2022-06-28 DIAGNOSIS — N186 End stage renal disease: Secondary | ICD-10-CM | POA: Diagnosis not present

## 2022-06-28 DIAGNOSIS — N2581 Secondary hyperparathyroidism of renal origin: Secondary | ICD-10-CM | POA: Diagnosis not present

## 2022-06-29 DIAGNOSIS — G4700 Insomnia, unspecified: Secondary | ICD-10-CM | POA: Diagnosis not present

## 2022-06-29 DIAGNOSIS — Z0000 Encounter for general adult medical examination without abnormal findings: Secondary | ICD-10-CM | POA: Diagnosis not present

## 2022-06-29 DIAGNOSIS — M8580 Other specified disorders of bone density and structure, unspecified site: Secondary | ICD-10-CM | POA: Diagnosis not present

## 2022-06-29 DIAGNOSIS — N185 Chronic kidney disease, stage 5: Secondary | ICD-10-CM | POA: Diagnosis not present

## 2022-06-29 DIAGNOSIS — E441 Mild protein-calorie malnutrition: Secondary | ICD-10-CM | POA: Diagnosis not present

## 2022-06-29 DIAGNOSIS — E1122 Type 2 diabetes mellitus with diabetic chronic kidney disease: Secondary | ICD-10-CM | POA: Diagnosis not present

## 2022-06-29 DIAGNOSIS — I10 Essential (primary) hypertension: Secondary | ICD-10-CM | POA: Diagnosis not present

## 2022-06-29 DIAGNOSIS — E782 Mixed hyperlipidemia: Secondary | ICD-10-CM | POA: Diagnosis not present

## 2022-06-29 DIAGNOSIS — G2581 Restless legs syndrome: Secondary | ICD-10-CM | POA: Diagnosis not present

## 2022-06-29 DIAGNOSIS — E1142 Type 2 diabetes mellitus with diabetic polyneuropathy: Secondary | ICD-10-CM | POA: Diagnosis not present

## 2022-06-29 DIAGNOSIS — E0590 Thyrotoxicosis, unspecified without thyrotoxic crisis or storm: Secondary | ICD-10-CM | POA: Diagnosis not present

## 2022-06-30 DIAGNOSIS — N2581 Secondary hyperparathyroidism of renal origin: Secondary | ICD-10-CM | POA: Diagnosis not present

## 2022-06-30 DIAGNOSIS — N186 End stage renal disease: Secondary | ICD-10-CM | POA: Diagnosis not present

## 2022-06-30 DIAGNOSIS — Z992 Dependence on renal dialysis: Secondary | ICD-10-CM | POA: Diagnosis not present

## 2022-06-30 DIAGNOSIS — D631 Anemia in chronic kidney disease: Secondary | ICD-10-CM | POA: Diagnosis not present

## 2022-06-30 DIAGNOSIS — E1122 Type 2 diabetes mellitus with diabetic chronic kidney disease: Secondary | ICD-10-CM | POA: Diagnosis not present

## 2022-06-30 NOTE — Progress Notes (Signed)
[facility] Summary Report / Loop Recorder 

## 2022-07-03 DIAGNOSIS — N186 End stage renal disease: Secondary | ICD-10-CM | POA: Diagnosis not present

## 2022-07-03 DIAGNOSIS — N2581 Secondary hyperparathyroidism of renal origin: Secondary | ICD-10-CM | POA: Diagnosis not present

## 2022-07-03 DIAGNOSIS — D631 Anemia in chronic kidney disease: Secondary | ICD-10-CM | POA: Diagnosis not present

## 2022-07-03 DIAGNOSIS — Z992 Dependence on renal dialysis: Secondary | ICD-10-CM | POA: Diagnosis not present

## 2022-07-03 DIAGNOSIS — E1122 Type 2 diabetes mellitus with diabetic chronic kidney disease: Secondary | ICD-10-CM | POA: Diagnosis not present

## 2022-07-05 DIAGNOSIS — E1122 Type 2 diabetes mellitus with diabetic chronic kidney disease: Secondary | ICD-10-CM | POA: Diagnosis not present

## 2022-07-05 DIAGNOSIS — N2581 Secondary hyperparathyroidism of renal origin: Secondary | ICD-10-CM | POA: Diagnosis not present

## 2022-07-05 DIAGNOSIS — N186 End stage renal disease: Secondary | ICD-10-CM | POA: Diagnosis not present

## 2022-07-05 DIAGNOSIS — Z992 Dependence on renal dialysis: Secondary | ICD-10-CM | POA: Diagnosis not present

## 2022-07-05 DIAGNOSIS — D631 Anemia in chronic kidney disease: Secondary | ICD-10-CM | POA: Diagnosis not present

## 2022-07-07 DIAGNOSIS — N2581 Secondary hyperparathyroidism of renal origin: Secondary | ICD-10-CM | POA: Diagnosis not present

## 2022-07-07 DIAGNOSIS — D631 Anemia in chronic kidney disease: Secondary | ICD-10-CM | POA: Diagnosis not present

## 2022-07-07 DIAGNOSIS — E1122 Type 2 diabetes mellitus with diabetic chronic kidney disease: Secondary | ICD-10-CM | POA: Diagnosis not present

## 2022-07-07 DIAGNOSIS — N186 End stage renal disease: Secondary | ICD-10-CM | POA: Diagnosis not present

## 2022-07-07 DIAGNOSIS — Z992 Dependence on renal dialysis: Secondary | ICD-10-CM | POA: Diagnosis not present

## 2022-07-10 DIAGNOSIS — D631 Anemia in chronic kidney disease: Secondary | ICD-10-CM | POA: Diagnosis not present

## 2022-07-10 DIAGNOSIS — N2581 Secondary hyperparathyroidism of renal origin: Secondary | ICD-10-CM | POA: Diagnosis not present

## 2022-07-10 DIAGNOSIS — Z992 Dependence on renal dialysis: Secondary | ICD-10-CM | POA: Diagnosis not present

## 2022-07-10 DIAGNOSIS — N186 End stage renal disease: Secondary | ICD-10-CM | POA: Diagnosis not present

## 2022-07-10 DIAGNOSIS — E1122 Type 2 diabetes mellitus with diabetic chronic kidney disease: Secondary | ICD-10-CM | POA: Diagnosis not present

## 2022-07-12 DIAGNOSIS — D631 Anemia in chronic kidney disease: Secondary | ICD-10-CM | POA: Diagnosis not present

## 2022-07-12 DIAGNOSIS — N186 End stage renal disease: Secondary | ICD-10-CM | POA: Diagnosis not present

## 2022-07-12 DIAGNOSIS — N2581 Secondary hyperparathyroidism of renal origin: Secondary | ICD-10-CM | POA: Diagnosis not present

## 2022-07-12 DIAGNOSIS — Z992 Dependence on renal dialysis: Secondary | ICD-10-CM | POA: Diagnosis not present

## 2022-07-12 DIAGNOSIS — E1122 Type 2 diabetes mellitus with diabetic chronic kidney disease: Secondary | ICD-10-CM | POA: Diagnosis not present

## 2022-07-14 DIAGNOSIS — Z992 Dependence on renal dialysis: Secondary | ICD-10-CM | POA: Diagnosis not present

## 2022-07-14 DIAGNOSIS — N2581 Secondary hyperparathyroidism of renal origin: Secondary | ICD-10-CM | POA: Diagnosis not present

## 2022-07-14 DIAGNOSIS — N186 End stage renal disease: Secondary | ICD-10-CM | POA: Diagnosis not present

## 2022-07-14 DIAGNOSIS — D631 Anemia in chronic kidney disease: Secondary | ICD-10-CM | POA: Diagnosis not present

## 2022-07-14 DIAGNOSIS — E1122 Type 2 diabetes mellitus with diabetic chronic kidney disease: Secondary | ICD-10-CM | POA: Diagnosis not present

## 2022-07-17 ENCOUNTER — Ambulatory Visit (INDEPENDENT_AMBULATORY_CARE_PROVIDER_SITE_OTHER): Payer: Medicare Other

## 2022-07-17 DIAGNOSIS — D631 Anemia in chronic kidney disease: Secondary | ICD-10-CM | POA: Diagnosis not present

## 2022-07-17 DIAGNOSIS — N186 End stage renal disease: Secondary | ICD-10-CM | POA: Diagnosis not present

## 2022-07-17 DIAGNOSIS — I639 Cerebral infarction, unspecified: Secondary | ICD-10-CM

## 2022-07-17 DIAGNOSIS — Z992 Dependence on renal dialysis: Secondary | ICD-10-CM | POA: Diagnosis not present

## 2022-07-17 DIAGNOSIS — N2581 Secondary hyperparathyroidism of renal origin: Secondary | ICD-10-CM | POA: Diagnosis not present

## 2022-07-17 DIAGNOSIS — E1122 Type 2 diabetes mellitus with diabetic chronic kidney disease: Secondary | ICD-10-CM | POA: Diagnosis not present

## 2022-07-17 LAB — CUP PACEART REMOTE DEVICE CHECK
Date Time Interrogation Session: 20230718000508
Implantable Pulse Generator Implant Date: 20230201

## 2022-07-19 DIAGNOSIS — D631 Anemia in chronic kidney disease: Secondary | ICD-10-CM | POA: Diagnosis not present

## 2022-07-19 DIAGNOSIS — N2581 Secondary hyperparathyroidism of renal origin: Secondary | ICD-10-CM | POA: Diagnosis not present

## 2022-07-19 DIAGNOSIS — E1122 Type 2 diabetes mellitus with diabetic chronic kidney disease: Secondary | ICD-10-CM | POA: Diagnosis not present

## 2022-07-19 DIAGNOSIS — Z992 Dependence on renal dialysis: Secondary | ICD-10-CM | POA: Diagnosis not present

## 2022-07-19 DIAGNOSIS — N186 End stage renal disease: Secondary | ICD-10-CM | POA: Diagnosis not present

## 2022-07-21 DIAGNOSIS — Z992 Dependence on renal dialysis: Secondary | ICD-10-CM | POA: Diagnosis not present

## 2022-07-21 DIAGNOSIS — D631 Anemia in chronic kidney disease: Secondary | ICD-10-CM | POA: Diagnosis not present

## 2022-07-21 DIAGNOSIS — N186 End stage renal disease: Secondary | ICD-10-CM | POA: Diagnosis not present

## 2022-07-21 DIAGNOSIS — N2581 Secondary hyperparathyroidism of renal origin: Secondary | ICD-10-CM | POA: Diagnosis not present

## 2022-07-21 DIAGNOSIS — E1122 Type 2 diabetes mellitus with diabetic chronic kidney disease: Secondary | ICD-10-CM | POA: Diagnosis not present

## 2022-07-24 DIAGNOSIS — Z992 Dependence on renal dialysis: Secondary | ICD-10-CM | POA: Diagnosis not present

## 2022-07-24 DIAGNOSIS — N186 End stage renal disease: Secondary | ICD-10-CM | POA: Diagnosis not present

## 2022-07-24 DIAGNOSIS — N2581 Secondary hyperparathyroidism of renal origin: Secondary | ICD-10-CM | POA: Diagnosis not present

## 2022-07-24 DIAGNOSIS — D631 Anemia in chronic kidney disease: Secondary | ICD-10-CM | POA: Diagnosis not present

## 2022-07-24 DIAGNOSIS — E1122 Type 2 diabetes mellitus with diabetic chronic kidney disease: Secondary | ICD-10-CM | POA: Diagnosis not present

## 2022-07-24 DIAGNOSIS — I129 Hypertensive chronic kidney disease with stage 1 through stage 4 chronic kidney disease, or unspecified chronic kidney disease: Secondary | ICD-10-CM | POA: Diagnosis not present

## 2022-07-26 DIAGNOSIS — Z992 Dependence on renal dialysis: Secondary | ICD-10-CM | POA: Diagnosis not present

## 2022-07-26 DIAGNOSIS — N2581 Secondary hyperparathyroidism of renal origin: Secondary | ICD-10-CM | POA: Diagnosis not present

## 2022-07-26 DIAGNOSIS — N186 End stage renal disease: Secondary | ICD-10-CM | POA: Diagnosis not present

## 2022-07-26 DIAGNOSIS — D631 Anemia in chronic kidney disease: Secondary | ICD-10-CM | POA: Diagnosis not present

## 2022-07-27 DIAGNOSIS — E0590 Thyrotoxicosis, unspecified without thyrotoxic crisis or storm: Secondary | ICD-10-CM | POA: Diagnosis not present

## 2022-07-28 DIAGNOSIS — N2581 Secondary hyperparathyroidism of renal origin: Secondary | ICD-10-CM | POA: Diagnosis not present

## 2022-07-28 DIAGNOSIS — Z992 Dependence on renal dialysis: Secondary | ICD-10-CM | POA: Diagnosis not present

## 2022-07-28 DIAGNOSIS — D631 Anemia in chronic kidney disease: Secondary | ICD-10-CM | POA: Diagnosis not present

## 2022-07-28 DIAGNOSIS — N186 End stage renal disease: Secondary | ICD-10-CM | POA: Diagnosis not present

## 2022-07-31 DIAGNOSIS — N186 End stage renal disease: Secondary | ICD-10-CM | POA: Diagnosis not present

## 2022-07-31 DIAGNOSIS — Z992 Dependence on renal dialysis: Secondary | ICD-10-CM | POA: Diagnosis not present

## 2022-07-31 DIAGNOSIS — N2581 Secondary hyperparathyroidism of renal origin: Secondary | ICD-10-CM | POA: Diagnosis not present

## 2022-07-31 DIAGNOSIS — D631 Anemia in chronic kidney disease: Secondary | ICD-10-CM | POA: Diagnosis not present

## 2022-08-02 DIAGNOSIS — N2581 Secondary hyperparathyroidism of renal origin: Secondary | ICD-10-CM | POA: Diagnosis not present

## 2022-08-02 DIAGNOSIS — N186 End stage renal disease: Secondary | ICD-10-CM | POA: Diagnosis not present

## 2022-08-02 DIAGNOSIS — D631 Anemia in chronic kidney disease: Secondary | ICD-10-CM | POA: Diagnosis not present

## 2022-08-02 DIAGNOSIS — Z992 Dependence on renal dialysis: Secondary | ICD-10-CM | POA: Diagnosis not present

## 2022-08-04 DIAGNOSIS — Z992 Dependence on renal dialysis: Secondary | ICD-10-CM | POA: Diagnosis not present

## 2022-08-04 DIAGNOSIS — D631 Anemia in chronic kidney disease: Secondary | ICD-10-CM | POA: Diagnosis not present

## 2022-08-04 DIAGNOSIS — N186 End stage renal disease: Secondary | ICD-10-CM | POA: Diagnosis not present

## 2022-08-04 DIAGNOSIS — N2581 Secondary hyperparathyroidism of renal origin: Secondary | ICD-10-CM | POA: Diagnosis not present

## 2022-08-07 DIAGNOSIS — Z992 Dependence on renal dialysis: Secondary | ICD-10-CM | POA: Diagnosis not present

## 2022-08-07 DIAGNOSIS — N2581 Secondary hyperparathyroidism of renal origin: Secondary | ICD-10-CM | POA: Diagnosis not present

## 2022-08-07 DIAGNOSIS — N186 End stage renal disease: Secondary | ICD-10-CM | POA: Diagnosis not present

## 2022-08-07 DIAGNOSIS — D631 Anemia in chronic kidney disease: Secondary | ICD-10-CM | POA: Diagnosis not present

## 2022-08-09 DIAGNOSIS — D631 Anemia in chronic kidney disease: Secondary | ICD-10-CM | POA: Diagnosis not present

## 2022-08-09 DIAGNOSIS — N186 End stage renal disease: Secondary | ICD-10-CM | POA: Diagnosis not present

## 2022-08-09 DIAGNOSIS — N2581 Secondary hyperparathyroidism of renal origin: Secondary | ICD-10-CM | POA: Diagnosis not present

## 2022-08-09 DIAGNOSIS — Z992 Dependence on renal dialysis: Secondary | ICD-10-CM | POA: Diagnosis not present

## 2022-08-11 DIAGNOSIS — N2581 Secondary hyperparathyroidism of renal origin: Secondary | ICD-10-CM | POA: Diagnosis not present

## 2022-08-11 DIAGNOSIS — Z992 Dependence on renal dialysis: Secondary | ICD-10-CM | POA: Diagnosis not present

## 2022-08-11 DIAGNOSIS — D631 Anemia in chronic kidney disease: Secondary | ICD-10-CM | POA: Diagnosis not present

## 2022-08-11 DIAGNOSIS — N186 End stage renal disease: Secondary | ICD-10-CM | POA: Diagnosis not present

## 2022-08-14 DIAGNOSIS — Z992 Dependence on renal dialysis: Secondary | ICD-10-CM | POA: Diagnosis not present

## 2022-08-14 DIAGNOSIS — N2581 Secondary hyperparathyroidism of renal origin: Secondary | ICD-10-CM | POA: Diagnosis not present

## 2022-08-14 DIAGNOSIS — N186 End stage renal disease: Secondary | ICD-10-CM | POA: Diagnosis not present

## 2022-08-14 DIAGNOSIS — D631 Anemia in chronic kidney disease: Secondary | ICD-10-CM | POA: Diagnosis not present

## 2022-08-16 DIAGNOSIS — D631 Anemia in chronic kidney disease: Secondary | ICD-10-CM | POA: Diagnosis not present

## 2022-08-16 DIAGNOSIS — Z992 Dependence on renal dialysis: Secondary | ICD-10-CM | POA: Diagnosis not present

## 2022-08-16 DIAGNOSIS — N2581 Secondary hyperparathyroidism of renal origin: Secondary | ICD-10-CM | POA: Diagnosis not present

## 2022-08-16 DIAGNOSIS — N186 End stage renal disease: Secondary | ICD-10-CM | POA: Diagnosis not present

## 2022-08-18 DIAGNOSIS — N186 End stage renal disease: Secondary | ICD-10-CM | POA: Diagnosis not present

## 2022-08-18 DIAGNOSIS — Z992 Dependence on renal dialysis: Secondary | ICD-10-CM | POA: Diagnosis not present

## 2022-08-18 DIAGNOSIS — N2581 Secondary hyperparathyroidism of renal origin: Secondary | ICD-10-CM | POA: Diagnosis not present

## 2022-08-18 DIAGNOSIS — D631 Anemia in chronic kidney disease: Secondary | ICD-10-CM | POA: Diagnosis not present

## 2022-08-21 ENCOUNTER — Ambulatory Visit (INDEPENDENT_AMBULATORY_CARE_PROVIDER_SITE_OTHER): Payer: Medicare Other

## 2022-08-21 DIAGNOSIS — Z992 Dependence on renal dialysis: Secondary | ICD-10-CM | POA: Diagnosis not present

## 2022-08-21 DIAGNOSIS — I639 Cerebral infarction, unspecified: Secondary | ICD-10-CM

## 2022-08-21 DIAGNOSIS — N186 End stage renal disease: Secondary | ICD-10-CM | POA: Diagnosis not present

## 2022-08-21 DIAGNOSIS — D631 Anemia in chronic kidney disease: Secondary | ICD-10-CM | POA: Diagnosis not present

## 2022-08-21 DIAGNOSIS — N2581 Secondary hyperparathyroidism of renal origin: Secondary | ICD-10-CM | POA: Diagnosis not present

## 2022-08-21 LAB — CUP PACEART REMOTE DEVICE CHECK
Date Time Interrogation Session: 20230820000431
Implantable Pulse Generator Implant Date: 20230201

## 2022-08-21 NOTE — Progress Notes (Signed)
[facility] Summary Report / Loop Recorder 

## 2022-08-24 ENCOUNTER — Ambulatory Visit: Payer: Self-pay

## 2022-08-24 DIAGNOSIS — N186 End stage renal disease: Secondary | ICD-10-CM | POA: Diagnosis not present

## 2022-08-24 DIAGNOSIS — I129 Hypertensive chronic kidney disease with stage 1 through stage 4 chronic kidney disease, or unspecified chronic kidney disease: Secondary | ICD-10-CM | POA: Diagnosis not present

## 2022-08-24 DIAGNOSIS — Z992 Dependence on renal dialysis: Secondary | ICD-10-CM | POA: Diagnosis not present

## 2022-08-24 DIAGNOSIS — T82898A Other specified complication of vascular prosthetic devices, implants and grafts, initial encounter: Secondary | ICD-10-CM | POA: Diagnosis not present

## 2022-08-24 NOTE — Patient Outreach (Signed)
  Care Coordination   08/24/2022 Name: ZARYIA MARKEL MRN: [id number] DOB: November 16, 1941   Care Coordination Outreach Attempts:  An unsuccessful telephone outreach was attempted today to offer the patient information about available care coordination services as a benefit of their health plan.   Follow Up Plan:  Additional outreach attempts will be made to offer the patient care coordination information and services.    Encounter Outcome:  No Answer  Care Coordination Interventions Activated:  No   Care Coordination Interventions:  No, not indicated    [facility] [phone]

## 2022-08-25 DIAGNOSIS — N186 End stage renal disease: Secondary | ICD-10-CM | POA: Diagnosis not present

## 2022-08-25 DIAGNOSIS — N2581 Secondary hyperparathyroidism of renal origin: Secondary | ICD-10-CM | POA: Diagnosis not present

## 2022-08-25 DIAGNOSIS — Z992 Dependence on renal dialysis: Secondary | ICD-10-CM | POA: Diagnosis not present

## 2022-08-25 DIAGNOSIS — D631 Anemia in chronic kidney disease: Secondary | ICD-10-CM | POA: Diagnosis not present

## 2022-08-28 DIAGNOSIS — Z992 Dependence on renal dialysis: Secondary | ICD-10-CM | POA: Diagnosis not present

## 2022-08-28 DIAGNOSIS — N2581 Secondary hyperparathyroidism of renal origin: Secondary | ICD-10-CM | POA: Diagnosis not present

## 2022-08-28 DIAGNOSIS — D631 Anemia in chronic kidney disease: Secondary | ICD-10-CM | POA: Diagnosis not present

## 2022-08-28 DIAGNOSIS — N186 End stage renal disease: Secondary | ICD-10-CM | POA: Diagnosis not present

## 2022-08-30 DIAGNOSIS — Z992 Dependence on renal dialysis: Secondary | ICD-10-CM | POA: Diagnosis not present

## 2022-08-30 DIAGNOSIS — D631 Anemia in chronic kidney disease: Secondary | ICD-10-CM | POA: Diagnosis not present

## 2022-08-30 DIAGNOSIS — N186 End stage renal disease: Secondary | ICD-10-CM | POA: Diagnosis not present

## 2022-08-30 DIAGNOSIS — N2581 Secondary hyperparathyroidism of renal origin: Secondary | ICD-10-CM | POA: Diagnosis not present

## 2022-09-01 DIAGNOSIS — N2581 Secondary hyperparathyroidism of renal origin: Secondary | ICD-10-CM | POA: Diagnosis not present

## 2022-09-01 DIAGNOSIS — D631 Anemia in chronic kidney disease: Secondary | ICD-10-CM | POA: Diagnosis not present

## 2022-09-01 DIAGNOSIS — Z992 Dependence on renal dialysis: Secondary | ICD-10-CM | POA: Diagnosis not present

## 2022-09-01 DIAGNOSIS — N186 End stage renal disease: Secondary | ICD-10-CM | POA: Diagnosis not present

## 2022-09-04 DIAGNOSIS — D631 Anemia in chronic kidney disease: Secondary | ICD-10-CM | POA: Diagnosis not present

## 2022-09-04 DIAGNOSIS — N186 End stage renal disease: Secondary | ICD-10-CM | POA: Diagnosis not present

## 2022-09-04 DIAGNOSIS — N2581 Secondary hyperparathyroidism of renal origin: Secondary | ICD-10-CM | POA: Diagnosis not present

## 2022-09-04 DIAGNOSIS — Z992 Dependence on renal dialysis: Secondary | ICD-10-CM | POA: Diagnosis not present

## 2022-09-05 DIAGNOSIS — N184 Chronic kidney disease, stage 4 (severe): Secondary | ICD-10-CM | POA: Diagnosis not present

## 2022-09-05 DIAGNOSIS — M8580 Other specified disorders of bone density and structure, unspecified site: Secondary | ICD-10-CM | POA: Diagnosis not present

## 2022-09-05 DIAGNOSIS — K219 Gastro-esophageal reflux disease without esophagitis: Secondary | ICD-10-CM | POA: Diagnosis not present

## 2022-09-05 DIAGNOSIS — G4700 Insomnia, unspecified: Secondary | ICD-10-CM | POA: Diagnosis not present

## 2022-09-05 DIAGNOSIS — I10 Essential (primary) hypertension: Secondary | ICD-10-CM | POA: Diagnosis not present

## 2022-09-05 DIAGNOSIS — E782 Mixed hyperlipidemia: Secondary | ICD-10-CM | POA: Diagnosis not present

## 2022-09-05 DIAGNOSIS — E1122 Type 2 diabetes mellitus with diabetic chronic kidney disease: Secondary | ICD-10-CM | POA: Diagnosis not present

## 2022-09-06 DIAGNOSIS — N2581 Secondary hyperparathyroidism of renal origin: Secondary | ICD-10-CM | POA: Diagnosis not present

## 2022-09-06 DIAGNOSIS — D631 Anemia in chronic kidney disease: Secondary | ICD-10-CM | POA: Diagnosis not present

## 2022-09-06 DIAGNOSIS — N186 End stage renal disease: Secondary | ICD-10-CM | POA: Diagnosis not present

## 2022-09-06 DIAGNOSIS — Z992 Dependence on renal dialysis: Secondary | ICD-10-CM | POA: Diagnosis not present

## 2022-09-07 DIAGNOSIS — E854 Organ-limited amyloidosis: Secondary | ICD-10-CM | POA: Diagnosis not present

## 2022-09-07 DIAGNOSIS — Z8673 Personal history of transient ischemic attack (TIA), and cerebral infarction without residual deficits: Secondary | ICD-10-CM | POA: Diagnosis not present

## 2022-09-07 DIAGNOSIS — Z992 Dependence on renal dialysis: Secondary | ICD-10-CM | POA: Diagnosis not present

## 2022-09-07 DIAGNOSIS — R0683 Snoring: Secondary | ICD-10-CM | POA: Diagnosis not present

## 2022-09-07 DIAGNOSIS — M47812 Spondylosis without myelopathy or radiculopathy, cervical region: Secondary | ICD-10-CM | POA: Diagnosis not present

## 2022-09-07 DIAGNOSIS — I680 Cerebral amyloid angiopathy: Secondary | ICD-10-CM | POA: Diagnosis not present

## 2022-09-07 DIAGNOSIS — N186 End stage renal disease: Secondary | ICD-10-CM | POA: Diagnosis not present

## 2022-09-08 DIAGNOSIS — D631 Anemia in chronic kidney disease: Secondary | ICD-10-CM | POA: Diagnosis not present

## 2022-09-08 DIAGNOSIS — N186 End stage renal disease: Secondary | ICD-10-CM | POA: Diagnosis not present

## 2022-09-08 DIAGNOSIS — Z992 Dependence on renal dialysis: Secondary | ICD-10-CM | POA: Diagnosis not present

## 2022-09-08 DIAGNOSIS — N2581 Secondary hyperparathyroidism of renal origin: Secondary | ICD-10-CM | POA: Diagnosis not present

## 2022-09-11 DIAGNOSIS — N2581 Secondary hyperparathyroidism of renal origin: Secondary | ICD-10-CM | POA: Diagnosis not present

## 2022-09-11 DIAGNOSIS — D631 Anemia in chronic kidney disease: Secondary | ICD-10-CM | POA: Diagnosis not present

## 2022-09-11 DIAGNOSIS — N186 End stage renal disease: Secondary | ICD-10-CM | POA: Diagnosis not present

## 2022-09-11 DIAGNOSIS — Z992 Dependence on renal dialysis: Secondary | ICD-10-CM | POA: Diagnosis not present

## 2022-09-13 ENCOUNTER — Telehealth: Payer: Self-pay

## 2022-09-13 DIAGNOSIS — N186 End stage renal disease: Secondary | ICD-10-CM | POA: Diagnosis not present

## 2022-09-13 DIAGNOSIS — D631 Anemia in chronic kidney disease: Secondary | ICD-10-CM | POA: Diagnosis not present

## 2022-09-13 DIAGNOSIS — Z992 Dependence on renal dialysis: Secondary | ICD-10-CM | POA: Diagnosis not present

## 2022-09-13 DIAGNOSIS — N2581 Secondary hyperparathyroidism of renal origin: Secondary | ICD-10-CM | POA: Diagnosis not present

## 2022-09-13 NOTE — Patient Outreach (Signed)
  Care Coordination   09/13/2022 Name: Monica Hunt MRN: [id number] DOB: 10/05/1941   Care Coordination Outreach Attempts:  A second unsuccessful outreach was attempted today to offer the patient with information about available care coordination services as a benefit of their health plan.     Follow Up Plan:  Additional outreach attempts will be made to offer the patient care coordination information and services.   Encounter Outcome:  No Answer  Care Coordination Interventions Activated:  No   Care Coordination Interventions:  No, not indicated    [facility] [phone]

## 2022-09-14 NOTE — Progress Notes (Signed)
[facility] Summary Report / Loop Recorder 

## 2022-09-15 ENCOUNTER — Ambulatory Visit (INDEPENDENT_AMBULATORY_CARE_PROVIDER_SITE_OTHER): Payer: Medicare Other

## 2022-09-15 DIAGNOSIS — R55 Syncope and collapse: Secondary | ICD-10-CM | POA: Diagnosis not present

## 2022-09-15 DIAGNOSIS — N2581 Secondary hyperparathyroidism of renal origin: Secondary | ICD-10-CM | POA: Diagnosis not present

## 2022-09-15 DIAGNOSIS — D631 Anemia in chronic kidney disease: Secondary | ICD-10-CM | POA: Diagnosis not present

## 2022-09-15 DIAGNOSIS — Z992 Dependence on renal dialysis: Secondary | ICD-10-CM | POA: Diagnosis not present

## 2022-09-15 DIAGNOSIS — N186 End stage renal disease: Secondary | ICD-10-CM | POA: Diagnosis not present

## 2022-09-18 DIAGNOSIS — D631 Anemia in chronic kidney disease: Secondary | ICD-10-CM | POA: Diagnosis not present

## 2022-09-18 DIAGNOSIS — N186 End stage renal disease: Secondary | ICD-10-CM | POA: Diagnosis not present

## 2022-09-18 DIAGNOSIS — N2581 Secondary hyperparathyroidism of renal origin: Secondary | ICD-10-CM | POA: Diagnosis not present

## 2022-09-18 DIAGNOSIS — Z992 Dependence on renal dialysis: Secondary | ICD-10-CM | POA: Diagnosis not present

## 2022-09-18 LAB — CUP PACEART REMOTE DEVICE CHECK
Date Time Interrogation Session: 20230922000534
Implantable Pulse Generator Implant Date: 20230201

## 2022-09-19 DIAGNOSIS — R42 Dizziness and giddiness: Secondary | ICD-10-CM | POA: Diagnosis not present

## 2022-09-19 DIAGNOSIS — Z23 Encounter for immunization: Secondary | ICD-10-CM | POA: Diagnosis not present

## 2022-09-19 DIAGNOSIS — I959 Hypotension, unspecified: Secondary | ICD-10-CM | POA: Diagnosis not present

## 2022-09-19 DIAGNOSIS — E0590 Thyrotoxicosis, unspecified without thyrotoxic crisis or storm: Secondary | ICD-10-CM | POA: Diagnosis not present

## 2022-09-20 DIAGNOSIS — D631 Anemia in chronic kidney disease: Secondary | ICD-10-CM | POA: Diagnosis not present

## 2022-09-20 DIAGNOSIS — N2581 Secondary hyperparathyroidism of renal origin: Secondary | ICD-10-CM | POA: Diagnosis not present

## 2022-09-20 DIAGNOSIS — Z992 Dependence on renal dialysis: Secondary | ICD-10-CM | POA: Diagnosis not present

## 2022-09-20 DIAGNOSIS — N186 End stage renal disease: Secondary | ICD-10-CM | POA: Diagnosis not present

## 2022-09-22 DIAGNOSIS — Z992 Dependence on renal dialysis: Secondary | ICD-10-CM | POA: Diagnosis not present

## 2022-09-22 DIAGNOSIS — D631 Anemia in chronic kidney disease: Secondary | ICD-10-CM | POA: Diagnosis not present

## 2022-09-22 DIAGNOSIS — N2581 Secondary hyperparathyroidism of renal origin: Secondary | ICD-10-CM | POA: Diagnosis not present

## 2022-09-22 DIAGNOSIS — N186 End stage renal disease: Secondary | ICD-10-CM | POA: Diagnosis not present

## 2022-09-22 NOTE — Progress Notes (Signed)
[facility] Summary Report / Loop Recorder 

## 2022-09-23 DIAGNOSIS — Z992 Dependence on renal dialysis: Secondary | ICD-10-CM | POA: Diagnosis not present

## 2022-09-23 DIAGNOSIS — I129 Hypertensive chronic kidney disease with stage 1 through stage 4 chronic kidney disease, or unspecified chronic kidney disease: Secondary | ICD-10-CM | POA: Diagnosis not present

## 2022-09-23 DIAGNOSIS — N186 End stage renal disease: Secondary | ICD-10-CM | POA: Diagnosis not present

## 2022-09-25 DIAGNOSIS — N186 End stage renal disease: Secondary | ICD-10-CM | POA: Diagnosis not present

## 2022-09-25 DIAGNOSIS — N2581 Secondary hyperparathyroidism of renal origin: Secondary | ICD-10-CM | POA: Diagnosis not present

## 2022-09-25 DIAGNOSIS — D631 Anemia in chronic kidney disease: Secondary | ICD-10-CM | POA: Diagnosis not present

## 2022-09-25 DIAGNOSIS — E1122 Type 2 diabetes mellitus with diabetic chronic kidney disease: Secondary | ICD-10-CM | POA: Diagnosis not present

## 2022-09-25 DIAGNOSIS — Z992 Dependence on renal dialysis: Secondary | ICD-10-CM | POA: Diagnosis not present

## 2022-09-27 DIAGNOSIS — N2581 Secondary hyperparathyroidism of renal origin: Secondary | ICD-10-CM | POA: Diagnosis not present

## 2022-09-27 DIAGNOSIS — Z992 Dependence on renal dialysis: Secondary | ICD-10-CM | POA: Diagnosis not present

## 2022-09-27 DIAGNOSIS — E1122 Type 2 diabetes mellitus with diabetic chronic kidney disease: Secondary | ICD-10-CM | POA: Diagnosis not present

## 2022-09-27 DIAGNOSIS — N186 End stage renal disease: Secondary | ICD-10-CM | POA: Diagnosis not present

## 2022-09-27 DIAGNOSIS — D631 Anemia in chronic kidney disease: Secondary | ICD-10-CM | POA: Diagnosis not present

## 2022-09-29 DIAGNOSIS — E1122 Type 2 diabetes mellitus with diabetic chronic kidney disease: Secondary | ICD-10-CM | POA: Diagnosis not present

## 2022-09-29 DIAGNOSIS — N2581 Secondary hyperparathyroidism of renal origin: Secondary | ICD-10-CM | POA: Diagnosis not present

## 2022-09-29 DIAGNOSIS — N186 End stage renal disease: Secondary | ICD-10-CM | POA: Diagnosis not present

## 2022-09-29 DIAGNOSIS — D631 Anemia in chronic kidney disease: Secondary | ICD-10-CM | POA: Diagnosis not present

## 2022-09-29 DIAGNOSIS — Z992 Dependence on renal dialysis: Secondary | ICD-10-CM | POA: Diagnosis not present

## 2022-10-02 DIAGNOSIS — E1122 Type 2 diabetes mellitus with diabetic chronic kidney disease: Secondary | ICD-10-CM | POA: Diagnosis not present

## 2022-10-02 DIAGNOSIS — N2581 Secondary hyperparathyroidism of renal origin: Secondary | ICD-10-CM | POA: Diagnosis not present

## 2022-10-02 DIAGNOSIS — N186 End stage renal disease: Secondary | ICD-10-CM | POA: Diagnosis not present

## 2022-10-02 DIAGNOSIS — Z992 Dependence on renal dialysis: Secondary | ICD-10-CM | POA: Diagnosis not present

## 2022-10-02 DIAGNOSIS — D631 Anemia in chronic kidney disease: Secondary | ICD-10-CM | POA: Diagnosis not present

## 2022-10-04 DIAGNOSIS — D631 Anemia in chronic kidney disease: Secondary | ICD-10-CM | POA: Diagnosis not present

## 2022-10-04 DIAGNOSIS — N2581 Secondary hyperparathyroidism of renal origin: Secondary | ICD-10-CM | POA: Diagnosis not present

## 2022-10-04 DIAGNOSIS — N186 End stage renal disease: Secondary | ICD-10-CM | POA: Diagnosis not present

## 2022-10-04 DIAGNOSIS — Z992 Dependence on renal dialysis: Secondary | ICD-10-CM | POA: Diagnosis not present

## 2022-10-04 DIAGNOSIS — E1122 Type 2 diabetes mellitus with diabetic chronic kidney disease: Secondary | ICD-10-CM | POA: Diagnosis not present

## 2022-10-06 DIAGNOSIS — N186 End stage renal disease: Secondary | ICD-10-CM | POA: Diagnosis not present

## 2022-10-06 DIAGNOSIS — Z992 Dependence on renal dialysis: Secondary | ICD-10-CM | POA: Diagnosis not present

## 2022-10-06 DIAGNOSIS — D631 Anemia in chronic kidney disease: Secondary | ICD-10-CM | POA: Diagnosis not present

## 2022-10-06 DIAGNOSIS — N2581 Secondary hyperparathyroidism of renal origin: Secondary | ICD-10-CM | POA: Diagnosis not present

## 2022-10-06 DIAGNOSIS — E1122 Type 2 diabetes mellitus with diabetic chronic kidney disease: Secondary | ICD-10-CM | POA: Diagnosis not present

## 2022-10-09 DIAGNOSIS — Z992 Dependence on renal dialysis: Secondary | ICD-10-CM | POA: Diagnosis not present

## 2022-10-09 DIAGNOSIS — N2581 Secondary hyperparathyroidism of renal origin: Secondary | ICD-10-CM | POA: Diagnosis not present

## 2022-10-09 DIAGNOSIS — D631 Anemia in chronic kidney disease: Secondary | ICD-10-CM | POA: Diagnosis not present

## 2022-10-09 DIAGNOSIS — E1122 Type 2 diabetes mellitus with diabetic chronic kidney disease: Secondary | ICD-10-CM | POA: Diagnosis not present

## 2022-10-09 DIAGNOSIS — N186 End stage renal disease: Secondary | ICD-10-CM | POA: Diagnosis not present

## 2022-10-11 DIAGNOSIS — N2581 Secondary hyperparathyroidism of renal origin: Secondary | ICD-10-CM | POA: Diagnosis not present

## 2022-10-11 DIAGNOSIS — N186 End stage renal disease: Secondary | ICD-10-CM | POA: Diagnosis not present

## 2022-10-11 DIAGNOSIS — G4733 Obstructive sleep apnea (adult) (pediatric): Secondary | ICD-10-CM | POA: Diagnosis not present

## 2022-10-11 DIAGNOSIS — Z992 Dependence on renal dialysis: Secondary | ICD-10-CM | POA: Diagnosis not present

## 2022-10-11 DIAGNOSIS — E1122 Type 2 diabetes mellitus with diabetic chronic kidney disease: Secondary | ICD-10-CM | POA: Diagnosis not present

## 2022-10-11 DIAGNOSIS — D631 Anemia in chronic kidney disease: Secondary | ICD-10-CM | POA: Diagnosis not present

## 2022-10-13 DIAGNOSIS — N2581 Secondary hyperparathyroidism of renal origin: Secondary | ICD-10-CM | POA: Diagnosis not present

## 2022-10-13 DIAGNOSIS — Z992 Dependence on renal dialysis: Secondary | ICD-10-CM | POA: Diagnosis not present

## 2022-10-13 DIAGNOSIS — E1122 Type 2 diabetes mellitus with diabetic chronic kidney disease: Secondary | ICD-10-CM | POA: Diagnosis not present

## 2022-10-13 DIAGNOSIS — N186 End stage renal disease: Secondary | ICD-10-CM | POA: Diagnosis not present

## 2022-10-13 DIAGNOSIS — D631 Anemia in chronic kidney disease: Secondary | ICD-10-CM | POA: Diagnosis not present

## 2022-10-16 DIAGNOSIS — Z992 Dependence on renal dialysis: Secondary | ICD-10-CM | POA: Diagnosis not present

## 2022-10-16 DIAGNOSIS — E1122 Type 2 diabetes mellitus with diabetic chronic kidney disease: Secondary | ICD-10-CM | POA: Diagnosis not present

## 2022-10-16 DIAGNOSIS — N2581 Secondary hyperparathyroidism of renal origin: Secondary | ICD-10-CM | POA: Diagnosis not present

## 2022-10-16 DIAGNOSIS — D631 Anemia in chronic kidney disease: Secondary | ICD-10-CM | POA: Diagnosis not present

## 2022-10-16 DIAGNOSIS — N186 End stage renal disease: Secondary | ICD-10-CM | POA: Diagnosis not present

## 2022-10-17 DIAGNOSIS — H26493 Other secondary cataract, bilateral: Secondary | ICD-10-CM | POA: Diagnosis not present

## 2022-10-17 DIAGNOSIS — E119 Type 2 diabetes mellitus without complications: Secondary | ICD-10-CM | POA: Diagnosis not present

## 2022-10-17 DIAGNOSIS — H35033 Hypertensive retinopathy, bilateral: Secondary | ICD-10-CM | POA: Diagnosis not present

## 2022-10-17 DIAGNOSIS — Z961 Presence of intraocular lens: Secondary | ICD-10-CM | POA: Diagnosis not present

## 2022-10-18 ENCOUNTER — Ambulatory Visit (INDEPENDENT_AMBULATORY_CARE_PROVIDER_SITE_OTHER): Payer: Medicare Other

## 2022-10-18 DIAGNOSIS — R55 Syncope and collapse: Secondary | ICD-10-CM | POA: Diagnosis not present

## 2022-10-18 DIAGNOSIS — N2581 Secondary hyperparathyroidism of renal origin: Secondary | ICD-10-CM | POA: Diagnosis not present

## 2022-10-18 DIAGNOSIS — Z992 Dependence on renal dialysis: Secondary | ICD-10-CM | POA: Diagnosis not present

## 2022-10-18 DIAGNOSIS — E1122 Type 2 diabetes mellitus with diabetic chronic kidney disease: Secondary | ICD-10-CM | POA: Diagnosis not present

## 2022-10-18 DIAGNOSIS — D631 Anemia in chronic kidney disease: Secondary | ICD-10-CM | POA: Diagnosis not present

## 2022-10-18 DIAGNOSIS — N186 End stage renal disease: Secondary | ICD-10-CM | POA: Diagnosis not present

## 2022-10-18 LAB — CUP PACEART REMOTE DEVICE CHECK
Date Time Interrogation Session: 20231025000355
Implantable Pulse Generator Implant Date: 20230201

## 2022-10-20 DIAGNOSIS — N186 End stage renal disease: Secondary | ICD-10-CM | POA: Diagnosis not present

## 2022-10-20 DIAGNOSIS — Z992 Dependence on renal dialysis: Secondary | ICD-10-CM | POA: Diagnosis not present

## 2022-10-20 DIAGNOSIS — E1122 Type 2 diabetes mellitus with diabetic chronic kidney disease: Secondary | ICD-10-CM | POA: Diagnosis not present

## 2022-10-20 DIAGNOSIS — D631 Anemia in chronic kidney disease: Secondary | ICD-10-CM | POA: Diagnosis not present

## 2022-10-20 DIAGNOSIS — N2581 Secondary hyperparathyroidism of renal origin: Secondary | ICD-10-CM | POA: Diagnosis not present

## 2022-10-23 DIAGNOSIS — N186 End stage renal disease: Secondary | ICD-10-CM | POA: Diagnosis not present

## 2022-10-23 DIAGNOSIS — D631 Anemia in chronic kidney disease: Secondary | ICD-10-CM | POA: Diagnosis not present

## 2022-10-23 DIAGNOSIS — E1122 Type 2 diabetes mellitus with diabetic chronic kidney disease: Secondary | ICD-10-CM | POA: Diagnosis not present

## 2022-10-23 DIAGNOSIS — N2581 Secondary hyperparathyroidism of renal origin: Secondary | ICD-10-CM | POA: Diagnosis not present

## 2022-10-23 DIAGNOSIS — Z992 Dependence on renal dialysis: Secondary | ICD-10-CM | POA: Diagnosis not present

## 2022-10-24 DIAGNOSIS — Z992 Dependence on renal dialysis: Secondary | ICD-10-CM | POA: Diagnosis not present

## 2022-10-24 DIAGNOSIS — I129 Hypertensive chronic kidney disease with stage 1 through stage 4 chronic kidney disease, or unspecified chronic kidney disease: Secondary | ICD-10-CM | POA: Diagnosis not present

## 2022-10-24 DIAGNOSIS — N186 End stage renal disease: Secondary | ICD-10-CM | POA: Diagnosis not present

## 2022-10-25 DIAGNOSIS — N186 End stage renal disease: Secondary | ICD-10-CM | POA: Diagnosis not present

## 2022-10-25 DIAGNOSIS — Z992 Dependence on renal dialysis: Secondary | ICD-10-CM | POA: Diagnosis not present

## 2022-10-25 DIAGNOSIS — N2581 Secondary hyperparathyroidism of renal origin: Secondary | ICD-10-CM | POA: Diagnosis not present

## 2022-10-25 DIAGNOSIS — D631 Anemia in chronic kidney disease: Secondary | ICD-10-CM | POA: Diagnosis not present

## 2022-10-27 DIAGNOSIS — N2581 Secondary hyperparathyroidism of renal origin: Secondary | ICD-10-CM | POA: Diagnosis not present

## 2022-10-27 DIAGNOSIS — Z992 Dependence on renal dialysis: Secondary | ICD-10-CM | POA: Diagnosis not present

## 2022-10-27 DIAGNOSIS — N186 End stage renal disease: Secondary | ICD-10-CM | POA: Diagnosis not present

## 2022-10-27 DIAGNOSIS — D631 Anemia in chronic kidney disease: Secondary | ICD-10-CM | POA: Diagnosis not present

## 2022-10-30 DIAGNOSIS — Z992 Dependence on renal dialysis: Secondary | ICD-10-CM | POA: Diagnosis not present

## 2022-10-30 DIAGNOSIS — N186 End stage renal disease: Secondary | ICD-10-CM | POA: Diagnosis not present

## 2022-10-30 DIAGNOSIS — N2581 Secondary hyperparathyroidism of renal origin: Secondary | ICD-10-CM | POA: Diagnosis not present

## 2022-10-30 DIAGNOSIS — D631 Anemia in chronic kidney disease: Secondary | ICD-10-CM | POA: Diagnosis not present

## 2022-10-31 DIAGNOSIS — E0590 Thyrotoxicosis, unspecified without thyrotoxic crisis or storm: Secondary | ICD-10-CM | POA: Diagnosis not present

## 2022-10-31 NOTE — Progress Notes (Signed)
[facility] Summary Report / Loop Recorder 

## 2022-11-01 DIAGNOSIS — D631 Anemia in chronic kidney disease: Secondary | ICD-10-CM | POA: Diagnosis not present

## 2022-11-01 DIAGNOSIS — N186 End stage renal disease: Secondary | ICD-10-CM | POA: Diagnosis not present

## 2022-11-01 DIAGNOSIS — Z992 Dependence on renal dialysis: Secondary | ICD-10-CM | POA: Diagnosis not present

## 2022-11-01 DIAGNOSIS — N2581 Secondary hyperparathyroidism of renal origin: Secondary | ICD-10-CM | POA: Diagnosis not present

## 2022-11-03 DIAGNOSIS — N2581 Secondary hyperparathyroidism of renal origin: Secondary | ICD-10-CM | POA: Diagnosis not present

## 2022-11-03 DIAGNOSIS — N186 End stage renal disease: Secondary | ICD-10-CM | POA: Diagnosis not present

## 2022-11-03 DIAGNOSIS — Z992 Dependence on renal dialysis: Secondary | ICD-10-CM | POA: Diagnosis not present

## 2022-11-03 DIAGNOSIS — D631 Anemia in chronic kidney disease: Secondary | ICD-10-CM | POA: Diagnosis not present

## 2022-11-06 DIAGNOSIS — N186 End stage renal disease: Secondary | ICD-10-CM | POA: Diagnosis not present

## 2022-11-06 DIAGNOSIS — D631 Anemia in chronic kidney disease: Secondary | ICD-10-CM | POA: Diagnosis not present

## 2022-11-06 DIAGNOSIS — Z992 Dependence on renal dialysis: Secondary | ICD-10-CM | POA: Diagnosis not present

## 2022-11-06 DIAGNOSIS — N2581 Secondary hyperparathyroidism of renal origin: Secondary | ICD-10-CM | POA: Diagnosis not present

## 2022-11-10 DIAGNOSIS — N2581 Secondary hyperparathyroidism of renal origin: Secondary | ICD-10-CM | POA: Diagnosis not present

## 2022-11-10 DIAGNOSIS — Z992 Dependence on renal dialysis: Secondary | ICD-10-CM | POA: Diagnosis not present

## 2022-11-10 DIAGNOSIS — N186 End stage renal disease: Secondary | ICD-10-CM | POA: Diagnosis not present

## 2022-11-10 DIAGNOSIS — D631 Anemia in chronic kidney disease: Secondary | ICD-10-CM | POA: Diagnosis not present

## 2022-11-13 DIAGNOSIS — D631 Anemia in chronic kidney disease: Secondary | ICD-10-CM | POA: Diagnosis not present

## 2022-11-13 DIAGNOSIS — N186 End stage renal disease: Secondary | ICD-10-CM | POA: Diagnosis not present

## 2022-11-13 DIAGNOSIS — N2581 Secondary hyperparathyroidism of renal origin: Secondary | ICD-10-CM | POA: Diagnosis not present

## 2022-11-13 DIAGNOSIS — Z992 Dependence on renal dialysis: Secondary | ICD-10-CM | POA: Diagnosis not present

## 2022-11-14 ENCOUNTER — Encounter: Payer: Self-pay | Admitting: Nurse Practitioner

## 2022-11-14 ENCOUNTER — Ambulatory Visit: Payer: Medicare Other | Attending: Nurse Practitioner | Admitting: Nurse Practitioner

## 2022-11-14 VITALS — BP 120/60 | HR 81 | Ht 62.0 in | Wt 115.4 lb

## 2022-11-14 DIAGNOSIS — R55 Syncope and collapse: Secondary | ICD-10-CM

## 2022-11-14 DIAGNOSIS — Z8673 Personal history of transient ischemic attack (TIA), and cerebral infarction without residual deficits: Secondary | ICD-10-CM

## 2022-11-14 DIAGNOSIS — E1122 Type 2 diabetes mellitus with diabetic chronic kidney disease: Secondary | ICD-10-CM | POA: Diagnosis not present

## 2022-11-14 DIAGNOSIS — E785 Hyperlipidemia, unspecified: Secondary | ICD-10-CM

## 2022-11-14 DIAGNOSIS — E0590 Thyrotoxicosis, unspecified without thyrotoxic crisis or storm: Secondary | ICD-10-CM | POA: Diagnosis not present

## 2022-11-14 DIAGNOSIS — Z720 Tobacco use: Secondary | ICD-10-CM | POA: Diagnosis not present

## 2022-11-14 DIAGNOSIS — N184 Chronic kidney disease, stage 4 (severe): Secondary | ICD-10-CM | POA: Diagnosis not present

## 2022-11-14 DIAGNOSIS — I10 Essential (primary) hypertension: Secondary | ICD-10-CM | POA: Diagnosis not present

## 2022-11-14 DIAGNOSIS — N186 End stage renal disease: Secondary | ICD-10-CM

## 2022-11-14 DIAGNOSIS — M8580 Other specified disorders of bone density and structure, unspecified site: Secondary | ICD-10-CM | POA: Diagnosis not present

## 2022-11-14 DIAGNOSIS — K219 Gastro-esophageal reflux disease without esophagitis: Secondary | ICD-10-CM | POA: Diagnosis not present

## 2022-11-14 DIAGNOSIS — E782 Mixed hyperlipidemia: Secondary | ICD-10-CM | POA: Diagnosis not present

## 2022-11-14 NOTE — Progress Notes (Signed)
Office Visit    Patient Name: Monica Hunt Date of Encounter: 11/14/2022  Primary Care Provider:  Lavone Orn, MD Primary Cardiologist:  Sanda Klein, MD  Chief Complaint    81 year old female with a history of syncope, hypertension, hyperlipidemia, CVA, type 2 diabetes, ESRD on HD, hyperthyroidism, restless legs syndrome, and GERD who presents for follow-up related to syncope.  Past Medical History    Past Medical History:  Diagnosis Date   Anemia    Cataracts, bilateral    Chronic kidney disease    ESRD  Dialysis M/W/F   Depression    Diabetes mellitus without complication ([facility])    GERD (gastroesophageal reflux disease)    Hypertension    Hyperthyroidism    Pneumonia    Stroke ([place])    symptom onset ~ 01/10/22, diagnosed 01/24/22 acute/subacute bilateral cerebral infarcts, largest left frontal lobe, extensive punctate chronic microhemorrages concerning for cerbral amyloid angiopathy   Past Surgical History:  Procedure Laterality Date   ABDOMINAL HYSTERECTOMY     vaginal   AV FISTULA PLACEMENT Right 05/02/2022   Procedure: RIGHT ARTERIOVENOUS (AV) FISTULA  CREATION;  Surgeon: Waynetta Sandy, MD;  Location: [facility];  Service: Vascular;  Laterality: Right;   CARPAL TUNNEL RELEASE Right    COLONOSCOPY WITH PROPOFOL N/A 11/17/2014   Procedure: COLONOSCOPY WITH PROPOFOL;  Surgeon: Garlan Fair, MD;  Location: [facility];  Service: Endoscopy;  Laterality: N/A;   LOOP RECORDER INSERTION N/A 01/25/2022   Procedure: LOOP RECORDER INSERTION;  Surgeon: Constance Haw, MD;  Location: [facility];  Service: Cardiovascular;  Laterality: N/A;    Allergies  Allergies  Allergen Reactions   Bee Venom Anaphylaxis   Wasp Venom Anaphylaxis    History of Present Illness    81 year old female with the above past medical history including syncope, hypertension, hyperlipidemia, CVA, type 2 diabetes, ESRD on HD, hyperthyroidism, restless legs syndrome, and  GERD.  She has a history of syncopal episodes in the past however, this resolved following reduction in dose of her beta-blocker.  Previous cardiac monitor showed persistent bradycardia.  ETT in 2020 was negative for ischemia. She was last seen in the office on 12/13/2021 and was stable from a cardiac standpoint.  She denies any recurrent syncope at the time. She was hospitalized in 01/2022 in the setting of stroke.  She underwent loop recorder insertion.  Echocardiogram at that time showed EF 55 to 60%, mild concentric LVH, G1 DD, normal RV systolic function, no significant valvular abnormalities.  Carotid dopplers revealed 1 to 39% B ICA stenosis.    She presents today for follow-up.  Since her last visit she has been stable from a cardiac standpoint.  She does note some generalized fatigue.  She is on HD on Mondays and Fridays.  She takes midodrine as needed for blood pressure support.  Denies any dizziness, presyncope, syncope.  Denies palpitations.  Overall, she reports feeling well.  Home Medications    Current Outpatient Medications  Medication Sig Dispense Refill   ACCU-CHEK SMARTVIEW test strip      acetaminophen (TYLENOL) 500 MG tablet Take 1,000 mg by mouth every 6 (six) hours as needed for mild pain.     aspirin EC 81 MG tablet Take 1 tablet (81 mg total) by mouth daily. Swallow whole. 30 tablet 11   atorvastatin (LIPITOR) 40 MG tablet Take 40 mg by mouth daily.     betamethasone valerate (VALISONE) 0.1 % cream Apply 1 application  topically 2 (two)  times daily as needed (poison oak).     EPINEPHrine 0.3 mg/0.3 mL IJ SOAJ injection Inject 0.3 mg into the muscle once.     gentamicin cream (GARAMYCIN) 0.1 % Apply 1 application  topically daily as needed (skin irritation around tube).     glipiZIDE (GLUCOTROL XL) 2.5 MG 24 hr tablet Take 2.5 mg by mouth daily as needed (high blood sugar).     methimazole (TAPAZOLE) 5 MG tablet Take 7.5 mg by mouth daily.     midodrine (PROAMATINE) 5 MG  tablet Take 5 mg by mouth 3 (three) times daily as needed (if BP is low).     Multiple Minerals-Vitamins (CAL-MAG-ZINC-D) TABS Take 1 tablet by mouth daily.     Multiple Vitamin (MULTIVITAMIN WITH MINERALS) TABS tablet Take 1 tablet by mouth daily.     omeprazole (PRILOSEC) 40 MG capsule Take 40 mg by mouth daily.     rOPINIRole (REQUIP) 3 MG tablet Take 3 mg by mouth at bedtime. Take 1 tab 1 to 3 hours before bedtime     traZODone (DESYREL) 50 MG tablet Take 100 mg by mouth at bedtime.     No current facility-administered medications for this visit.     Review of Systems    She denies chest pain, palpitations, dyspnea, pnd, orthopnea, n, v, dizziness, syncope, edema, weight gain, or early satiety. All other systems reviewed and are otherwise negative except as noted above.   Physical Exam    VS:  BP 120/60   Pulse 81   Ht 5\' 2"  (1.575 m)   Wt 115 lb 6.4 oz (52.3 kg)   SpO2 95%   BMI 21.11 kg/m  GEN: Well nourished, well developed, in no acute distress. HEENT: normal. Neck: Supple, no JVD, carotid bruits, or masses. Cardiac: RRR, no murmurs, rubs, or gallops. No clubbing, cyanosis, edema.  Radials/DP/PT 2+ and equal bilaterally.  Respiratory:  Respirations regular and unlabored, clear to auscultation bilaterally. GI: Soft, nontender, nondistended, BS + x 4. MS: no deformity or atrophy. Skin: warm and dry, no rash. Neuro:  Strength and sensation are intact. Psych: Normal affect.  Accessory Clinical Findings    ECG personally reviewed by me today -NSR, 81 bpm- no acute changes.   Lab Results  Component Value Date   WBC 5.9 01/26/2022   HGB 12.9 05/02/2022   HCT 38.0 05/02/2022   MCV 98.8 01/26/2022   PLT 156 01/26/2022   Lab Results  Component Value Date   CREATININE 4.00 (H) 05/02/2022   BUN 32 (H) 05/02/2022   NA 138 05/02/2022   K 4.2 05/02/2022   CL 101 05/02/2022   CO2 26 01/26/2022   Lab Results  Component Value Date   ALT 24 01/24/2022   AST 24  01/24/2022   ALKPHOS 106 01/24/2022   BILITOT 0.5 01/24/2022   Lab Results  Component Value Date   CHOL 126 01/25/2022   HDL 30 (L) 01/25/2022   LDLCALC 53 01/25/2022   TRIG 217 (H) 01/25/2022   CHOLHDL 4.2 01/25/2022    Lab Results  Component Value Date   HGBA1C 4.9 01/25/2022    Assessment & Plan    1. History of syncope: Resolved following discontinuation of beta-blocker therapy.  Prior cardiac monitor showed persistent bradycardia.  ETT in 2020 negative for ischemia.  Most recent echo in 01/2022 was normal.  Carotid Doppler showed 1 to 39% B ICA stenosis.  Denies any recurrent syncope.  Stable.  2. Hypertension: BP well controlled. Continue current antihypertensive  regimen.   3. Hyperlipidemia: LDL was 53 in 01/2022.  Monitored and managed per PCP.  4. History of CVA: Cryptogenic, s/p loop recorder.  Continue aspirin, Lipitor.  5. Type 2 diabetes: A1c was 6.4 in 06/2022.  Monitored and managed per PCP.  6. ESRD: Recently transitioned from PD to HD.  Follows with nephrology.  7. Hypothyroidism: TSH was 0.01 in 10/2022.  Stable.  8. Tobacco use: She smokes 5 to 10 cigarettes a day.  Full cessation advised.  9. Disposition: Follow-up in 1 year.      Lenna Sciara, NP 11/14/2022, 8:34 AM

## 2022-11-14 NOTE — Patient Instructions (Signed)
Medication Instructions:   Your physician recommends that you continue on your current medications as directed. Please refer to the Current Medication list given to you today.  *If you need a refill on your cardiac medications before your next appointment, please call your pharmacy*  Lab Work: NONE ordered at this time of appointment   If you have labs (blood work) drawn today and your tests are completely normal, you will receive your results only by: MyChart Message (if you have MyChart) OR A paper copy in the mail If you have any lab test that is abnormal or we need to change your treatment, we will call you to review the results.  Testing/Procedures: NONE ordered at this time of appointment   Follow-Up: At [facility], you and your health needs are our priority.  As part of our continuing mission to provide you with exceptional heart care, we have created designated Provider Care Teams.  These Care Teams include your primary Cardiologist (physician) and Advanced Practice Providers (APPs -  Physician Assistants and Nurse Practitioners) who all work together to provide you with the care you need, when you need it.  We recommend signing up for the patient portal called "MyChart".  Sign up information is provided on this After Visit Summary.  MyChart is used to connect with patients for Virtual Visits (Telemedicine).  Patients are able to view lab/test results, encounter notes, upcoming appointments, etc.  Non-urgent messages can be sent to your provider as well.   To learn more about what you can do with MyChart, go to [email or web address].    Your next appointment:   1 year(s)  The format for your next appointment:   In Person  Provider:   Mihai Croitoru, MD     Other Instructions  Important Information About Sugar       

## 2022-11-17 DIAGNOSIS — N186 End stage renal disease: Secondary | ICD-10-CM | POA: Diagnosis not present

## 2022-11-17 DIAGNOSIS — N2581 Secondary hyperparathyroidism of renal origin: Secondary | ICD-10-CM | POA: Diagnosis not present

## 2022-11-17 DIAGNOSIS — Z992 Dependence on renal dialysis: Secondary | ICD-10-CM | POA: Diagnosis not present

## 2022-11-17 DIAGNOSIS — D631 Anemia in chronic kidney disease: Secondary | ICD-10-CM | POA: Diagnosis not present

## 2022-11-20 ENCOUNTER — Ambulatory Visit (INDEPENDENT_AMBULATORY_CARE_PROVIDER_SITE_OTHER): Payer: Medicare Other

## 2022-11-20 DIAGNOSIS — N186 End stage renal disease: Secondary | ICD-10-CM | POA: Diagnosis not present

## 2022-11-20 DIAGNOSIS — Z8673 Personal history of transient ischemic attack (TIA), and cerebral infarction without residual deficits: Secondary | ICD-10-CM | POA: Diagnosis not present

## 2022-11-20 DIAGNOSIS — Z992 Dependence on renal dialysis: Secondary | ICD-10-CM | POA: Diagnosis not present

## 2022-11-20 DIAGNOSIS — D631 Anemia in chronic kidney disease: Secondary | ICD-10-CM | POA: Diagnosis not present

## 2022-11-20 DIAGNOSIS — N2581 Secondary hyperparathyroidism of renal origin: Secondary | ICD-10-CM | POA: Diagnosis not present

## 2022-11-21 LAB — CUP PACEART REMOTE DEVICE CHECK
Date Time Interrogation Session: 20231127000505
Implantable Pulse Generator Implant Date: 20230201

## 2022-11-23 DIAGNOSIS — N186 End stage renal disease: Secondary | ICD-10-CM | POA: Diagnosis not present

## 2022-11-23 DIAGNOSIS — I129 Hypertensive chronic kidney disease with stage 1 through stage 4 chronic kidney disease, or unspecified chronic kidney disease: Secondary | ICD-10-CM | POA: Diagnosis not present

## 2022-11-23 DIAGNOSIS — Z992 Dependence on renal dialysis: Secondary | ICD-10-CM | POA: Diagnosis not present

## 2022-11-24 DIAGNOSIS — N186 End stage renal disease: Secondary | ICD-10-CM | POA: Diagnosis not present

## 2022-11-24 DIAGNOSIS — Z992 Dependence on renal dialysis: Secondary | ICD-10-CM | POA: Diagnosis not present

## 2022-11-24 DIAGNOSIS — N2581 Secondary hyperparathyroidism of renal origin: Secondary | ICD-10-CM | POA: Diagnosis not present

## 2022-11-24 DIAGNOSIS — D631 Anemia in chronic kidney disease: Secondary | ICD-10-CM | POA: Diagnosis not present

## 2022-11-27 DIAGNOSIS — Z992 Dependence on renal dialysis: Secondary | ICD-10-CM | POA: Diagnosis not present

## 2022-11-27 DIAGNOSIS — N2581 Secondary hyperparathyroidism of renal origin: Secondary | ICD-10-CM | POA: Diagnosis not present

## 2022-11-27 DIAGNOSIS — N186 End stage renal disease: Secondary | ICD-10-CM | POA: Diagnosis not present

## 2022-11-27 DIAGNOSIS — D631 Anemia in chronic kidney disease: Secondary | ICD-10-CM | POA: Diagnosis not present

## 2022-12-01 DIAGNOSIS — Z992 Dependence on renal dialysis: Secondary | ICD-10-CM | POA: Diagnosis not present

## 2022-12-01 DIAGNOSIS — D631 Anemia in chronic kidney disease: Secondary | ICD-10-CM | POA: Diagnosis not present

## 2022-12-01 DIAGNOSIS — N186 End stage renal disease: Secondary | ICD-10-CM | POA: Diagnosis not present

## 2022-12-01 DIAGNOSIS — N2581 Secondary hyperparathyroidism of renal origin: Secondary | ICD-10-CM | POA: Diagnosis not present

## 2022-12-04 DIAGNOSIS — N186 End stage renal disease: Secondary | ICD-10-CM | POA: Diagnosis not present

## 2022-12-04 DIAGNOSIS — Z992 Dependence on renal dialysis: Secondary | ICD-10-CM | POA: Diagnosis not present

## 2022-12-04 DIAGNOSIS — N2581 Secondary hyperparathyroidism of renal origin: Secondary | ICD-10-CM | POA: Diagnosis not present

## 2022-12-04 DIAGNOSIS — D631 Anemia in chronic kidney disease: Secondary | ICD-10-CM | POA: Diagnosis not present

## 2022-12-08 DIAGNOSIS — N186 End stage renal disease: Secondary | ICD-10-CM | POA: Diagnosis not present

## 2022-12-08 DIAGNOSIS — Z992 Dependence on renal dialysis: Secondary | ICD-10-CM | POA: Diagnosis not present

## 2022-12-08 DIAGNOSIS — N2581 Secondary hyperparathyroidism of renal origin: Secondary | ICD-10-CM | POA: Diagnosis not present

## 2022-12-08 DIAGNOSIS — D631 Anemia in chronic kidney disease: Secondary | ICD-10-CM | POA: Diagnosis not present

## 2022-12-11 DIAGNOSIS — N2581 Secondary hyperparathyroidism of renal origin: Secondary | ICD-10-CM | POA: Diagnosis not present

## 2022-12-11 DIAGNOSIS — Z992 Dependence on renal dialysis: Secondary | ICD-10-CM | POA: Diagnosis not present

## 2022-12-11 DIAGNOSIS — D631 Anemia in chronic kidney disease: Secondary | ICD-10-CM | POA: Diagnosis not present

## 2022-12-11 DIAGNOSIS — N186 End stage renal disease: Secondary | ICD-10-CM | POA: Diagnosis not present

## 2022-12-15 DIAGNOSIS — N186 End stage renal disease: Secondary | ICD-10-CM | POA: Diagnosis not present

## 2022-12-15 DIAGNOSIS — D631 Anemia in chronic kidney disease: Secondary | ICD-10-CM | POA: Diagnosis not present

## 2022-12-15 DIAGNOSIS — N2581 Secondary hyperparathyroidism of renal origin: Secondary | ICD-10-CM | POA: Diagnosis not present

## 2022-12-15 DIAGNOSIS — Z992 Dependence on renal dialysis: Secondary | ICD-10-CM | POA: Diagnosis not present

## 2022-12-17 DIAGNOSIS — Z992 Dependence on renal dialysis: Secondary | ICD-10-CM | POA: Diagnosis not present

## 2022-12-17 DIAGNOSIS — N2581 Secondary hyperparathyroidism of renal origin: Secondary | ICD-10-CM | POA: Diagnosis not present

## 2022-12-17 DIAGNOSIS — N186 End stage renal disease: Secondary | ICD-10-CM | POA: Diagnosis not present

## 2022-12-17 DIAGNOSIS — D631 Anemia in chronic kidney disease: Secondary | ICD-10-CM | POA: Diagnosis not present

## 2022-12-22 DIAGNOSIS — Z992 Dependence on renal dialysis: Secondary | ICD-10-CM | POA: Diagnosis not present

## 2022-12-22 DIAGNOSIS — N186 End stage renal disease: Secondary | ICD-10-CM | POA: Diagnosis not present

## 2022-12-22 DIAGNOSIS — N2581 Secondary hyperparathyroidism of renal origin: Secondary | ICD-10-CM | POA: Diagnosis not present

## 2022-12-22 DIAGNOSIS — D631 Anemia in chronic kidney disease: Secondary | ICD-10-CM | POA: Diagnosis not present

## 2022-12-24 DIAGNOSIS — N2581 Secondary hyperparathyroidism of renal origin: Secondary | ICD-10-CM | POA: Diagnosis not present

## 2022-12-24 DIAGNOSIS — N186 End stage renal disease: Secondary | ICD-10-CM | POA: Diagnosis not present

## 2022-12-24 DIAGNOSIS — Z992 Dependence on renal dialysis: Secondary | ICD-10-CM | POA: Diagnosis not present

## 2022-12-24 DIAGNOSIS — D631 Anemia in chronic kidney disease: Secondary | ICD-10-CM | POA: Diagnosis not present

## 2022-12-24 DIAGNOSIS — I129 Hypertensive chronic kidney disease with stage 1 through stage 4 chronic kidney disease, or unspecified chronic kidney disease: Secondary | ICD-10-CM | POA: Diagnosis not present

## 2022-12-26 ENCOUNTER — Ambulatory Visit (INDEPENDENT_AMBULATORY_CARE_PROVIDER_SITE_OTHER): Payer: Medicare Other

## 2022-12-26 DIAGNOSIS — Z8673 Personal history of transient ischemic attack (TIA), and cerebral infarction without residual deficits: Secondary | ICD-10-CM | POA: Diagnosis not present

## 2022-12-26 LAB — CUP PACEART REMOTE DEVICE CHECK
Date Time Interrogation Session: 20240102000620
Implantable Pulse Generator Implant Date: 20230201

## 2023-01-01 NOTE — Progress Notes (Signed)
[facility] Summary Report / Loop Recorder 

## 2023-01-26 NOTE — Progress Notes (Signed)
[facility] Summary Report / Loop Recorder 

## 2023-01-28 LAB — CUP PACEART REMOTE DEVICE CHECK
Date Time Interrogation Session: 20240204000622
Implantable Pulse Generator Implant Date: 20230201

## 2023-01-29 ENCOUNTER — Ambulatory Visit: Payer: Medicare Other

## 2023-01-29 DIAGNOSIS — Z8673 Personal history of transient ischemic attack (TIA), and cerebral infarction without residual deficits: Secondary | ICD-10-CM

## 2023-02-23 DIAGNOSIS — Z992 Dependence on renal dialysis: Secondary | ICD-10-CM | POA: Diagnosis not present

## 2023-02-23 DIAGNOSIS — D631 Anemia in chronic kidney disease: Secondary | ICD-10-CM | POA: Diagnosis not present

## 2023-02-23 DIAGNOSIS — D509 Iron deficiency anemia, unspecified: Secondary | ICD-10-CM | POA: Diagnosis not present

## 2023-02-23 DIAGNOSIS — N186 End stage renal disease: Secondary | ICD-10-CM | POA: Diagnosis not present

## 2023-02-23 DIAGNOSIS — N2581 Secondary hyperparathyroidism of renal origin: Secondary | ICD-10-CM | POA: Diagnosis not present

## 2023-02-26 DIAGNOSIS — D631 Anemia in chronic kidney disease: Secondary | ICD-10-CM | POA: Diagnosis not present

## 2023-02-26 DIAGNOSIS — N2581 Secondary hyperparathyroidism of renal origin: Secondary | ICD-10-CM | POA: Diagnosis not present

## 2023-02-26 DIAGNOSIS — N186 End stage renal disease: Secondary | ICD-10-CM | POA: Diagnosis not present

## 2023-02-26 DIAGNOSIS — Z992 Dependence on renal dialysis: Secondary | ICD-10-CM | POA: Diagnosis not present

## 2023-02-26 DIAGNOSIS — D509 Iron deficiency anemia, unspecified: Secondary | ICD-10-CM | POA: Diagnosis not present

## 2023-03-02 DIAGNOSIS — N2581 Secondary hyperparathyroidism of renal origin: Secondary | ICD-10-CM | POA: Diagnosis not present

## 2023-03-02 DIAGNOSIS — D631 Anemia in chronic kidney disease: Secondary | ICD-10-CM | POA: Diagnosis not present

## 2023-03-02 DIAGNOSIS — Z992 Dependence on renal dialysis: Secondary | ICD-10-CM | POA: Diagnosis not present

## 2023-03-02 DIAGNOSIS — D509 Iron deficiency anemia, unspecified: Secondary | ICD-10-CM | POA: Diagnosis not present

## 2023-03-02 DIAGNOSIS — N186 End stage renal disease: Secondary | ICD-10-CM | POA: Diagnosis not present

## 2023-03-05 ENCOUNTER — Ambulatory Visit (INDEPENDENT_AMBULATORY_CARE_PROVIDER_SITE_OTHER): Payer: Medicare Other

## 2023-03-05 DIAGNOSIS — I639 Cerebral infarction, unspecified: Secondary | ICD-10-CM | POA: Diagnosis not present

## 2023-03-05 DIAGNOSIS — F17210 Nicotine dependence, cigarettes, uncomplicated: Secondary | ICD-10-CM | POA: Diagnosis not present

## 2023-03-05 DIAGNOSIS — R0602 Shortness of breath: Secondary | ICD-10-CM | POA: Diagnosis not present

## 2023-03-05 DIAGNOSIS — R9431 Abnormal electrocardiogram [ECG] [EKG]: Secondary | ICD-10-CM | POA: Diagnosis not present

## 2023-03-05 DIAGNOSIS — I445 Left posterior fascicular block: Secondary | ICD-10-CM | POA: Diagnosis not present

## 2023-03-05 DIAGNOSIS — I214 Non-ST elevation (NSTEMI) myocardial infarction: Secondary | ICD-10-CM | POA: Diagnosis not present

## 2023-03-05 DIAGNOSIS — I132 Hypertensive heart and chronic kidney disease with heart failure and with stage 5 chronic kidney disease, or end stage renal disease: Secondary | ICD-10-CM | POA: Diagnosis not present

## 2023-03-05 DIAGNOSIS — E872 Acidosis, unspecified: Secondary | ICD-10-CM | POA: Diagnosis not present

## 2023-03-05 DIAGNOSIS — J209 Acute bronchitis, unspecified: Secondary | ICD-10-CM | POA: Diagnosis not present

## 2023-03-05 DIAGNOSIS — R0600 Dyspnea, unspecified: Secondary | ICD-10-CM | POA: Diagnosis not present

## 2023-03-05 DIAGNOSIS — I509 Heart failure, unspecified: Secondary | ICD-10-CM | POA: Diagnosis not present

## 2023-03-05 DIAGNOSIS — J810 Acute pulmonary edema: Secondary | ICD-10-CM | POA: Diagnosis not present

## 2023-03-05 DIAGNOSIS — Z20822 Contact with and (suspected) exposure to covid-19: Secondary | ICD-10-CM | POA: Diagnosis not present

## 2023-03-05 DIAGNOSIS — R059 Cough, unspecified: Secondary | ICD-10-CM | POA: Diagnosis not present

## 2023-03-05 DIAGNOSIS — R051 Acute cough: Secondary | ICD-10-CM | POA: Diagnosis not present

## 2023-03-05 DIAGNOSIS — Z992 Dependence on renal dialysis: Secondary | ICD-10-CM | POA: Diagnosis not present

## 2023-03-05 DIAGNOSIS — N186 End stage renal disease: Secondary | ICD-10-CM | POA: Diagnosis not present

## 2023-03-05 DIAGNOSIS — Z7982 Long term (current) use of aspirin: Secondary | ICD-10-CM | POA: Diagnosis not present

## 2023-03-05 DIAGNOSIS — R509 Fever, unspecified: Secondary | ICD-10-CM | POA: Diagnosis not present

## 2023-03-05 DIAGNOSIS — Z79899 Other long term (current) drug therapy: Secondary | ICD-10-CM | POA: Diagnosis not present

## 2023-03-05 DIAGNOSIS — E119 Type 2 diabetes mellitus without complications: Secondary | ICD-10-CM | POA: Diagnosis not present

## 2023-03-06 DIAGNOSIS — I214 Non-ST elevation (NSTEMI) myocardial infarction: Secondary | ICD-10-CM | POA: Diagnosis not present

## 2023-03-06 DIAGNOSIS — N186 End stage renal disease: Secondary | ICD-10-CM | POA: Diagnosis not present

## 2023-03-06 DIAGNOSIS — J810 Acute pulmonary edema: Secondary | ICD-10-CM | POA: Diagnosis not present

## 2023-03-06 DIAGNOSIS — E872 Acidosis, unspecified: Secondary | ICD-10-CM | POA: Diagnosis not present

## 2023-03-07 LAB — CUP PACEART REMOTE DEVICE CHECK
Date Time Interrogation Session: 20240311000227
Implantable Pulse Generator Implant Date: 20230201

## 2023-03-09 DIAGNOSIS — Z992 Dependence on renal dialysis: Secondary | ICD-10-CM | POA: Diagnosis not present

## 2023-03-09 DIAGNOSIS — D631 Anemia in chronic kidney disease: Secondary | ICD-10-CM | POA: Diagnosis not present

## 2023-03-09 DIAGNOSIS — N2581 Secondary hyperparathyroidism of renal origin: Secondary | ICD-10-CM | POA: Diagnosis not present

## 2023-03-09 DIAGNOSIS — D509 Iron deficiency anemia, unspecified: Secondary | ICD-10-CM | POA: Diagnosis not present

## 2023-03-09 DIAGNOSIS — N186 End stage renal disease: Secondary | ICD-10-CM | POA: Diagnosis not present

## 2023-03-12 DIAGNOSIS — D631 Anemia in chronic kidney disease: Secondary | ICD-10-CM | POA: Diagnosis not present

## 2023-03-12 DIAGNOSIS — N2581 Secondary hyperparathyroidism of renal origin: Secondary | ICD-10-CM | POA: Diagnosis not present

## 2023-03-12 DIAGNOSIS — Z992 Dependence on renal dialysis: Secondary | ICD-10-CM | POA: Diagnosis not present

## 2023-03-12 DIAGNOSIS — D509 Iron deficiency anemia, unspecified: Secondary | ICD-10-CM | POA: Diagnosis not present

## 2023-03-12 DIAGNOSIS — N186 End stage renal disease: Secondary | ICD-10-CM | POA: Diagnosis not present

## 2023-03-15 NOTE — Progress Notes (Signed)
[facility] Summary Report / Loop Recorder 

## 2023-03-16 DIAGNOSIS — D509 Iron deficiency anemia, unspecified: Secondary | ICD-10-CM | POA: Diagnosis not present

## 2023-03-16 DIAGNOSIS — Z992 Dependence on renal dialysis: Secondary | ICD-10-CM | POA: Diagnosis not present

## 2023-03-16 DIAGNOSIS — N2581 Secondary hyperparathyroidism of renal origin: Secondary | ICD-10-CM | POA: Diagnosis not present

## 2023-03-16 DIAGNOSIS — D631 Anemia in chronic kidney disease: Secondary | ICD-10-CM | POA: Diagnosis not present

## 2023-03-16 DIAGNOSIS — N186 End stage renal disease: Secondary | ICD-10-CM | POA: Diagnosis not present

## 2023-03-17 ENCOUNTER — Emergency Department (HOSPITAL_COMMUNITY)
Admission: EM | Admit: 2023-03-17 | Discharge: 2023-03-18 | Disposition: A | Discharge status: Home / Self Care | Payer: Medicare Other | Attending: Emergency Medicine | Admitting: Emergency Medicine

## 2023-03-17 ENCOUNTER — Other Ambulatory Visit: Payer: Self-pay

## 2023-03-17 ENCOUNTER — Emergency Department (HOSPITAL_COMMUNITY): Payer: Medicare Other

## 2023-03-17 ENCOUNTER — Encounter (HOSPITAL_COMMUNITY): Payer: Self-pay | Admitting: *Deleted

## 2023-03-17 DIAGNOSIS — Z7984 Long term (current) use of oral hypoglycemic drugs: Secondary | ICD-10-CM | POA: Diagnosis not present

## 2023-03-17 DIAGNOSIS — N186 End stage renal disease: Secondary | ICD-10-CM | POA: Insufficient documentation

## 2023-03-17 DIAGNOSIS — R0602 Shortness of breath: Secondary | ICD-10-CM | POA: Insufficient documentation

## 2023-03-17 DIAGNOSIS — E1122 Type 2 diabetes mellitus with diabetic chronic kidney disease: Secondary | ICD-10-CM | POA: Insufficient documentation

## 2023-03-17 DIAGNOSIS — J90 Pleural effusion, not elsewhere classified: Secondary | ICD-10-CM | POA: Diagnosis not present

## 2023-03-17 DIAGNOSIS — Z992 Dependence on renal dialysis: Secondary | ICD-10-CM | POA: Diagnosis not present

## 2023-03-17 DIAGNOSIS — I120 Hypertensive chronic kidney disease with stage 5 chronic kidney disease or end stage renal disease: Secondary | ICD-10-CM | POA: Diagnosis not present

## 2023-03-17 DIAGNOSIS — R079 Chest pain, unspecified: Secondary | ICD-10-CM | POA: Diagnosis not present

## 2023-03-17 DIAGNOSIS — Z7982 Long term (current) use of aspirin: Secondary | ICD-10-CM | POA: Insufficient documentation

## 2023-03-17 DIAGNOSIS — J811 Chronic pulmonary edema: Secondary | ICD-10-CM | POA: Diagnosis not present

## 2023-03-17 LAB — BASIC METABOLIC PANEL
Anion gap: 13 (ref 5–15)
BUN: 31 mg/dL — ABNORMAL HIGH (ref 8–23)
CO2: 28 mmol/L (ref 22–32)
Calcium: 9 mg/dL (ref 8.9–10.3)
Chloride: 98 mmol/L (ref 98–111)
Creatinine, Ser: 5.56 mg/dL — ABNORMAL HIGH (ref 0.44–1.00)
GFR, Estimated: 7 mL/min — ABNORMAL LOW (ref >60)
Glucose, Bld: 107 mg/dL — ABNORMAL HIGH (ref 70–99)
Potassium: 4.1 mmol/L (ref 3.5–5.1)
Sodium: 139 mmol/L (ref 135–145)

## 2023-03-17 LAB — TROPONIN I (HIGH SENSITIVITY)
Troponin I (High Sensitivity): 49 ng/L — ABNORMAL HIGH (ref <18)
Troponin I (High Sensitivity): 52 ng/L — ABNORMAL HIGH (ref <18)

## 2023-03-17 LAB — CBC
HCT: 29.9 % — ABNORMAL LOW (ref 36.0–46.0)
Hemoglobin: 9.7 g/dL — ABNORMAL LOW (ref 12.0–15.0)
MCH: 29.2 pg (ref 26.0–34.0)
MCHC: 32.4 g/dL (ref 30.0–36.0)
MCV: 90.1 fL (ref 80.0–100.0)
Platelets: 182 10*3/uL (ref 150–400)
RBC: 3.32 MIL/uL — ABNORMAL LOW (ref 3.87–5.11)
RDW: 16.5 % — ABNORMAL HIGH (ref 11.5–15.5)
WBC: 6.3 10*3/uL (ref 4.0–10.5)
nRBC: 0 % (ref 0.0–0.2)

## 2023-03-17 MED ORDER — BENZONATATE 100 MG PO CAPS: 100.0000 mg | ORAL_CAPSULE | Freq: Three times a day (TID) | ORAL | 0 refills | Status: DC

## 2023-03-17 MED ORDER — BENZONATATE 100 MG PO CAPS
100.0000 mg | ORAL_CAPSULE | Freq: Once | ORAL | Status: AC
Start: 2023-03-17 — End: 2023-03-17
  Administered 2023-03-17: 100 mg via ORAL
  Filled 2023-03-17: qty 1

## 2023-03-17 NOTE — Discharge Instructions (Signed)
Take tessalon pearls for cough.   Go to dialysis center on Monday for dialysis. You may need to get extra fluids removed   See your kidney doctor and cardiologist for follow up   Return to ER if you have worse shortness of breath, cough, fever

## 2023-03-17 NOTE — ED Triage Notes (Signed)
The pt ia c/o sob for 24 hours  no feet and leg swelling.    Chest pain

## 2023-03-17 NOTE — ED Provider Notes (Signed)
Care assumed from Dr. Darl Householder, dialysis patient with shortness of breath and mildly elevated initial troponin pending repeat troponin. Anticipate discharge if not changing.  Repeat troponin is essentially unchanged, patient is safe for discharge.  She is to go for her scheduled dialysis.   Delora Fuel, MD [id number] [phone]

## 2023-03-17 NOTE — ED Notes (Signed)
To x-ray

## 2023-03-17 NOTE — ED Provider Notes (Signed)
[facility] Provider Note   CSN: [id number] Arrival date & time: 03/17/23  2031     History  Chief Complaint  Patient presents with   Shortness of Breath    Monica Hunt is a 82 y.o. female history of ESRD on dialysis, diabetes, here presenting with shortness of breath.  Patient states that she started having shortness of breath today.  She states that it is worse with exertion.  Patient states that she just went to dialysis yesterday and finished a full treatment of dialysis.  Patient does follow-up with cardiology but has no stents in her heart.  Patient actually had similar symptoms and went to [facility] about 2 weeks ago.  She states that she was in the ED and was dialyzed and went home.  She states that her symptoms briefly improved and then returned.  She is on dialysis only 2 days a week on Monday and Friday.  The history is provided by the patient.       Home Medications Prior to Admission medications   Medication Sig Start Date End Date Taking? Authorizing Provider  ACCU-CHEK SMARTVIEW test strip  01/12/20   [provider]  acetaminophen (TYLENOL) 500 MG tablet Take 1,000 mg by mouth every 6 (six) hours as needed for mild pain.    [provider]  aspirin EC 81 MG tablet Take 1 tablet (81 mg total) by mouth daily. Swallow whole. 01/26/22   [name], MD  atorvastatin (LIPITOR) 40 MG tablet Take 40 mg by mouth daily.    [provider]  betamethasone valerate (VALISONE) 0.1 % cream Apply 1 application  topically 2 (two) times daily as needed (poison oak).    [provider]  EPINEPHrine 0.3 mg/0.3 mL IJ SOAJ injection Inject 0.3 mg into the muscle once.    [provider]  gentamicin cream (GARAMYCIN) 0.1 % Apply 1 application  topically daily as needed (skin irritation around tube). 07/26/21   [provider]  glipiZIDE (GLUCOTROL XL) 2.5 MG 24 hr tablet Take 2.5 mg by  mouth daily as needed (high blood sugar). 08/16/21   [provider]  methimazole (TAPAZOLE) 5 MG tablet Take 7.5 mg by mouth daily.    [provider]  midodrine (PROAMATINE) 5 MG tablet Take 5 mg by mouth 3 (three) times daily as needed (if BP is low). 01/10/22   [provider]  Multiple Minerals-Vitamins (CAL-MAG-ZINC-D) TABS Take 1 tablet by mouth daily.    [provider]  Multiple Vitamin (MULTIVITAMIN WITH MINERALS) TABS tablet Take 1 tablet by mouth daily.    [provider]  omeprazole (PRILOSEC) 40 MG capsule Take 40 mg by mouth daily.    [provider]  rOPINIRole (REQUIP) 3 MG tablet Take 3 mg by mouth at bedtime. Take 1 tab 1 to 3 hours before bedtime    [provider]  traZODone (DESYREL) 50 MG tablet Take 100 mg by mouth at bedtime.    [provider]      Allergies    Bee venom and Wasp venom    Review of Systems   Review of Systems  Respiratory:  Positive for shortness of breath.   All other systems reviewed and are negative.   Physical Exam Updated Vital Signs BP (!) 159/69 (BP Location: Left Arm)   Pulse 80   Temp 97.9 F (36.6 C)   Resp 17   Ht 5\' 2"  (1.575 m)  Wt 52.3 kg   SpO2 96%   BMI 21.09 kg/m  Physical Exam Vitals and nursing note reviewed.  HENT:     Head: Normocephalic.     Mouth/Throat:     Mouth: Mucous membranes are moist.     Pharynx: Oropharynx is clear.  Eyes:     Extraocular Movements: Extraocular movements intact.     Pupils: Pupils are equal, round, and reactive to light.  Cardiovascular:     Rate and Rhythm: Regular rhythm.  Pulmonary:     Comments: Diminished bilateral bases Abdominal:     General: Bowel sounds are normal.     Palpations: Abdomen is soft.  Musculoskeletal:        General: Normal range of motion.     Cervical back: Normal range of motion and neck supple.  Skin:    General: Skin is warm.     Capillary Refill: Capillary refill takes less  than 2 seconds.  Neurological:     General: No focal deficit present.     Mental Status: She is alert and oriented to person, place, and time.  Psychiatric:        Mood and Affect: Mood normal.        Behavior: Behavior normal.     ED Results / Procedures / Treatments   Labs (all labs ordered are listed, but only abnormal results are displayed) Labs Reviewed  CBC - Abnormal; Notable for the following components:      Result Value   RBC 3.32 (*)    Hemoglobin 9.7 (*)    HCT 29.9 (*)    RDW 16.5 (*)    All other components within normal limits  BASIC METABOLIC PANEL  TROPONIN I (HIGH SENSITIVITY)    EKG None  Radiology DG Chest 2 View  Result Date: 03/17/2023 CLINICAL DATA:  sob posterior chest pain  no feet or ankle swelling EXAM: CHEST - 2 VIEW COMPARISON:  Chest x-ray 11/19/2017 FINDINGS: Right chest wall dialysis catheter with tip overlying the right atrium. Wireless cardiac device overlying the left chest. The heart and mediastinal contours are within normal limits. Aortic calcification. No focal consolidation. Increased interstitial markings. Bilateral trace pleural effusions. No pneumothorax. No acute osseous abnormality. IMPRESSION: 1. Pulmonary edema with bilateral trace pleural effusions. 2.  Aortic Atherosclerosis (ICD10-I70.0). Electronically Signed   By: Iven Finn M.D.   On: 03/17/2023 21:50    Procedures Procedures    Medications Ordered in ED Medications - No data to display  ED Course/ Medical Decision Making/ A&P                             Medical Decision Making Monica Hunt is a 82 y.o. female here presenting with shortness of breath.  Patient is a dialysis patient and has some shortness of breath today.  Patient appears slightly volume overloaded.  Plan to get CBC and BMP and troponin and chest x-ray.  I have low suspicion for ACS.  Initially she told me that the symptoms were today but it actually has been going on for several days.  11:01  PM I reviewed patient's labs and independently reviewed cxr. CXR showed pulmonary edema. Cr is baseline around 5.5. Trop is 49.  Patient has no oxygen requirement.  I discussed getting dialyzed tonight and then go home afterwards.  She states that she did that in [place] and would like to just go to dialysis center on Monday.  She  was also told that her heart enzymes was elevated in [place] so we will get second troponin.  Patient request cough medicine.  11:17 PM Patient signed out to Dr. Roxanne Mins to follow up second troponin. If second troponin negative, anticipate dc home with cough medicine. Has appointment with dialysis center on Monday and told her to return if she has worsening shortness of breath.   Problems Addressed: ESRD (end stage renal disease) on dialysis [facility]): chronic illness or injury Shortness of breath: acute illness or injury  Amount and/or Complexity of Data Reviewed Labs: ordered. Decision-making details documented in ED Course. Radiology: ordered and independent interpretation performed. Decision-making details documented in ED Course. ECG/medicine tests: ordered and independent interpretation performed. Decision-making details documented in ED Course.  Risk Prescription drug management.    Final Clinical Impression(s) / ED Diagnoses Final diagnoses:  None    Rx / DC Orders ED Discharge Orders     None         Drenda Freeze, MD 03/17/23 2320

## 2023-03-19 DIAGNOSIS — N2581 Secondary hyperparathyroidism of renal origin: Secondary | ICD-10-CM | POA: Diagnosis not present

## 2023-03-19 DIAGNOSIS — D631 Anemia in chronic kidney disease: Secondary | ICD-10-CM | POA: Diagnosis not present

## 2023-03-19 DIAGNOSIS — N186 End stage renal disease: Secondary | ICD-10-CM | POA: Diagnosis not present

## 2023-03-19 DIAGNOSIS — Z992 Dependence on renal dialysis: Secondary | ICD-10-CM | POA: Diagnosis not present

## 2023-03-19 DIAGNOSIS — D509 Iron deficiency anemia, unspecified: Secondary | ICD-10-CM | POA: Diagnosis not present

## 2023-03-22 DIAGNOSIS — E782 Mixed hyperlipidemia: Secondary | ICD-10-CM | POA: Diagnosis not present

## 2023-03-22 DIAGNOSIS — N184 Chronic kidney disease, stage 4 (severe): Secondary | ICD-10-CM | POA: Diagnosis not present

## 2023-03-22 DIAGNOSIS — I10 Essential (primary) hypertension: Secondary | ICD-10-CM | POA: Diagnosis not present

## 2023-03-22 DIAGNOSIS — G4700 Insomnia, unspecified: Secondary | ICD-10-CM | POA: Diagnosis not present

## 2023-03-22 DIAGNOSIS — E1122 Type 2 diabetes mellitus with diabetic chronic kidney disease: Secondary | ICD-10-CM | POA: Diagnosis not present

## 2023-03-22 DIAGNOSIS — K219 Gastro-esophageal reflux disease without esophagitis: Secondary | ICD-10-CM | POA: Diagnosis not present

## 2023-03-23 DIAGNOSIS — N2581 Secondary hyperparathyroidism of renal origin: Secondary | ICD-10-CM | POA: Diagnosis not present

## 2023-03-23 DIAGNOSIS — Z992 Dependence on renal dialysis: Secondary | ICD-10-CM | POA: Diagnosis not present

## 2023-03-23 DIAGNOSIS — D509 Iron deficiency anemia, unspecified: Secondary | ICD-10-CM | POA: Diagnosis not present

## 2023-03-23 DIAGNOSIS — N186 End stage renal disease: Secondary | ICD-10-CM | POA: Diagnosis not present

## 2023-03-23 DIAGNOSIS — D631 Anemia in chronic kidney disease: Secondary | ICD-10-CM | POA: Diagnosis not present

## 2023-03-25 DIAGNOSIS — N186 End stage renal disease: Secondary | ICD-10-CM | POA: Diagnosis not present

## 2023-03-25 DIAGNOSIS — I129 Hypertensive chronic kidney disease with stage 1 through stage 4 chronic kidney disease, or unspecified chronic kidney disease: Secondary | ICD-10-CM | POA: Diagnosis not present

## 2023-03-25 DIAGNOSIS — Z992 Dependence on renal dialysis: Secondary | ICD-10-CM | POA: Diagnosis not present

## 2023-03-26 DIAGNOSIS — D631 Anemia in chronic kidney disease: Secondary | ICD-10-CM | POA: Diagnosis not present

## 2023-03-26 DIAGNOSIS — D509 Iron deficiency anemia, unspecified: Secondary | ICD-10-CM | POA: Diagnosis not present

## 2023-03-26 DIAGNOSIS — Z992 Dependence on renal dialysis: Secondary | ICD-10-CM | POA: Diagnosis not present

## 2023-03-26 DIAGNOSIS — N2581 Secondary hyperparathyroidism of renal origin: Secondary | ICD-10-CM | POA: Diagnosis not present

## 2023-03-26 DIAGNOSIS — N186 End stage renal disease: Secondary | ICD-10-CM | POA: Diagnosis not present

## 2023-03-27 DIAGNOSIS — I5032 Chronic diastolic (congestive) heart failure: Secondary | ICD-10-CM | POA: Diagnosis not present

## 2023-03-27 DIAGNOSIS — N186 End stage renal disease: Secondary | ICD-10-CM | POA: Diagnosis not present

## 2023-03-27 DIAGNOSIS — Z992 Dependence on renal dialysis: Secondary | ICD-10-CM | POA: Diagnosis not present

## 2023-03-27 DIAGNOSIS — D631 Anemia in chronic kidney disease: Secondary | ICD-10-CM | POA: Diagnosis not present

## 2023-03-27 DIAGNOSIS — E0590 Thyrotoxicosis, unspecified without thyrotoxic crisis or storm: Secondary | ICD-10-CM | POA: Diagnosis not present

## 2023-03-27 DIAGNOSIS — E1122 Type 2 diabetes mellitus with diabetic chronic kidney disease: Secondary | ICD-10-CM | POA: Diagnosis not present

## 2023-03-28 DIAGNOSIS — D509 Iron deficiency anemia, unspecified: Secondary | ICD-10-CM | POA: Diagnosis not present

## 2023-03-28 DIAGNOSIS — N186 End stage renal disease: Secondary | ICD-10-CM | POA: Diagnosis not present

## 2023-03-28 DIAGNOSIS — D631 Anemia in chronic kidney disease: Secondary | ICD-10-CM | POA: Diagnosis not present

## 2023-03-28 DIAGNOSIS — N2581 Secondary hyperparathyroidism of renal origin: Secondary | ICD-10-CM | POA: Diagnosis not present

## 2023-03-28 DIAGNOSIS — Z992 Dependence on renal dialysis: Secondary | ICD-10-CM | POA: Diagnosis not present

## 2023-03-29 DIAGNOSIS — N186 End stage renal disease: Secondary | ICD-10-CM | POA: Diagnosis not present

## 2023-03-29 DIAGNOSIS — Z992 Dependence on renal dialysis: Secondary | ICD-10-CM | POA: Diagnosis not present

## 2023-03-29 DIAGNOSIS — D509 Iron deficiency anemia, unspecified: Secondary | ICD-10-CM | POA: Diagnosis not present

## 2023-03-29 DIAGNOSIS — D631 Anemia in chronic kidney disease: Secondary | ICD-10-CM | POA: Diagnosis not present

## 2023-03-29 DIAGNOSIS — N2581 Secondary hyperparathyroidism of renal origin: Secondary | ICD-10-CM | POA: Diagnosis not present

## 2023-04-02 DIAGNOSIS — N186 End stage renal disease: Secondary | ICD-10-CM | POA: Diagnosis not present

## 2023-04-02 DIAGNOSIS — N2581 Secondary hyperparathyroidism of renal origin: Secondary | ICD-10-CM | POA: Diagnosis not present

## 2023-04-02 DIAGNOSIS — D509 Iron deficiency anemia, unspecified: Secondary | ICD-10-CM | POA: Diagnosis not present

## 2023-04-02 DIAGNOSIS — Z992 Dependence on renal dialysis: Secondary | ICD-10-CM | POA: Diagnosis not present

## 2023-04-02 DIAGNOSIS — D631 Anemia in chronic kidney disease: Secondary | ICD-10-CM | POA: Diagnosis not present

## 2023-04-04 DIAGNOSIS — Z992 Dependence on renal dialysis: Secondary | ICD-10-CM | POA: Diagnosis not present

## 2023-04-04 DIAGNOSIS — D509 Iron deficiency anemia, unspecified: Secondary | ICD-10-CM | POA: Diagnosis not present

## 2023-04-04 DIAGNOSIS — N186 End stage renal disease: Secondary | ICD-10-CM | POA: Diagnosis not present

## 2023-04-04 DIAGNOSIS — N2581 Secondary hyperparathyroidism of renal origin: Secondary | ICD-10-CM | POA: Diagnosis not present

## 2023-04-04 DIAGNOSIS — D631 Anemia in chronic kidney disease: Secondary | ICD-10-CM | POA: Diagnosis not present

## 2023-04-05 ENCOUNTER — Encounter: Payer: Self-pay | Admitting: Nurse Practitioner

## 2023-04-05 ENCOUNTER — Ambulatory Visit: Payer: Medicare Other | Attending: Nurse Practitioner | Admitting: Nurse Practitioner

## 2023-04-05 VITALS — BP 160/78 | HR 69 | Ht 62.0 in | Wt 109.6 lb

## 2023-04-05 DIAGNOSIS — N184 Chronic kidney disease, stage 4 (severe): Secondary | ICD-10-CM

## 2023-04-05 DIAGNOSIS — R7989 Other specified abnormal findings of blood chemistry: Secondary | ICD-10-CM

## 2023-04-05 DIAGNOSIS — E785 Hyperlipidemia, unspecified: Secondary | ICD-10-CM | POA: Diagnosis not present

## 2023-04-05 DIAGNOSIS — Z87898 Personal history of other specified conditions: Secondary | ICD-10-CM | POA: Diagnosis not present

## 2023-04-05 DIAGNOSIS — E1122 Type 2 diabetes mellitus with diabetic chronic kidney disease: Secondary | ICD-10-CM

## 2023-04-05 DIAGNOSIS — E0590 Thyrotoxicosis, unspecified without thyrotoxic crisis or storm: Secondary | ICD-10-CM

## 2023-04-05 DIAGNOSIS — I10 Essential (primary) hypertension: Secondary | ICD-10-CM

## 2023-04-05 DIAGNOSIS — R0609 Other forms of dyspnea: Secondary | ICD-10-CM | POA: Diagnosis not present

## 2023-04-05 DIAGNOSIS — Z8673 Personal history of transient ischemic attack (TIA), and cerebral infarction without residual deficits: Secondary | ICD-10-CM | POA: Diagnosis not present

## 2023-04-05 DIAGNOSIS — Z720 Tobacco use: Secondary | ICD-10-CM

## 2023-04-05 DIAGNOSIS — N186 End stage renal disease: Secondary | ICD-10-CM | POA: Diagnosis not present

## 2023-04-05 MED ORDER — AMLODIPINE BESYLATE 2.5 MG PO TABS: 2.5000 mg | ORAL_TABLET | Freq: Every day | ORAL | 3 refills | Status: DC

## 2023-04-05 NOTE — Patient Instructions (Signed)
Medication Instructions:  Start Amlodipine 2.5 MG daily *If you need a refill on your cardiac medications before your next appointment, please call your pharmacy*   Lab Work: Your physician recommends that you have lab work drawn today : Lipid Panel LFT   If you have labs (blood work) drawn today and your tests are completely normal, you will receive your results only by: MyChart Message (if you have MyChart) OR A paper copy in the mail If you have any lab test that is abnormal or we need to change your treatment, we will call you to review the results.   Testing/Procedures: None ordered   Follow-Up: At [facility], you and your health needs are our priority.  As part of our continuing mission to provide you with exceptional heart care, we have created designated Provider Care Teams.  These Care Teams include your primary Cardiologist (physician) and Advanced Practice Providers (APPs -  Physician Assistants and Nurse Practitioners) who all work together to provide you with the care you need, when you need it.  We recommend signing up for the patient portal called "MyChart".  Sign up information is provided on this After Visit Summary.  MyChart is used to connect with patients for Virtual Visits (Telemedicine).  Patients are able to view lab/test results, encounter notes, upcoming appointments, etc.  Non-urgent messages can be sent to your provider as well.   To learn more about what you can do with MyChart, go to [email or web address].    Your next appointment:   2-3 month(s)  Provider:   Thurmon Fair, MD     Other Instructions Your physician has requested that you regularly monitor your blood pressure readings at home. Please use the same machine at the same time of day to check your readings. Your goal Blood Pressure reading is under 140-80.

## 2023-04-05 NOTE — Progress Notes (Signed)
Office Visit    Patient Name: Monica Hunt Date of Encounter: 04/05/2023  Primary Care Provider:  Joya Martyr, MD (Inactive) Primary Cardiologist:  Thurmon Fair, MD  Chief Complaint    82 year old female with a history of syncope, hypertension, hyperlipidemia, CVA, type 2 diabetes, ESRD on HD, hyperthyroidism, restless legs syndrome, and GERD who presents for ED follow-up related to chest pain and shortness of breath.   Past Medical History    Past Medical History:  Diagnosis Date   Anemia    Cataracts, bilateral    Chronic kidney disease    ESRD  Dialysis M/W/F   Depression    Diabetes mellitus without complication    GERD (gastroesophageal reflux disease)    Hypertension    Hyperthyroidism    Pneumonia    Stroke    symptom onset ~ 01/10/22, diagnosed 01/24/22 acute/subacute bilateral cerebral infarcts, largest left frontal lobe, extensive punctate chronic microhemorrages concerning for cerbral amyloid angiopathy   Past Surgical History:  Procedure Laterality Date   ABDOMINAL HYSTERECTOMY     vaginal   AV FISTULA PLACEMENT Right 05/02/2022   Procedure: RIGHT ARTERIOVENOUS (AV) FISTULA  CREATION;  Surgeon: Maeola Harman, MD;  Location: [facility] OR;  Service: Vascular;  Laterality: Right;   CARPAL TUNNEL RELEASE Right    COLONOSCOPY WITH PROPOFOL N/A 11/17/2014   Procedure: COLONOSCOPY WITH PROPOFOL;  Surgeon: Charolett Bumpers, MD;  Location: [facility] ENDOSCOPY;  Service: Endoscopy;  Laterality: N/A;   LOOP RECORDER INSERTION N/A 01/25/2022   Procedure: LOOP RECORDER INSERTION;  Surgeon: Regan Lemming, MD;  Location: [facility] INVASIVE CV LAB;  Service: Cardiovascular;  Laterality: N/A;    Allergies  Allergies  Allergen Reactions   Bee Venom Anaphylaxis   Wasp Venom Anaphylaxis     Labs/Other Studies Reviewed    The following studies were reviewed today: Echo 01/2022: IMPRESSIONS     1. Left ventricular ejection fraction, by estimation, is 55 to 60%. The   left ventricle has normal function. The left ventricle has no regional  wall motion abnormalities. There is mild concentric left ventricular  hypertrophy. Left ventricular diastolic  parameters are consistent with Grade I diastolic dysfunction (impaired  relaxation).   2. Right ventricular systolic function is normal. The right ventricular  size is normal. There is normal pulmonary artery systolic pressure.   3. The mitral valve is normal in structure. Trivial mitral valve  regurgitation. No evidence of mitral stenosis.   4. Tricuspid valve regurgitation is mild to moderate.   5. The aortic valve is normal in structure. Aortic valve regurgitation is  not visualized. Aortic valve sclerosis/calcification is present, without  any evidence of aortic stenosis.   6. The inferior vena cava is normal in size with greater than 50%  respiratory variability, suggesting right atrial pressure of 3 mmHg.   Comparison(s): No significant change from prior study.   [place] 12/2021: The dominant rhythm was normal sinus with normal circadian variation. There are no meaningful episodes of bradycardia or pauses. There is no atrial fibrillation. Multiple episodes of nonsustained as well as sustained but brief supraventricular tachycardia (probably ectopic atrial tachycardia) are seen. The longest episode lasted for 18 seconds. These events occur both during daytime hours and the expected sleep hours. Also seen are episodes of brief wide-complex tachycardia. While many of these represent SVT with aberrant conduction, some are true episodes of nonsustained VT lasting for 6-7 beats. Relation between the patient's symptoms and the episodes of supraventricular tachycardia.   Abnormal  event monitor due to the occurrence of relatively frequent episodes of nonsustained and briefly sustained ectopic atrial tachycardia which seem to correlate with the patient's symptoms.  The longest episode duration was 18 seconds. Atrial  fibrillation was not recorded. Also noted are 2 very brief episodes of nonsustained ventricular tachycardia, maximum 7 beats.     Patch Wear Time:  12 days and 16 hours (2022-12-27T05:24:14-0500 to 2023-01-08T21:46:31-0500)   Patient had a min HR of 48 bpm, max HR of 231 bpm, and avg HR of 93 bpm. Predominant underlying rhythm was Sinus Rhythm. 2 Ventricular Tachycardia runs occurred, the run with the fastest interval lasting 7 beats with a max rate of 231 bpm, the longest  lasting 6 beats with an avg rate of 161 bpm. Some episodes of Ventricular Tachycardia conducted with possible aberrancy. 33 Supraventricular Tachycardia runs occurred, the run with the fastest interval lasting 13 beats with a max rate of 226 bpm, the  longest lasting 18.2 secs with an avg rate of 134 bpm. Isolated SVEs were occasional (1.0%, 15902), SVE Couplets were rare (<1.0%, 587), and SVE Triplets were rare (<1.0%, 45). Isolated VEs were rare (<1.0%), and no VE Couplets or VE Triplets were  present. Inverted QRS complexes possibly due to inverted placement of device.   ETT 12/2018: Blood pressure demonstrated a normal response to exercise. There was no ST segment deviation noted during stress.   Normal ETT Rare PVCls with stress and in recovery Patient only achieved 87% of PMHR   Recent Labs: 03/17/2023: BUN 31; Creatinine, Ser 5.56; Hemoglobin 9.7; Platelets 182; Potassium 4.1; Sodium 139  Recent Lipid Panel    Component Value Date/Time   CHOL 126 01/25/2022 0650   TRIG 217 (H) 01/25/2022 0650   HDL 30 (L) 01/25/2022 0650   CHOLHDL 4.2 01/25/2022 0650   VLDL 43 (H) 01/25/2022 0650   LDLCALC 53 01/25/2022 0650    History of Present Illness    82 year old female with the above past medical history including syncope, hypertension, hyperlipidemia, CVA, type 2 diabetes, ESRD on HD, hyperthyroidism, restless legs syndrome, and GERD.  She has a history of syncopal episodes in the past however, this resolved  following reduction in dose of her beta-blocker.  Previous cardiac monitor showed persistent bradycardia.  ETT in 2020 was negative for ischemia. She was last seen in the office on 12/13/2021 and was stable from a cardiac standpoint.  She denies any recurrent syncope at the time. She was hospitalized in 01/2022 in the setting of stroke.  She underwent loop recorder insertion.  Echocardiogram at that time showed EF 55 to 60%, mild concentric LVH, G1 DD, normal RV systolic function, no significant valvular abnormalities.  Carotid dopplers revealed 1 to 39% B ICA stenosis.  She was last seen in the office on 11/14/2022 and was stable overall from a cardiac standpoint.  She noted generalized fatigue, denied any recurrent syncope.  She presented to the ED on 03/17/2023 with complaints of shortness of breath, worse with exertion.  She was seen at [facility] approximately 2 weeks prior to this with similar symptoms.  She was dialyzed and went home.  Chest x-ray showed pulmonary edema, troponin was mildly elevated 49>>52.  She was discharged home and advised to proceed with her scheduled dialysis.   She presents today for follow-up.  Since her last visit she has been stable from a cardiac standpoint.  She has noticed a significant improvement in her shortness of breath and orthopnea with increased volume removal at dialysis.  She denies any chest pain, edema, PND, orthopnea, palpitations, dizziness, presyncope or syncope.  She notes her BP has been persistently elevated in the 160s-170s/80s.   Home Medications    Current Outpatient Medications  Medication Sig Dispense Refill   ACCU-CHEK SMARTVIEW test strip      acetaminophen (TYLENOL) 500 MG tablet Take 1,000 mg by mouth every 6 (six) hours as needed for mild pain.     amLODipine (NORVASC) 2.5 MG tablet Take 1 tablet (2.5 mg total) by mouth daily. 90 tablet 3   aspirin EC 81 MG tablet Take 1 tablet (81 mg total) by mouth daily. Swallow whole. 30 tablet 11    atorvastatin (LIPITOR) 40 MG tablet Take 40 mg by mouth daily.     betamethasone valerate (VALISONE) 0.1 % cream Apply 1 application  topically 2 (two) times daily as needed (poison oak).     gentamicin cream (GARAMYCIN) 0.1 % Apply 1 application  topically daily as needed (skin irritation around tube).     methimazole (TAPAZOLE) 5 MG tablet Take 7.5 mg by mouth daily.     midodrine (PROAMATINE) 5 MG tablet Take 5 mg by mouth 3 (three) times daily as needed (if BP is low).     Multiple Minerals-Vitamins (CAL-MAG-ZINC-D) TABS Take 1 tablet by mouth daily.     Multiple Vitamin (MULTIVITAMIN WITH MINERALS) TABS tablet Take 1 tablet by mouth daily.     omeprazole (PRILOSEC) 40 MG capsule Take 40 mg by mouth daily.     rOPINIRole (REQUIP) 3 MG tablet Take 3 mg by mouth at bedtime. Take 1 tab 1 to 3 hours before bedtime     traZODone (DESYREL) 50 MG tablet Take 100 mg by mouth at bedtime.     benzonatate (TESSALON) 100 MG capsule Take 1 capsule (100 mg total) by mouth every 8 (eight) hours. (Patient not taking: Reported on 04/05/2023) 21 capsule 0   EPINEPHrine 0.3 mg/0.3 mL IJ SOAJ injection Inject 0.3 mg into the muscle once. (Patient not taking: Reported on 04/05/2023)     glipiZIDE (GLUCOTROL XL) 2.5 MG 24 hr tablet Take 2.5 mg by mouth daily as needed (high blood sugar). (Patient not taking: Reported on 04/05/2023)     No current facility-administered medications for this visit.     Review of Systems    She denies chest pain, palpitations, dyspnea, pnd, orthopnea, n, v, dizziness, syncope, edema, weight gain, or early satiety. All other systems reviewed and are otherwise negative except as noted above.   Physical Exam    VS:  BP (!) 160/78   Pulse 69   Ht 5\' 2"  (1.575 m)   Wt 109 lb 9.6 oz (49.7 kg)   SpO2 99%   BMI 20.05 kg/m   GEN: Well nourished, well developed, in no acute distress. HEENT: normal. Neck: Supple, no JVD, carotid bruits, or masses. Cardiac: RRR, no murmurs, rubs, or  gallops. No clubbing, cyanosis, edema.  Radials/DP/PT 2+ and equal bilaterally.  Respiratory:  Respirations regular and unlabored, clear to auscultation bilaterally. GI: Soft, nontender, nondistended, BS + x 4. MS: no deformity or atrophy. Skin: warm and dry, no rash. Neuro:  Strength and sensation are intact. Psych: Normal affect.  Accessory Clinical Findings    ECG personally reviewed by me today -sinus rhythm, 69 bpm, PACs, incomplete RBBB- no acute changes.   Lab Results  Component Value Date   WBC 6.3 03/17/2023   HGB 9.7 (L) 03/17/2023   HCT 29.9 (L) 03/17/2023   MCV 90.1  03/17/2023   PLT 182 03/17/2023   Lab Results  Component Value Date   CREATININE 5.56 (H) 03/17/2023   BUN 31 (H) 03/17/2023   NA 139 03/17/2023   K 4.1 03/17/2023   CL 98 03/17/2023   CO2 28 03/17/2023   Lab Results  Component Value Date   ALT 24 01/24/2022   AST 24 01/24/2022   ALKPHOS 106 01/24/2022   BILITOT 0.5 01/24/2022   Lab Results  Component Value Date   CHOL 126 01/25/2022   HDL 30 (L) 01/25/2022   LDLCALC 53 01/25/2022   TRIG 217 (H) 01/25/2022   CHOLHDL 4.2 01/25/2022    Lab Results  Component Value Date   HGBA1C 4.9 01/25/2022    Assessment & Plan    1.  Dyspnea on exertion/elevated troponin: Recent ED visit in the setting shortness of breath, orthopnea, elevated troponin. Her symptoms improved with increased dialysis.  She denies any chest pain, PND, orthopnea, weight gain. Echo in 01/2022 showed EF 55 to 60%, mild concentric LVH, G1 DD, normal RV systolic function, no significant valvular abnormalities. We discussed possible ischemic evaluation with cardiac PET stress test, however, given improvement in symptoms, patient prefers to wait at this time.  Discussed ongoing monitoring of symptoms, ED precautions. Fluid volume management per HD.   2. History of syncope: Resolved following discontinuation of beta-blocker therapy.  Prior cardiac monitor showed persistent bradycardia.   ETT in 2020 negative for ischemia.  Most recent echo in 01/2022 was normal.  Carotid Doppler showed 1 to 39% B ICA stenosis.  Denies any recurrent syncope.  Stable.   3. Hypertension: BP has been persistently elevated recently with SBP in the 160s-170s. She does have a prescription for prn midodrine as she tends to have lower blood pressures.  Previously on amlodipine.  Will reintroduce low-dose amlodipine 2.5 mg daily for goal BP 140/80.  I advised her to report persistently low blood pressures, dizziness, presyncope, or syncope.   4. Hyperlipidemia: LDL was 53 in 01/2022. Will repeat fasting lipid panel, LFTs today.  Continue Lipitor.    5. History of CVA: Cryptogenic, s/p loop recorder.  Continue aspirin, Lipitor.   6. Type 2 diabetes: A1c was 5.7 in 12/2022.  Monitored and managed per PCP.   7. ESRD: On HD.  Follows with nephrology.   8. Hyperthyroidism: TSH was 0.06 in 03/2023.  Stable.   9. Tobacco use: She continues to smoke.  Full cessation advised.   10. Disposition: Follow-up in 2 to 3 months.  HYPERTENSION CONTROL Vitals:   04/05/23 0855 04/05/23 0930  BP: (!) 162/68 (!) 160/78    The patient's blood pressure is elevated above target today.  In order to address the patient's elevated BP: Blood pressure will be monitored at home to determine if medication changes need to be made.; A new medication was prescribed today.; Follow up with general cardiology has been recommended.      Joylene Grapes, NP 04/05/2023, 12:04 PM

## 2023-04-06 DIAGNOSIS — D509 Iron deficiency anemia, unspecified: Secondary | ICD-10-CM | POA: Diagnosis not present

## 2023-04-06 DIAGNOSIS — D631 Anemia in chronic kidney disease: Secondary | ICD-10-CM | POA: Diagnosis not present

## 2023-04-06 DIAGNOSIS — N2581 Secondary hyperparathyroidism of renal origin: Secondary | ICD-10-CM | POA: Diagnosis not present

## 2023-04-06 DIAGNOSIS — Z992 Dependence on renal dialysis: Secondary | ICD-10-CM | POA: Diagnosis not present

## 2023-04-06 DIAGNOSIS — N186 End stage renal disease: Secondary | ICD-10-CM | POA: Diagnosis not present

## 2023-04-06 LAB — LIPID PANEL
Chol/HDL Ratio: 3.1 ratio (ref 0.0–4.4)
Cholesterol, Total: 160 mg/dL (ref 100–199)
HDL: 51 mg/dL (ref >39)
LDL Chol Calc (NIH): 92 mg/dL (ref 0–99)
Triglycerides: 89 mg/dL (ref 0–149)
VLDL Cholesterol Cal: 17 mg/dL (ref 5–40)

## 2023-04-06 LAB — HEPATIC FUNCTION PANEL
ALT: 33 IU/L — ABNORMAL HIGH (ref 0–32)
AST: 28 IU/L (ref 0–40)
Albumin: 4.3 g/dL (ref 3.8–4.8)
Alkaline Phosphatase: 254 IU/L — ABNORMAL HIGH (ref 44–121)
Bilirubin Total: 0.4 mg/dL (ref 0.0–1.2)
Bilirubin, Direct: 0.17 mg/dL (ref 0.00–0.40)
Total Protein: 6.6 g/dL (ref 6.0–8.5)

## 2023-04-09 ENCOUNTER — Ambulatory Visit (INDEPENDENT_AMBULATORY_CARE_PROVIDER_SITE_OTHER): Payer: Medicare Other

## 2023-04-09 DIAGNOSIS — Z8673 Personal history of transient ischemic attack (TIA), and cerebral infarction without residual deficits: Secondary | ICD-10-CM

## 2023-04-09 DIAGNOSIS — N186 End stage renal disease: Secondary | ICD-10-CM | POA: Diagnosis not present

## 2023-04-09 DIAGNOSIS — D631 Anemia in chronic kidney disease: Secondary | ICD-10-CM | POA: Diagnosis not present

## 2023-04-09 DIAGNOSIS — N2581 Secondary hyperparathyroidism of renal origin: Secondary | ICD-10-CM | POA: Diagnosis not present

## 2023-04-09 DIAGNOSIS — Z992 Dependence on renal dialysis: Secondary | ICD-10-CM | POA: Diagnosis not present

## 2023-04-09 DIAGNOSIS — D509 Iron deficiency anemia, unspecified: Secondary | ICD-10-CM | POA: Diagnosis not present

## 2023-04-10 LAB — CUP PACEART REMOTE DEVICE CHECK
Date Time Interrogation Session: 20240415000800
Implantable Pulse Generator Implant Date: 20230201

## 2023-04-11 ENCOUNTER — Telehealth: Payer: Self-pay

## 2023-04-11 DIAGNOSIS — N186 End stage renal disease: Secondary | ICD-10-CM | POA: Diagnosis not present

## 2023-04-11 DIAGNOSIS — D509 Iron deficiency anemia, unspecified: Secondary | ICD-10-CM | POA: Diagnosis not present

## 2023-04-11 DIAGNOSIS — N2581 Secondary hyperparathyroidism of renal origin: Secondary | ICD-10-CM | POA: Diagnosis not present

## 2023-04-11 DIAGNOSIS — Z992 Dependence on renal dialysis: Secondary | ICD-10-CM | POA: Diagnosis not present

## 2023-04-11 DIAGNOSIS — D631 Anemia in chronic kidney disease: Secondary | ICD-10-CM | POA: Diagnosis not present

## 2023-04-11 NOTE — Telephone Encounter (Signed)
Lmom to discuss lab results. Waiting on a return call.  

## 2023-04-13 DIAGNOSIS — N186 End stage renal disease: Secondary | ICD-10-CM | POA: Diagnosis not present

## 2023-04-13 DIAGNOSIS — D631 Anemia in chronic kidney disease: Secondary | ICD-10-CM | POA: Diagnosis not present

## 2023-04-13 DIAGNOSIS — N2581 Secondary hyperparathyroidism of renal origin: Secondary | ICD-10-CM | POA: Diagnosis not present

## 2023-04-13 DIAGNOSIS — Z992 Dependence on renal dialysis: Secondary | ICD-10-CM | POA: Diagnosis not present

## 2023-04-13 DIAGNOSIS — D509 Iron deficiency anemia, unspecified: Secondary | ICD-10-CM | POA: Diagnosis not present

## 2023-04-16 DIAGNOSIS — Z992 Dependence on renal dialysis: Secondary | ICD-10-CM | POA: Diagnosis not present

## 2023-04-16 DIAGNOSIS — N186 End stage renal disease: Secondary | ICD-10-CM | POA: Diagnosis not present

## 2023-04-16 DIAGNOSIS — D509 Iron deficiency anemia, unspecified: Secondary | ICD-10-CM | POA: Diagnosis not present

## 2023-04-16 DIAGNOSIS — D631 Anemia in chronic kidney disease: Secondary | ICD-10-CM | POA: Diagnosis not present

## 2023-04-16 DIAGNOSIS — N2581 Secondary hyperparathyroidism of renal origin: Secondary | ICD-10-CM | POA: Diagnosis not present

## 2023-04-16 NOTE — Progress Notes (Signed)
[facility] Summary Report / Loop Recorder 

## 2023-04-18 DIAGNOSIS — D509 Iron deficiency anemia, unspecified: Secondary | ICD-10-CM | POA: Diagnosis not present

## 2023-04-18 DIAGNOSIS — N186 End stage renal disease: Secondary | ICD-10-CM | POA: Diagnosis not present

## 2023-04-18 DIAGNOSIS — Z992 Dependence on renal dialysis: Secondary | ICD-10-CM | POA: Diagnosis not present

## 2023-04-18 DIAGNOSIS — D631 Anemia in chronic kidney disease: Secondary | ICD-10-CM | POA: Diagnosis not present

## 2023-04-18 DIAGNOSIS — N2581 Secondary hyperparathyroidism of renal origin: Secondary | ICD-10-CM | POA: Diagnosis not present

## 2023-04-20 DIAGNOSIS — N2581 Secondary hyperparathyroidism of renal origin: Secondary | ICD-10-CM | POA: Diagnosis not present

## 2023-04-20 DIAGNOSIS — N186 End stage renal disease: Secondary | ICD-10-CM | POA: Diagnosis not present

## 2023-04-20 DIAGNOSIS — Z992 Dependence on renal dialysis: Secondary | ICD-10-CM | POA: Diagnosis not present

## 2023-04-20 DIAGNOSIS — D631 Anemia in chronic kidney disease: Secondary | ICD-10-CM | POA: Diagnosis not present

## 2023-04-20 DIAGNOSIS — D509 Iron deficiency anemia, unspecified: Secondary | ICD-10-CM | POA: Diagnosis not present

## 2023-04-23 DIAGNOSIS — Z992 Dependence on renal dialysis: Secondary | ICD-10-CM | POA: Diagnosis not present

## 2023-04-23 DIAGNOSIS — N186 End stage renal disease: Secondary | ICD-10-CM | POA: Diagnosis not present

## 2023-04-23 DIAGNOSIS — D631 Anemia in chronic kidney disease: Secondary | ICD-10-CM | POA: Diagnosis not present

## 2023-04-23 DIAGNOSIS — D509 Iron deficiency anemia, unspecified: Secondary | ICD-10-CM | POA: Diagnosis not present

## 2023-04-23 DIAGNOSIS — N2581 Secondary hyperparathyroidism of renal origin: Secondary | ICD-10-CM | POA: Diagnosis not present

## 2023-04-24 DIAGNOSIS — I129 Hypertensive chronic kidney disease with stage 1 through stage 4 chronic kidney disease, or unspecified chronic kidney disease: Secondary | ICD-10-CM | POA: Diagnosis not present

## 2023-04-24 DIAGNOSIS — N186 End stage renal disease: Secondary | ICD-10-CM | POA: Diagnosis not present

## 2023-04-24 DIAGNOSIS — Z992 Dependence on renal dialysis: Secondary | ICD-10-CM | POA: Diagnosis not present

## 2023-04-25 DIAGNOSIS — N2581 Secondary hyperparathyroidism of renal origin: Secondary | ICD-10-CM | POA: Diagnosis not present

## 2023-04-25 DIAGNOSIS — E1122 Type 2 diabetes mellitus with diabetic chronic kidney disease: Secondary | ICD-10-CM | POA: Diagnosis not present

## 2023-04-25 DIAGNOSIS — Z992 Dependence on renal dialysis: Secondary | ICD-10-CM | POA: Diagnosis not present

## 2023-04-25 DIAGNOSIS — N186 End stage renal disease: Secondary | ICD-10-CM | POA: Diagnosis not present

## 2023-04-25 DIAGNOSIS — D509 Iron deficiency anemia, unspecified: Secondary | ICD-10-CM | POA: Diagnosis not present

## 2023-04-25 DIAGNOSIS — D631 Anemia in chronic kidney disease: Secondary | ICD-10-CM | POA: Diagnosis not present

## 2023-04-27 DIAGNOSIS — D631 Anemia in chronic kidney disease: Secondary | ICD-10-CM | POA: Diagnosis not present

## 2023-04-27 DIAGNOSIS — N2581 Secondary hyperparathyroidism of renal origin: Secondary | ICD-10-CM | POA: Diagnosis not present

## 2023-04-27 DIAGNOSIS — E1122 Type 2 diabetes mellitus with diabetic chronic kidney disease: Secondary | ICD-10-CM | POA: Diagnosis not present

## 2023-04-27 DIAGNOSIS — N186 End stage renal disease: Secondary | ICD-10-CM | POA: Diagnosis not present

## 2023-04-27 DIAGNOSIS — D509 Iron deficiency anemia, unspecified: Secondary | ICD-10-CM | POA: Diagnosis not present

## 2023-04-27 DIAGNOSIS — Z992 Dependence on renal dialysis: Secondary | ICD-10-CM | POA: Diagnosis not present

## 2023-04-30 DIAGNOSIS — N186 End stage renal disease: Secondary | ICD-10-CM | POA: Diagnosis not present

## 2023-04-30 DIAGNOSIS — D631 Anemia in chronic kidney disease: Secondary | ICD-10-CM | POA: Diagnosis not present

## 2023-04-30 DIAGNOSIS — Z992 Dependence on renal dialysis: Secondary | ICD-10-CM | POA: Diagnosis not present

## 2023-04-30 DIAGNOSIS — E1122 Type 2 diabetes mellitus with diabetic chronic kidney disease: Secondary | ICD-10-CM | POA: Diagnosis not present

## 2023-04-30 DIAGNOSIS — D509 Iron deficiency anemia, unspecified: Secondary | ICD-10-CM | POA: Diagnosis not present

## 2023-04-30 DIAGNOSIS — N2581 Secondary hyperparathyroidism of renal origin: Secondary | ICD-10-CM | POA: Diagnosis not present

## 2023-05-02 DIAGNOSIS — N186 End stage renal disease: Secondary | ICD-10-CM | POA: Diagnosis not present

## 2023-05-02 DIAGNOSIS — Z992 Dependence on renal dialysis: Secondary | ICD-10-CM | POA: Diagnosis not present

## 2023-05-02 DIAGNOSIS — N2581 Secondary hyperparathyroidism of renal origin: Secondary | ICD-10-CM | POA: Diagnosis not present

## 2023-05-02 DIAGNOSIS — E1122 Type 2 diabetes mellitus with diabetic chronic kidney disease: Secondary | ICD-10-CM | POA: Diagnosis not present

## 2023-05-02 DIAGNOSIS — D631 Anemia in chronic kidney disease: Secondary | ICD-10-CM | POA: Diagnosis not present

## 2023-05-02 DIAGNOSIS — D509 Iron deficiency anemia, unspecified: Secondary | ICD-10-CM | POA: Diagnosis not present

## 2023-05-04 DIAGNOSIS — N2581 Secondary hyperparathyroidism of renal origin: Secondary | ICD-10-CM | POA: Diagnosis not present

## 2023-05-04 DIAGNOSIS — E1122 Type 2 diabetes mellitus with diabetic chronic kidney disease: Secondary | ICD-10-CM | POA: Diagnosis not present

## 2023-05-04 DIAGNOSIS — D509 Iron deficiency anemia, unspecified: Secondary | ICD-10-CM | POA: Diagnosis not present

## 2023-05-04 DIAGNOSIS — Z992 Dependence on renal dialysis: Secondary | ICD-10-CM | POA: Diagnosis not present

## 2023-05-04 DIAGNOSIS — N186 End stage renal disease: Secondary | ICD-10-CM | POA: Diagnosis not present

## 2023-05-04 DIAGNOSIS — D631 Anemia in chronic kidney disease: Secondary | ICD-10-CM | POA: Diagnosis not present

## 2023-05-07 DIAGNOSIS — E1122 Type 2 diabetes mellitus with diabetic chronic kidney disease: Secondary | ICD-10-CM | POA: Diagnosis not present

## 2023-05-07 DIAGNOSIS — N2581 Secondary hyperparathyroidism of renal origin: Secondary | ICD-10-CM | POA: Diagnosis not present

## 2023-05-07 DIAGNOSIS — D631 Anemia in chronic kidney disease: Secondary | ICD-10-CM | POA: Diagnosis not present

## 2023-05-07 DIAGNOSIS — D509 Iron deficiency anemia, unspecified: Secondary | ICD-10-CM | POA: Diagnosis not present

## 2023-05-07 DIAGNOSIS — Z992 Dependence on renal dialysis: Secondary | ICD-10-CM | POA: Diagnosis not present

## 2023-05-07 DIAGNOSIS — N186 End stage renal disease: Secondary | ICD-10-CM | POA: Diagnosis not present

## 2023-05-09 DIAGNOSIS — Z992 Dependence on renal dialysis: Secondary | ICD-10-CM | POA: Diagnosis not present

## 2023-05-09 DIAGNOSIS — D509 Iron deficiency anemia, unspecified: Secondary | ICD-10-CM | POA: Diagnosis not present

## 2023-05-09 DIAGNOSIS — N186 End stage renal disease: Secondary | ICD-10-CM | POA: Diagnosis not present

## 2023-05-09 DIAGNOSIS — E1122 Type 2 diabetes mellitus with diabetic chronic kidney disease: Secondary | ICD-10-CM | POA: Diagnosis not present

## 2023-05-09 DIAGNOSIS — N2581 Secondary hyperparathyroidism of renal origin: Secondary | ICD-10-CM | POA: Diagnosis not present

## 2023-05-09 DIAGNOSIS — D631 Anemia in chronic kidney disease: Secondary | ICD-10-CM | POA: Diagnosis not present

## 2023-05-11 DIAGNOSIS — E1122 Type 2 diabetes mellitus with diabetic chronic kidney disease: Secondary | ICD-10-CM | POA: Diagnosis not present

## 2023-05-11 DIAGNOSIS — Z992 Dependence on renal dialysis: Secondary | ICD-10-CM | POA: Diagnosis not present

## 2023-05-11 DIAGNOSIS — N2581 Secondary hyperparathyroidism of renal origin: Secondary | ICD-10-CM | POA: Diagnosis not present

## 2023-05-11 DIAGNOSIS — N186 End stage renal disease: Secondary | ICD-10-CM | POA: Diagnosis not present

## 2023-05-11 DIAGNOSIS — D631 Anemia in chronic kidney disease: Secondary | ICD-10-CM | POA: Diagnosis not present

## 2023-05-11 DIAGNOSIS — D509 Iron deficiency anemia, unspecified: Secondary | ICD-10-CM | POA: Diagnosis not present

## 2023-05-14 ENCOUNTER — Ambulatory Visit (INDEPENDENT_AMBULATORY_CARE_PROVIDER_SITE_OTHER): Payer: Medicare Other

## 2023-05-14 DIAGNOSIS — D509 Iron deficiency anemia, unspecified: Secondary | ICD-10-CM | POA: Diagnosis not present

## 2023-05-14 DIAGNOSIS — N186 End stage renal disease: Secondary | ICD-10-CM | POA: Diagnosis not present

## 2023-05-14 DIAGNOSIS — D631 Anemia in chronic kidney disease: Secondary | ICD-10-CM | POA: Diagnosis not present

## 2023-05-14 DIAGNOSIS — I639 Cerebral infarction, unspecified: Secondary | ICD-10-CM | POA: Diagnosis not present

## 2023-05-14 DIAGNOSIS — N2581 Secondary hyperparathyroidism of renal origin: Secondary | ICD-10-CM | POA: Diagnosis not present

## 2023-05-14 DIAGNOSIS — E1122 Type 2 diabetes mellitus with diabetic chronic kidney disease: Secondary | ICD-10-CM | POA: Diagnosis not present

## 2023-05-14 DIAGNOSIS — Z992 Dependence on renal dialysis: Secondary | ICD-10-CM | POA: Diagnosis not present

## 2023-05-14 LAB — CUP PACEART REMOTE DEVICE CHECK
Date Time Interrogation Session: 20240518000147
Implantable Pulse Generator Implant Date: 20230201

## 2023-05-15 NOTE — Progress Notes (Signed)
[facility] Summary Report / Loop Recorder 

## 2023-05-16 DIAGNOSIS — E1122 Type 2 diabetes mellitus with diabetic chronic kidney disease: Secondary | ICD-10-CM | POA: Diagnosis not present

## 2023-05-16 DIAGNOSIS — N2581 Secondary hyperparathyroidism of renal origin: Secondary | ICD-10-CM | POA: Diagnosis not present

## 2023-05-16 DIAGNOSIS — D631 Anemia in chronic kidney disease: Secondary | ICD-10-CM | POA: Diagnosis not present

## 2023-05-16 DIAGNOSIS — D509 Iron deficiency anemia, unspecified: Secondary | ICD-10-CM | POA: Diagnosis not present

## 2023-05-16 DIAGNOSIS — Z992 Dependence on renal dialysis: Secondary | ICD-10-CM | POA: Diagnosis not present

## 2023-05-16 DIAGNOSIS — N186 End stage renal disease: Secondary | ICD-10-CM | POA: Diagnosis not present

## 2023-05-18 ENCOUNTER — Telehealth: Payer: Self-pay

## 2023-05-18 DIAGNOSIS — E7800 Pure hypercholesterolemia, unspecified: Secondary | ICD-10-CM

## 2023-05-18 DIAGNOSIS — Z992 Dependence on renal dialysis: Secondary | ICD-10-CM | POA: Diagnosis not present

## 2023-05-18 DIAGNOSIS — D631 Anemia in chronic kidney disease: Secondary | ICD-10-CM | POA: Diagnosis not present

## 2023-05-18 DIAGNOSIS — N186 End stage renal disease: Secondary | ICD-10-CM | POA: Diagnosis not present

## 2023-05-18 DIAGNOSIS — D509 Iron deficiency anemia, unspecified: Secondary | ICD-10-CM | POA: Diagnosis not present

## 2023-05-18 DIAGNOSIS — I10 Essential (primary) hypertension: Secondary | ICD-10-CM

## 2023-05-18 DIAGNOSIS — N2581 Secondary hyperparathyroidism of renal origin: Secondary | ICD-10-CM | POA: Diagnosis not present

## 2023-05-18 DIAGNOSIS — E1122 Type 2 diabetes mellitus with diabetic chronic kidney disease: Secondary | ICD-10-CM | POA: Diagnosis not present

## 2023-05-18 NOTE — Telephone Encounter (Signed)
Lmom on home & cell numbers to discuss lab work. Waiting on a return call.

## 2023-05-18 NOTE — Telephone Encounter (Signed)
Pt returned call to discuss lab results. Pt voiced understanding. Pt was not fasting when lab work was completed. New orders have been placed. Pt will come in next week for repeat labs.

## 2023-05-21 DIAGNOSIS — D509 Iron deficiency anemia, unspecified: Secondary | ICD-10-CM | POA: Diagnosis not present

## 2023-05-21 DIAGNOSIS — E1122 Type 2 diabetes mellitus with diabetic chronic kidney disease: Secondary | ICD-10-CM | POA: Diagnosis not present

## 2023-05-21 DIAGNOSIS — N2581 Secondary hyperparathyroidism of renal origin: Secondary | ICD-10-CM | POA: Diagnosis not present

## 2023-05-21 DIAGNOSIS — N186 End stage renal disease: Secondary | ICD-10-CM | POA: Diagnosis not present

## 2023-05-21 DIAGNOSIS — Z992 Dependence on renal dialysis: Secondary | ICD-10-CM | POA: Diagnosis not present

## 2023-05-21 DIAGNOSIS — D631 Anemia in chronic kidney disease: Secondary | ICD-10-CM | POA: Diagnosis not present

## 2023-05-23 DIAGNOSIS — D509 Iron deficiency anemia, unspecified: Secondary | ICD-10-CM | POA: Diagnosis not present

## 2023-05-23 DIAGNOSIS — Z992 Dependence on renal dialysis: Secondary | ICD-10-CM | POA: Diagnosis not present

## 2023-05-23 DIAGNOSIS — E1122 Type 2 diabetes mellitus with diabetic chronic kidney disease: Secondary | ICD-10-CM | POA: Diagnosis not present

## 2023-05-23 DIAGNOSIS — D631 Anemia in chronic kidney disease: Secondary | ICD-10-CM | POA: Diagnosis not present

## 2023-05-23 DIAGNOSIS — N186 End stage renal disease: Secondary | ICD-10-CM | POA: Diagnosis not present

## 2023-05-23 DIAGNOSIS — N2581 Secondary hyperparathyroidism of renal origin: Secondary | ICD-10-CM | POA: Diagnosis not present

## 2023-05-25 DIAGNOSIS — I5032 Chronic diastolic (congestive) heart failure: Secondary | ICD-10-CM | POA: Diagnosis not present

## 2023-05-25 DIAGNOSIS — E782 Mixed hyperlipidemia: Secondary | ICD-10-CM | POA: Diagnosis not present

## 2023-05-25 DIAGNOSIS — N186 End stage renal disease: Secondary | ICD-10-CM | POA: Diagnosis not present

## 2023-05-25 DIAGNOSIS — G4700 Insomnia, unspecified: Secondary | ICD-10-CM | POA: Diagnosis not present

## 2023-05-25 DIAGNOSIS — I10 Essential (primary) hypertension: Secondary | ICD-10-CM | POA: Diagnosis not present

## 2023-05-25 DIAGNOSIS — D631 Anemia in chronic kidney disease: Secondary | ICD-10-CM | POA: Diagnosis not present

## 2023-05-25 DIAGNOSIS — N2581 Secondary hyperparathyroidism of renal origin: Secondary | ICD-10-CM | POA: Diagnosis not present

## 2023-05-25 DIAGNOSIS — M8580 Other specified disorders of bone density and structure, unspecified site: Secondary | ICD-10-CM | POA: Diagnosis not present

## 2023-05-25 DIAGNOSIS — K219 Gastro-esophageal reflux disease without esophagitis: Secondary | ICD-10-CM | POA: Diagnosis not present

## 2023-05-25 DIAGNOSIS — I129 Hypertensive chronic kidney disease with stage 1 through stage 4 chronic kidney disease, or unspecified chronic kidney disease: Secondary | ICD-10-CM | POA: Diagnosis not present

## 2023-05-25 DIAGNOSIS — D509 Iron deficiency anemia, unspecified: Secondary | ICD-10-CM | POA: Diagnosis not present

## 2023-05-25 DIAGNOSIS — E1122 Type 2 diabetes mellitus with diabetic chronic kidney disease: Secondary | ICD-10-CM | POA: Diagnosis not present

## 2023-05-25 DIAGNOSIS — Z992 Dependence on renal dialysis: Secondary | ICD-10-CM | POA: Diagnosis not present

## 2023-05-28 DIAGNOSIS — Z992 Dependence on renal dialysis: Secondary | ICD-10-CM | POA: Diagnosis not present

## 2023-05-28 DIAGNOSIS — N2581 Secondary hyperparathyroidism of renal origin: Secondary | ICD-10-CM | POA: Diagnosis not present

## 2023-05-28 DIAGNOSIS — D509 Iron deficiency anemia, unspecified: Secondary | ICD-10-CM | POA: Diagnosis not present

## 2023-05-28 DIAGNOSIS — D631 Anemia in chronic kidney disease: Secondary | ICD-10-CM | POA: Diagnosis not present

## 2023-05-28 DIAGNOSIS — N186 End stage renal disease: Secondary | ICD-10-CM | POA: Diagnosis not present

## 2023-05-29 DIAGNOSIS — E7800 Pure hypercholesterolemia, unspecified: Secondary | ICD-10-CM | POA: Diagnosis not present

## 2023-05-29 DIAGNOSIS — I10 Essential (primary) hypertension: Secondary | ICD-10-CM | POA: Diagnosis not present

## 2023-05-29 LAB — LIPID PANEL
Chol/HDL Ratio: 3.7 ratio (ref 0.0–4.4)
Cholesterol, Total: 146 mg/dL (ref 100–199)
HDL: 39 mg/dL — ABNORMAL LOW (ref >39)
LDL Chol Calc (NIH): 79 mg/dL (ref 0–99)
Triglycerides: 161 mg/dL — ABNORMAL HIGH (ref 0–149)
VLDL Cholesterol Cal: 28 mg/dL (ref 5–40)

## 2023-05-29 LAB — HEPATIC FUNCTION PANEL
ALT: 15 IU/L (ref 0–32)
AST: 17 IU/L (ref 0–40)
Albumin: 4.4 g/dL (ref 3.7–4.7)
Alkaline Phosphatase: 256 IU/L — ABNORMAL HIGH (ref 44–121)
Bilirubin Total: 0.4 mg/dL (ref 0.0–1.2)
Bilirubin, Direct: 0.15 mg/dL (ref 0.00–0.40)
Total Protein: 7 g/dL (ref 6.0–8.5)

## 2023-05-30 DIAGNOSIS — N2581 Secondary hyperparathyroidism of renal origin: Secondary | ICD-10-CM | POA: Diagnosis not present

## 2023-05-30 DIAGNOSIS — D509 Iron deficiency anemia, unspecified: Secondary | ICD-10-CM | POA: Diagnosis not present

## 2023-05-30 DIAGNOSIS — N186 End stage renal disease: Secondary | ICD-10-CM | POA: Diagnosis not present

## 2023-05-30 DIAGNOSIS — D631 Anemia in chronic kidney disease: Secondary | ICD-10-CM | POA: Diagnosis not present

## 2023-05-30 DIAGNOSIS — Z992 Dependence on renal dialysis: Secondary | ICD-10-CM | POA: Diagnosis not present

## 2023-06-01 DIAGNOSIS — Z992 Dependence on renal dialysis: Secondary | ICD-10-CM | POA: Diagnosis not present

## 2023-06-01 DIAGNOSIS — N2581 Secondary hyperparathyroidism of renal origin: Secondary | ICD-10-CM | POA: Diagnosis not present

## 2023-06-01 DIAGNOSIS — D631 Anemia in chronic kidney disease: Secondary | ICD-10-CM | POA: Diagnosis not present

## 2023-06-01 DIAGNOSIS — D509 Iron deficiency anemia, unspecified: Secondary | ICD-10-CM | POA: Diagnosis not present

## 2023-06-01 DIAGNOSIS — N186 End stage renal disease: Secondary | ICD-10-CM | POA: Diagnosis not present

## 2023-06-04 DIAGNOSIS — D631 Anemia in chronic kidney disease: Secondary | ICD-10-CM | POA: Diagnosis not present

## 2023-06-04 DIAGNOSIS — N186 End stage renal disease: Secondary | ICD-10-CM | POA: Diagnosis not present

## 2023-06-04 DIAGNOSIS — Z992 Dependence on renal dialysis: Secondary | ICD-10-CM | POA: Diagnosis not present

## 2023-06-04 DIAGNOSIS — N2581 Secondary hyperparathyroidism of renal origin: Secondary | ICD-10-CM | POA: Diagnosis not present

## 2023-06-04 DIAGNOSIS — D509 Iron deficiency anemia, unspecified: Secondary | ICD-10-CM | POA: Diagnosis not present

## 2023-06-06 DIAGNOSIS — D631 Anemia in chronic kidney disease: Secondary | ICD-10-CM | POA: Diagnosis not present

## 2023-06-06 DIAGNOSIS — D509 Iron deficiency anemia, unspecified: Secondary | ICD-10-CM | POA: Diagnosis not present

## 2023-06-06 DIAGNOSIS — N2581 Secondary hyperparathyroidism of renal origin: Secondary | ICD-10-CM | POA: Diagnosis not present

## 2023-06-06 DIAGNOSIS — N186 End stage renal disease: Secondary | ICD-10-CM | POA: Diagnosis not present

## 2023-06-06 DIAGNOSIS — Z992 Dependence on renal dialysis: Secondary | ICD-10-CM | POA: Diagnosis not present

## 2023-06-08 DIAGNOSIS — D509 Iron deficiency anemia, unspecified: Secondary | ICD-10-CM | POA: Diagnosis not present

## 2023-06-08 DIAGNOSIS — N2581 Secondary hyperparathyroidism of renal origin: Secondary | ICD-10-CM | POA: Diagnosis not present

## 2023-06-08 DIAGNOSIS — D631 Anemia in chronic kidney disease: Secondary | ICD-10-CM | POA: Diagnosis not present

## 2023-06-08 DIAGNOSIS — N186 End stage renal disease: Secondary | ICD-10-CM | POA: Diagnosis not present

## 2023-06-08 DIAGNOSIS — Z992 Dependence on renal dialysis: Secondary | ICD-10-CM | POA: Diagnosis not present

## 2023-06-11 DIAGNOSIS — N2581 Secondary hyperparathyroidism of renal origin: Secondary | ICD-10-CM | POA: Diagnosis not present

## 2023-06-11 DIAGNOSIS — N186 End stage renal disease: Secondary | ICD-10-CM | POA: Diagnosis not present

## 2023-06-11 DIAGNOSIS — D509 Iron deficiency anemia, unspecified: Secondary | ICD-10-CM | POA: Diagnosis not present

## 2023-06-11 DIAGNOSIS — Z992 Dependence on renal dialysis: Secondary | ICD-10-CM | POA: Diagnosis not present

## 2023-06-11 DIAGNOSIS — D631 Anemia in chronic kidney disease: Secondary | ICD-10-CM | POA: Diagnosis not present

## 2023-06-12 NOTE — Progress Notes (Signed)
[facility] Summary Report / Loop Recorder 

## 2023-06-13 ENCOUNTER — Telehealth: Payer: Self-pay

## 2023-06-13 DIAGNOSIS — N186 End stage renal disease: Secondary | ICD-10-CM | POA: Diagnosis not present

## 2023-06-13 DIAGNOSIS — D631 Anemia in chronic kidney disease: Secondary | ICD-10-CM | POA: Diagnosis not present

## 2023-06-13 DIAGNOSIS — Z992 Dependence on renal dialysis: Secondary | ICD-10-CM | POA: Diagnosis not present

## 2023-06-13 DIAGNOSIS — D509 Iron deficiency anemia, unspecified: Secondary | ICD-10-CM | POA: Diagnosis not present

## 2023-06-13 DIAGNOSIS — N2581 Secondary hyperparathyroidism of renal origin: Secondary | ICD-10-CM | POA: Diagnosis not present

## 2023-06-13 NOTE — Telephone Encounter (Signed)
Lmom to discuss lab results. Waiting on a return call.  

## 2023-06-14 ENCOUNTER — Ambulatory Visit (INDEPENDENT_AMBULATORY_CARE_PROVIDER_SITE_OTHER): Payer: Medicare Other

## 2023-06-14 DIAGNOSIS — Z8673 Personal history of transient ischemic attack (TIA), and cerebral infarction without residual deficits: Secondary | ICD-10-CM

## 2023-06-14 LAB — CUP PACEART REMOTE DEVICE CHECK
Date Time Interrogation Session: 20240620000728
Implantable Pulse Generator Implant Date: 20230201

## 2023-06-15 DIAGNOSIS — Z992 Dependence on renal dialysis: Secondary | ICD-10-CM | POA: Diagnosis not present

## 2023-06-15 DIAGNOSIS — N186 End stage renal disease: Secondary | ICD-10-CM | POA: Diagnosis not present

## 2023-06-15 DIAGNOSIS — D631 Anemia in chronic kidney disease: Secondary | ICD-10-CM | POA: Diagnosis not present

## 2023-06-15 DIAGNOSIS — N2581 Secondary hyperparathyroidism of renal origin: Secondary | ICD-10-CM | POA: Diagnosis not present

## 2023-06-15 DIAGNOSIS — D509 Iron deficiency anemia, unspecified: Secondary | ICD-10-CM | POA: Diagnosis not present

## 2023-06-18 DIAGNOSIS — Z992 Dependence on renal dialysis: Secondary | ICD-10-CM | POA: Diagnosis not present

## 2023-06-18 DIAGNOSIS — N186 End stage renal disease: Secondary | ICD-10-CM | POA: Diagnosis not present

## 2023-06-18 DIAGNOSIS — D631 Anemia in chronic kidney disease: Secondary | ICD-10-CM | POA: Diagnosis not present

## 2023-06-18 DIAGNOSIS — D509 Iron deficiency anemia, unspecified: Secondary | ICD-10-CM | POA: Diagnosis not present

## 2023-06-18 DIAGNOSIS — N2581 Secondary hyperparathyroidism of renal origin: Secondary | ICD-10-CM | POA: Diagnosis not present

## 2023-06-19 NOTE — Telephone Encounter (Signed)
Letter mailed with lab results

## 2023-06-20 DIAGNOSIS — D631 Anemia in chronic kidney disease: Secondary | ICD-10-CM | POA: Diagnosis not present

## 2023-06-20 DIAGNOSIS — D509 Iron deficiency anemia, unspecified: Secondary | ICD-10-CM | POA: Diagnosis not present

## 2023-06-20 DIAGNOSIS — N186 End stage renal disease: Secondary | ICD-10-CM | POA: Diagnosis not present

## 2023-06-20 DIAGNOSIS — Z992 Dependence on renal dialysis: Secondary | ICD-10-CM | POA: Diagnosis not present

## 2023-06-20 DIAGNOSIS — N2581 Secondary hyperparathyroidism of renal origin: Secondary | ICD-10-CM | POA: Diagnosis not present

## 2023-06-22 DIAGNOSIS — D509 Iron deficiency anemia, unspecified: Secondary | ICD-10-CM | POA: Diagnosis not present

## 2023-06-22 DIAGNOSIS — Z992 Dependence on renal dialysis: Secondary | ICD-10-CM | POA: Diagnosis not present

## 2023-06-22 DIAGNOSIS — N2581 Secondary hyperparathyroidism of renal origin: Secondary | ICD-10-CM | POA: Diagnosis not present

## 2023-06-22 DIAGNOSIS — N186 End stage renal disease: Secondary | ICD-10-CM | POA: Diagnosis not present

## 2023-06-22 DIAGNOSIS — D631 Anemia in chronic kidney disease: Secondary | ICD-10-CM | POA: Diagnosis not present

## 2023-06-24 DIAGNOSIS — I129 Hypertensive chronic kidney disease with stage 1 through stage 4 chronic kidney disease, or unspecified chronic kidney disease: Secondary | ICD-10-CM | POA: Diagnosis not present

## 2023-06-24 DIAGNOSIS — N186 End stage renal disease: Secondary | ICD-10-CM | POA: Diagnosis not present

## 2023-06-24 DIAGNOSIS — Z992 Dependence on renal dialysis: Secondary | ICD-10-CM | POA: Diagnosis not present

## 2023-06-25 ENCOUNTER — Telehealth: Payer: Self-pay | Admitting: Nurse Practitioner

## 2023-06-25 DIAGNOSIS — N2581 Secondary hyperparathyroidism of renal origin: Secondary | ICD-10-CM | POA: Diagnosis not present

## 2023-06-25 DIAGNOSIS — Z992 Dependence on renal dialysis: Secondary | ICD-10-CM | POA: Diagnosis not present

## 2023-06-25 DIAGNOSIS — D509 Iron deficiency anemia, unspecified: Secondary | ICD-10-CM | POA: Diagnosis not present

## 2023-06-25 DIAGNOSIS — D631 Anemia in chronic kidney disease: Secondary | ICD-10-CM | POA: Diagnosis not present

## 2023-06-25 DIAGNOSIS — N186 End stage renal disease: Secondary | ICD-10-CM | POA: Diagnosis not present

## 2023-06-25 MED ORDER — AMLODIPINE BESYLATE 2.5 MG PO TABS: 2.5000 mg | ORAL_TABLET | Freq: Every day | ORAL | 2 refills | Status: AC

## 2023-06-25 NOTE — Telephone Encounter (Signed)
Pt's medication was sent to pt's pharmacy as requested. Confirmation received.  °

## 2023-06-25 NOTE — Telephone Encounter (Signed)
*  STAT* If patient is at the pharmacy, call can be transferred to refill team.   1. Which medications need to be refilled? (please list name of each medication and dose if known)   amLODipine (NORVASC) 2.5 MG tablet    2. Which pharmacy/location (including street and city if local pharmacy) is medication to be sent to?  [id number] - [place], [place] - [address]      3. Do they need a 30 day or 90 day supply? 90 day    Pt is completely out of medication

## 2023-06-27 DIAGNOSIS — Z992 Dependence on renal dialysis: Secondary | ICD-10-CM | POA: Diagnosis not present

## 2023-06-27 DIAGNOSIS — N2581 Secondary hyperparathyroidism of renal origin: Secondary | ICD-10-CM | POA: Diagnosis not present

## 2023-06-27 DIAGNOSIS — D509 Iron deficiency anemia, unspecified: Secondary | ICD-10-CM | POA: Diagnosis not present

## 2023-06-27 DIAGNOSIS — N186 End stage renal disease: Secondary | ICD-10-CM | POA: Diagnosis not present

## 2023-06-27 DIAGNOSIS — D631 Anemia in chronic kidney disease: Secondary | ICD-10-CM | POA: Diagnosis not present

## 2023-06-29 DIAGNOSIS — D631 Anemia in chronic kidney disease: Secondary | ICD-10-CM | POA: Diagnosis not present

## 2023-06-29 DIAGNOSIS — N2581 Secondary hyperparathyroidism of renal origin: Secondary | ICD-10-CM | POA: Diagnosis not present

## 2023-06-29 DIAGNOSIS — N186 End stage renal disease: Secondary | ICD-10-CM | POA: Diagnosis not present

## 2023-06-29 DIAGNOSIS — Z992 Dependence on renal dialysis: Secondary | ICD-10-CM | POA: Diagnosis not present

## 2023-06-29 DIAGNOSIS — D509 Iron deficiency anemia, unspecified: Secondary | ICD-10-CM | POA: Diagnosis not present

## 2023-07-02 DIAGNOSIS — N2581 Secondary hyperparathyroidism of renal origin: Secondary | ICD-10-CM | POA: Diagnosis not present

## 2023-07-02 DIAGNOSIS — D631 Anemia in chronic kidney disease: Secondary | ICD-10-CM | POA: Diagnosis not present

## 2023-07-02 DIAGNOSIS — D509 Iron deficiency anemia, unspecified: Secondary | ICD-10-CM | POA: Diagnosis not present

## 2023-07-02 DIAGNOSIS — Z992 Dependence on renal dialysis: Secondary | ICD-10-CM | POA: Diagnosis not present

## 2023-07-02 DIAGNOSIS — N186 End stage renal disease: Secondary | ICD-10-CM | POA: Diagnosis not present

## 2023-07-03 DIAGNOSIS — K219 Gastro-esophageal reflux disease without esophagitis: Secondary | ICD-10-CM | POA: Diagnosis not present

## 2023-07-03 DIAGNOSIS — I700 Atherosclerosis of aorta: Secondary | ICD-10-CM | POA: Diagnosis not present

## 2023-07-03 DIAGNOSIS — Z0000 Encounter for general adult medical examination without abnormal findings: Secondary | ICD-10-CM | POA: Diagnosis not present

## 2023-07-03 DIAGNOSIS — E441 Mild protein-calorie malnutrition: Secondary | ICD-10-CM | POA: Diagnosis not present

## 2023-07-03 DIAGNOSIS — N186 End stage renal disease: Secondary | ICD-10-CM | POA: Diagnosis not present

## 2023-07-03 DIAGNOSIS — I132 Hypertensive heart and chronic kidney disease with heart failure and with stage 5 chronic kidney disease, or end stage renal disease: Secondary | ICD-10-CM | POA: Diagnosis not present

## 2023-07-03 DIAGNOSIS — R202 Paresthesia of skin: Secondary | ICD-10-CM | POA: Diagnosis not present

## 2023-07-03 DIAGNOSIS — Z1389 Encounter for screening for other disorder: Secondary | ICD-10-CM | POA: Diagnosis not present

## 2023-07-03 DIAGNOSIS — E0590 Thyrotoxicosis, unspecified without thyrotoxic crisis or storm: Secondary | ICD-10-CM | POA: Diagnosis not present

## 2023-07-03 DIAGNOSIS — I129 Hypertensive chronic kidney disease with stage 1 through stage 4 chronic kidney disease, or unspecified chronic kidney disease: Secondary | ICD-10-CM | POA: Diagnosis not present

## 2023-07-03 DIAGNOSIS — I10 Essential (primary) hypertension: Secondary | ICD-10-CM | POA: Diagnosis not present

## 2023-07-03 DIAGNOSIS — E1122 Type 2 diabetes mellitus with diabetic chronic kidney disease: Secondary | ICD-10-CM | POA: Diagnosis not present

## 2023-07-03 DIAGNOSIS — E1142 Type 2 diabetes mellitus with diabetic polyneuropathy: Secondary | ICD-10-CM | POA: Diagnosis not present

## 2023-07-03 DIAGNOSIS — I5032 Chronic diastolic (congestive) heart failure: Secondary | ICD-10-CM | POA: Diagnosis not present

## 2023-07-03 DIAGNOSIS — Z992 Dependence on renal dialysis: Secondary | ICD-10-CM | POA: Diagnosis not present

## 2023-07-03 DIAGNOSIS — E782 Mixed hyperlipidemia: Secondary | ICD-10-CM | POA: Diagnosis not present

## 2023-07-04 DIAGNOSIS — N2581 Secondary hyperparathyroidism of renal origin: Secondary | ICD-10-CM | POA: Diagnosis not present

## 2023-07-04 DIAGNOSIS — Z992 Dependence on renal dialysis: Secondary | ICD-10-CM | POA: Diagnosis not present

## 2023-07-04 DIAGNOSIS — D509 Iron deficiency anemia, unspecified: Secondary | ICD-10-CM | POA: Diagnosis not present

## 2023-07-04 DIAGNOSIS — N186 End stage renal disease: Secondary | ICD-10-CM | POA: Diagnosis not present

## 2023-07-04 DIAGNOSIS — D631 Anemia in chronic kidney disease: Secondary | ICD-10-CM | POA: Diagnosis not present

## 2023-07-04 NOTE — Progress Notes (Signed)
[facility] Summary Report / Loop Recorder 

## 2023-07-06 DIAGNOSIS — N186 End stage renal disease: Secondary | ICD-10-CM | POA: Diagnosis not present

## 2023-07-06 DIAGNOSIS — N2581 Secondary hyperparathyroidism of renal origin: Secondary | ICD-10-CM | POA: Diagnosis not present

## 2023-07-06 DIAGNOSIS — D509 Iron deficiency anemia, unspecified: Secondary | ICD-10-CM | POA: Diagnosis not present

## 2023-07-06 DIAGNOSIS — Z992 Dependence on renal dialysis: Secondary | ICD-10-CM | POA: Diagnosis not present

## 2023-07-06 DIAGNOSIS — D631 Anemia in chronic kidney disease: Secondary | ICD-10-CM | POA: Diagnosis not present

## 2023-07-09 ENCOUNTER — Ambulatory Visit: Payer: Medicare Other | Admitting: Cardiovascular Disease

## 2023-07-09 DIAGNOSIS — N2581 Secondary hyperparathyroidism of renal origin: Secondary | ICD-10-CM | POA: Diagnosis not present

## 2023-07-09 DIAGNOSIS — N186 End stage renal disease: Secondary | ICD-10-CM | POA: Diagnosis not present

## 2023-07-09 DIAGNOSIS — Z992 Dependence on renal dialysis: Secondary | ICD-10-CM | POA: Diagnosis not present

## 2023-07-09 DIAGNOSIS — D631 Anemia in chronic kidney disease: Secondary | ICD-10-CM | POA: Diagnosis not present

## 2023-07-09 DIAGNOSIS — D509 Iron deficiency anemia, unspecified: Secondary | ICD-10-CM | POA: Diagnosis not present

## 2023-07-11 DIAGNOSIS — Z992 Dependence on renal dialysis: Secondary | ICD-10-CM | POA: Diagnosis not present

## 2023-07-11 DIAGNOSIS — N186 End stage renal disease: Secondary | ICD-10-CM | POA: Diagnosis not present

## 2023-07-11 DIAGNOSIS — D509 Iron deficiency anemia, unspecified: Secondary | ICD-10-CM | POA: Diagnosis not present

## 2023-07-11 DIAGNOSIS — D631 Anemia in chronic kidney disease: Secondary | ICD-10-CM | POA: Diagnosis not present

## 2023-07-11 DIAGNOSIS — N2581 Secondary hyperparathyroidism of renal origin: Secondary | ICD-10-CM | POA: Diagnosis not present

## 2023-07-13 DIAGNOSIS — D631 Anemia in chronic kidney disease: Secondary | ICD-10-CM | POA: Diagnosis not present

## 2023-07-13 DIAGNOSIS — Z992 Dependence on renal dialysis: Secondary | ICD-10-CM | POA: Diagnosis not present

## 2023-07-13 DIAGNOSIS — D509 Iron deficiency anemia, unspecified: Secondary | ICD-10-CM | POA: Diagnosis not present

## 2023-07-13 DIAGNOSIS — N2581 Secondary hyperparathyroidism of renal origin: Secondary | ICD-10-CM | POA: Diagnosis not present

## 2023-07-13 DIAGNOSIS — N186 End stage renal disease: Secondary | ICD-10-CM | POA: Diagnosis not present

## 2023-07-16 DIAGNOSIS — Z992 Dependence on renal dialysis: Secondary | ICD-10-CM | POA: Diagnosis not present

## 2023-07-16 DIAGNOSIS — D509 Iron deficiency anemia, unspecified: Secondary | ICD-10-CM | POA: Diagnosis not present

## 2023-07-16 DIAGNOSIS — N2581 Secondary hyperparathyroidism of renal origin: Secondary | ICD-10-CM | POA: Diagnosis not present

## 2023-07-16 DIAGNOSIS — N186 End stage renal disease: Secondary | ICD-10-CM | POA: Diagnosis not present

## 2023-07-16 DIAGNOSIS — D631 Anemia in chronic kidney disease: Secondary | ICD-10-CM | POA: Diagnosis not present

## 2023-07-17 ENCOUNTER — Ambulatory Visit (INDEPENDENT_AMBULATORY_CARE_PROVIDER_SITE_OTHER): Payer: Medicare Other

## 2023-07-17 DIAGNOSIS — Z8673 Personal history of transient ischemic attack (TIA), and cerebral infarction without residual deficits: Secondary | ICD-10-CM

## 2023-07-17 LAB — CUP PACEART REMOTE DEVICE CHECK
Date Time Interrogation Session: 20240721025645
Implantable Pulse Generator Implant Date: 20230201
Zone Setting Status: 755011
Zone Setting Status: 755011

## 2023-07-18 DIAGNOSIS — D509 Iron deficiency anemia, unspecified: Secondary | ICD-10-CM | POA: Diagnosis not present

## 2023-07-18 DIAGNOSIS — D631 Anemia in chronic kidney disease: Secondary | ICD-10-CM | POA: Diagnosis not present

## 2023-07-18 DIAGNOSIS — Z992 Dependence on renal dialysis: Secondary | ICD-10-CM | POA: Diagnosis not present

## 2023-07-18 DIAGNOSIS — N186 End stage renal disease: Secondary | ICD-10-CM | POA: Diagnosis not present

## 2023-07-18 DIAGNOSIS — N2581 Secondary hyperparathyroidism of renal origin: Secondary | ICD-10-CM | POA: Diagnosis not present

## 2023-07-20 DIAGNOSIS — N186 End stage renal disease: Secondary | ICD-10-CM | POA: Diagnosis not present

## 2023-07-20 DIAGNOSIS — D631 Anemia in chronic kidney disease: Secondary | ICD-10-CM | POA: Diagnosis not present

## 2023-07-20 DIAGNOSIS — Z992 Dependence on renal dialysis: Secondary | ICD-10-CM | POA: Diagnosis not present

## 2023-07-20 DIAGNOSIS — D509 Iron deficiency anemia, unspecified: Secondary | ICD-10-CM | POA: Diagnosis not present

## 2023-07-20 DIAGNOSIS — N2581 Secondary hyperparathyroidism of renal origin: Secondary | ICD-10-CM | POA: Diagnosis not present

## 2023-07-23 DIAGNOSIS — Z992 Dependence on renal dialysis: Secondary | ICD-10-CM | POA: Diagnosis not present

## 2023-07-23 DIAGNOSIS — N2581 Secondary hyperparathyroidism of renal origin: Secondary | ICD-10-CM | POA: Diagnosis not present

## 2023-07-23 DIAGNOSIS — D631 Anemia in chronic kidney disease: Secondary | ICD-10-CM | POA: Diagnosis not present

## 2023-07-23 DIAGNOSIS — D509 Iron deficiency anemia, unspecified: Secondary | ICD-10-CM | POA: Diagnosis not present

## 2023-07-23 DIAGNOSIS — N186 End stage renal disease: Secondary | ICD-10-CM | POA: Diagnosis not present

## 2023-07-25 DIAGNOSIS — N2581 Secondary hyperparathyroidism of renal origin: Secondary | ICD-10-CM | POA: Diagnosis not present

## 2023-07-25 DIAGNOSIS — I129 Hypertensive chronic kidney disease with stage 1 through stage 4 chronic kidney disease, or unspecified chronic kidney disease: Secondary | ICD-10-CM | POA: Diagnosis not present

## 2023-07-25 DIAGNOSIS — D509 Iron deficiency anemia, unspecified: Secondary | ICD-10-CM | POA: Diagnosis not present

## 2023-07-25 DIAGNOSIS — D631 Anemia in chronic kidney disease: Secondary | ICD-10-CM | POA: Diagnosis not present

## 2023-07-25 DIAGNOSIS — Z992 Dependence on renal dialysis: Secondary | ICD-10-CM | POA: Diagnosis not present

## 2023-07-25 DIAGNOSIS — N186 End stage renal disease: Secondary | ICD-10-CM | POA: Diagnosis not present

## 2023-07-27 DIAGNOSIS — Z992 Dependence on renal dialysis: Secondary | ICD-10-CM | POA: Diagnosis not present

## 2023-07-27 DIAGNOSIS — N2581 Secondary hyperparathyroidism of renal origin: Secondary | ICD-10-CM | POA: Diagnosis not present

## 2023-07-27 DIAGNOSIS — D509 Iron deficiency anemia, unspecified: Secondary | ICD-10-CM | POA: Diagnosis not present

## 2023-07-27 DIAGNOSIS — N186 End stage renal disease: Secondary | ICD-10-CM | POA: Diagnosis not present

## 2023-07-27 DIAGNOSIS — D631 Anemia in chronic kidney disease: Secondary | ICD-10-CM | POA: Diagnosis not present

## 2023-07-27 DIAGNOSIS — E1122 Type 2 diabetes mellitus with diabetic chronic kidney disease: Secondary | ICD-10-CM | POA: Diagnosis not present

## 2023-07-30 DIAGNOSIS — N2581 Secondary hyperparathyroidism of renal origin: Secondary | ICD-10-CM | POA: Diagnosis not present

## 2023-07-30 DIAGNOSIS — D631 Anemia in chronic kidney disease: Secondary | ICD-10-CM | POA: Diagnosis not present

## 2023-07-30 DIAGNOSIS — D509 Iron deficiency anemia, unspecified: Secondary | ICD-10-CM | POA: Diagnosis not present

## 2023-07-30 DIAGNOSIS — Z992 Dependence on renal dialysis: Secondary | ICD-10-CM | POA: Diagnosis not present

## 2023-07-30 DIAGNOSIS — N186 End stage renal disease: Secondary | ICD-10-CM | POA: Diagnosis not present

## 2023-07-30 DIAGNOSIS — E1122 Type 2 diabetes mellitus with diabetic chronic kidney disease: Secondary | ICD-10-CM | POA: Diagnosis not present

## 2023-07-31 NOTE — Progress Notes (Signed)
[facility] Summary Report / Loop Recorder 

## 2023-08-01 DIAGNOSIS — Z992 Dependence on renal dialysis: Secondary | ICD-10-CM | POA: Diagnosis not present

## 2023-08-01 DIAGNOSIS — N186 End stage renal disease: Secondary | ICD-10-CM | POA: Diagnosis not present

## 2023-08-01 DIAGNOSIS — E1122 Type 2 diabetes mellitus with diabetic chronic kidney disease: Secondary | ICD-10-CM | POA: Diagnosis not present

## 2023-08-01 DIAGNOSIS — D509 Iron deficiency anemia, unspecified: Secondary | ICD-10-CM | POA: Diagnosis not present

## 2023-08-01 DIAGNOSIS — N2581 Secondary hyperparathyroidism of renal origin: Secondary | ICD-10-CM | POA: Diagnosis not present

## 2023-08-01 DIAGNOSIS — D631 Anemia in chronic kidney disease: Secondary | ICD-10-CM | POA: Diagnosis not present

## 2023-08-03 DIAGNOSIS — N2581 Secondary hyperparathyroidism of renal origin: Secondary | ICD-10-CM | POA: Diagnosis not present

## 2023-08-03 DIAGNOSIS — N186 End stage renal disease: Secondary | ICD-10-CM | POA: Diagnosis not present

## 2023-08-03 DIAGNOSIS — Z992 Dependence on renal dialysis: Secondary | ICD-10-CM | POA: Diagnosis not present

## 2023-08-03 DIAGNOSIS — D509 Iron deficiency anemia, unspecified: Secondary | ICD-10-CM | POA: Diagnosis not present

## 2023-08-03 DIAGNOSIS — D631 Anemia in chronic kidney disease: Secondary | ICD-10-CM | POA: Diagnosis not present

## 2023-08-03 DIAGNOSIS — E1122 Type 2 diabetes mellitus with diabetic chronic kidney disease: Secondary | ICD-10-CM | POA: Diagnosis not present

## 2023-08-06 DIAGNOSIS — E1122 Type 2 diabetes mellitus with diabetic chronic kidney disease: Secondary | ICD-10-CM | POA: Diagnosis not present

## 2023-08-06 DIAGNOSIS — D509 Iron deficiency anemia, unspecified: Secondary | ICD-10-CM | POA: Diagnosis not present

## 2023-08-06 DIAGNOSIS — N2581 Secondary hyperparathyroidism of renal origin: Secondary | ICD-10-CM | POA: Diagnosis not present

## 2023-08-06 DIAGNOSIS — Z992 Dependence on renal dialysis: Secondary | ICD-10-CM | POA: Diagnosis not present

## 2023-08-06 DIAGNOSIS — N186 End stage renal disease: Secondary | ICD-10-CM | POA: Diagnosis not present

## 2023-08-06 DIAGNOSIS — D631 Anemia in chronic kidney disease: Secondary | ICD-10-CM | POA: Diagnosis not present

## 2023-08-08 DIAGNOSIS — E1122 Type 2 diabetes mellitus with diabetic chronic kidney disease: Secondary | ICD-10-CM | POA: Diagnosis not present

## 2023-08-08 DIAGNOSIS — D631 Anemia in chronic kidney disease: Secondary | ICD-10-CM | POA: Diagnosis not present

## 2023-08-08 DIAGNOSIS — Z992 Dependence on renal dialysis: Secondary | ICD-10-CM | POA: Diagnosis not present

## 2023-08-08 DIAGNOSIS — D509 Iron deficiency anemia, unspecified: Secondary | ICD-10-CM | POA: Diagnosis not present

## 2023-08-08 DIAGNOSIS — N2581 Secondary hyperparathyroidism of renal origin: Secondary | ICD-10-CM | POA: Diagnosis not present

## 2023-08-08 DIAGNOSIS — N186 End stage renal disease: Secondary | ICD-10-CM | POA: Diagnosis not present

## 2023-08-10 DIAGNOSIS — N2581 Secondary hyperparathyroidism of renal origin: Secondary | ICD-10-CM | POA: Diagnosis not present

## 2023-08-10 DIAGNOSIS — N186 End stage renal disease: Secondary | ICD-10-CM | POA: Diagnosis not present

## 2023-08-10 DIAGNOSIS — D631 Anemia in chronic kidney disease: Secondary | ICD-10-CM | POA: Diagnosis not present

## 2023-08-10 DIAGNOSIS — Z992 Dependence on renal dialysis: Secondary | ICD-10-CM | POA: Diagnosis not present

## 2023-08-10 DIAGNOSIS — D509 Iron deficiency anemia, unspecified: Secondary | ICD-10-CM | POA: Diagnosis not present

## 2023-08-10 DIAGNOSIS — E1122 Type 2 diabetes mellitus with diabetic chronic kidney disease: Secondary | ICD-10-CM | POA: Diagnosis not present

## 2023-08-13 DIAGNOSIS — N186 End stage renal disease: Secondary | ICD-10-CM | POA: Diagnosis not present

## 2023-08-13 DIAGNOSIS — D509 Iron deficiency anemia, unspecified: Secondary | ICD-10-CM | POA: Diagnosis not present

## 2023-08-13 DIAGNOSIS — E1122 Type 2 diabetes mellitus with diabetic chronic kidney disease: Secondary | ICD-10-CM | POA: Diagnosis not present

## 2023-08-13 DIAGNOSIS — D631 Anemia in chronic kidney disease: Secondary | ICD-10-CM | POA: Diagnosis not present

## 2023-08-13 DIAGNOSIS — N2581 Secondary hyperparathyroidism of renal origin: Secondary | ICD-10-CM | POA: Diagnosis not present

## 2023-08-13 DIAGNOSIS — Z992 Dependence on renal dialysis: Secondary | ICD-10-CM | POA: Diagnosis not present

## 2023-08-15 DIAGNOSIS — D509 Iron deficiency anemia, unspecified: Secondary | ICD-10-CM | POA: Diagnosis not present

## 2023-08-15 DIAGNOSIS — N186 End stage renal disease: Secondary | ICD-10-CM | POA: Diagnosis not present

## 2023-08-15 DIAGNOSIS — Z992 Dependence on renal dialysis: Secondary | ICD-10-CM | POA: Diagnosis not present

## 2023-08-15 DIAGNOSIS — N2581 Secondary hyperparathyroidism of renal origin: Secondary | ICD-10-CM | POA: Diagnosis not present

## 2023-08-15 DIAGNOSIS — E1122 Type 2 diabetes mellitus with diabetic chronic kidney disease: Secondary | ICD-10-CM | POA: Diagnosis not present

## 2023-08-15 DIAGNOSIS — D631 Anemia in chronic kidney disease: Secondary | ICD-10-CM | POA: Diagnosis not present

## 2023-08-17 DIAGNOSIS — E1122 Type 2 diabetes mellitus with diabetic chronic kidney disease: Secondary | ICD-10-CM | POA: Diagnosis not present

## 2023-08-17 DIAGNOSIS — D631 Anemia in chronic kidney disease: Secondary | ICD-10-CM | POA: Diagnosis not present

## 2023-08-17 DIAGNOSIS — D509 Iron deficiency anemia, unspecified: Secondary | ICD-10-CM | POA: Diagnosis not present

## 2023-08-17 DIAGNOSIS — N186 End stage renal disease: Secondary | ICD-10-CM | POA: Diagnosis not present

## 2023-08-17 DIAGNOSIS — Z992 Dependence on renal dialysis: Secondary | ICD-10-CM | POA: Diagnosis not present

## 2023-08-17 DIAGNOSIS — N2581 Secondary hyperparathyroidism of renal origin: Secondary | ICD-10-CM | POA: Diagnosis not present

## 2023-08-19 LAB — CUP PACEART REMOTE DEVICE CHECK
Date Time Interrogation Session: 20240825000058
Implantable Pulse Generator Implant Date: 20230201

## 2023-08-20 ENCOUNTER — Ambulatory Visit (INDEPENDENT_AMBULATORY_CARE_PROVIDER_SITE_OTHER): Payer: Medicare Other

## 2023-08-20 DIAGNOSIS — I639 Cerebral infarction, unspecified: Secondary | ICD-10-CM | POA: Diagnosis not present

## 2023-08-20 DIAGNOSIS — Z992 Dependence on renal dialysis: Secondary | ICD-10-CM | POA: Diagnosis not present

## 2023-08-20 DIAGNOSIS — E1122 Type 2 diabetes mellitus with diabetic chronic kidney disease: Secondary | ICD-10-CM | POA: Diagnosis not present

## 2023-08-20 DIAGNOSIS — D631 Anemia in chronic kidney disease: Secondary | ICD-10-CM | POA: Diagnosis not present

## 2023-08-20 DIAGNOSIS — N186 End stage renal disease: Secondary | ICD-10-CM | POA: Diagnosis not present

## 2023-08-20 DIAGNOSIS — D509 Iron deficiency anemia, unspecified: Secondary | ICD-10-CM | POA: Diagnosis not present

## 2023-08-20 DIAGNOSIS — N2581 Secondary hyperparathyroidism of renal origin: Secondary | ICD-10-CM | POA: Diagnosis not present

## 2023-08-22 DIAGNOSIS — D631 Anemia in chronic kidney disease: Secondary | ICD-10-CM | POA: Diagnosis not present

## 2023-08-22 DIAGNOSIS — N2581 Secondary hyperparathyroidism of renal origin: Secondary | ICD-10-CM | POA: Diagnosis not present

## 2023-08-22 DIAGNOSIS — E1122 Type 2 diabetes mellitus with diabetic chronic kidney disease: Secondary | ICD-10-CM | POA: Diagnosis not present

## 2023-08-22 DIAGNOSIS — D509 Iron deficiency anemia, unspecified: Secondary | ICD-10-CM | POA: Diagnosis not present

## 2023-08-22 DIAGNOSIS — Z992 Dependence on renal dialysis: Secondary | ICD-10-CM | POA: Diagnosis not present

## 2023-08-22 DIAGNOSIS — N186 End stage renal disease: Secondary | ICD-10-CM | POA: Diagnosis not present

## 2023-08-24 DIAGNOSIS — D509 Iron deficiency anemia, unspecified: Secondary | ICD-10-CM | POA: Diagnosis not present

## 2023-08-24 DIAGNOSIS — D631 Anemia in chronic kidney disease: Secondary | ICD-10-CM | POA: Diagnosis not present

## 2023-08-24 DIAGNOSIS — N186 End stage renal disease: Secondary | ICD-10-CM | POA: Diagnosis not present

## 2023-08-24 DIAGNOSIS — Z992 Dependence on renal dialysis: Secondary | ICD-10-CM | POA: Diagnosis not present

## 2023-08-24 DIAGNOSIS — E1122 Type 2 diabetes mellitus with diabetic chronic kidney disease: Secondary | ICD-10-CM | POA: Diagnosis not present

## 2023-08-24 DIAGNOSIS — N2581 Secondary hyperparathyroidism of renal origin: Secondary | ICD-10-CM | POA: Diagnosis not present

## 2023-08-25 DIAGNOSIS — Z992 Dependence on renal dialysis: Secondary | ICD-10-CM | POA: Diagnosis not present

## 2023-08-25 DIAGNOSIS — I129 Hypertensive chronic kidney disease with stage 1 through stage 4 chronic kidney disease, or unspecified chronic kidney disease: Secondary | ICD-10-CM | POA: Diagnosis not present

## 2023-08-25 DIAGNOSIS — N186 End stage renal disease: Secondary | ICD-10-CM | POA: Diagnosis not present

## 2023-08-26 DIAGNOSIS — S46812A Strain of other muscles, fascia and tendons at shoulder and upper arm level, left arm, initial encounter: Secondary | ICD-10-CM | POA: Diagnosis not present

## 2023-08-27 DIAGNOSIS — D509 Iron deficiency anemia, unspecified: Secondary | ICD-10-CM | POA: Diagnosis not present

## 2023-08-27 DIAGNOSIS — D631 Anemia in chronic kidney disease: Secondary | ICD-10-CM | POA: Diagnosis not present

## 2023-08-27 DIAGNOSIS — N2581 Secondary hyperparathyroidism of renal origin: Secondary | ICD-10-CM | POA: Diagnosis not present

## 2023-08-27 DIAGNOSIS — Z992 Dependence on renal dialysis: Secondary | ICD-10-CM | POA: Diagnosis not present

## 2023-08-27 DIAGNOSIS — N186 End stage renal disease: Secondary | ICD-10-CM | POA: Diagnosis not present

## 2023-08-27 DIAGNOSIS — R52 Pain, unspecified: Secondary | ICD-10-CM | POA: Diagnosis not present

## 2023-08-29 DIAGNOSIS — N186 End stage renal disease: Secondary | ICD-10-CM | POA: Diagnosis not present

## 2023-08-29 DIAGNOSIS — D631 Anemia in chronic kidney disease: Secondary | ICD-10-CM | POA: Diagnosis not present

## 2023-08-29 DIAGNOSIS — N2581 Secondary hyperparathyroidism of renal origin: Secondary | ICD-10-CM | POA: Diagnosis not present

## 2023-08-29 DIAGNOSIS — R52 Pain, unspecified: Secondary | ICD-10-CM | POA: Diagnosis not present

## 2023-08-29 DIAGNOSIS — D509 Iron deficiency anemia, unspecified: Secondary | ICD-10-CM | POA: Diagnosis not present

## 2023-08-29 DIAGNOSIS — Z992 Dependence on renal dialysis: Secondary | ICD-10-CM | POA: Diagnosis not present

## 2023-08-29 NOTE — Progress Notes (Signed)
[facility] Summary Report / Loop Recorder 

## 2023-08-31 DIAGNOSIS — N2581 Secondary hyperparathyroidism of renal origin: Secondary | ICD-10-CM | POA: Diagnosis not present

## 2023-08-31 DIAGNOSIS — Z992 Dependence on renal dialysis: Secondary | ICD-10-CM | POA: Diagnosis not present

## 2023-08-31 DIAGNOSIS — D509 Iron deficiency anemia, unspecified: Secondary | ICD-10-CM | POA: Diagnosis not present

## 2023-08-31 DIAGNOSIS — N186 End stage renal disease: Secondary | ICD-10-CM | POA: Diagnosis not present

## 2023-08-31 DIAGNOSIS — R52 Pain, unspecified: Secondary | ICD-10-CM | POA: Diagnosis not present

## 2023-08-31 DIAGNOSIS — D631 Anemia in chronic kidney disease: Secondary | ICD-10-CM | POA: Diagnosis not present

## 2023-09-03 DIAGNOSIS — Z992 Dependence on renal dialysis: Secondary | ICD-10-CM | POA: Diagnosis not present

## 2023-09-03 DIAGNOSIS — N2581 Secondary hyperparathyroidism of renal origin: Secondary | ICD-10-CM | POA: Diagnosis not present

## 2023-09-03 DIAGNOSIS — R52 Pain, unspecified: Secondary | ICD-10-CM | POA: Diagnosis not present

## 2023-09-03 DIAGNOSIS — N186 End stage renal disease: Secondary | ICD-10-CM | POA: Diagnosis not present

## 2023-09-03 DIAGNOSIS — D631 Anemia in chronic kidney disease: Secondary | ICD-10-CM | POA: Diagnosis not present

## 2023-09-03 DIAGNOSIS — D509 Iron deficiency anemia, unspecified: Secondary | ICD-10-CM | POA: Diagnosis not present

## 2023-09-05 DIAGNOSIS — R52 Pain, unspecified: Secondary | ICD-10-CM | POA: Diagnosis not present

## 2023-09-05 DIAGNOSIS — D509 Iron deficiency anemia, unspecified: Secondary | ICD-10-CM | POA: Diagnosis not present

## 2023-09-05 DIAGNOSIS — N2581 Secondary hyperparathyroidism of renal origin: Secondary | ICD-10-CM | POA: Diagnosis not present

## 2023-09-05 DIAGNOSIS — N186 End stage renal disease: Secondary | ICD-10-CM | POA: Diagnosis not present

## 2023-09-05 DIAGNOSIS — Z992 Dependence on renal dialysis: Secondary | ICD-10-CM | POA: Diagnosis not present

## 2023-09-05 DIAGNOSIS — D631 Anemia in chronic kidney disease: Secondary | ICD-10-CM | POA: Diagnosis not present

## 2023-09-07 DIAGNOSIS — R52 Pain, unspecified: Secondary | ICD-10-CM | POA: Diagnosis not present

## 2023-09-07 DIAGNOSIS — Z992 Dependence on renal dialysis: Secondary | ICD-10-CM | POA: Diagnosis not present

## 2023-09-07 DIAGNOSIS — D509 Iron deficiency anemia, unspecified: Secondary | ICD-10-CM | POA: Diagnosis not present

## 2023-09-07 DIAGNOSIS — N186 End stage renal disease: Secondary | ICD-10-CM | POA: Diagnosis not present

## 2023-09-07 DIAGNOSIS — N2581 Secondary hyperparathyroidism of renal origin: Secondary | ICD-10-CM | POA: Diagnosis not present

## 2023-09-07 DIAGNOSIS — D631 Anemia in chronic kidney disease: Secondary | ICD-10-CM | POA: Diagnosis not present

## 2023-09-10 DIAGNOSIS — R52 Pain, unspecified: Secondary | ICD-10-CM | POA: Diagnosis not present

## 2023-09-10 DIAGNOSIS — N2581 Secondary hyperparathyroidism of renal origin: Secondary | ICD-10-CM | POA: Diagnosis not present

## 2023-09-10 DIAGNOSIS — N186 End stage renal disease: Secondary | ICD-10-CM | POA: Diagnosis not present

## 2023-09-10 DIAGNOSIS — Z992 Dependence on renal dialysis: Secondary | ICD-10-CM | POA: Diagnosis not present

## 2023-09-10 DIAGNOSIS — D631 Anemia in chronic kidney disease: Secondary | ICD-10-CM | POA: Diagnosis not present

## 2023-09-10 DIAGNOSIS — D509 Iron deficiency anemia, unspecified: Secondary | ICD-10-CM | POA: Diagnosis not present

## 2023-09-12 DIAGNOSIS — D631 Anemia in chronic kidney disease: Secondary | ICD-10-CM | POA: Diagnosis not present

## 2023-09-12 DIAGNOSIS — Z992 Dependence on renal dialysis: Secondary | ICD-10-CM | POA: Diagnosis not present

## 2023-09-12 DIAGNOSIS — N2581 Secondary hyperparathyroidism of renal origin: Secondary | ICD-10-CM | POA: Diagnosis not present

## 2023-09-12 DIAGNOSIS — R52 Pain, unspecified: Secondary | ICD-10-CM | POA: Diagnosis not present

## 2023-09-12 DIAGNOSIS — D509 Iron deficiency anemia, unspecified: Secondary | ICD-10-CM | POA: Diagnosis not present

## 2023-09-12 DIAGNOSIS — N186 End stage renal disease: Secondary | ICD-10-CM | POA: Diagnosis not present

## 2023-09-14 DIAGNOSIS — N2581 Secondary hyperparathyroidism of renal origin: Secondary | ICD-10-CM | POA: Diagnosis not present

## 2023-09-14 DIAGNOSIS — N186 End stage renal disease: Secondary | ICD-10-CM | POA: Diagnosis not present

## 2023-09-14 DIAGNOSIS — R52 Pain, unspecified: Secondary | ICD-10-CM | POA: Diagnosis not present

## 2023-09-14 DIAGNOSIS — D509 Iron deficiency anemia, unspecified: Secondary | ICD-10-CM | POA: Diagnosis not present

## 2023-09-14 DIAGNOSIS — Z992 Dependence on renal dialysis: Secondary | ICD-10-CM | POA: Diagnosis not present

## 2023-09-14 DIAGNOSIS — D631 Anemia in chronic kidney disease: Secondary | ICD-10-CM | POA: Diagnosis not present

## 2023-09-17 DIAGNOSIS — N186 End stage renal disease: Secondary | ICD-10-CM | POA: Diagnosis not present

## 2023-09-17 DIAGNOSIS — D509 Iron deficiency anemia, unspecified: Secondary | ICD-10-CM | POA: Diagnosis not present

## 2023-09-17 DIAGNOSIS — D631 Anemia in chronic kidney disease: Secondary | ICD-10-CM | POA: Diagnosis not present

## 2023-09-17 DIAGNOSIS — R52 Pain, unspecified: Secondary | ICD-10-CM | POA: Diagnosis not present

## 2023-09-17 DIAGNOSIS — Z992 Dependence on renal dialysis: Secondary | ICD-10-CM | POA: Diagnosis not present

## 2023-09-17 DIAGNOSIS — N2581 Secondary hyperparathyroidism of renal origin: Secondary | ICD-10-CM | POA: Diagnosis not present

## 2023-09-19 DIAGNOSIS — D631 Anemia in chronic kidney disease: Secondary | ICD-10-CM | POA: Diagnosis not present

## 2023-09-19 DIAGNOSIS — D509 Iron deficiency anemia, unspecified: Secondary | ICD-10-CM | POA: Diagnosis not present

## 2023-09-19 DIAGNOSIS — N2581 Secondary hyperparathyroidism of renal origin: Secondary | ICD-10-CM | POA: Diagnosis not present

## 2023-09-19 DIAGNOSIS — Z992 Dependence on renal dialysis: Secondary | ICD-10-CM | POA: Diagnosis not present

## 2023-09-19 DIAGNOSIS — N186 End stage renal disease: Secondary | ICD-10-CM | POA: Diagnosis not present

## 2023-09-19 DIAGNOSIS — R52 Pain, unspecified: Secondary | ICD-10-CM | POA: Diagnosis not present

## 2023-09-21 DIAGNOSIS — N186 End stage renal disease: Secondary | ICD-10-CM | POA: Diagnosis not present

## 2023-09-21 DIAGNOSIS — N2581 Secondary hyperparathyroidism of renal origin: Secondary | ICD-10-CM | POA: Diagnosis not present

## 2023-09-21 DIAGNOSIS — Z992 Dependence on renal dialysis: Secondary | ICD-10-CM | POA: Diagnosis not present

## 2023-09-21 DIAGNOSIS — D509 Iron deficiency anemia, unspecified: Secondary | ICD-10-CM | POA: Diagnosis not present

## 2023-09-21 DIAGNOSIS — R52 Pain, unspecified: Secondary | ICD-10-CM | POA: Diagnosis not present

## 2023-09-21 DIAGNOSIS — D631 Anemia in chronic kidney disease: Secondary | ICD-10-CM | POA: Diagnosis not present

## 2023-09-21 LAB — CUP PACEART REMOTE DEVICE CHECK
Date Time Interrogation Session: 20240927000032
Implantable Pulse Generator Implant Date: 20230201

## 2023-09-24 ENCOUNTER — Ambulatory Visit (INDEPENDENT_AMBULATORY_CARE_PROVIDER_SITE_OTHER): Payer: Medicare Other

## 2023-09-24 DIAGNOSIS — Z8673 Personal history of transient ischemic attack (TIA), and cerebral infarction without residual deficits: Secondary | ICD-10-CM

## 2023-09-24 DIAGNOSIS — D631 Anemia in chronic kidney disease: Secondary | ICD-10-CM | POA: Diagnosis not present

## 2023-09-24 DIAGNOSIS — D509 Iron deficiency anemia, unspecified: Secondary | ICD-10-CM | POA: Diagnosis not present

## 2023-09-24 DIAGNOSIS — Z992 Dependence on renal dialysis: Secondary | ICD-10-CM | POA: Diagnosis not present

## 2023-09-24 DIAGNOSIS — N2581 Secondary hyperparathyroidism of renal origin: Secondary | ICD-10-CM | POA: Diagnosis not present

## 2023-09-24 DIAGNOSIS — R52 Pain, unspecified: Secondary | ICD-10-CM | POA: Diagnosis not present

## 2023-09-24 DIAGNOSIS — N186 End stage renal disease: Secondary | ICD-10-CM | POA: Diagnosis not present

## 2023-09-26 DIAGNOSIS — D509 Iron deficiency anemia, unspecified: Secondary | ICD-10-CM | POA: Diagnosis not present

## 2023-09-26 DIAGNOSIS — Z23 Encounter for immunization: Secondary | ICD-10-CM | POA: Diagnosis not present

## 2023-09-26 DIAGNOSIS — N2581 Secondary hyperparathyroidism of renal origin: Secondary | ICD-10-CM | POA: Diagnosis not present

## 2023-09-26 DIAGNOSIS — D631 Anemia in chronic kidney disease: Secondary | ICD-10-CM | POA: Diagnosis not present

## 2023-09-26 DIAGNOSIS — N186 End stage renal disease: Secondary | ICD-10-CM | POA: Diagnosis not present

## 2023-09-26 DIAGNOSIS — Z992 Dependence on renal dialysis: Secondary | ICD-10-CM | POA: Diagnosis not present

## 2023-09-28 DIAGNOSIS — Z992 Dependence on renal dialysis: Secondary | ICD-10-CM | POA: Diagnosis not present

## 2023-09-28 DIAGNOSIS — N2581 Secondary hyperparathyroidism of renal origin: Secondary | ICD-10-CM | POA: Diagnosis not present

## 2023-09-28 DIAGNOSIS — D509 Iron deficiency anemia, unspecified: Secondary | ICD-10-CM | POA: Diagnosis not present

## 2023-09-28 DIAGNOSIS — Z23 Encounter for immunization: Secondary | ICD-10-CM | POA: Diagnosis not present

## 2023-09-28 DIAGNOSIS — N186 End stage renal disease: Secondary | ICD-10-CM | POA: Diagnosis not present

## 2023-09-28 DIAGNOSIS — D631 Anemia in chronic kidney disease: Secondary | ICD-10-CM | POA: Diagnosis not present

## 2023-10-01 DIAGNOSIS — N2581 Secondary hyperparathyroidism of renal origin: Secondary | ICD-10-CM | POA: Diagnosis not present

## 2023-10-01 DIAGNOSIS — Z992 Dependence on renal dialysis: Secondary | ICD-10-CM | POA: Diagnosis not present

## 2023-10-01 DIAGNOSIS — D631 Anemia in chronic kidney disease: Secondary | ICD-10-CM | POA: Diagnosis not present

## 2023-10-01 DIAGNOSIS — D509 Iron deficiency anemia, unspecified: Secondary | ICD-10-CM | POA: Diagnosis not present

## 2023-10-01 DIAGNOSIS — N186 End stage renal disease: Secondary | ICD-10-CM | POA: Diagnosis not present

## 2023-10-01 DIAGNOSIS — Z23 Encounter for immunization: Secondary | ICD-10-CM | POA: Diagnosis not present

## 2023-10-03 DIAGNOSIS — D509 Iron deficiency anemia, unspecified: Secondary | ICD-10-CM | POA: Diagnosis not present

## 2023-10-03 DIAGNOSIS — Z992 Dependence on renal dialysis: Secondary | ICD-10-CM | POA: Diagnosis not present

## 2023-10-03 DIAGNOSIS — N2581 Secondary hyperparathyroidism of renal origin: Secondary | ICD-10-CM | POA: Diagnosis not present

## 2023-10-03 DIAGNOSIS — D631 Anemia in chronic kidney disease: Secondary | ICD-10-CM | POA: Diagnosis not present

## 2023-10-03 DIAGNOSIS — N186 End stage renal disease: Secondary | ICD-10-CM | POA: Diagnosis not present

## 2023-10-03 DIAGNOSIS — Z23 Encounter for immunization: Secondary | ICD-10-CM | POA: Diagnosis not present

## 2023-10-05 DIAGNOSIS — N2581 Secondary hyperparathyroidism of renal origin: Secondary | ICD-10-CM | POA: Diagnosis not present

## 2023-10-05 DIAGNOSIS — Z23 Encounter for immunization: Secondary | ICD-10-CM | POA: Diagnosis not present

## 2023-10-05 DIAGNOSIS — D631 Anemia in chronic kidney disease: Secondary | ICD-10-CM | POA: Diagnosis not present

## 2023-10-05 DIAGNOSIS — D509 Iron deficiency anemia, unspecified: Secondary | ICD-10-CM | POA: Diagnosis not present

## 2023-10-05 DIAGNOSIS — N186 End stage renal disease: Secondary | ICD-10-CM | POA: Diagnosis not present

## 2023-10-05 DIAGNOSIS — Z992 Dependence on renal dialysis: Secondary | ICD-10-CM | POA: Diagnosis not present

## 2023-10-08 DIAGNOSIS — Z23 Encounter for immunization: Secondary | ICD-10-CM | POA: Diagnosis not present

## 2023-10-08 DIAGNOSIS — N2581 Secondary hyperparathyroidism of renal origin: Secondary | ICD-10-CM | POA: Diagnosis not present

## 2023-10-08 DIAGNOSIS — D631 Anemia in chronic kidney disease: Secondary | ICD-10-CM | POA: Diagnosis not present

## 2023-10-08 DIAGNOSIS — N186 End stage renal disease: Secondary | ICD-10-CM | POA: Diagnosis not present

## 2023-10-08 DIAGNOSIS — Z992 Dependence on renal dialysis: Secondary | ICD-10-CM | POA: Diagnosis not present

## 2023-10-08 DIAGNOSIS — D509 Iron deficiency anemia, unspecified: Secondary | ICD-10-CM | POA: Diagnosis not present

## 2023-10-09 NOTE — Progress Notes (Signed)
[facility] Summary Report / Loop Recorder 

## 2023-10-10 DIAGNOSIS — Z992 Dependence on renal dialysis: Secondary | ICD-10-CM | POA: Diagnosis not present

## 2023-10-10 DIAGNOSIS — N2581 Secondary hyperparathyroidism of renal origin: Secondary | ICD-10-CM | POA: Diagnosis not present

## 2023-10-10 DIAGNOSIS — N186 End stage renal disease: Secondary | ICD-10-CM | POA: Diagnosis not present

## 2023-10-10 DIAGNOSIS — D509 Iron deficiency anemia, unspecified: Secondary | ICD-10-CM | POA: Diagnosis not present

## 2023-10-10 DIAGNOSIS — Z23 Encounter for immunization: Secondary | ICD-10-CM | POA: Diagnosis not present

## 2023-10-10 DIAGNOSIS — D631 Anemia in chronic kidney disease: Secondary | ICD-10-CM | POA: Diagnosis not present

## 2023-10-12 DIAGNOSIS — D509 Iron deficiency anemia, unspecified: Secondary | ICD-10-CM | POA: Diagnosis not present

## 2023-10-12 DIAGNOSIS — Z992 Dependence on renal dialysis: Secondary | ICD-10-CM | POA: Diagnosis not present

## 2023-10-12 DIAGNOSIS — D631 Anemia in chronic kidney disease: Secondary | ICD-10-CM | POA: Diagnosis not present

## 2023-10-12 DIAGNOSIS — N186 End stage renal disease: Secondary | ICD-10-CM | POA: Diagnosis not present

## 2023-10-12 DIAGNOSIS — N2581 Secondary hyperparathyroidism of renal origin: Secondary | ICD-10-CM | POA: Diagnosis not present

## 2023-10-12 DIAGNOSIS — Z23 Encounter for immunization: Secondary | ICD-10-CM | POA: Diagnosis not present

## 2023-10-15 DIAGNOSIS — N2581 Secondary hyperparathyroidism of renal origin: Secondary | ICD-10-CM | POA: Diagnosis not present

## 2023-10-15 DIAGNOSIS — Z992 Dependence on renal dialysis: Secondary | ICD-10-CM | POA: Diagnosis not present

## 2023-10-15 DIAGNOSIS — D509 Iron deficiency anemia, unspecified: Secondary | ICD-10-CM | POA: Diagnosis not present

## 2023-10-15 DIAGNOSIS — Z23 Encounter for immunization: Secondary | ICD-10-CM | POA: Diagnosis not present

## 2023-10-15 DIAGNOSIS — D631 Anemia in chronic kidney disease: Secondary | ICD-10-CM | POA: Diagnosis not present

## 2023-10-15 DIAGNOSIS — N186 End stage renal disease: Secondary | ICD-10-CM | POA: Diagnosis not present

## 2023-10-17 DIAGNOSIS — Z992 Dependence on renal dialysis: Secondary | ICD-10-CM | POA: Diagnosis not present

## 2023-10-17 DIAGNOSIS — D509 Iron deficiency anemia, unspecified: Secondary | ICD-10-CM | POA: Diagnosis not present

## 2023-10-17 DIAGNOSIS — N186 End stage renal disease: Secondary | ICD-10-CM | POA: Diagnosis not present

## 2023-10-17 DIAGNOSIS — Z23 Encounter for immunization: Secondary | ICD-10-CM | POA: Diagnosis not present

## 2023-10-17 DIAGNOSIS — N2581 Secondary hyperparathyroidism of renal origin: Secondary | ICD-10-CM | POA: Diagnosis not present

## 2023-10-17 DIAGNOSIS — D631 Anemia in chronic kidney disease: Secondary | ICD-10-CM | POA: Diagnosis not present

## 2023-10-19 DIAGNOSIS — Z23 Encounter for immunization: Secondary | ICD-10-CM | POA: Diagnosis not present

## 2023-10-19 DIAGNOSIS — Z992 Dependence on renal dialysis: Secondary | ICD-10-CM | POA: Diagnosis not present

## 2023-10-19 DIAGNOSIS — N186 End stage renal disease: Secondary | ICD-10-CM | POA: Diagnosis not present

## 2023-10-19 DIAGNOSIS — N2581 Secondary hyperparathyroidism of renal origin: Secondary | ICD-10-CM | POA: Diagnosis not present

## 2023-10-19 DIAGNOSIS — D631 Anemia in chronic kidney disease: Secondary | ICD-10-CM | POA: Diagnosis not present

## 2023-10-19 DIAGNOSIS — D509 Iron deficiency anemia, unspecified: Secondary | ICD-10-CM | POA: Diagnosis not present

## 2023-10-22 DIAGNOSIS — N2581 Secondary hyperparathyroidism of renal origin: Secondary | ICD-10-CM | POA: Diagnosis not present

## 2023-10-22 DIAGNOSIS — Z23 Encounter for immunization: Secondary | ICD-10-CM | POA: Diagnosis not present

## 2023-10-22 DIAGNOSIS — Z992 Dependence on renal dialysis: Secondary | ICD-10-CM | POA: Diagnosis not present

## 2023-10-22 DIAGNOSIS — D509 Iron deficiency anemia, unspecified: Secondary | ICD-10-CM | POA: Diagnosis not present

## 2023-10-22 DIAGNOSIS — D631 Anemia in chronic kidney disease: Secondary | ICD-10-CM | POA: Diagnosis not present

## 2023-10-22 DIAGNOSIS — N186 End stage renal disease: Secondary | ICD-10-CM | POA: Diagnosis not present

## 2023-10-23 DIAGNOSIS — H26493 Other secondary cataract, bilateral: Secondary | ICD-10-CM | POA: Diagnosis not present

## 2023-10-23 DIAGNOSIS — Z961 Presence of intraocular lens: Secondary | ICD-10-CM | POA: Diagnosis not present

## 2023-10-23 DIAGNOSIS — H35033 Hypertensive retinopathy, bilateral: Secondary | ICD-10-CM | POA: Diagnosis not present

## 2023-10-23 DIAGNOSIS — E119 Type 2 diabetes mellitus without complications: Secondary | ICD-10-CM | POA: Diagnosis not present

## 2023-10-24 ENCOUNTER — Ambulatory Visit: Payer: Medicare Other | Attending: Cardiovascular Disease | Admitting: Cardiovascular Disease

## 2023-10-24 VITALS — BP 104/52 | HR 78 | Ht 62.0 in | Wt 107.8 lb

## 2023-10-24 DIAGNOSIS — E0590 Thyrotoxicosis, unspecified without thyrotoxic crisis or storm: Secondary | ICD-10-CM | POA: Diagnosis not present

## 2023-10-24 DIAGNOSIS — E1122 Type 2 diabetes mellitus with diabetic chronic kidney disease: Secondary | ICD-10-CM

## 2023-10-24 DIAGNOSIS — N186 End stage renal disease: Secondary | ICD-10-CM | POA: Diagnosis not present

## 2023-10-24 DIAGNOSIS — D509 Iron deficiency anemia, unspecified: Secondary | ICD-10-CM | POA: Diagnosis not present

## 2023-10-24 DIAGNOSIS — I10 Essential (primary) hypertension: Secondary | ICD-10-CM | POA: Diagnosis not present

## 2023-10-24 DIAGNOSIS — E7800 Pure hypercholesterolemia, unspecified: Secondary | ICD-10-CM

## 2023-10-24 DIAGNOSIS — N2581 Secondary hyperparathyroidism of renal origin: Secondary | ICD-10-CM | POA: Diagnosis not present

## 2023-10-24 DIAGNOSIS — Z87898 Personal history of other specified conditions: Secondary | ICD-10-CM | POA: Diagnosis not present

## 2023-10-24 DIAGNOSIS — Z992 Dependence on renal dialysis: Secondary | ICD-10-CM | POA: Diagnosis not present

## 2023-10-24 DIAGNOSIS — Z8673 Personal history of transient ischemic attack (TIA), and cerebral infarction without residual deficits: Secondary | ICD-10-CM

## 2023-10-24 DIAGNOSIS — D631 Anemia in chronic kidney disease: Secondary | ICD-10-CM | POA: Diagnosis not present

## 2023-10-24 DIAGNOSIS — F17200 Nicotine dependence, unspecified, uncomplicated: Secondary | ICD-10-CM

## 2023-10-24 DIAGNOSIS — I5032 Chronic diastolic (congestive) heart failure: Secondary | ICD-10-CM

## 2023-10-24 DIAGNOSIS — Z23 Encounter for immunization: Secondary | ICD-10-CM | POA: Diagnosis not present

## 2023-10-24 MED ORDER — METHIMAZOLE 5 MG PO TABS: 7.5000 mg | ORAL_TABLET | Freq: Every day | ORAL | 0 refills | Status: DC

## 2023-10-24 NOTE — Progress Notes (Unsigned)
Cardiology Office Note:    Date:  10/25/2023   ID:  Monica Hunt, DOB Jan 03, 1941, MRN [id number]  PCP:  Joya Martyr, MD  Cardiologist:  Thurmon Fair, MD    Referring MD: Joya Martyr, MD   No chief complaint on file.   History of Present Illness:    Monica Hunt is a 82 y.o. female with a hx of Diabetes mellitus, chronic kidney disease (Dr. Kathrene Bongo), hypertension, hyperthyroidism history of ischemic stroke, implantable loop recorder (implanted by Dr. Elberta Fortis in February 2023, no evidence of atrial fibrillation during 19 months of follow-up).  She had several previous episodes of syncope attributed to bradycardia but have not recurred after discontinuation of her beta-blocker.  She has been on hemodialysis now for about a year.  She initially was doing dialysis at home but is now going to the dialysis center 3 days a week.  She has a right antecubital AV fistula that has excellent thrill and bruit but has not yet been successful use for dialysis.  She is receiving dialysis through a tunneled right subclavian catheter.  She has rather volatile blood pressure and if her blood pressure is low on dialysis mornings she will take midodrine.  On other days, if her blood pressure is too high she will take amlodipine 2.5 mg (generally for systolic is over 578).  This happens a few times a month.  She has not had syncope.  She is not troubled by palpitations.  She does not have lower extremity edema.  She was very upset when she told that she had congestive heart failure (she does have some evidence of diastolic dysfunction, but her decompensation occurred due to progression of her renal failure.  She is now completely anuric.  She has a history of thyrotoxicosis which has been compensated with methimazole.  She tells me that she is about to run out of her methimazole tablets and has not yet been able to get a refill on her medications (she is establishing with a new primary care  provider).  She continues to smoke and is lukewarm when we discussed smoking cessation.  She has not had problems with chest pain or shortness of breath these are at rest or with activity and she denies orthopnea, PND, claudication or new focal neurological complaints.   Additional comorbidities include diabetes mellitus with polyneuropathy and chronic kidney disease/ESRD as well as hypertension, restless leg syndrome, acid reflux, irritable bowel syndrome, hyperthyroidism on chronic methimazole.  She had a normal ECG treadmill stress test in January 2020, normal left ventricular systolic function and minimal abnormalities on the echocardiogram in June 2018.  Past Medical History:  Diagnosis Date   Anemia    Cataracts, bilateral    Chronic kidney disease    ESRD  Dialysis M/W/F   Depression    Diabetes mellitus without complication (HCC)    GERD (gastroesophageal reflux disease)    Hypertension    Hyperthyroidism    Pneumonia    Stroke (HCC)    symptom onset ~ 01/10/22, diagnosed 01/24/22 acute/subacute bilateral cerebral infarcts, largest left frontal lobe, extensive punctate chronic microhemorrages concerning for cerbral amyloid angiopathy    Past Surgical History:  Procedure Laterality Date   ABDOMINAL HYSTERECTOMY     vaginal   AV FISTULA PLACEMENT Right 05/02/2022   Procedure: RIGHT ARTERIOVENOUS (AV) FISTULA  CREATION;  Surgeon: Maeola Harman, MD;  Location: [facility] OR;  Service: Vascular;  Laterality: Right;   CARPAL TUNNEL RELEASE Right    COLONOSCOPY  WITH PROPOFOL N/A 11/17/2014   Procedure: COLONOSCOPY WITH PROPOFOL;  Surgeon: Charolett Bumpers, MD;  Location: [facility] ENDOSCOPY;  Service: Endoscopy;  Laterality: N/A;   LOOP RECORDER INSERTION N/A 01/25/2022   Procedure: LOOP RECORDER INSERTION;  Surgeon: Regan Lemming, MD;  Location: [facility] INVASIVE CV LAB;  Service: Cardiovascular;  Laterality: N/A;    Current Medications: Current Meds  Medication Sig   ACCU-CHEK  SMARTVIEW test strip    acetaminophen (TYLENOL) 500 MG tablet Take 1,000 mg by mouth every 6 (six) hours as needed for mild pain.   amLODipine (NORVASC) 2.5 MG tablet Take 1 tablet (2.5 mg total) by mouth daily.   aspirin EC 81 MG tablet Take 1 tablet (81 mg total) by mouth daily. Swallow whole.   atorvastatin (LIPITOR) 40 MG tablet Take 40 mg by mouth daily.   midodrine (PROAMATINE) 5 MG tablet Take 5 mg by mouth 3 (three) times daily as needed (if BP is low).   Multiple Minerals-Vitamins (CAL-MAG-ZINC-D) TABS Take 1 tablet by mouth daily.   Multiple Vitamin (MULTIVITAMIN WITH MINERALS) TABS tablet Take 1 tablet by mouth daily.   omeprazole (PRILOSEC) 40 MG capsule Take 40 mg by mouth daily.   rOPINIRole (REQUIP) 3 MG tablet Take 3 mg by mouth at bedtime. Take 1 tab 1 to 3 hours before bedtime   traZODone (DESYREL) 50 MG tablet Take 100 mg by mouth at bedtime.   [DISCONTINUED] methimazole (TAPAZOLE) 5 MG tablet Take 7.5 mg by mouth daily.     Allergies:   Bee venom and Wasp venom   Social History   Socioeconomic History   Marital status: Divorced    Spouse name: Not on file   Number of children: Not on file   Years of education: Not on file   Highest education level: Not on file  Occupational History   Not on file  Tobacco Use   Smoking status: Every Day    Current packs/day: 0.25    Average packs/day: 0.3 packs/day for 45.0 years (11.3 ttl pk-yrs)    Types: Cigarettes   Smokeless tobacco: Never  Vaping Use   Vaping status: Never Used  Substance and Sexual Activity   Alcohol use: No   Drug use: No   Sexual activity: Not on file  Other Topics Concern   Not on file  Social History Narrative   Not on file   Social Determinants of Health   Financial Resource Strain: Not on file  Food Insecurity: Not on file  Transportation Needs: Not on file  Physical Activity: Not on file  Stress: Not on file  Social Connections: Not on file     Family History: The patient's Family  history significant for lung cancer in her father who died when he was only 71 years old, renal cancer in her mother who died at age 87. She has a brother who died at age 64 from kidney failure. Her daughter also has hyperthyroidism. ROS:   Please see the history of present illness.    All other systems are reviewed and are negative.  EKGs/Labs/Other Studies Reviewed:   Echocardiogram 01/25/2022  1. Left ventricular ejection fraction, by estimation, is 55 to 60%. The  left ventricle has normal function. The left ventricle has no regional  wall motion abnormalities. There is mild concentric left ventricular  hypertrophy. Left ventricular diastolic  parameters are consistent with Grade I diastolic dysfunction (impaired  relaxation).   2. Right ventricular systolic function is normal. The right ventricular  size  is normal. There is normal pulmonary artery systolic pressure.   3. The mitral valve is normal in structure. Trivial mitral valve  regurgitation. No evidence of mitral stenosis.   4. Tricuspid valve regurgitation is mild to moderate.   5. The aortic valve is normal in structure. Aortic valve regurgitation is  not visualized. Aortic valve sclerosis/calcification is present, without  any evidence of aortic stenosis.   6. The inferior vena cava is normal in size with greater than 50%  respiratory variability, suggesting right atrial pressure of 3 mmHg.   Comparison(s): No significant change from prior study.   ECG treadmill stress test 01/01/2019 Blood pressure demonstrated a normal response to exercise. There was no ST segment deviation noted during stress.   Normal ETT Rare PVCls with stress and in recovery Patient only achieved 87% of PMHR    EKG:  EKG is ordered today.  Personally reviewed shows normal sinus rhythm and is a normal tracing  Recent Labs: BMET    Component Value Date/Time   NA 139 03/17/2023 2132   NA 145 (H) 12/13/2021 1558   K 4.1 03/17/2023 2132   CL  98 03/17/2023 2132   CO2 28 03/17/2023 2132   GLUCOSE 107 (H) 03/17/2023 2132   BUN 31 (H) 03/17/2023 2132   BUN 51 (H) 12/13/2021 1558   CREATININE 5.56 (H) 03/17/2023 2132   CALCIUM 9.0 03/17/2023 2132   GFRNONAA 7 (L) 03/17/2023 2132   02/04/2020  hemoglobin 11.1, creatinine 2.7 12/11/2019 TSH 0.89, A1c 5.7% 04/12/2021 Hemoglobin 10.1, creatinine 3.85  Lipid Panel     Component Value Date/Time   CHOL 146 05/29/2023 1031   TRIG 161 (H) 05/29/2023 1031   HDL 39 (L) 05/29/2023 1031   CHOLHDL 3.7 05/29/2023 1031   CHOLHDL 4.2 01/25/2022 0650   VLDL 43 (H) 01/25/2022 0650   LDLCALC 79 05/29/2023 1031   LABVLDL 28 05/29/2023 1031   05/17/2018  total cholesterol 151, HDL 30, LDL 73, triglycerides 241 24/40/1027 Cholesterol 137, HDL 29, LDL 48, triglycerides 401 (not a fasting sample) 07/03/2023 Cholesterol 141, HDL 35, LDL 84, triglycerides 119, hemoglobin A1c 5.6%, potassium 5.3, ALT 50, TSH 0.01  Physical Exam:    VS:  BP (!) 104/52 (BP Location: Left Arm, Patient Position: Sitting, Cuff Size: Normal)   Pulse 78   Ht 5\' 2"  (1.575 m)   Wt 107 lb 12.8 oz (48.9 kg)   SpO2 96%   BMI 19.72 kg/m     Wt Readings from Last 3 Encounters:  10/24/23 107 lb 12.8 oz (48.9 kg)  04/05/23 109 lb 9.6 oz (49.7 kg)  03/17/23 115 lb 4.8 oz (52.3 kg)      General: Alert, oriented x3, no distress, very lean Head: no evidence of trauma, PERRL, EOMI, no exophtalmos or lid lag, no myxedema, no xanthelasma; normal ears, nose and oropharynx Neck: normal jugular venous pulsations and no hepatojugular reflux; brisk carotid pulses without delay and no carotid bruits Chest: clear to auscultation, no signs of consolidation by percussion or palpation, normal fremitus, symmetrical and full respiratory excursions Cardiovascular: normal position and quality of the apical impulse, regular rhythm, normal first and second heart sounds, early peaking 2/6 aortic ejection murmur, no systolic murmurs,  rubs or gallops.  Right antecubital AV fistula with excellent thrill and bruit Abdomen: no tenderness or distention, no masses by palpation, no abnormal pulsatility or arterial bruits, normal bowel sounds, no hepatosplenomegaly Extremities: no clubbing, cyanosis or edema; 2+ radial, ulnar and brachial pulses bilaterally; 2+ right  femoral, posterior tibial and dorsalis pedis pulses; 2+ left femoral, posterior tibial and dorsalis pedis pulses; no subclavian or femoral bruits Neurological: grossly nonfocal Psych: Normal mood and affect     ASSESSMENT:    1. History of syncope   2. Controlled type 2 diabetes mellitus with chronic kidney disease on chronic dialysis, without long-term current use of insulin (HCC)   3. ESRD (end stage renal disease) (HCC)   4. Chronic diastolic heart failure (HCC)   5. Essential hypertension   6. Hypercholesterolemia   7. History of arterial ischemic stroke   8. Hyperthyroidism   9. Smoking     PLAN:    In order of problems listed above:  1. Syncope: Presumably related to bradycardia, has not recurred after stopping beta-blockers.  Loop recorder has not shown any evidence of severe bradycardia since implantation 9 months ago. 2. DM: Well-controlled, most recent hemoglobin A1c 5.6% without medication.  She has lost a lot of weight. 3.  ESRD: Started off with home dialysis but had to switch to ambulatory hemodialysis 3 days a week.  She is now anuric. 4.  HTN: A little volatile.  Mostly well-controlled.  Occasionally takes a low-dose of amlodipine.  Sometimes he has to actually take midodrine before dialysis. 5. CHF: She does have LVH and diastolic dysfunction on echo, but heart failure was not an issue until she developed end-stage renal disease.  She has not required any specific therapy for heart failure since starting dialysis. 6.  Hypercholesterolemia: LDL cholesterol is just slightly above target range.  Continue on atorvastatin. 7.  History of stroke:  No atrial fibrillation has been detected in over a year and a half of loop recorder monitoring.   8. Hyperthyroidism: Clinically euthyroid on methimazole therapy, although TSH was almost fully suppressed on labs in July.  Since she has been unable to get her methimazole prescription I gave her a 1 month supply today.  She tells me that the dose has been increased to 5 mg twice daily.  I refilled her previous prescription for 7.5 mg daily that should still last her for about 3 weeks. 9.  Smoking: Strongly advised cessation.    Medication Adjustments/Labs and Tests Ordered: Current medicines are reviewed at length with the patient today.  Concerns regarding medicines are outlined above. Labs and tests ordered and medication changes are outlined in the patient instructions below:  Patient Instructions  Medication Instructions:  No changes *If you need a refill on your cardiac medications before your next appointment, please call your pharmacy*  Follow-Up: At [facility], you and your health needs are our priority.  As part of our continuing mission to provide you with exceptional heart care, we have created designated Provider Care Teams.  These Care Teams include your primary Cardiologist (physician) and Advanced Practice Providers (APPs -  Physician Assistants and Nurse Practitioners) who all work together to provide you with the care you need, when you need it.  We recommend signing up for the patient portal called "MyChart".  Sign up information is provided on this After Visit Summary.  MyChart is used to connect with patients for Virtual Visits (Telemedicine).  Patients are able to view lab/test results, encounter notes, upcoming appointments, etc.  Non-urgent messages can be sent to your provider as well.   To learn more about what you can do with MyChart, go to [email or web address].    Your next appointment:   1 year(s)  Provider:   Thurmon Fair, MD  Signed, Thurmon Fair, MD  10/25/2023 4:55 PM    [facility] .

## 2023-10-24 NOTE — Patient Instructions (Signed)

## 2023-10-25 ENCOUNTER — Encounter: Payer: Self-pay | Admitting: Cardiovascular Disease

## 2023-10-25 DIAGNOSIS — Z992 Dependence on renal dialysis: Secondary | ICD-10-CM | POA: Diagnosis not present

## 2023-10-25 DIAGNOSIS — N186 End stage renal disease: Secondary | ICD-10-CM | POA: Diagnosis not present

## 2023-10-25 DIAGNOSIS — I129 Hypertensive chronic kidney disease with stage 1 through stage 4 chronic kidney disease, or unspecified chronic kidney disease: Secondary | ICD-10-CM | POA: Diagnosis not present

## 2023-10-26 DIAGNOSIS — E1122 Type 2 diabetes mellitus with diabetic chronic kidney disease: Secondary | ICD-10-CM | POA: Diagnosis not present

## 2023-10-26 DIAGNOSIS — Z992 Dependence on renal dialysis: Secondary | ICD-10-CM | POA: Diagnosis not present

## 2023-10-26 DIAGNOSIS — D631 Anemia in chronic kidney disease: Secondary | ICD-10-CM | POA: Diagnosis not present

## 2023-10-26 DIAGNOSIS — D509 Iron deficiency anemia, unspecified: Secondary | ICD-10-CM | POA: Diagnosis not present

## 2023-10-26 DIAGNOSIS — N2581 Secondary hyperparathyroidism of renal origin: Secondary | ICD-10-CM | POA: Diagnosis not present

## 2023-10-26 DIAGNOSIS — N186 End stage renal disease: Secondary | ICD-10-CM | POA: Diagnosis not present

## 2023-10-26 LAB — CUP PACEART REMOTE DEVICE CHECK
Date Time Interrogation Session: 20241030000408
Implantable Pulse Generator Implant Date: 20230201

## 2023-10-28 ENCOUNTER — Other Ambulatory Visit: Payer: Self-pay | Admitting: Cardiovascular Disease

## 2023-10-29 ENCOUNTER — Ambulatory Visit (INDEPENDENT_AMBULATORY_CARE_PROVIDER_SITE_OTHER): Payer: Medicare Other

## 2023-10-29 DIAGNOSIS — E1122 Type 2 diabetes mellitus with diabetic chronic kidney disease: Secondary | ICD-10-CM | POA: Diagnosis not present

## 2023-10-29 DIAGNOSIS — I639 Cerebral infarction, unspecified: Secondary | ICD-10-CM | POA: Diagnosis not present

## 2023-10-29 DIAGNOSIS — N2581 Secondary hyperparathyroidism of renal origin: Secondary | ICD-10-CM | POA: Diagnosis not present

## 2023-10-29 DIAGNOSIS — D509 Iron deficiency anemia, unspecified: Secondary | ICD-10-CM | POA: Diagnosis not present

## 2023-10-29 DIAGNOSIS — D631 Anemia in chronic kidney disease: Secondary | ICD-10-CM | POA: Diagnosis not present

## 2023-10-29 DIAGNOSIS — Z992 Dependence on renal dialysis: Secondary | ICD-10-CM | POA: Diagnosis not present

## 2023-10-29 DIAGNOSIS — N186 End stage renal disease: Secondary | ICD-10-CM | POA: Diagnosis not present

## 2023-10-30 ENCOUNTER — Other Ambulatory Visit: Payer: Self-pay

## 2023-10-31 DIAGNOSIS — E1122 Type 2 diabetes mellitus with diabetic chronic kidney disease: Secondary | ICD-10-CM | POA: Diagnosis not present

## 2023-10-31 DIAGNOSIS — N2581 Secondary hyperparathyroidism of renal origin: Secondary | ICD-10-CM | POA: Diagnosis not present

## 2023-10-31 DIAGNOSIS — N186 End stage renal disease: Secondary | ICD-10-CM | POA: Diagnosis not present

## 2023-10-31 DIAGNOSIS — D631 Anemia in chronic kidney disease: Secondary | ICD-10-CM | POA: Diagnosis not present

## 2023-10-31 DIAGNOSIS — D509 Iron deficiency anemia, unspecified: Secondary | ICD-10-CM | POA: Diagnosis not present

## 2023-10-31 DIAGNOSIS — Z992 Dependence on renal dialysis: Secondary | ICD-10-CM | POA: Diagnosis not present

## 2023-11-02 DIAGNOSIS — D509 Iron deficiency anemia, unspecified: Secondary | ICD-10-CM | POA: Diagnosis not present

## 2023-11-02 DIAGNOSIS — Z992 Dependence on renal dialysis: Secondary | ICD-10-CM | POA: Diagnosis not present

## 2023-11-02 DIAGNOSIS — E1122 Type 2 diabetes mellitus with diabetic chronic kidney disease: Secondary | ICD-10-CM | POA: Diagnosis not present

## 2023-11-02 DIAGNOSIS — D631 Anemia in chronic kidney disease: Secondary | ICD-10-CM | POA: Diagnosis not present

## 2023-11-02 DIAGNOSIS — N186 End stage renal disease: Secondary | ICD-10-CM | POA: Diagnosis not present

## 2023-11-02 DIAGNOSIS — N2581 Secondary hyperparathyroidism of renal origin: Secondary | ICD-10-CM | POA: Diagnosis not present

## 2023-11-05 DIAGNOSIS — D509 Iron deficiency anemia, unspecified: Secondary | ICD-10-CM | POA: Diagnosis not present

## 2023-11-05 DIAGNOSIS — D631 Anemia in chronic kidney disease: Secondary | ICD-10-CM | POA: Diagnosis not present

## 2023-11-05 DIAGNOSIS — N2581 Secondary hyperparathyroidism of renal origin: Secondary | ICD-10-CM | POA: Diagnosis not present

## 2023-11-05 DIAGNOSIS — N186 End stage renal disease: Secondary | ICD-10-CM | POA: Diagnosis not present

## 2023-11-05 DIAGNOSIS — E1122 Type 2 diabetes mellitus with diabetic chronic kidney disease: Secondary | ICD-10-CM | POA: Diagnosis not present

## 2023-11-05 DIAGNOSIS — Z992 Dependence on renal dialysis: Secondary | ICD-10-CM | POA: Diagnosis not present

## 2023-11-07 DIAGNOSIS — D631 Anemia in chronic kidney disease: Secondary | ICD-10-CM | POA: Diagnosis not present

## 2023-11-07 DIAGNOSIS — D509 Iron deficiency anemia, unspecified: Secondary | ICD-10-CM | POA: Diagnosis not present

## 2023-11-07 DIAGNOSIS — E1122 Type 2 diabetes mellitus with diabetic chronic kidney disease: Secondary | ICD-10-CM | POA: Diagnosis not present

## 2023-11-07 DIAGNOSIS — Z992 Dependence on renal dialysis: Secondary | ICD-10-CM | POA: Diagnosis not present

## 2023-11-07 DIAGNOSIS — N2581 Secondary hyperparathyroidism of renal origin: Secondary | ICD-10-CM | POA: Diagnosis not present

## 2023-11-07 DIAGNOSIS — N186 End stage renal disease: Secondary | ICD-10-CM | POA: Diagnosis not present

## 2023-11-09 DIAGNOSIS — N186 End stage renal disease: Secondary | ICD-10-CM | POA: Diagnosis not present

## 2023-11-09 DIAGNOSIS — E1122 Type 2 diabetes mellitus with diabetic chronic kidney disease: Secondary | ICD-10-CM | POA: Diagnosis not present

## 2023-11-09 DIAGNOSIS — D631 Anemia in chronic kidney disease: Secondary | ICD-10-CM | POA: Diagnosis not present

## 2023-11-09 DIAGNOSIS — N2581 Secondary hyperparathyroidism of renal origin: Secondary | ICD-10-CM | POA: Diagnosis not present

## 2023-11-09 DIAGNOSIS — D509 Iron deficiency anemia, unspecified: Secondary | ICD-10-CM | POA: Diagnosis not present

## 2023-11-09 DIAGNOSIS — Z992 Dependence on renal dialysis: Secondary | ICD-10-CM | POA: Diagnosis not present

## 2023-11-12 DIAGNOSIS — E1122 Type 2 diabetes mellitus with diabetic chronic kidney disease: Secondary | ICD-10-CM | POA: Diagnosis not present

## 2023-11-12 DIAGNOSIS — D631 Anemia in chronic kidney disease: Secondary | ICD-10-CM | POA: Diagnosis not present

## 2023-11-12 DIAGNOSIS — Z992 Dependence on renal dialysis: Secondary | ICD-10-CM | POA: Diagnosis not present

## 2023-11-12 DIAGNOSIS — N186 End stage renal disease: Secondary | ICD-10-CM | POA: Diagnosis not present

## 2023-11-12 DIAGNOSIS — D509 Iron deficiency anemia, unspecified: Secondary | ICD-10-CM | POA: Diagnosis not present

## 2023-11-12 DIAGNOSIS — N2581 Secondary hyperparathyroidism of renal origin: Secondary | ICD-10-CM | POA: Diagnosis not present

## 2023-11-14 DIAGNOSIS — D509 Iron deficiency anemia, unspecified: Secondary | ICD-10-CM | POA: Diagnosis not present

## 2023-11-14 DIAGNOSIS — E1122 Type 2 diabetes mellitus with diabetic chronic kidney disease: Secondary | ICD-10-CM | POA: Diagnosis not present

## 2023-11-14 DIAGNOSIS — Z992 Dependence on renal dialysis: Secondary | ICD-10-CM | POA: Diagnosis not present

## 2023-11-14 DIAGNOSIS — D631 Anemia in chronic kidney disease: Secondary | ICD-10-CM | POA: Diagnosis not present

## 2023-11-14 DIAGNOSIS — N2581 Secondary hyperparathyroidism of renal origin: Secondary | ICD-10-CM | POA: Diagnosis not present

## 2023-11-14 DIAGNOSIS — N186 End stage renal disease: Secondary | ICD-10-CM | POA: Diagnosis not present

## 2023-11-16 DIAGNOSIS — E1122 Type 2 diabetes mellitus with diabetic chronic kidney disease: Secondary | ICD-10-CM | POA: Diagnosis not present

## 2023-11-16 DIAGNOSIS — D631 Anemia in chronic kidney disease: Secondary | ICD-10-CM | POA: Diagnosis not present

## 2023-11-16 DIAGNOSIS — N186 End stage renal disease: Secondary | ICD-10-CM | POA: Diagnosis not present

## 2023-11-16 DIAGNOSIS — Z992 Dependence on renal dialysis: Secondary | ICD-10-CM | POA: Diagnosis not present

## 2023-11-16 DIAGNOSIS — N2581 Secondary hyperparathyroidism of renal origin: Secondary | ICD-10-CM | POA: Diagnosis not present

## 2023-11-16 DIAGNOSIS — D509 Iron deficiency anemia, unspecified: Secondary | ICD-10-CM | POA: Diagnosis not present

## 2023-11-19 DIAGNOSIS — D509 Iron deficiency anemia, unspecified: Secondary | ICD-10-CM | POA: Diagnosis not present

## 2023-11-19 DIAGNOSIS — E1122 Type 2 diabetes mellitus with diabetic chronic kidney disease: Secondary | ICD-10-CM | POA: Diagnosis not present

## 2023-11-19 DIAGNOSIS — D631 Anemia in chronic kidney disease: Secondary | ICD-10-CM | POA: Diagnosis not present

## 2023-11-19 DIAGNOSIS — N186 End stage renal disease: Secondary | ICD-10-CM | POA: Diagnosis not present

## 2023-11-19 DIAGNOSIS — Z992 Dependence on renal dialysis: Secondary | ICD-10-CM | POA: Diagnosis not present

## 2023-11-19 DIAGNOSIS — N2581 Secondary hyperparathyroidism of renal origin: Secondary | ICD-10-CM | POA: Diagnosis not present

## 2023-11-20 NOTE — Progress Notes (Signed)
[facility] Summary Report / Loop Recorder 

## 2023-11-21 DIAGNOSIS — D509 Iron deficiency anemia, unspecified: Secondary | ICD-10-CM | POA: Diagnosis not present

## 2023-11-21 DIAGNOSIS — N2581 Secondary hyperparathyroidism of renal origin: Secondary | ICD-10-CM | POA: Diagnosis not present

## 2023-11-21 DIAGNOSIS — Z992 Dependence on renal dialysis: Secondary | ICD-10-CM | POA: Diagnosis not present

## 2023-11-21 DIAGNOSIS — D631 Anemia in chronic kidney disease: Secondary | ICD-10-CM | POA: Diagnosis not present

## 2023-11-21 DIAGNOSIS — N186 End stage renal disease: Secondary | ICD-10-CM | POA: Diagnosis not present

## 2023-11-21 DIAGNOSIS — E1122 Type 2 diabetes mellitus with diabetic chronic kidney disease: Secondary | ICD-10-CM | POA: Diagnosis not present

## 2023-11-23 DIAGNOSIS — D509 Iron deficiency anemia, unspecified: Secondary | ICD-10-CM | POA: Diagnosis not present

## 2023-11-23 DIAGNOSIS — Z992 Dependence on renal dialysis: Secondary | ICD-10-CM | POA: Diagnosis not present

## 2023-11-23 DIAGNOSIS — D631 Anemia in chronic kidney disease: Secondary | ICD-10-CM | POA: Diagnosis not present

## 2023-11-23 DIAGNOSIS — E1122 Type 2 diabetes mellitus with diabetic chronic kidney disease: Secondary | ICD-10-CM | POA: Diagnosis not present

## 2023-11-23 DIAGNOSIS — N2581 Secondary hyperparathyroidism of renal origin: Secondary | ICD-10-CM | POA: Diagnosis not present

## 2023-11-23 DIAGNOSIS — N186 End stage renal disease: Secondary | ICD-10-CM | POA: Diagnosis not present

## 2023-11-24 DIAGNOSIS — Z992 Dependence on renal dialysis: Secondary | ICD-10-CM | POA: Diagnosis not present

## 2023-11-24 DIAGNOSIS — N186 End stage renal disease: Secondary | ICD-10-CM | POA: Diagnosis not present

## 2023-11-24 DIAGNOSIS — I129 Hypertensive chronic kidney disease with stage 1 through stage 4 chronic kidney disease, or unspecified chronic kidney disease: Secondary | ICD-10-CM | POA: Diagnosis not present

## 2023-11-26 DIAGNOSIS — D509 Iron deficiency anemia, unspecified: Secondary | ICD-10-CM | POA: Diagnosis not present

## 2023-11-26 DIAGNOSIS — Z992 Dependence on renal dialysis: Secondary | ICD-10-CM | POA: Diagnosis not present

## 2023-11-26 DIAGNOSIS — N186 End stage renal disease: Secondary | ICD-10-CM | POA: Diagnosis not present

## 2023-11-26 DIAGNOSIS — N2581 Secondary hyperparathyroidism of renal origin: Secondary | ICD-10-CM | POA: Diagnosis not present

## 2023-11-26 DIAGNOSIS — D631 Anemia in chronic kidney disease: Secondary | ICD-10-CM | POA: Diagnosis not present

## 2023-11-28 DIAGNOSIS — D631 Anemia in chronic kidney disease: Secondary | ICD-10-CM | POA: Diagnosis not present

## 2023-11-28 DIAGNOSIS — N186 End stage renal disease: Secondary | ICD-10-CM | POA: Diagnosis not present

## 2023-11-28 DIAGNOSIS — D509 Iron deficiency anemia, unspecified: Secondary | ICD-10-CM | POA: Diagnosis not present

## 2023-11-28 DIAGNOSIS — Z992 Dependence on renal dialysis: Secondary | ICD-10-CM | POA: Diagnosis not present

## 2023-11-28 DIAGNOSIS — N2581 Secondary hyperparathyroidism of renal origin: Secondary | ICD-10-CM | POA: Diagnosis not present

## 2023-11-29 DIAGNOSIS — E1122 Type 2 diabetes mellitus with diabetic chronic kidney disease: Secondary | ICD-10-CM | POA: Diagnosis not present

## 2023-11-29 DIAGNOSIS — I10 Essential (primary) hypertension: Secondary | ICD-10-CM | POA: Diagnosis not present

## 2023-11-29 DIAGNOSIS — I120 Hypertensive chronic kidney disease with stage 5 chronic kidney disease or end stage renal disease: Secondary | ICD-10-CM | POA: Diagnosis not present

## 2023-11-29 DIAGNOSIS — E0590 Thyrotoxicosis, unspecified without thyrotoxic crisis or storm: Secondary | ICD-10-CM | POA: Diagnosis not present

## 2023-11-29 DIAGNOSIS — Z992 Dependence on renal dialysis: Secondary | ICD-10-CM | POA: Diagnosis not present

## 2023-11-30 DIAGNOSIS — Z992 Dependence on renal dialysis: Secondary | ICD-10-CM | POA: Diagnosis not present

## 2023-11-30 DIAGNOSIS — D631 Anemia in chronic kidney disease: Secondary | ICD-10-CM | POA: Diagnosis not present

## 2023-11-30 DIAGNOSIS — D509 Iron deficiency anemia, unspecified: Secondary | ICD-10-CM | POA: Diagnosis not present

## 2023-11-30 DIAGNOSIS — N2581 Secondary hyperparathyroidism of renal origin: Secondary | ICD-10-CM | POA: Diagnosis not present

## 2023-11-30 DIAGNOSIS — N186 End stage renal disease: Secondary | ICD-10-CM | POA: Diagnosis not present

## 2023-12-03 ENCOUNTER — Ambulatory Visit: Payer: Medicare Other

## 2023-12-03 DIAGNOSIS — I639 Cerebral infarction, unspecified: Secondary | ICD-10-CM | POA: Diagnosis not present

## 2023-12-03 DIAGNOSIS — N2581 Secondary hyperparathyroidism of renal origin: Secondary | ICD-10-CM | POA: Diagnosis not present

## 2023-12-03 DIAGNOSIS — D631 Anemia in chronic kidney disease: Secondary | ICD-10-CM | POA: Diagnosis not present

## 2023-12-03 DIAGNOSIS — Z992 Dependence on renal dialysis: Secondary | ICD-10-CM | POA: Diagnosis not present

## 2023-12-03 DIAGNOSIS — D509 Iron deficiency anemia, unspecified: Secondary | ICD-10-CM | POA: Diagnosis not present

## 2023-12-03 DIAGNOSIS — N186 End stage renal disease: Secondary | ICD-10-CM | POA: Diagnosis not present

## 2023-12-03 LAB — CUP PACEART REMOTE DEVICE CHECK
Date Time Interrogation Session: 20241207040320
Implantable Pulse Generator Implant Date: 20230201
Zone Setting Status: 755011
Zone Setting Status: 755011

## 2023-12-05 DIAGNOSIS — Z992 Dependence on renal dialysis: Secondary | ICD-10-CM | POA: Diagnosis not present

## 2023-12-05 DIAGNOSIS — D509 Iron deficiency anemia, unspecified: Secondary | ICD-10-CM | POA: Diagnosis not present

## 2023-12-05 DIAGNOSIS — N186 End stage renal disease: Secondary | ICD-10-CM | POA: Diagnosis not present

## 2023-12-05 DIAGNOSIS — N2581 Secondary hyperparathyroidism of renal origin: Secondary | ICD-10-CM | POA: Diagnosis not present

## 2023-12-05 DIAGNOSIS — D631 Anemia in chronic kidney disease: Secondary | ICD-10-CM | POA: Diagnosis not present

## 2023-12-07 DIAGNOSIS — D509 Iron deficiency anemia, unspecified: Secondary | ICD-10-CM | POA: Diagnosis not present

## 2023-12-07 DIAGNOSIS — N186 End stage renal disease: Secondary | ICD-10-CM | POA: Diagnosis not present

## 2023-12-07 DIAGNOSIS — Z992 Dependence on renal dialysis: Secondary | ICD-10-CM | POA: Diagnosis not present

## 2023-12-07 DIAGNOSIS — D631 Anemia in chronic kidney disease: Secondary | ICD-10-CM | POA: Diagnosis not present

## 2023-12-07 DIAGNOSIS — N2581 Secondary hyperparathyroidism of renal origin: Secondary | ICD-10-CM | POA: Diagnosis not present

## 2023-12-10 DIAGNOSIS — Z992 Dependence on renal dialysis: Secondary | ICD-10-CM | POA: Diagnosis not present

## 2023-12-10 DIAGNOSIS — D631 Anemia in chronic kidney disease: Secondary | ICD-10-CM | POA: Diagnosis not present

## 2023-12-10 DIAGNOSIS — D509 Iron deficiency anemia, unspecified: Secondary | ICD-10-CM | POA: Diagnosis not present

## 2023-12-10 DIAGNOSIS — N2581 Secondary hyperparathyroidism of renal origin: Secondary | ICD-10-CM | POA: Diagnosis not present

## 2023-12-10 DIAGNOSIS — N186 End stage renal disease: Secondary | ICD-10-CM | POA: Diagnosis not present

## 2023-12-12 DIAGNOSIS — N2581 Secondary hyperparathyroidism of renal origin: Secondary | ICD-10-CM | POA: Diagnosis not present

## 2023-12-12 DIAGNOSIS — N186 End stage renal disease: Secondary | ICD-10-CM | POA: Diagnosis not present

## 2023-12-12 DIAGNOSIS — Z992 Dependence on renal dialysis: Secondary | ICD-10-CM | POA: Diagnosis not present

## 2023-12-12 DIAGNOSIS — D509 Iron deficiency anemia, unspecified: Secondary | ICD-10-CM | POA: Diagnosis not present

## 2023-12-12 DIAGNOSIS — D631 Anemia in chronic kidney disease: Secondary | ICD-10-CM | POA: Diagnosis not present

## 2023-12-14 DIAGNOSIS — Z992 Dependence on renal dialysis: Secondary | ICD-10-CM | POA: Diagnosis not present

## 2023-12-14 DIAGNOSIS — D631 Anemia in chronic kidney disease: Secondary | ICD-10-CM | POA: Diagnosis not present

## 2023-12-14 DIAGNOSIS — D509 Iron deficiency anemia, unspecified: Secondary | ICD-10-CM | POA: Diagnosis not present

## 2023-12-14 DIAGNOSIS — N2581 Secondary hyperparathyroidism of renal origin: Secondary | ICD-10-CM | POA: Diagnosis not present

## 2023-12-14 DIAGNOSIS — N186 End stage renal disease: Secondary | ICD-10-CM | POA: Diagnosis not present

## 2023-12-16 DIAGNOSIS — Z992 Dependence on renal dialysis: Secondary | ICD-10-CM | POA: Diagnosis not present

## 2023-12-16 DIAGNOSIS — D509 Iron deficiency anemia, unspecified: Secondary | ICD-10-CM | POA: Diagnosis not present

## 2023-12-16 DIAGNOSIS — D631 Anemia in chronic kidney disease: Secondary | ICD-10-CM | POA: Diagnosis not present

## 2023-12-16 DIAGNOSIS — N186 End stage renal disease: Secondary | ICD-10-CM | POA: Diagnosis not present

## 2023-12-16 DIAGNOSIS — N2581 Secondary hyperparathyroidism of renal origin: Secondary | ICD-10-CM | POA: Diagnosis not present

## 2023-12-18 DIAGNOSIS — D509 Iron deficiency anemia, unspecified: Secondary | ICD-10-CM | POA: Diagnosis not present

## 2023-12-18 DIAGNOSIS — N2581 Secondary hyperparathyroidism of renal origin: Secondary | ICD-10-CM | POA: Diagnosis not present

## 2023-12-18 DIAGNOSIS — D631 Anemia in chronic kidney disease: Secondary | ICD-10-CM | POA: Diagnosis not present

## 2023-12-18 DIAGNOSIS — Z992 Dependence on renal dialysis: Secondary | ICD-10-CM | POA: Diagnosis not present

## 2023-12-18 DIAGNOSIS — N186 End stage renal disease: Secondary | ICD-10-CM | POA: Diagnosis not present

## 2023-12-21 DIAGNOSIS — D631 Anemia in chronic kidney disease: Secondary | ICD-10-CM | POA: Diagnosis not present

## 2023-12-21 DIAGNOSIS — Z992 Dependence on renal dialysis: Secondary | ICD-10-CM | POA: Diagnosis not present

## 2023-12-21 DIAGNOSIS — N2581 Secondary hyperparathyroidism of renal origin: Secondary | ICD-10-CM | POA: Diagnosis not present

## 2023-12-21 DIAGNOSIS — D509 Iron deficiency anemia, unspecified: Secondary | ICD-10-CM | POA: Diagnosis not present

## 2023-12-21 DIAGNOSIS — N186 End stage renal disease: Secondary | ICD-10-CM | POA: Diagnosis not present

## 2023-12-23 DIAGNOSIS — N186 End stage renal disease: Secondary | ICD-10-CM | POA: Diagnosis not present

## 2023-12-23 DIAGNOSIS — D509 Iron deficiency anemia, unspecified: Secondary | ICD-10-CM | POA: Diagnosis not present

## 2023-12-23 DIAGNOSIS — N2581 Secondary hyperparathyroidism of renal origin: Secondary | ICD-10-CM | POA: Diagnosis not present

## 2023-12-23 DIAGNOSIS — D631 Anemia in chronic kidney disease: Secondary | ICD-10-CM | POA: Diagnosis not present

## 2023-12-23 DIAGNOSIS — Z992 Dependence on renal dialysis: Secondary | ICD-10-CM | POA: Diagnosis not present

## 2023-12-25 DIAGNOSIS — D631 Anemia in chronic kidney disease: Secondary | ICD-10-CM | POA: Diagnosis not present

## 2023-12-25 DIAGNOSIS — D509 Iron deficiency anemia, unspecified: Secondary | ICD-10-CM | POA: Diagnosis not present

## 2023-12-25 DIAGNOSIS — Z992 Dependence on renal dialysis: Secondary | ICD-10-CM | POA: Diagnosis not present

## 2023-12-25 DIAGNOSIS — N2581 Secondary hyperparathyroidism of renal origin: Secondary | ICD-10-CM | POA: Diagnosis not present

## 2023-12-25 DIAGNOSIS — N186 End stage renal disease: Secondary | ICD-10-CM | POA: Diagnosis not present

## 2023-12-25 DIAGNOSIS — I129 Hypertensive chronic kidney disease with stage 1 through stage 4 chronic kidney disease, or unspecified chronic kidney disease: Secondary | ICD-10-CM | POA: Diagnosis not present

## 2023-12-28 DIAGNOSIS — N2581 Secondary hyperparathyroidism of renal origin: Secondary | ICD-10-CM | POA: Diagnosis not present

## 2023-12-28 DIAGNOSIS — D631 Anemia in chronic kidney disease: Secondary | ICD-10-CM | POA: Diagnosis not present

## 2023-12-28 DIAGNOSIS — N186 End stage renal disease: Secondary | ICD-10-CM | POA: Diagnosis not present

## 2023-12-28 DIAGNOSIS — D509 Iron deficiency anemia, unspecified: Secondary | ICD-10-CM | POA: Diagnosis not present

## 2023-12-28 DIAGNOSIS — Z992 Dependence on renal dialysis: Secondary | ICD-10-CM | POA: Diagnosis not present

## 2023-12-31 DIAGNOSIS — N2581 Secondary hyperparathyroidism of renal origin: Secondary | ICD-10-CM | POA: Diagnosis not present

## 2023-12-31 DIAGNOSIS — D631 Anemia in chronic kidney disease: Secondary | ICD-10-CM | POA: Diagnosis not present

## 2023-12-31 DIAGNOSIS — N186 End stage renal disease: Secondary | ICD-10-CM | POA: Diagnosis not present

## 2023-12-31 DIAGNOSIS — Z992 Dependence on renal dialysis: Secondary | ICD-10-CM | POA: Diagnosis not present

## 2023-12-31 DIAGNOSIS — D509 Iron deficiency anemia, unspecified: Secondary | ICD-10-CM | POA: Diagnosis not present

## 2024-01-02 DIAGNOSIS — Z992 Dependence on renal dialysis: Secondary | ICD-10-CM | POA: Diagnosis not present

## 2024-01-02 DIAGNOSIS — D631 Anemia in chronic kidney disease: Secondary | ICD-10-CM | POA: Diagnosis not present

## 2024-01-02 DIAGNOSIS — N186 End stage renal disease: Secondary | ICD-10-CM | POA: Diagnosis not present

## 2024-01-02 DIAGNOSIS — D509 Iron deficiency anemia, unspecified: Secondary | ICD-10-CM | POA: Diagnosis not present

## 2024-01-02 DIAGNOSIS — N2581 Secondary hyperparathyroidism of renal origin: Secondary | ICD-10-CM | POA: Diagnosis not present

## 2024-01-04 DIAGNOSIS — D631 Anemia in chronic kidney disease: Secondary | ICD-10-CM | POA: Diagnosis not present

## 2024-01-04 DIAGNOSIS — N186 End stage renal disease: Secondary | ICD-10-CM | POA: Diagnosis not present

## 2024-01-04 DIAGNOSIS — N2581 Secondary hyperparathyroidism of renal origin: Secondary | ICD-10-CM | POA: Diagnosis not present

## 2024-01-04 DIAGNOSIS — Z992 Dependence on renal dialysis: Secondary | ICD-10-CM | POA: Diagnosis not present

## 2024-01-04 DIAGNOSIS — D509 Iron deficiency anemia, unspecified: Secondary | ICD-10-CM | POA: Diagnosis not present

## 2024-01-07 ENCOUNTER — Ambulatory Visit (INDEPENDENT_AMBULATORY_CARE_PROVIDER_SITE_OTHER): Payer: Medicare Other

## 2024-01-07 DIAGNOSIS — N2581 Secondary hyperparathyroidism of renal origin: Secondary | ICD-10-CM | POA: Diagnosis not present

## 2024-01-07 DIAGNOSIS — Z992 Dependence on renal dialysis: Secondary | ICD-10-CM | POA: Diagnosis not present

## 2024-01-07 DIAGNOSIS — I639 Cerebral infarction, unspecified: Secondary | ICD-10-CM

## 2024-01-07 DIAGNOSIS — N186 End stage renal disease: Secondary | ICD-10-CM | POA: Diagnosis not present

## 2024-01-07 DIAGNOSIS — D631 Anemia in chronic kidney disease: Secondary | ICD-10-CM | POA: Diagnosis not present

## 2024-01-07 DIAGNOSIS — D509 Iron deficiency anemia, unspecified: Secondary | ICD-10-CM | POA: Diagnosis not present

## 2024-01-07 LAB — CUP PACEART REMOTE DEVICE CHECK
Date Time Interrogation Session: 20250113000647
Implantable Pulse Generator Implant Date: 20230201

## 2024-01-08 DIAGNOSIS — D631 Anemia in chronic kidney disease: Secondary | ICD-10-CM | POA: Diagnosis not present

## 2024-01-08 DIAGNOSIS — D509 Iron deficiency anemia, unspecified: Secondary | ICD-10-CM | POA: Diagnosis not present

## 2024-01-08 DIAGNOSIS — Z992 Dependence on renal dialysis: Secondary | ICD-10-CM | POA: Diagnosis not present

## 2024-01-08 DIAGNOSIS — N186 End stage renal disease: Secondary | ICD-10-CM | POA: Diagnosis not present

## 2024-01-08 DIAGNOSIS — N2581 Secondary hyperparathyroidism of renal origin: Secondary | ICD-10-CM | POA: Diagnosis not present

## 2024-01-09 DIAGNOSIS — D631 Anemia in chronic kidney disease: Secondary | ICD-10-CM | POA: Diagnosis not present

## 2024-01-09 DIAGNOSIS — D509 Iron deficiency anemia, unspecified: Secondary | ICD-10-CM | POA: Diagnosis not present

## 2024-01-09 DIAGNOSIS — N186 End stage renal disease: Secondary | ICD-10-CM | POA: Diagnosis not present

## 2024-01-09 DIAGNOSIS — N2581 Secondary hyperparathyroidism of renal origin: Secondary | ICD-10-CM | POA: Diagnosis not present

## 2024-01-09 DIAGNOSIS — Z992 Dependence on renal dialysis: Secondary | ICD-10-CM | POA: Diagnosis not present

## 2024-01-10 DIAGNOSIS — E0590 Thyrotoxicosis, unspecified without thyrotoxic crisis or storm: Secondary | ICD-10-CM | POA: Diagnosis not present

## 2024-01-10 NOTE — Progress Notes (Signed)
[facility] Summary Report / Loop Recorder 

## 2024-01-11 DIAGNOSIS — N2581 Secondary hyperparathyroidism of renal origin: Secondary | ICD-10-CM | POA: Diagnosis not present

## 2024-01-11 DIAGNOSIS — D631 Anemia in chronic kidney disease: Secondary | ICD-10-CM | POA: Diagnosis not present

## 2024-01-11 DIAGNOSIS — D509 Iron deficiency anemia, unspecified: Secondary | ICD-10-CM | POA: Diagnosis not present

## 2024-01-11 DIAGNOSIS — N186 End stage renal disease: Secondary | ICD-10-CM | POA: Diagnosis not present

## 2024-01-11 DIAGNOSIS — Z992 Dependence on renal dialysis: Secondary | ICD-10-CM | POA: Diagnosis not present

## 2024-01-14 DIAGNOSIS — N2581 Secondary hyperparathyroidism of renal origin: Secondary | ICD-10-CM | POA: Diagnosis not present

## 2024-01-14 DIAGNOSIS — Z992 Dependence on renal dialysis: Secondary | ICD-10-CM | POA: Diagnosis not present

## 2024-01-14 DIAGNOSIS — D509 Iron deficiency anemia, unspecified: Secondary | ICD-10-CM | POA: Diagnosis not present

## 2024-01-14 DIAGNOSIS — N186 End stage renal disease: Secondary | ICD-10-CM | POA: Diagnosis not present

## 2024-01-14 DIAGNOSIS — D631 Anemia in chronic kidney disease: Secondary | ICD-10-CM | POA: Diagnosis not present

## 2024-01-17 DIAGNOSIS — D509 Iron deficiency anemia, unspecified: Secondary | ICD-10-CM | POA: Diagnosis not present

## 2024-01-17 DIAGNOSIS — Z992 Dependence on renal dialysis: Secondary | ICD-10-CM | POA: Diagnosis not present

## 2024-01-17 DIAGNOSIS — D631 Anemia in chronic kidney disease: Secondary | ICD-10-CM | POA: Diagnosis not present

## 2024-01-17 DIAGNOSIS — N2581 Secondary hyperparathyroidism of renal origin: Secondary | ICD-10-CM | POA: Diagnosis not present

## 2024-01-17 DIAGNOSIS — N186 End stage renal disease: Secondary | ICD-10-CM | POA: Diagnosis not present

## 2024-01-18 DIAGNOSIS — N186 End stage renal disease: Secondary | ICD-10-CM | POA: Diagnosis not present

## 2024-01-18 DIAGNOSIS — N2581 Secondary hyperparathyroidism of renal origin: Secondary | ICD-10-CM | POA: Diagnosis not present

## 2024-01-18 DIAGNOSIS — D631 Anemia in chronic kidney disease: Secondary | ICD-10-CM | POA: Diagnosis not present

## 2024-01-18 DIAGNOSIS — D509 Iron deficiency anemia, unspecified: Secondary | ICD-10-CM | POA: Diagnosis not present

## 2024-01-18 DIAGNOSIS — Z992 Dependence on renal dialysis: Secondary | ICD-10-CM | POA: Diagnosis not present

## 2024-01-21 DIAGNOSIS — N186 End stage renal disease: Secondary | ICD-10-CM | POA: Diagnosis not present

## 2024-01-21 DIAGNOSIS — D631 Anemia in chronic kidney disease: Secondary | ICD-10-CM | POA: Diagnosis not present

## 2024-01-21 DIAGNOSIS — D509 Iron deficiency anemia, unspecified: Secondary | ICD-10-CM | POA: Diagnosis not present

## 2024-01-21 DIAGNOSIS — Z992 Dependence on renal dialysis: Secondary | ICD-10-CM | POA: Diagnosis not present

## 2024-01-21 DIAGNOSIS — N2581 Secondary hyperparathyroidism of renal origin: Secondary | ICD-10-CM | POA: Diagnosis not present

## 2024-01-23 DIAGNOSIS — N2581 Secondary hyperparathyroidism of renal origin: Secondary | ICD-10-CM | POA: Diagnosis not present

## 2024-01-23 DIAGNOSIS — D631 Anemia in chronic kidney disease: Secondary | ICD-10-CM | POA: Diagnosis not present

## 2024-01-23 DIAGNOSIS — D509 Iron deficiency anemia, unspecified: Secondary | ICD-10-CM | POA: Diagnosis not present

## 2024-01-23 DIAGNOSIS — Z992 Dependence on renal dialysis: Secondary | ICD-10-CM | POA: Diagnosis not present

## 2024-01-23 DIAGNOSIS — N186 End stage renal disease: Secondary | ICD-10-CM | POA: Diagnosis not present

## 2024-01-25 DIAGNOSIS — I129 Hypertensive chronic kidney disease with stage 1 through stage 4 chronic kidney disease, or unspecified chronic kidney disease: Secondary | ICD-10-CM | POA: Diagnosis not present

## 2024-01-25 DIAGNOSIS — N2581 Secondary hyperparathyroidism of renal origin: Secondary | ICD-10-CM | POA: Diagnosis not present

## 2024-01-25 DIAGNOSIS — D509 Iron deficiency anemia, unspecified: Secondary | ICD-10-CM | POA: Diagnosis not present

## 2024-01-25 DIAGNOSIS — D631 Anemia in chronic kidney disease: Secondary | ICD-10-CM | POA: Diagnosis not present

## 2024-01-25 DIAGNOSIS — Z992 Dependence on renal dialysis: Secondary | ICD-10-CM | POA: Diagnosis not present

## 2024-01-25 DIAGNOSIS — N186 End stage renal disease: Secondary | ICD-10-CM | POA: Diagnosis not present

## 2024-01-28 DIAGNOSIS — E1122 Type 2 diabetes mellitus with diabetic chronic kidney disease: Secondary | ICD-10-CM | POA: Diagnosis not present

## 2024-01-28 DIAGNOSIS — D509 Iron deficiency anemia, unspecified: Secondary | ICD-10-CM | POA: Diagnosis not present

## 2024-01-28 DIAGNOSIS — N2581 Secondary hyperparathyroidism of renal origin: Secondary | ICD-10-CM | POA: Diagnosis not present

## 2024-01-28 DIAGNOSIS — Z992 Dependence on renal dialysis: Secondary | ICD-10-CM | POA: Diagnosis not present

## 2024-01-28 DIAGNOSIS — N186 End stage renal disease: Secondary | ICD-10-CM | POA: Diagnosis not present

## 2024-01-28 DIAGNOSIS — D631 Anemia in chronic kidney disease: Secondary | ICD-10-CM | POA: Diagnosis not present

## 2024-01-30 DIAGNOSIS — N186 End stage renal disease: Secondary | ICD-10-CM | POA: Diagnosis not present

## 2024-01-30 DIAGNOSIS — D631 Anemia in chronic kidney disease: Secondary | ICD-10-CM | POA: Diagnosis not present

## 2024-01-30 DIAGNOSIS — D509 Iron deficiency anemia, unspecified: Secondary | ICD-10-CM | POA: Diagnosis not present

## 2024-01-30 DIAGNOSIS — N2581 Secondary hyperparathyroidism of renal origin: Secondary | ICD-10-CM | POA: Diagnosis not present

## 2024-01-30 DIAGNOSIS — E1122 Type 2 diabetes mellitus with diabetic chronic kidney disease: Secondary | ICD-10-CM | POA: Diagnosis not present

## 2024-01-30 DIAGNOSIS — Z992 Dependence on renal dialysis: Secondary | ICD-10-CM | POA: Diagnosis not present

## 2024-02-01 DIAGNOSIS — Z992 Dependence on renal dialysis: Secondary | ICD-10-CM | POA: Diagnosis not present

## 2024-02-01 DIAGNOSIS — D509 Iron deficiency anemia, unspecified: Secondary | ICD-10-CM | POA: Diagnosis not present

## 2024-02-01 DIAGNOSIS — D631 Anemia in chronic kidney disease: Secondary | ICD-10-CM | POA: Diagnosis not present

## 2024-02-01 DIAGNOSIS — N186 End stage renal disease: Secondary | ICD-10-CM | POA: Diagnosis not present

## 2024-02-01 DIAGNOSIS — N2581 Secondary hyperparathyroidism of renal origin: Secondary | ICD-10-CM | POA: Diagnosis not present

## 2024-02-01 DIAGNOSIS — E1122 Type 2 diabetes mellitus with diabetic chronic kidney disease: Secondary | ICD-10-CM | POA: Diagnosis not present

## 2024-02-04 DIAGNOSIS — D631 Anemia in chronic kidney disease: Secondary | ICD-10-CM | POA: Diagnosis not present

## 2024-02-04 DIAGNOSIS — E1122 Type 2 diabetes mellitus with diabetic chronic kidney disease: Secondary | ICD-10-CM | POA: Diagnosis not present

## 2024-02-04 DIAGNOSIS — N2581 Secondary hyperparathyroidism of renal origin: Secondary | ICD-10-CM | POA: Diagnosis not present

## 2024-02-04 DIAGNOSIS — Z992 Dependence on renal dialysis: Secondary | ICD-10-CM | POA: Diagnosis not present

## 2024-02-04 DIAGNOSIS — D509 Iron deficiency anemia, unspecified: Secondary | ICD-10-CM | POA: Diagnosis not present

## 2024-02-04 DIAGNOSIS — N186 End stage renal disease: Secondary | ICD-10-CM | POA: Diagnosis not present

## 2024-02-06 DIAGNOSIS — D631 Anemia in chronic kidney disease: Secondary | ICD-10-CM | POA: Diagnosis not present

## 2024-02-06 DIAGNOSIS — Z992 Dependence on renal dialysis: Secondary | ICD-10-CM | POA: Diagnosis not present

## 2024-02-06 DIAGNOSIS — E1122 Type 2 diabetes mellitus with diabetic chronic kidney disease: Secondary | ICD-10-CM | POA: Diagnosis not present

## 2024-02-06 DIAGNOSIS — D509 Iron deficiency anemia, unspecified: Secondary | ICD-10-CM | POA: Diagnosis not present

## 2024-02-06 DIAGNOSIS — N186 End stage renal disease: Secondary | ICD-10-CM | POA: Diagnosis not present

## 2024-02-06 DIAGNOSIS — N2581 Secondary hyperparathyroidism of renal origin: Secondary | ICD-10-CM | POA: Diagnosis not present

## 2024-02-08 DIAGNOSIS — D631 Anemia in chronic kidney disease: Secondary | ICD-10-CM | POA: Diagnosis not present

## 2024-02-08 DIAGNOSIS — Z992 Dependence on renal dialysis: Secondary | ICD-10-CM | POA: Diagnosis not present

## 2024-02-08 DIAGNOSIS — D509 Iron deficiency anemia, unspecified: Secondary | ICD-10-CM | POA: Diagnosis not present

## 2024-02-08 DIAGNOSIS — E1122 Type 2 diabetes mellitus with diabetic chronic kidney disease: Secondary | ICD-10-CM | POA: Diagnosis not present

## 2024-02-08 DIAGNOSIS — N186 End stage renal disease: Secondary | ICD-10-CM | POA: Diagnosis not present

## 2024-02-08 DIAGNOSIS — N2581 Secondary hyperparathyroidism of renal origin: Secondary | ICD-10-CM | POA: Diagnosis not present

## 2024-02-11 ENCOUNTER — Ambulatory Visit (INDEPENDENT_AMBULATORY_CARE_PROVIDER_SITE_OTHER): Payer: Medicare Other

## 2024-02-11 DIAGNOSIS — E1122 Type 2 diabetes mellitus with diabetic chronic kidney disease: Secondary | ICD-10-CM | POA: Diagnosis not present

## 2024-02-11 DIAGNOSIS — Z992 Dependence on renal dialysis: Secondary | ICD-10-CM | POA: Diagnosis not present

## 2024-02-11 DIAGNOSIS — N186 End stage renal disease: Secondary | ICD-10-CM | POA: Diagnosis not present

## 2024-02-11 DIAGNOSIS — I639 Cerebral infarction, unspecified: Secondary | ICD-10-CM

## 2024-02-11 DIAGNOSIS — N2581 Secondary hyperparathyroidism of renal origin: Secondary | ICD-10-CM | POA: Diagnosis not present

## 2024-02-11 DIAGNOSIS — D509 Iron deficiency anemia, unspecified: Secondary | ICD-10-CM | POA: Diagnosis not present

## 2024-02-11 DIAGNOSIS — D631 Anemia in chronic kidney disease: Secondary | ICD-10-CM | POA: Diagnosis not present

## 2024-02-12 DIAGNOSIS — E0590 Thyrotoxicosis, unspecified without thyrotoxic crisis or storm: Secondary | ICD-10-CM | POA: Diagnosis not present

## 2024-02-12 LAB — CUP PACEART REMOTE DEVICE CHECK
Date Time Interrogation Session: 20250217000652
Implantable Pulse Generator Implant Date: 20230201

## 2024-02-13 DIAGNOSIS — N2581 Secondary hyperparathyroidism of renal origin: Secondary | ICD-10-CM | POA: Diagnosis not present

## 2024-02-13 DIAGNOSIS — D509 Iron deficiency anemia, unspecified: Secondary | ICD-10-CM | POA: Diagnosis not present

## 2024-02-13 DIAGNOSIS — D631 Anemia in chronic kidney disease: Secondary | ICD-10-CM | POA: Diagnosis not present

## 2024-02-13 DIAGNOSIS — E1122 Type 2 diabetes mellitus with diabetic chronic kidney disease: Secondary | ICD-10-CM | POA: Diagnosis not present

## 2024-02-13 DIAGNOSIS — Z992 Dependence on renal dialysis: Secondary | ICD-10-CM | POA: Diagnosis not present

## 2024-02-13 DIAGNOSIS — N186 End stage renal disease: Secondary | ICD-10-CM | POA: Diagnosis not present

## 2024-02-15 DIAGNOSIS — N2581 Secondary hyperparathyroidism of renal origin: Secondary | ICD-10-CM | POA: Diagnosis not present

## 2024-02-15 DIAGNOSIS — D631 Anemia in chronic kidney disease: Secondary | ICD-10-CM | POA: Diagnosis not present

## 2024-02-15 DIAGNOSIS — D509 Iron deficiency anemia, unspecified: Secondary | ICD-10-CM | POA: Diagnosis not present

## 2024-02-15 DIAGNOSIS — N186 End stage renal disease: Secondary | ICD-10-CM | POA: Diagnosis not present

## 2024-02-15 DIAGNOSIS — Z992 Dependence on renal dialysis: Secondary | ICD-10-CM | POA: Diagnosis not present

## 2024-02-15 DIAGNOSIS — E1122 Type 2 diabetes mellitus with diabetic chronic kidney disease: Secondary | ICD-10-CM | POA: Diagnosis not present

## 2024-02-18 DIAGNOSIS — D509 Iron deficiency anemia, unspecified: Secondary | ICD-10-CM | POA: Diagnosis not present

## 2024-02-18 DIAGNOSIS — N186 End stage renal disease: Secondary | ICD-10-CM | POA: Diagnosis not present

## 2024-02-18 DIAGNOSIS — E1122 Type 2 diabetes mellitus with diabetic chronic kidney disease: Secondary | ICD-10-CM | POA: Diagnosis not present

## 2024-02-18 DIAGNOSIS — N2581 Secondary hyperparathyroidism of renal origin: Secondary | ICD-10-CM | POA: Diagnosis not present

## 2024-02-18 DIAGNOSIS — D631 Anemia in chronic kidney disease: Secondary | ICD-10-CM | POA: Diagnosis not present

## 2024-02-18 DIAGNOSIS — Z992 Dependence on renal dialysis: Secondary | ICD-10-CM | POA: Diagnosis not present

## 2024-02-19 NOTE — Progress Notes (Signed)
 Carelink Summary Report / Loop Recorder

## 2024-02-20 DIAGNOSIS — E1122 Type 2 diabetes mellitus with diabetic chronic kidney disease: Secondary | ICD-10-CM | POA: Diagnosis not present

## 2024-02-20 DIAGNOSIS — N2581 Secondary hyperparathyroidism of renal origin: Secondary | ICD-10-CM | POA: Diagnosis not present

## 2024-02-20 DIAGNOSIS — Z992 Dependence on renal dialysis: Secondary | ICD-10-CM | POA: Diagnosis not present

## 2024-02-20 DIAGNOSIS — N186 End stage renal disease: Secondary | ICD-10-CM | POA: Diagnosis not present

## 2024-02-20 DIAGNOSIS — D631 Anemia in chronic kidney disease: Secondary | ICD-10-CM | POA: Diagnosis not present

## 2024-02-20 DIAGNOSIS — D509 Iron deficiency anemia, unspecified: Secondary | ICD-10-CM | POA: Diagnosis not present

## 2024-02-22 DIAGNOSIS — N186 End stage renal disease: Secondary | ICD-10-CM | POA: Diagnosis not present

## 2024-02-22 DIAGNOSIS — N2581 Secondary hyperparathyroidism of renal origin: Secondary | ICD-10-CM | POA: Diagnosis not present

## 2024-02-22 DIAGNOSIS — D631 Anemia in chronic kidney disease: Secondary | ICD-10-CM | POA: Diagnosis not present

## 2024-02-22 DIAGNOSIS — E1122 Type 2 diabetes mellitus with diabetic chronic kidney disease: Secondary | ICD-10-CM | POA: Diagnosis not present

## 2024-02-22 DIAGNOSIS — I129 Hypertensive chronic kidney disease with stage 1 through stage 4 chronic kidney disease, or unspecified chronic kidney disease: Secondary | ICD-10-CM | POA: Diagnosis not present

## 2024-02-22 DIAGNOSIS — Z992 Dependence on renal dialysis: Secondary | ICD-10-CM | POA: Diagnosis not present

## 2024-02-22 DIAGNOSIS — D509 Iron deficiency anemia, unspecified: Secondary | ICD-10-CM | POA: Diagnosis not present

## 2024-02-25 DIAGNOSIS — D631 Anemia in chronic kidney disease: Secondary | ICD-10-CM | POA: Diagnosis not present

## 2024-02-25 DIAGNOSIS — N186 End stage renal disease: Secondary | ICD-10-CM | POA: Diagnosis not present

## 2024-02-25 DIAGNOSIS — Z992 Dependence on renal dialysis: Secondary | ICD-10-CM | POA: Diagnosis not present

## 2024-02-25 DIAGNOSIS — N2581 Secondary hyperparathyroidism of renal origin: Secondary | ICD-10-CM | POA: Diagnosis not present

## 2024-02-25 DIAGNOSIS — D509 Iron deficiency anemia, unspecified: Secondary | ICD-10-CM | POA: Diagnosis not present

## 2024-02-27 DIAGNOSIS — D509 Iron deficiency anemia, unspecified: Secondary | ICD-10-CM | POA: Diagnosis not present

## 2024-02-27 DIAGNOSIS — D631 Anemia in chronic kidney disease: Secondary | ICD-10-CM | POA: Diagnosis not present

## 2024-02-27 DIAGNOSIS — N186 End stage renal disease: Secondary | ICD-10-CM | POA: Diagnosis not present

## 2024-02-27 DIAGNOSIS — N2581 Secondary hyperparathyroidism of renal origin: Secondary | ICD-10-CM | POA: Diagnosis not present

## 2024-02-27 DIAGNOSIS — Z992 Dependence on renal dialysis: Secondary | ICD-10-CM | POA: Diagnosis not present

## 2024-02-29 DIAGNOSIS — N186 End stage renal disease: Secondary | ICD-10-CM | POA: Diagnosis not present

## 2024-02-29 DIAGNOSIS — D509 Iron deficiency anemia, unspecified: Secondary | ICD-10-CM | POA: Diagnosis not present

## 2024-02-29 DIAGNOSIS — Z992 Dependence on renal dialysis: Secondary | ICD-10-CM | POA: Diagnosis not present

## 2024-02-29 DIAGNOSIS — D631 Anemia in chronic kidney disease: Secondary | ICD-10-CM | POA: Diagnosis not present

## 2024-02-29 DIAGNOSIS — N2581 Secondary hyperparathyroidism of renal origin: Secondary | ICD-10-CM | POA: Diagnosis not present

## 2024-03-03 DIAGNOSIS — D631 Anemia in chronic kidney disease: Secondary | ICD-10-CM | POA: Diagnosis not present

## 2024-03-03 DIAGNOSIS — D509 Iron deficiency anemia, unspecified: Secondary | ICD-10-CM | POA: Diagnosis not present

## 2024-03-03 DIAGNOSIS — N2581 Secondary hyperparathyroidism of renal origin: Secondary | ICD-10-CM | POA: Diagnosis not present

## 2024-03-03 DIAGNOSIS — N186 End stage renal disease: Secondary | ICD-10-CM | POA: Diagnosis not present

## 2024-03-03 DIAGNOSIS — Z992 Dependence on renal dialysis: Secondary | ICD-10-CM | POA: Diagnosis not present

## 2024-03-05 DIAGNOSIS — D631 Anemia in chronic kidney disease: Secondary | ICD-10-CM | POA: Diagnosis not present

## 2024-03-05 DIAGNOSIS — D509 Iron deficiency anemia, unspecified: Secondary | ICD-10-CM | POA: Diagnosis not present

## 2024-03-05 DIAGNOSIS — N2581 Secondary hyperparathyroidism of renal origin: Secondary | ICD-10-CM | POA: Diagnosis not present

## 2024-03-05 DIAGNOSIS — Z992 Dependence on renal dialysis: Secondary | ICD-10-CM | POA: Diagnosis not present

## 2024-03-05 DIAGNOSIS — N186 End stage renal disease: Secondary | ICD-10-CM | POA: Diagnosis not present

## 2024-03-07 DIAGNOSIS — D509 Iron deficiency anemia, unspecified: Secondary | ICD-10-CM | POA: Diagnosis not present

## 2024-03-07 DIAGNOSIS — N186 End stage renal disease: Secondary | ICD-10-CM | POA: Diagnosis not present

## 2024-03-07 DIAGNOSIS — Z992 Dependence on renal dialysis: Secondary | ICD-10-CM | POA: Diagnosis not present

## 2024-03-07 DIAGNOSIS — N2581 Secondary hyperparathyroidism of renal origin: Secondary | ICD-10-CM | POA: Diagnosis not present

## 2024-03-07 DIAGNOSIS — D631 Anemia in chronic kidney disease: Secondary | ICD-10-CM | POA: Diagnosis not present

## 2024-03-10 DIAGNOSIS — D631 Anemia in chronic kidney disease: Secondary | ICD-10-CM | POA: Diagnosis not present

## 2024-03-10 DIAGNOSIS — D509 Iron deficiency anemia, unspecified: Secondary | ICD-10-CM | POA: Diagnosis not present

## 2024-03-10 DIAGNOSIS — N186 End stage renal disease: Secondary | ICD-10-CM | POA: Diagnosis not present

## 2024-03-10 DIAGNOSIS — Z992 Dependence on renal dialysis: Secondary | ICD-10-CM | POA: Diagnosis not present

## 2024-03-10 DIAGNOSIS — N2581 Secondary hyperparathyroidism of renal origin: Secondary | ICD-10-CM | POA: Diagnosis not present

## 2024-03-11 DIAGNOSIS — E0590 Thyrotoxicosis, unspecified without thyrotoxic crisis or storm: Secondary | ICD-10-CM | POA: Diagnosis not present

## 2024-03-12 DIAGNOSIS — Z992 Dependence on renal dialysis: Secondary | ICD-10-CM | POA: Diagnosis not present

## 2024-03-12 DIAGNOSIS — N2581 Secondary hyperparathyroidism of renal origin: Secondary | ICD-10-CM | POA: Diagnosis not present

## 2024-03-12 DIAGNOSIS — D631 Anemia in chronic kidney disease: Secondary | ICD-10-CM | POA: Diagnosis not present

## 2024-03-12 DIAGNOSIS — N186 End stage renal disease: Secondary | ICD-10-CM | POA: Diagnosis not present

## 2024-03-12 DIAGNOSIS — D509 Iron deficiency anemia, unspecified: Secondary | ICD-10-CM | POA: Diagnosis not present

## 2024-03-14 DIAGNOSIS — D509 Iron deficiency anemia, unspecified: Secondary | ICD-10-CM | POA: Diagnosis not present

## 2024-03-14 DIAGNOSIS — N2581 Secondary hyperparathyroidism of renal origin: Secondary | ICD-10-CM | POA: Diagnosis not present

## 2024-03-14 DIAGNOSIS — Z992 Dependence on renal dialysis: Secondary | ICD-10-CM | POA: Diagnosis not present

## 2024-03-14 DIAGNOSIS — D631 Anemia in chronic kidney disease: Secondary | ICD-10-CM | POA: Diagnosis not present

## 2024-03-14 DIAGNOSIS — N186 End stage renal disease: Secondary | ICD-10-CM | POA: Diagnosis not present

## 2024-03-17 ENCOUNTER — Ambulatory Visit (INDEPENDENT_AMBULATORY_CARE_PROVIDER_SITE_OTHER): Payer: Medicare Other

## 2024-03-17 DIAGNOSIS — N2581 Secondary hyperparathyroidism of renal origin: Secondary | ICD-10-CM | POA: Diagnosis not present

## 2024-03-17 DIAGNOSIS — N186 End stage renal disease: Secondary | ICD-10-CM | POA: Diagnosis not present

## 2024-03-17 DIAGNOSIS — I639 Cerebral infarction, unspecified: Secondary | ICD-10-CM | POA: Diagnosis not present

## 2024-03-17 DIAGNOSIS — D509 Iron deficiency anemia, unspecified: Secondary | ICD-10-CM | POA: Diagnosis not present

## 2024-03-17 DIAGNOSIS — Z992 Dependence on renal dialysis: Secondary | ICD-10-CM | POA: Diagnosis not present

## 2024-03-17 DIAGNOSIS — D631 Anemia in chronic kidney disease: Secondary | ICD-10-CM | POA: Diagnosis not present

## 2024-03-17 LAB — CUP PACEART REMOTE DEVICE CHECK
Date Time Interrogation Session: 20250324001000
Implantable Pulse Generator Implant Date: 20230201

## 2024-03-19 DIAGNOSIS — Z992 Dependence on renal dialysis: Secondary | ICD-10-CM | POA: Diagnosis not present

## 2024-03-19 DIAGNOSIS — N186 End stage renal disease: Secondary | ICD-10-CM | POA: Diagnosis not present

## 2024-03-19 DIAGNOSIS — N2581 Secondary hyperparathyroidism of renal origin: Secondary | ICD-10-CM | POA: Diagnosis not present

## 2024-03-19 DIAGNOSIS — D509 Iron deficiency anemia, unspecified: Secondary | ICD-10-CM | POA: Diagnosis not present

## 2024-03-19 DIAGNOSIS — D631 Anemia in chronic kidney disease: Secondary | ICD-10-CM | POA: Diagnosis not present

## 2024-03-20 DIAGNOSIS — E0590 Thyrotoxicosis, unspecified without thyrotoxic crisis or storm: Secondary | ICD-10-CM | POA: Diagnosis not present

## 2024-03-20 NOTE — Progress Notes (Signed)
 Carelink Summary Report / Loop Recorder

## 2024-03-20 NOTE — Addendum Note (Signed)
 Addended by: Geralyn Flash D on: 03/20/2024 04:13 PM   Modules accepted: Orders

## 2024-03-21 ENCOUNTER — Other Ambulatory Visit: Payer: Self-pay | Admitting: Nurse Practitioner

## 2024-03-21 DIAGNOSIS — N186 End stage renal disease: Secondary | ICD-10-CM | POA: Diagnosis not present

## 2024-03-21 DIAGNOSIS — Z992 Dependence on renal dialysis: Secondary | ICD-10-CM | POA: Diagnosis not present

## 2024-03-21 DIAGNOSIS — D509 Iron deficiency anemia, unspecified: Secondary | ICD-10-CM | POA: Diagnosis not present

## 2024-03-21 DIAGNOSIS — N2581 Secondary hyperparathyroidism of renal origin: Secondary | ICD-10-CM | POA: Diagnosis not present

## 2024-03-21 DIAGNOSIS — E0590 Thyrotoxicosis, unspecified without thyrotoxic crisis or storm: Secondary | ICD-10-CM

## 2024-03-21 DIAGNOSIS — D631 Anemia in chronic kidney disease: Secondary | ICD-10-CM | POA: Diagnosis not present

## 2024-03-24 DIAGNOSIS — D509 Iron deficiency anemia, unspecified: Secondary | ICD-10-CM | POA: Diagnosis not present

## 2024-03-24 DIAGNOSIS — N2581 Secondary hyperparathyroidism of renal origin: Secondary | ICD-10-CM | POA: Diagnosis not present

## 2024-03-24 DIAGNOSIS — Z992 Dependence on renal dialysis: Secondary | ICD-10-CM | POA: Diagnosis not present

## 2024-03-24 DIAGNOSIS — N186 End stage renal disease: Secondary | ICD-10-CM | POA: Diagnosis not present

## 2024-03-24 DIAGNOSIS — I129 Hypertensive chronic kidney disease with stage 1 through stage 4 chronic kidney disease, or unspecified chronic kidney disease: Secondary | ICD-10-CM | POA: Diagnosis not present

## 2024-03-24 DIAGNOSIS — D631 Anemia in chronic kidney disease: Secondary | ICD-10-CM | POA: Diagnosis not present

## 2024-03-26 DIAGNOSIS — Z992 Dependence on renal dialysis: Secondary | ICD-10-CM | POA: Diagnosis not present

## 2024-03-26 DIAGNOSIS — D631 Anemia in chronic kidney disease: Secondary | ICD-10-CM | POA: Diagnosis not present

## 2024-03-26 DIAGNOSIS — N186 End stage renal disease: Secondary | ICD-10-CM | POA: Diagnosis not present

## 2024-03-26 DIAGNOSIS — N2581 Secondary hyperparathyroidism of renal origin: Secondary | ICD-10-CM | POA: Diagnosis not present

## 2024-03-26 DIAGNOSIS — D509 Iron deficiency anemia, unspecified: Secondary | ICD-10-CM | POA: Diagnosis not present

## 2024-03-31 DIAGNOSIS — D509 Iron deficiency anemia, unspecified: Secondary | ICD-10-CM | POA: Diagnosis not present

## 2024-03-31 DIAGNOSIS — N2581 Secondary hyperparathyroidism of renal origin: Secondary | ICD-10-CM | POA: Diagnosis not present

## 2024-03-31 DIAGNOSIS — D631 Anemia in chronic kidney disease: Secondary | ICD-10-CM | POA: Diagnosis not present

## 2024-03-31 DIAGNOSIS — N186 End stage renal disease: Secondary | ICD-10-CM | POA: Diagnosis not present

## 2024-03-31 DIAGNOSIS — Z992 Dependence on renal dialysis: Secondary | ICD-10-CM | POA: Diagnosis not present

## 2024-04-02 DIAGNOSIS — Z992 Dependence on renal dialysis: Secondary | ICD-10-CM | POA: Diagnosis not present

## 2024-04-02 DIAGNOSIS — D631 Anemia in chronic kidney disease: Secondary | ICD-10-CM | POA: Diagnosis not present

## 2024-04-02 DIAGNOSIS — N186 End stage renal disease: Secondary | ICD-10-CM | POA: Diagnosis not present

## 2024-04-02 DIAGNOSIS — N2581 Secondary hyperparathyroidism of renal origin: Secondary | ICD-10-CM | POA: Diagnosis not present

## 2024-04-02 DIAGNOSIS — D509 Iron deficiency anemia, unspecified: Secondary | ICD-10-CM | POA: Diagnosis not present

## 2024-04-04 DIAGNOSIS — D509 Iron deficiency anemia, unspecified: Secondary | ICD-10-CM | POA: Diagnosis not present

## 2024-04-04 DIAGNOSIS — Z992 Dependence on renal dialysis: Secondary | ICD-10-CM | POA: Diagnosis not present

## 2024-04-04 DIAGNOSIS — N2581 Secondary hyperparathyroidism of renal origin: Secondary | ICD-10-CM | POA: Diagnosis not present

## 2024-04-04 DIAGNOSIS — D631 Anemia in chronic kidney disease: Secondary | ICD-10-CM | POA: Diagnosis not present

## 2024-04-04 DIAGNOSIS — N186 End stage renal disease: Secondary | ICD-10-CM | POA: Diagnosis not present

## 2024-04-07 DIAGNOSIS — N2581 Secondary hyperparathyroidism of renal origin: Secondary | ICD-10-CM | POA: Diagnosis not present

## 2024-04-07 DIAGNOSIS — D509 Iron deficiency anemia, unspecified: Secondary | ICD-10-CM | POA: Diagnosis not present

## 2024-04-07 DIAGNOSIS — N186 End stage renal disease: Secondary | ICD-10-CM | POA: Diagnosis not present

## 2024-04-07 DIAGNOSIS — Z992 Dependence on renal dialysis: Secondary | ICD-10-CM | POA: Diagnosis not present

## 2024-04-07 DIAGNOSIS — D631 Anemia in chronic kidney disease: Secondary | ICD-10-CM | POA: Diagnosis not present

## 2024-04-09 DIAGNOSIS — D631 Anemia in chronic kidney disease: Secondary | ICD-10-CM | POA: Diagnosis not present

## 2024-04-09 DIAGNOSIS — N186 End stage renal disease: Secondary | ICD-10-CM | POA: Diagnosis not present

## 2024-04-09 DIAGNOSIS — N2581 Secondary hyperparathyroidism of renal origin: Secondary | ICD-10-CM | POA: Diagnosis not present

## 2024-04-09 DIAGNOSIS — D509 Iron deficiency anemia, unspecified: Secondary | ICD-10-CM | POA: Diagnosis not present

## 2024-04-09 DIAGNOSIS — Z992 Dependence on renal dialysis: Secondary | ICD-10-CM | POA: Diagnosis not present

## 2024-04-11 DIAGNOSIS — D631 Anemia in chronic kidney disease: Secondary | ICD-10-CM | POA: Diagnosis not present

## 2024-04-11 DIAGNOSIS — Z992 Dependence on renal dialysis: Secondary | ICD-10-CM | POA: Diagnosis not present

## 2024-04-11 DIAGNOSIS — D509 Iron deficiency anemia, unspecified: Secondary | ICD-10-CM | POA: Diagnosis not present

## 2024-04-11 DIAGNOSIS — N186 End stage renal disease: Secondary | ICD-10-CM | POA: Diagnosis not present

## 2024-04-11 DIAGNOSIS — N2581 Secondary hyperparathyroidism of renal origin: Secondary | ICD-10-CM | POA: Diagnosis not present

## 2024-04-14 DIAGNOSIS — N186 End stage renal disease: Secondary | ICD-10-CM | POA: Diagnosis not present

## 2024-04-14 DIAGNOSIS — D631 Anemia in chronic kidney disease: Secondary | ICD-10-CM | POA: Diagnosis not present

## 2024-04-14 DIAGNOSIS — D509 Iron deficiency anemia, unspecified: Secondary | ICD-10-CM | POA: Diagnosis not present

## 2024-04-14 DIAGNOSIS — N2581 Secondary hyperparathyroidism of renal origin: Secondary | ICD-10-CM | POA: Diagnosis not present

## 2024-04-14 DIAGNOSIS — Z992 Dependence on renal dialysis: Secondary | ICD-10-CM | POA: Diagnosis not present

## 2024-04-16 DIAGNOSIS — N2581 Secondary hyperparathyroidism of renal origin: Secondary | ICD-10-CM | POA: Diagnosis not present

## 2024-04-16 DIAGNOSIS — Z992 Dependence on renal dialysis: Secondary | ICD-10-CM | POA: Diagnosis not present

## 2024-04-16 DIAGNOSIS — N186 End stage renal disease: Secondary | ICD-10-CM | POA: Diagnosis not present

## 2024-04-16 DIAGNOSIS — D509 Iron deficiency anemia, unspecified: Secondary | ICD-10-CM | POA: Diagnosis not present

## 2024-04-16 DIAGNOSIS — D631 Anemia in chronic kidney disease: Secondary | ICD-10-CM | POA: Diagnosis not present

## 2024-04-18 DIAGNOSIS — N2581 Secondary hyperparathyroidism of renal origin: Secondary | ICD-10-CM | POA: Diagnosis not present

## 2024-04-18 DIAGNOSIS — D509 Iron deficiency anemia, unspecified: Secondary | ICD-10-CM | POA: Diagnosis not present

## 2024-04-18 DIAGNOSIS — D631 Anemia in chronic kidney disease: Secondary | ICD-10-CM | POA: Diagnosis not present

## 2024-04-18 DIAGNOSIS — N186 End stage renal disease: Secondary | ICD-10-CM | POA: Diagnosis not present

## 2024-04-18 DIAGNOSIS — Z992 Dependence on renal dialysis: Secondary | ICD-10-CM | POA: Diagnosis not present

## 2024-04-21 ENCOUNTER — Ambulatory Visit (INDEPENDENT_AMBULATORY_CARE_PROVIDER_SITE_OTHER): Payer: Medicare Other

## 2024-04-21 DIAGNOSIS — D509 Iron deficiency anemia, unspecified: Secondary | ICD-10-CM | POA: Diagnosis not present

## 2024-04-21 DIAGNOSIS — N2581 Secondary hyperparathyroidism of renal origin: Secondary | ICD-10-CM | POA: Diagnosis not present

## 2024-04-21 DIAGNOSIS — E0590 Thyrotoxicosis, unspecified without thyrotoxic crisis or storm: Secondary | ICD-10-CM | POA: Diagnosis not present

## 2024-04-21 DIAGNOSIS — D631 Anemia in chronic kidney disease: Secondary | ICD-10-CM | POA: Diagnosis not present

## 2024-04-21 DIAGNOSIS — I639 Cerebral infarction, unspecified: Secondary | ICD-10-CM | POA: Diagnosis not present

## 2024-04-21 DIAGNOSIS — N186 End stage renal disease: Secondary | ICD-10-CM | POA: Diagnosis not present

## 2024-04-21 DIAGNOSIS — Z992 Dependence on renal dialysis: Secondary | ICD-10-CM | POA: Diagnosis not present

## 2024-04-21 LAB — CUP PACEART REMOTE DEVICE CHECK
Date Time Interrogation Session: 20250428000429
Implantable Pulse Generator Implant Date: 20230201

## 2024-04-22 DIAGNOSIS — M810 Age-related osteoporosis without current pathological fracture: Secondary | ICD-10-CM | POA: Diagnosis not present

## 2024-04-23 DIAGNOSIS — D509 Iron deficiency anemia, unspecified: Secondary | ICD-10-CM | POA: Diagnosis not present

## 2024-04-23 DIAGNOSIS — D631 Anemia in chronic kidney disease: Secondary | ICD-10-CM | POA: Diagnosis not present

## 2024-04-23 DIAGNOSIS — Z992 Dependence on renal dialysis: Secondary | ICD-10-CM | POA: Diagnosis not present

## 2024-04-23 DIAGNOSIS — N186 End stage renal disease: Secondary | ICD-10-CM | POA: Diagnosis not present

## 2024-04-23 DIAGNOSIS — N2581 Secondary hyperparathyroidism of renal origin: Secondary | ICD-10-CM | POA: Diagnosis not present

## 2024-04-23 DIAGNOSIS — I129 Hypertensive chronic kidney disease with stage 1 through stage 4 chronic kidney disease, or unspecified chronic kidney disease: Secondary | ICD-10-CM | POA: Diagnosis not present

## 2024-04-25 DIAGNOSIS — Z992 Dependence on renal dialysis: Secondary | ICD-10-CM | POA: Diagnosis not present

## 2024-04-25 DIAGNOSIS — N186 End stage renal disease: Secondary | ICD-10-CM | POA: Diagnosis not present

## 2024-04-25 DIAGNOSIS — E1122 Type 2 diabetes mellitus with diabetic chronic kidney disease: Secondary | ICD-10-CM | POA: Diagnosis not present

## 2024-04-25 DIAGNOSIS — D631 Anemia in chronic kidney disease: Secondary | ICD-10-CM | POA: Diagnosis not present

## 2024-04-25 DIAGNOSIS — N2581 Secondary hyperparathyroidism of renal origin: Secondary | ICD-10-CM | POA: Diagnosis not present

## 2024-04-25 DIAGNOSIS — D509 Iron deficiency anemia, unspecified: Secondary | ICD-10-CM | POA: Diagnosis not present

## 2024-04-28 DIAGNOSIS — E1122 Type 2 diabetes mellitus with diabetic chronic kidney disease: Secondary | ICD-10-CM | POA: Diagnosis not present

## 2024-04-28 DIAGNOSIS — Z992 Dependence on renal dialysis: Secondary | ICD-10-CM | POA: Diagnosis not present

## 2024-04-28 DIAGNOSIS — N2581 Secondary hyperparathyroidism of renal origin: Secondary | ICD-10-CM | POA: Diagnosis not present

## 2024-04-28 DIAGNOSIS — N186 End stage renal disease: Secondary | ICD-10-CM | POA: Diagnosis not present

## 2024-04-28 DIAGNOSIS — D509 Iron deficiency anemia, unspecified: Secondary | ICD-10-CM | POA: Diagnosis not present

## 2024-04-28 DIAGNOSIS — D631 Anemia in chronic kidney disease: Secondary | ICD-10-CM | POA: Diagnosis not present

## 2024-04-30 DIAGNOSIS — D631 Anemia in chronic kidney disease: Secondary | ICD-10-CM | POA: Diagnosis not present

## 2024-04-30 DIAGNOSIS — Z992 Dependence on renal dialysis: Secondary | ICD-10-CM | POA: Diagnosis not present

## 2024-04-30 DIAGNOSIS — D509 Iron deficiency anemia, unspecified: Secondary | ICD-10-CM | POA: Diagnosis not present

## 2024-04-30 DIAGNOSIS — N2581 Secondary hyperparathyroidism of renal origin: Secondary | ICD-10-CM | POA: Diagnosis not present

## 2024-04-30 DIAGNOSIS — E1122 Type 2 diabetes mellitus with diabetic chronic kidney disease: Secondary | ICD-10-CM | POA: Diagnosis not present

## 2024-04-30 DIAGNOSIS — N186 End stage renal disease: Secondary | ICD-10-CM | POA: Diagnosis not present

## 2024-05-02 DIAGNOSIS — D509 Iron deficiency anemia, unspecified: Secondary | ICD-10-CM | POA: Diagnosis not present

## 2024-05-02 DIAGNOSIS — E1122 Type 2 diabetes mellitus with diabetic chronic kidney disease: Secondary | ICD-10-CM | POA: Diagnosis not present

## 2024-05-02 DIAGNOSIS — D631 Anemia in chronic kidney disease: Secondary | ICD-10-CM | POA: Diagnosis not present

## 2024-05-02 DIAGNOSIS — N186 End stage renal disease: Secondary | ICD-10-CM | POA: Diagnosis not present

## 2024-05-02 DIAGNOSIS — N2581 Secondary hyperparathyroidism of renal origin: Secondary | ICD-10-CM | POA: Diagnosis not present

## 2024-05-02 DIAGNOSIS — Z992 Dependence on renal dialysis: Secondary | ICD-10-CM | POA: Diagnosis not present

## 2024-05-05 DIAGNOSIS — N186 End stage renal disease: Secondary | ICD-10-CM | POA: Diagnosis not present

## 2024-05-05 DIAGNOSIS — E1122 Type 2 diabetes mellitus with diabetic chronic kidney disease: Secondary | ICD-10-CM | POA: Diagnosis not present

## 2024-05-05 DIAGNOSIS — D509 Iron deficiency anemia, unspecified: Secondary | ICD-10-CM | POA: Diagnosis not present

## 2024-05-05 DIAGNOSIS — N2581 Secondary hyperparathyroidism of renal origin: Secondary | ICD-10-CM | POA: Diagnosis not present

## 2024-05-05 DIAGNOSIS — M79644 Pain in right finger(s): Secondary | ICD-10-CM | POA: Diagnosis not present

## 2024-05-05 DIAGNOSIS — Z992 Dependence on renal dialysis: Secondary | ICD-10-CM | POA: Diagnosis not present

## 2024-05-05 DIAGNOSIS — S61210A Laceration without foreign body of right index finger without damage to nail, initial encounter: Secondary | ICD-10-CM | POA: Diagnosis not present

## 2024-05-05 DIAGNOSIS — D631 Anemia in chronic kidney disease: Secondary | ICD-10-CM | POA: Diagnosis not present

## 2024-05-05 NOTE — Progress Notes (Signed)
 Carelink Summary Report / Loop Recorder

## 2024-05-07 DIAGNOSIS — D509 Iron deficiency anemia, unspecified: Secondary | ICD-10-CM | POA: Diagnosis not present

## 2024-05-07 DIAGNOSIS — E1122 Type 2 diabetes mellitus with diabetic chronic kidney disease: Secondary | ICD-10-CM | POA: Diagnosis not present

## 2024-05-07 DIAGNOSIS — N2581 Secondary hyperparathyroidism of renal origin: Secondary | ICD-10-CM | POA: Diagnosis not present

## 2024-05-07 DIAGNOSIS — D631 Anemia in chronic kidney disease: Secondary | ICD-10-CM | POA: Diagnosis not present

## 2024-05-07 DIAGNOSIS — Z992 Dependence on renal dialysis: Secondary | ICD-10-CM | POA: Diagnosis not present

## 2024-05-07 DIAGNOSIS — N186 End stage renal disease: Secondary | ICD-10-CM | POA: Diagnosis not present

## 2024-05-09 DIAGNOSIS — D509 Iron deficiency anemia, unspecified: Secondary | ICD-10-CM | POA: Diagnosis not present

## 2024-05-09 DIAGNOSIS — N186 End stage renal disease: Secondary | ICD-10-CM | POA: Diagnosis not present

## 2024-05-09 DIAGNOSIS — D631 Anemia in chronic kidney disease: Secondary | ICD-10-CM | POA: Diagnosis not present

## 2024-05-09 DIAGNOSIS — N2581 Secondary hyperparathyroidism of renal origin: Secondary | ICD-10-CM | POA: Diagnosis not present

## 2024-05-09 DIAGNOSIS — E1122 Type 2 diabetes mellitus with diabetic chronic kidney disease: Secondary | ICD-10-CM | POA: Diagnosis not present

## 2024-05-09 DIAGNOSIS — Z992 Dependence on renal dialysis: Secondary | ICD-10-CM | POA: Diagnosis not present

## 2024-05-12 DIAGNOSIS — E1122 Type 2 diabetes mellitus with diabetic chronic kidney disease: Secondary | ICD-10-CM | POA: Diagnosis not present

## 2024-05-12 DIAGNOSIS — N186 End stage renal disease: Secondary | ICD-10-CM | POA: Diagnosis not present

## 2024-05-12 DIAGNOSIS — N2581 Secondary hyperparathyroidism of renal origin: Secondary | ICD-10-CM | POA: Diagnosis not present

## 2024-05-12 DIAGNOSIS — D631 Anemia in chronic kidney disease: Secondary | ICD-10-CM | POA: Diagnosis not present

## 2024-05-12 DIAGNOSIS — Z992 Dependence on renal dialysis: Secondary | ICD-10-CM | POA: Diagnosis not present

## 2024-05-12 DIAGNOSIS — D509 Iron deficiency anemia, unspecified: Secondary | ICD-10-CM | POA: Diagnosis not present

## 2024-05-13 DIAGNOSIS — N186 End stage renal disease: Secondary | ICD-10-CM | POA: Diagnosis not present

## 2024-05-13 DIAGNOSIS — M810 Age-related osteoporosis without current pathological fracture: Secondary | ICD-10-CM | POA: Diagnosis not present

## 2024-05-14 DIAGNOSIS — N186 End stage renal disease: Secondary | ICD-10-CM | POA: Diagnosis not present

## 2024-05-14 DIAGNOSIS — Z992 Dependence on renal dialysis: Secondary | ICD-10-CM | POA: Diagnosis not present

## 2024-05-14 DIAGNOSIS — D631 Anemia in chronic kidney disease: Secondary | ICD-10-CM | POA: Diagnosis not present

## 2024-05-14 DIAGNOSIS — E1122 Type 2 diabetes mellitus with diabetic chronic kidney disease: Secondary | ICD-10-CM | POA: Diagnosis not present

## 2024-05-14 DIAGNOSIS — D509 Iron deficiency anemia, unspecified: Secondary | ICD-10-CM | POA: Diagnosis not present

## 2024-05-14 DIAGNOSIS — N2581 Secondary hyperparathyroidism of renal origin: Secondary | ICD-10-CM | POA: Diagnosis not present

## 2024-05-16 DIAGNOSIS — D631 Anemia in chronic kidney disease: Secondary | ICD-10-CM | POA: Diagnosis not present

## 2024-05-16 DIAGNOSIS — N2581 Secondary hyperparathyroidism of renal origin: Secondary | ICD-10-CM | POA: Diagnosis not present

## 2024-05-16 DIAGNOSIS — E1122 Type 2 diabetes mellitus with diabetic chronic kidney disease: Secondary | ICD-10-CM | POA: Diagnosis not present

## 2024-05-16 DIAGNOSIS — N186 End stage renal disease: Secondary | ICD-10-CM | POA: Diagnosis not present

## 2024-05-16 DIAGNOSIS — D509 Iron deficiency anemia, unspecified: Secondary | ICD-10-CM | POA: Diagnosis not present

## 2024-05-16 DIAGNOSIS — Z992 Dependence on renal dialysis: Secondary | ICD-10-CM | POA: Diagnosis not present

## 2024-05-19 DIAGNOSIS — N186 End stage renal disease: Secondary | ICD-10-CM | POA: Diagnosis not present

## 2024-05-19 DIAGNOSIS — E1122 Type 2 diabetes mellitus with diabetic chronic kidney disease: Secondary | ICD-10-CM | POA: Diagnosis not present

## 2024-05-19 DIAGNOSIS — D631 Anemia in chronic kidney disease: Secondary | ICD-10-CM | POA: Diagnosis not present

## 2024-05-19 DIAGNOSIS — D509 Iron deficiency anemia, unspecified: Secondary | ICD-10-CM | POA: Diagnosis not present

## 2024-05-19 DIAGNOSIS — N2581 Secondary hyperparathyroidism of renal origin: Secondary | ICD-10-CM | POA: Diagnosis not present

## 2024-05-19 DIAGNOSIS — Z992 Dependence on renal dialysis: Secondary | ICD-10-CM | POA: Diagnosis not present

## 2024-05-21 DIAGNOSIS — D509 Iron deficiency anemia, unspecified: Secondary | ICD-10-CM | POA: Diagnosis not present

## 2024-05-21 DIAGNOSIS — Z992 Dependence on renal dialysis: Secondary | ICD-10-CM | POA: Diagnosis not present

## 2024-05-21 DIAGNOSIS — E1122 Type 2 diabetes mellitus with diabetic chronic kidney disease: Secondary | ICD-10-CM | POA: Diagnosis not present

## 2024-05-21 DIAGNOSIS — N2581 Secondary hyperparathyroidism of renal origin: Secondary | ICD-10-CM | POA: Diagnosis not present

## 2024-05-21 DIAGNOSIS — D631 Anemia in chronic kidney disease: Secondary | ICD-10-CM | POA: Diagnosis not present

## 2024-05-21 DIAGNOSIS — N186 End stage renal disease: Secondary | ICD-10-CM | POA: Diagnosis not present

## 2024-05-22 ENCOUNTER — Ambulatory Visit

## 2024-05-22 DIAGNOSIS — I639 Cerebral infarction, unspecified: Secondary | ICD-10-CM | POA: Diagnosis not present

## 2024-05-22 LAB — CUP PACEART REMOTE DEVICE CHECK
Date Time Interrogation Session: 20250529000801
Implantable Pulse Generator Implant Date: 20230201

## 2024-05-23 ENCOUNTER — Ambulatory Visit: Payer: Self-pay | Admitting: Cardiology

## 2024-05-23 DIAGNOSIS — D509 Iron deficiency anemia, unspecified: Secondary | ICD-10-CM | POA: Diagnosis not present

## 2024-05-23 DIAGNOSIS — E1122 Type 2 diabetes mellitus with diabetic chronic kidney disease: Secondary | ICD-10-CM | POA: Diagnosis not present

## 2024-05-23 DIAGNOSIS — D631 Anemia in chronic kidney disease: Secondary | ICD-10-CM | POA: Diagnosis not present

## 2024-05-23 DIAGNOSIS — N186 End stage renal disease: Secondary | ICD-10-CM | POA: Diagnosis not present

## 2024-05-23 DIAGNOSIS — Z992 Dependence on renal dialysis: Secondary | ICD-10-CM | POA: Diagnosis not present

## 2024-05-23 DIAGNOSIS — N2581 Secondary hyperparathyroidism of renal origin: Secondary | ICD-10-CM | POA: Diagnosis not present

## 2024-05-24 DIAGNOSIS — N186 End stage renal disease: Secondary | ICD-10-CM | POA: Diagnosis not present

## 2024-05-24 DIAGNOSIS — I129 Hypertensive chronic kidney disease with stage 1 through stage 4 chronic kidney disease, or unspecified chronic kidney disease: Secondary | ICD-10-CM | POA: Diagnosis not present

## 2024-05-24 DIAGNOSIS — Z992 Dependence on renal dialysis: Secondary | ICD-10-CM | POA: Diagnosis not present

## 2024-05-26 DIAGNOSIS — Z992 Dependence on renal dialysis: Secondary | ICD-10-CM | POA: Diagnosis not present

## 2024-05-26 DIAGNOSIS — D631 Anemia in chronic kidney disease: Secondary | ICD-10-CM | POA: Diagnosis not present

## 2024-05-26 DIAGNOSIS — N2581 Secondary hyperparathyroidism of renal origin: Secondary | ICD-10-CM | POA: Diagnosis not present

## 2024-05-26 DIAGNOSIS — D509 Iron deficiency anemia, unspecified: Secondary | ICD-10-CM | POA: Diagnosis not present

## 2024-05-26 DIAGNOSIS — N186 End stage renal disease: Secondary | ICD-10-CM | POA: Diagnosis not present

## 2024-05-30 DIAGNOSIS — N2581 Secondary hyperparathyroidism of renal origin: Secondary | ICD-10-CM | POA: Diagnosis not present

## 2024-05-30 DIAGNOSIS — N186 End stage renal disease: Secondary | ICD-10-CM | POA: Diagnosis not present

## 2024-05-30 DIAGNOSIS — Z992 Dependence on renal dialysis: Secondary | ICD-10-CM | POA: Diagnosis not present

## 2024-05-30 DIAGNOSIS — D509 Iron deficiency anemia, unspecified: Secondary | ICD-10-CM | POA: Diagnosis not present

## 2024-05-30 DIAGNOSIS — D631 Anemia in chronic kidney disease: Secondary | ICD-10-CM | POA: Diagnosis not present

## 2024-06-02 DIAGNOSIS — N2581 Secondary hyperparathyroidism of renal origin: Secondary | ICD-10-CM | POA: Diagnosis not present

## 2024-06-02 DIAGNOSIS — Z992 Dependence on renal dialysis: Secondary | ICD-10-CM | POA: Diagnosis not present

## 2024-06-02 DIAGNOSIS — N186 End stage renal disease: Secondary | ICD-10-CM | POA: Diagnosis not present

## 2024-06-02 DIAGNOSIS — D509 Iron deficiency anemia, unspecified: Secondary | ICD-10-CM | POA: Diagnosis not present

## 2024-06-02 DIAGNOSIS — D631 Anemia in chronic kidney disease: Secondary | ICD-10-CM | POA: Diagnosis not present

## 2024-06-04 DIAGNOSIS — N186 End stage renal disease: Secondary | ICD-10-CM | POA: Diagnosis not present

## 2024-06-04 DIAGNOSIS — D631 Anemia in chronic kidney disease: Secondary | ICD-10-CM | POA: Diagnosis not present

## 2024-06-04 DIAGNOSIS — D509 Iron deficiency anemia, unspecified: Secondary | ICD-10-CM | POA: Diagnosis not present

## 2024-06-04 DIAGNOSIS — Z992 Dependence on renal dialysis: Secondary | ICD-10-CM | POA: Diagnosis not present

## 2024-06-04 DIAGNOSIS — N2581 Secondary hyperparathyroidism of renal origin: Secondary | ICD-10-CM | POA: Diagnosis not present

## 2024-06-06 DIAGNOSIS — D631 Anemia in chronic kidney disease: Secondary | ICD-10-CM | POA: Diagnosis not present

## 2024-06-06 DIAGNOSIS — Z992 Dependence on renal dialysis: Secondary | ICD-10-CM | POA: Diagnosis not present

## 2024-06-06 DIAGNOSIS — N2581 Secondary hyperparathyroidism of renal origin: Secondary | ICD-10-CM | POA: Diagnosis not present

## 2024-06-06 DIAGNOSIS — D509 Iron deficiency anemia, unspecified: Secondary | ICD-10-CM | POA: Diagnosis not present

## 2024-06-06 DIAGNOSIS — N186 End stage renal disease: Secondary | ICD-10-CM | POA: Diagnosis not present

## 2024-06-09 DIAGNOSIS — D631 Anemia in chronic kidney disease: Secondary | ICD-10-CM | POA: Diagnosis not present

## 2024-06-09 DIAGNOSIS — Z992 Dependence on renal dialysis: Secondary | ICD-10-CM | POA: Diagnosis not present

## 2024-06-09 DIAGNOSIS — D509 Iron deficiency anemia, unspecified: Secondary | ICD-10-CM | POA: Diagnosis not present

## 2024-06-09 DIAGNOSIS — N186 End stage renal disease: Secondary | ICD-10-CM | POA: Diagnosis not present

## 2024-06-09 DIAGNOSIS — N2581 Secondary hyperparathyroidism of renal origin: Secondary | ICD-10-CM | POA: Diagnosis not present

## 2024-06-11 DIAGNOSIS — D509 Iron deficiency anemia, unspecified: Secondary | ICD-10-CM | POA: Diagnosis not present

## 2024-06-11 DIAGNOSIS — N186 End stage renal disease: Secondary | ICD-10-CM | POA: Diagnosis not present

## 2024-06-11 DIAGNOSIS — D631 Anemia in chronic kidney disease: Secondary | ICD-10-CM | POA: Diagnosis not present

## 2024-06-11 DIAGNOSIS — N2581 Secondary hyperparathyroidism of renal origin: Secondary | ICD-10-CM | POA: Diagnosis not present

## 2024-06-11 DIAGNOSIS — Z992 Dependence on renal dialysis: Secondary | ICD-10-CM | POA: Diagnosis not present

## 2024-06-11 NOTE — Progress Notes (Signed)
 Carelink Summary Report / Loop Recorder

## 2024-06-13 DIAGNOSIS — N186 End stage renal disease: Secondary | ICD-10-CM | POA: Diagnosis not present

## 2024-06-13 DIAGNOSIS — Z992 Dependence on renal dialysis: Secondary | ICD-10-CM | POA: Diagnosis not present

## 2024-06-13 DIAGNOSIS — D631 Anemia in chronic kidney disease: Secondary | ICD-10-CM | POA: Diagnosis not present

## 2024-06-13 DIAGNOSIS — D509 Iron deficiency anemia, unspecified: Secondary | ICD-10-CM | POA: Diagnosis not present

## 2024-06-13 DIAGNOSIS — N2581 Secondary hyperparathyroidism of renal origin: Secondary | ICD-10-CM | POA: Diagnosis not present

## 2024-06-16 DIAGNOSIS — N186 End stage renal disease: Secondary | ICD-10-CM | POA: Diagnosis not present

## 2024-06-16 DIAGNOSIS — D509 Iron deficiency anemia, unspecified: Secondary | ICD-10-CM | POA: Diagnosis not present

## 2024-06-16 DIAGNOSIS — N2581 Secondary hyperparathyroidism of renal origin: Secondary | ICD-10-CM | POA: Diagnosis not present

## 2024-06-16 DIAGNOSIS — Z992 Dependence on renal dialysis: Secondary | ICD-10-CM | POA: Diagnosis not present

## 2024-06-16 DIAGNOSIS — D631 Anemia in chronic kidney disease: Secondary | ICD-10-CM | POA: Diagnosis not present

## 2024-06-18 DIAGNOSIS — N2581 Secondary hyperparathyroidism of renal origin: Secondary | ICD-10-CM | POA: Diagnosis not present

## 2024-06-18 DIAGNOSIS — Z992 Dependence on renal dialysis: Secondary | ICD-10-CM | POA: Diagnosis not present

## 2024-06-18 DIAGNOSIS — N186 End stage renal disease: Secondary | ICD-10-CM | POA: Diagnosis not present

## 2024-06-18 DIAGNOSIS — D509 Iron deficiency anemia, unspecified: Secondary | ICD-10-CM | POA: Diagnosis not present

## 2024-06-18 DIAGNOSIS — D631 Anemia in chronic kidney disease: Secondary | ICD-10-CM | POA: Diagnosis not present

## 2024-06-20 DIAGNOSIS — N2581 Secondary hyperparathyroidism of renal origin: Secondary | ICD-10-CM | POA: Diagnosis not present

## 2024-06-20 DIAGNOSIS — N186 End stage renal disease: Secondary | ICD-10-CM | POA: Diagnosis not present

## 2024-06-20 DIAGNOSIS — D631 Anemia in chronic kidney disease: Secondary | ICD-10-CM | POA: Diagnosis not present

## 2024-06-20 DIAGNOSIS — Z992 Dependence on renal dialysis: Secondary | ICD-10-CM | POA: Diagnosis not present

## 2024-06-20 DIAGNOSIS — D509 Iron deficiency anemia, unspecified: Secondary | ICD-10-CM | POA: Diagnosis not present

## 2024-06-23 ENCOUNTER — Ambulatory Visit

## 2024-06-23 ENCOUNTER — Ambulatory Visit: Payer: Self-pay | Admitting: Cardiology

## 2024-06-23 DIAGNOSIS — Z992 Dependence on renal dialysis: Secondary | ICD-10-CM | POA: Diagnosis not present

## 2024-06-23 DIAGNOSIS — D509 Iron deficiency anemia, unspecified: Secondary | ICD-10-CM | POA: Diagnosis not present

## 2024-06-23 DIAGNOSIS — N186 End stage renal disease: Secondary | ICD-10-CM | POA: Diagnosis not present

## 2024-06-23 DIAGNOSIS — I639 Cerebral infarction, unspecified: Secondary | ICD-10-CM | POA: Diagnosis not present

## 2024-06-23 DIAGNOSIS — I129 Hypertensive chronic kidney disease with stage 1 through stage 4 chronic kidney disease, or unspecified chronic kidney disease: Secondary | ICD-10-CM | POA: Diagnosis not present

## 2024-06-23 DIAGNOSIS — N2581 Secondary hyperparathyroidism of renal origin: Secondary | ICD-10-CM | POA: Diagnosis not present

## 2024-06-23 DIAGNOSIS — D631 Anemia in chronic kidney disease: Secondary | ICD-10-CM | POA: Diagnosis not present

## 2024-06-23 LAB — CUP PACEART REMOTE DEVICE CHECK
Date Time Interrogation Session: 20250630001028
Implantable Pulse Generator Implant Date: 20230201

## 2024-06-24 DIAGNOSIS — E0590 Thyrotoxicosis, unspecified without thyrotoxic crisis or storm: Secondary | ICD-10-CM | POA: Diagnosis not present

## 2024-06-24 DIAGNOSIS — M810 Age-related osteoporosis without current pathological fracture: Secondary | ICD-10-CM | POA: Diagnosis not present

## 2024-06-25 DIAGNOSIS — Z992 Dependence on renal dialysis: Secondary | ICD-10-CM | POA: Diagnosis not present

## 2024-06-25 DIAGNOSIS — D509 Iron deficiency anemia, unspecified: Secondary | ICD-10-CM | POA: Diagnosis not present

## 2024-06-25 DIAGNOSIS — N186 End stage renal disease: Secondary | ICD-10-CM | POA: Diagnosis not present

## 2024-06-25 DIAGNOSIS — N2581 Secondary hyperparathyroidism of renal origin: Secondary | ICD-10-CM | POA: Diagnosis not present

## 2024-06-25 DIAGNOSIS — D631 Anemia in chronic kidney disease: Secondary | ICD-10-CM | POA: Diagnosis not present

## 2024-06-26 DIAGNOSIS — Z992 Dependence on renal dialysis: Secondary | ICD-10-CM | POA: Diagnosis not present

## 2024-06-26 DIAGNOSIS — N186 End stage renal disease: Secondary | ICD-10-CM | POA: Diagnosis not present

## 2024-06-26 DIAGNOSIS — N2581 Secondary hyperparathyroidism of renal origin: Secondary | ICD-10-CM | POA: Diagnosis not present

## 2024-06-26 DIAGNOSIS — D631 Anemia in chronic kidney disease: Secondary | ICD-10-CM | POA: Diagnosis not present

## 2024-06-26 DIAGNOSIS — D509 Iron deficiency anemia, unspecified: Secondary | ICD-10-CM | POA: Diagnosis not present

## 2024-06-30 DIAGNOSIS — D631 Anemia in chronic kidney disease: Secondary | ICD-10-CM | POA: Diagnosis not present

## 2024-06-30 DIAGNOSIS — N186 End stage renal disease: Secondary | ICD-10-CM | POA: Diagnosis not present

## 2024-06-30 DIAGNOSIS — N2581 Secondary hyperparathyroidism of renal origin: Secondary | ICD-10-CM | POA: Diagnosis not present

## 2024-06-30 DIAGNOSIS — Z992 Dependence on renal dialysis: Secondary | ICD-10-CM | POA: Diagnosis not present

## 2024-06-30 DIAGNOSIS — D509 Iron deficiency anemia, unspecified: Secondary | ICD-10-CM | POA: Diagnosis not present

## 2024-07-02 DIAGNOSIS — N186 End stage renal disease: Secondary | ICD-10-CM | POA: Diagnosis not present

## 2024-07-02 DIAGNOSIS — D631 Anemia in chronic kidney disease: Secondary | ICD-10-CM | POA: Diagnosis not present

## 2024-07-02 DIAGNOSIS — N2581 Secondary hyperparathyroidism of renal origin: Secondary | ICD-10-CM | POA: Diagnosis not present

## 2024-07-02 DIAGNOSIS — Z992 Dependence on renal dialysis: Secondary | ICD-10-CM | POA: Diagnosis not present

## 2024-07-02 DIAGNOSIS — D509 Iron deficiency anemia, unspecified: Secondary | ICD-10-CM | POA: Diagnosis not present

## 2024-07-04 DIAGNOSIS — D631 Anemia in chronic kidney disease: Secondary | ICD-10-CM | POA: Diagnosis not present

## 2024-07-04 DIAGNOSIS — D509 Iron deficiency anemia, unspecified: Secondary | ICD-10-CM | POA: Diagnosis not present

## 2024-07-04 DIAGNOSIS — N186 End stage renal disease: Secondary | ICD-10-CM | POA: Diagnosis not present

## 2024-07-04 DIAGNOSIS — N2581 Secondary hyperparathyroidism of renal origin: Secondary | ICD-10-CM | POA: Diagnosis not present

## 2024-07-04 DIAGNOSIS — Z992 Dependence on renal dialysis: Secondary | ICD-10-CM | POA: Diagnosis not present

## 2024-07-07 DIAGNOSIS — E0590 Thyrotoxicosis, unspecified without thyrotoxic crisis or storm: Secondary | ICD-10-CM | POA: Diagnosis not present

## 2024-07-07 DIAGNOSIS — I129 Hypertensive chronic kidney disease with stage 1 through stage 4 chronic kidney disease, or unspecified chronic kidney disease: Secondary | ICD-10-CM | POA: Diagnosis not present

## 2024-07-07 DIAGNOSIS — E1122 Type 2 diabetes mellitus with diabetic chronic kidney disease: Secondary | ICD-10-CM | POA: Diagnosis not present

## 2024-07-07 DIAGNOSIS — Z992 Dependence on renal dialysis: Secondary | ICD-10-CM | POA: Diagnosis not present

## 2024-07-07 DIAGNOSIS — F17200 Nicotine dependence, unspecified, uncomplicated: Secondary | ICD-10-CM | POA: Diagnosis not present

## 2024-07-07 DIAGNOSIS — G4700 Insomnia, unspecified: Secondary | ICD-10-CM | POA: Diagnosis not present

## 2024-07-07 DIAGNOSIS — Z23 Encounter for immunization: Secondary | ICD-10-CM | POA: Diagnosis not present

## 2024-07-07 DIAGNOSIS — D509 Iron deficiency anemia, unspecified: Secondary | ICD-10-CM | POA: Diagnosis not present

## 2024-07-07 DIAGNOSIS — Z0000 Encounter for general adult medical examination without abnormal findings: Secondary | ICD-10-CM | POA: Diagnosis not present

## 2024-07-07 DIAGNOSIS — G2581 Restless legs syndrome: Secondary | ICD-10-CM | POA: Diagnosis not present

## 2024-07-07 DIAGNOSIS — E782 Mixed hyperlipidemia: Secondary | ICD-10-CM | POA: Diagnosis not present

## 2024-07-07 DIAGNOSIS — N2581 Secondary hyperparathyroidism of renal origin: Secondary | ICD-10-CM | POA: Diagnosis not present

## 2024-07-07 DIAGNOSIS — K219 Gastro-esophageal reflux disease without esophagitis: Secondary | ICD-10-CM | POA: Diagnosis not present

## 2024-07-07 DIAGNOSIS — N186 End stage renal disease: Secondary | ICD-10-CM | POA: Diagnosis not present

## 2024-07-07 DIAGNOSIS — I10 Essential (primary) hypertension: Secondary | ICD-10-CM | POA: Diagnosis not present

## 2024-07-07 DIAGNOSIS — E1142 Type 2 diabetes mellitus with diabetic polyneuropathy: Secondary | ICD-10-CM | POA: Diagnosis not present

## 2024-07-07 DIAGNOSIS — D631 Anemia in chronic kidney disease: Secondary | ICD-10-CM | POA: Diagnosis not present

## 2024-07-09 DIAGNOSIS — D509 Iron deficiency anemia, unspecified: Secondary | ICD-10-CM | POA: Diagnosis not present

## 2024-07-09 DIAGNOSIS — N2581 Secondary hyperparathyroidism of renal origin: Secondary | ICD-10-CM | POA: Diagnosis not present

## 2024-07-09 DIAGNOSIS — N186 End stage renal disease: Secondary | ICD-10-CM | POA: Diagnosis not present

## 2024-07-09 DIAGNOSIS — Z992 Dependence on renal dialysis: Secondary | ICD-10-CM | POA: Diagnosis not present

## 2024-07-09 DIAGNOSIS — D631 Anemia in chronic kidney disease: Secondary | ICD-10-CM | POA: Diagnosis not present

## 2024-07-10 NOTE — Progress Notes (Signed)
 Carelink Summary Report / Loop Recorder

## 2024-07-11 DIAGNOSIS — Z992 Dependence on renal dialysis: Secondary | ICD-10-CM | POA: Diagnosis not present

## 2024-07-11 DIAGNOSIS — D631 Anemia in chronic kidney disease: Secondary | ICD-10-CM | POA: Diagnosis not present

## 2024-07-11 DIAGNOSIS — N186 End stage renal disease: Secondary | ICD-10-CM | POA: Diagnosis not present

## 2024-07-11 DIAGNOSIS — N2581 Secondary hyperparathyroidism of renal origin: Secondary | ICD-10-CM | POA: Diagnosis not present

## 2024-07-11 DIAGNOSIS — D509 Iron deficiency anemia, unspecified: Secondary | ICD-10-CM | POA: Diagnosis not present

## 2024-07-14 DIAGNOSIS — Z992 Dependence on renal dialysis: Secondary | ICD-10-CM | POA: Diagnosis not present

## 2024-07-14 DIAGNOSIS — D631 Anemia in chronic kidney disease: Secondary | ICD-10-CM | POA: Diagnosis not present

## 2024-07-14 DIAGNOSIS — N186 End stage renal disease: Secondary | ICD-10-CM | POA: Diagnosis not present

## 2024-07-14 DIAGNOSIS — D509 Iron deficiency anemia, unspecified: Secondary | ICD-10-CM | POA: Diagnosis not present

## 2024-07-14 DIAGNOSIS — N2581 Secondary hyperparathyroidism of renal origin: Secondary | ICD-10-CM | POA: Diagnosis not present

## 2024-07-16 DIAGNOSIS — Z992 Dependence on renal dialysis: Secondary | ICD-10-CM | POA: Diagnosis not present

## 2024-07-16 DIAGNOSIS — N186 End stage renal disease: Secondary | ICD-10-CM | POA: Diagnosis not present

## 2024-07-16 DIAGNOSIS — S90412A Abrasion, left great toe, initial encounter: Secondary | ICD-10-CM | POA: Diagnosis not present

## 2024-07-16 DIAGNOSIS — M79675 Pain in left toe(s): Secondary | ICD-10-CM | POA: Diagnosis not present

## 2024-07-16 DIAGNOSIS — N2581 Secondary hyperparathyroidism of renal origin: Secondary | ICD-10-CM | POA: Diagnosis not present

## 2024-07-16 DIAGNOSIS — D631 Anemia in chronic kidney disease: Secondary | ICD-10-CM | POA: Diagnosis not present

## 2024-07-16 DIAGNOSIS — D509 Iron deficiency anemia, unspecified: Secondary | ICD-10-CM | POA: Diagnosis not present

## 2024-07-18 DIAGNOSIS — Z992 Dependence on renal dialysis: Secondary | ICD-10-CM | POA: Diagnosis not present

## 2024-07-18 DIAGNOSIS — D631 Anemia in chronic kidney disease: Secondary | ICD-10-CM | POA: Diagnosis not present

## 2024-07-18 DIAGNOSIS — N186 End stage renal disease: Secondary | ICD-10-CM | POA: Diagnosis not present

## 2024-07-18 DIAGNOSIS — N2581 Secondary hyperparathyroidism of renal origin: Secondary | ICD-10-CM | POA: Diagnosis not present

## 2024-07-18 DIAGNOSIS — D509 Iron deficiency anemia, unspecified: Secondary | ICD-10-CM | POA: Diagnosis not present

## 2024-07-21 DIAGNOSIS — Z992 Dependence on renal dialysis: Secondary | ICD-10-CM | POA: Diagnosis not present

## 2024-07-21 DIAGNOSIS — D631 Anemia in chronic kidney disease: Secondary | ICD-10-CM | POA: Diagnosis not present

## 2024-07-21 DIAGNOSIS — M19072 Primary osteoarthritis, left ankle and foot: Secondary | ICD-10-CM | POA: Diagnosis not present

## 2024-07-21 DIAGNOSIS — N2581 Secondary hyperparathyroidism of renal origin: Secondary | ICD-10-CM | POA: Diagnosis not present

## 2024-07-21 DIAGNOSIS — N186 End stage renal disease: Secondary | ICD-10-CM | POA: Diagnosis not present

## 2024-07-21 DIAGNOSIS — L03032 Cellulitis of left toe: Secondary | ICD-10-CM | POA: Diagnosis not present

## 2024-07-21 DIAGNOSIS — D509 Iron deficiency anemia, unspecified: Secondary | ICD-10-CM | POA: Diagnosis not present

## 2024-07-23 DIAGNOSIS — Z992 Dependence on renal dialysis: Secondary | ICD-10-CM | POA: Diagnosis not present

## 2024-07-23 DIAGNOSIS — D509 Iron deficiency anemia, unspecified: Secondary | ICD-10-CM | POA: Diagnosis not present

## 2024-07-23 DIAGNOSIS — D631 Anemia in chronic kidney disease: Secondary | ICD-10-CM | POA: Diagnosis not present

## 2024-07-23 DIAGNOSIS — N2581 Secondary hyperparathyroidism of renal origin: Secondary | ICD-10-CM | POA: Diagnosis not present

## 2024-07-23 DIAGNOSIS — N186 End stage renal disease: Secondary | ICD-10-CM | POA: Diagnosis not present

## 2024-07-24 ENCOUNTER — Ambulatory Visit (INDEPENDENT_AMBULATORY_CARE_PROVIDER_SITE_OTHER)

## 2024-07-24 DIAGNOSIS — I639 Cerebral infarction, unspecified: Secondary | ICD-10-CM

## 2024-07-24 DIAGNOSIS — N186 End stage renal disease: Secondary | ICD-10-CM | POA: Diagnosis not present

## 2024-07-24 DIAGNOSIS — I129 Hypertensive chronic kidney disease with stage 1 through stage 4 chronic kidney disease, or unspecified chronic kidney disease: Secondary | ICD-10-CM | POA: Diagnosis not present

## 2024-07-24 DIAGNOSIS — Z992 Dependence on renal dialysis: Secondary | ICD-10-CM | POA: Diagnosis not present

## 2024-07-24 LAB — CUP PACEART REMOTE DEVICE CHECK
Date Time Interrogation Session: 20250731001403
Implantable Pulse Generator Implant Date: 20230201

## 2024-07-25 ENCOUNTER — Ambulatory Visit: Payer: Self-pay | Admitting: Cardiology

## 2024-07-25 DIAGNOSIS — D509 Iron deficiency anemia, unspecified: Secondary | ICD-10-CM | POA: Diagnosis not present

## 2024-07-25 DIAGNOSIS — N2581 Secondary hyperparathyroidism of renal origin: Secondary | ICD-10-CM | POA: Diagnosis not present

## 2024-07-25 DIAGNOSIS — D631 Anemia in chronic kidney disease: Secondary | ICD-10-CM | POA: Diagnosis not present

## 2024-07-25 DIAGNOSIS — N186 End stage renal disease: Secondary | ICD-10-CM | POA: Diagnosis not present

## 2024-07-25 DIAGNOSIS — E1122 Type 2 diabetes mellitus with diabetic chronic kidney disease: Secondary | ICD-10-CM | POA: Diagnosis not present

## 2024-07-25 DIAGNOSIS — Z992 Dependence on renal dialysis: Secondary | ICD-10-CM | POA: Diagnosis not present

## 2024-07-28 DIAGNOSIS — D509 Iron deficiency anemia, unspecified: Secondary | ICD-10-CM | POA: Diagnosis not present

## 2024-07-28 DIAGNOSIS — E1122 Type 2 diabetes mellitus with diabetic chronic kidney disease: Secondary | ICD-10-CM | POA: Diagnosis not present

## 2024-07-28 DIAGNOSIS — Z992 Dependence on renal dialysis: Secondary | ICD-10-CM | POA: Diagnosis not present

## 2024-07-28 DIAGNOSIS — D631 Anemia in chronic kidney disease: Secondary | ICD-10-CM | POA: Diagnosis not present

## 2024-07-28 DIAGNOSIS — N186 End stage renal disease: Secondary | ICD-10-CM | POA: Diagnosis not present

## 2024-07-28 DIAGNOSIS — N2581 Secondary hyperparathyroidism of renal origin: Secondary | ICD-10-CM | POA: Diagnosis not present

## 2024-07-30 DIAGNOSIS — E1122 Type 2 diabetes mellitus with diabetic chronic kidney disease: Secondary | ICD-10-CM | POA: Diagnosis not present

## 2024-07-30 DIAGNOSIS — D631 Anemia in chronic kidney disease: Secondary | ICD-10-CM | POA: Diagnosis not present

## 2024-07-30 DIAGNOSIS — N186 End stage renal disease: Secondary | ICD-10-CM | POA: Diagnosis not present

## 2024-07-30 DIAGNOSIS — N2581 Secondary hyperparathyroidism of renal origin: Secondary | ICD-10-CM | POA: Diagnosis not present

## 2024-07-30 DIAGNOSIS — Z992 Dependence on renal dialysis: Secondary | ICD-10-CM | POA: Diagnosis not present

## 2024-07-30 DIAGNOSIS — D509 Iron deficiency anemia, unspecified: Secondary | ICD-10-CM | POA: Diagnosis not present

## 2024-08-01 DIAGNOSIS — Z992 Dependence on renal dialysis: Secondary | ICD-10-CM | POA: Diagnosis not present

## 2024-08-01 DIAGNOSIS — E1122 Type 2 diabetes mellitus with diabetic chronic kidney disease: Secondary | ICD-10-CM | POA: Diagnosis not present

## 2024-08-01 DIAGNOSIS — D509 Iron deficiency anemia, unspecified: Secondary | ICD-10-CM | POA: Diagnosis not present

## 2024-08-01 DIAGNOSIS — N2581 Secondary hyperparathyroidism of renal origin: Secondary | ICD-10-CM | POA: Diagnosis not present

## 2024-08-01 DIAGNOSIS — N186 End stage renal disease: Secondary | ICD-10-CM | POA: Diagnosis not present

## 2024-08-01 DIAGNOSIS — D631 Anemia in chronic kidney disease: Secondary | ICD-10-CM | POA: Diagnosis not present

## 2024-08-04 DIAGNOSIS — N2581 Secondary hyperparathyroidism of renal origin: Secondary | ICD-10-CM | POA: Diagnosis not present

## 2024-08-04 DIAGNOSIS — D509 Iron deficiency anemia, unspecified: Secondary | ICD-10-CM | POA: Diagnosis not present

## 2024-08-04 DIAGNOSIS — N186 End stage renal disease: Secondary | ICD-10-CM | POA: Diagnosis not present

## 2024-08-04 DIAGNOSIS — Z992 Dependence on renal dialysis: Secondary | ICD-10-CM | POA: Diagnosis not present

## 2024-08-04 DIAGNOSIS — D631 Anemia in chronic kidney disease: Secondary | ICD-10-CM | POA: Diagnosis not present

## 2024-08-04 DIAGNOSIS — E1122 Type 2 diabetes mellitus with diabetic chronic kidney disease: Secondary | ICD-10-CM | POA: Diagnosis not present

## 2024-08-06 DIAGNOSIS — Z992 Dependence on renal dialysis: Secondary | ICD-10-CM | POA: Diagnosis not present

## 2024-08-06 DIAGNOSIS — D631 Anemia in chronic kidney disease: Secondary | ICD-10-CM | POA: Diagnosis not present

## 2024-08-06 DIAGNOSIS — D509 Iron deficiency anemia, unspecified: Secondary | ICD-10-CM | POA: Diagnosis not present

## 2024-08-06 DIAGNOSIS — N186 End stage renal disease: Secondary | ICD-10-CM | POA: Diagnosis not present

## 2024-08-06 DIAGNOSIS — N2581 Secondary hyperparathyroidism of renal origin: Secondary | ICD-10-CM | POA: Diagnosis not present

## 2024-08-06 DIAGNOSIS — E1122 Type 2 diabetes mellitus with diabetic chronic kidney disease: Secondary | ICD-10-CM | POA: Diagnosis not present

## 2024-08-07 DIAGNOSIS — D631 Anemia in chronic kidney disease: Secondary | ICD-10-CM | POA: Diagnosis not present

## 2024-08-07 DIAGNOSIS — N186 End stage renal disease: Secondary | ICD-10-CM | POA: Diagnosis not present

## 2024-08-07 DIAGNOSIS — E1122 Type 2 diabetes mellitus with diabetic chronic kidney disease: Secondary | ICD-10-CM | POA: Diagnosis not present

## 2024-08-07 DIAGNOSIS — N2581 Secondary hyperparathyroidism of renal origin: Secondary | ICD-10-CM | POA: Diagnosis not present

## 2024-08-07 DIAGNOSIS — Z992 Dependence on renal dialysis: Secondary | ICD-10-CM | POA: Diagnosis not present

## 2024-08-07 DIAGNOSIS — D509 Iron deficiency anemia, unspecified: Secondary | ICD-10-CM | POA: Diagnosis not present

## 2024-08-08 DIAGNOSIS — D509 Iron deficiency anemia, unspecified: Secondary | ICD-10-CM | POA: Diagnosis not present

## 2024-08-08 DIAGNOSIS — Z992 Dependence on renal dialysis: Secondary | ICD-10-CM | POA: Diagnosis not present

## 2024-08-08 DIAGNOSIS — N2581 Secondary hyperparathyroidism of renal origin: Secondary | ICD-10-CM | POA: Diagnosis not present

## 2024-08-08 DIAGNOSIS — E1122 Type 2 diabetes mellitus with diabetic chronic kidney disease: Secondary | ICD-10-CM | POA: Diagnosis not present

## 2024-08-08 DIAGNOSIS — N186 End stage renal disease: Secondary | ICD-10-CM | POA: Diagnosis not present

## 2024-08-08 DIAGNOSIS — D631 Anemia in chronic kidney disease: Secondary | ICD-10-CM | POA: Diagnosis not present

## 2024-08-11 DIAGNOSIS — D509 Iron deficiency anemia, unspecified: Secondary | ICD-10-CM | POA: Diagnosis not present

## 2024-08-11 DIAGNOSIS — N186 End stage renal disease: Secondary | ICD-10-CM | POA: Diagnosis not present

## 2024-08-11 DIAGNOSIS — N2581 Secondary hyperparathyroidism of renal origin: Secondary | ICD-10-CM | POA: Diagnosis not present

## 2024-08-11 DIAGNOSIS — Z992 Dependence on renal dialysis: Secondary | ICD-10-CM | POA: Diagnosis not present

## 2024-08-11 DIAGNOSIS — E1122 Type 2 diabetes mellitus with diabetic chronic kidney disease: Secondary | ICD-10-CM | POA: Diagnosis not present

## 2024-08-11 DIAGNOSIS — D631 Anemia in chronic kidney disease: Secondary | ICD-10-CM | POA: Diagnosis not present

## 2024-08-13 DIAGNOSIS — Z992 Dependence on renal dialysis: Secondary | ICD-10-CM | POA: Diagnosis not present

## 2024-08-13 DIAGNOSIS — D631 Anemia in chronic kidney disease: Secondary | ICD-10-CM | POA: Diagnosis not present

## 2024-08-13 DIAGNOSIS — D509 Iron deficiency anemia, unspecified: Secondary | ICD-10-CM | POA: Diagnosis not present

## 2024-08-13 DIAGNOSIS — N186 End stage renal disease: Secondary | ICD-10-CM | POA: Diagnosis not present

## 2024-08-13 DIAGNOSIS — N2581 Secondary hyperparathyroidism of renal origin: Secondary | ICD-10-CM | POA: Diagnosis not present

## 2024-08-13 DIAGNOSIS — E1122 Type 2 diabetes mellitus with diabetic chronic kidney disease: Secondary | ICD-10-CM | POA: Diagnosis not present

## 2024-08-15 DIAGNOSIS — E1122 Type 2 diabetes mellitus with diabetic chronic kidney disease: Secondary | ICD-10-CM | POA: Diagnosis not present

## 2024-08-15 DIAGNOSIS — N186 End stage renal disease: Secondary | ICD-10-CM | POA: Diagnosis not present

## 2024-08-15 DIAGNOSIS — Z992 Dependence on renal dialysis: Secondary | ICD-10-CM | POA: Diagnosis not present

## 2024-08-15 DIAGNOSIS — N2581 Secondary hyperparathyroidism of renal origin: Secondary | ICD-10-CM | POA: Diagnosis not present

## 2024-08-15 DIAGNOSIS — D509 Iron deficiency anemia, unspecified: Secondary | ICD-10-CM | POA: Diagnosis not present

## 2024-08-15 DIAGNOSIS — D631 Anemia in chronic kidney disease: Secondary | ICD-10-CM | POA: Diagnosis not present

## 2024-08-18 DIAGNOSIS — D631 Anemia in chronic kidney disease: Secondary | ICD-10-CM | POA: Diagnosis not present

## 2024-08-18 DIAGNOSIS — E1122 Type 2 diabetes mellitus with diabetic chronic kidney disease: Secondary | ICD-10-CM | POA: Diagnosis not present

## 2024-08-18 DIAGNOSIS — N186 End stage renal disease: Secondary | ICD-10-CM | POA: Diagnosis not present

## 2024-08-18 DIAGNOSIS — D509 Iron deficiency anemia, unspecified: Secondary | ICD-10-CM | POA: Diagnosis not present

## 2024-08-18 DIAGNOSIS — Z992 Dependence on renal dialysis: Secondary | ICD-10-CM | POA: Diagnosis not present

## 2024-08-18 DIAGNOSIS — N2581 Secondary hyperparathyroidism of renal origin: Secondary | ICD-10-CM | POA: Diagnosis not present

## 2024-08-20 DIAGNOSIS — E1122 Type 2 diabetes mellitus with diabetic chronic kidney disease: Secondary | ICD-10-CM | POA: Diagnosis not present

## 2024-08-20 DIAGNOSIS — D509 Iron deficiency anemia, unspecified: Secondary | ICD-10-CM | POA: Diagnosis not present

## 2024-08-20 DIAGNOSIS — N186 End stage renal disease: Secondary | ICD-10-CM | POA: Diagnosis not present

## 2024-08-20 DIAGNOSIS — N2581 Secondary hyperparathyroidism of renal origin: Secondary | ICD-10-CM | POA: Diagnosis not present

## 2024-08-20 DIAGNOSIS — D631 Anemia in chronic kidney disease: Secondary | ICD-10-CM | POA: Diagnosis not present

## 2024-08-20 DIAGNOSIS — Z992 Dependence on renal dialysis: Secondary | ICD-10-CM | POA: Diagnosis not present

## 2024-08-21 DIAGNOSIS — C4492 Squamous cell carcinoma of skin, unspecified: Secondary | ICD-10-CM | POA: Diagnosis not present

## 2024-08-21 DIAGNOSIS — L989 Disorder of the skin and subcutaneous tissue, unspecified: Secondary | ICD-10-CM | POA: Diagnosis not present

## 2024-08-22 DIAGNOSIS — Z992 Dependence on renal dialysis: Secondary | ICD-10-CM | POA: Diagnosis not present

## 2024-08-22 DIAGNOSIS — N186 End stage renal disease: Secondary | ICD-10-CM | POA: Diagnosis not present

## 2024-08-22 DIAGNOSIS — D631 Anemia in chronic kidney disease: Secondary | ICD-10-CM | POA: Diagnosis not present

## 2024-08-22 DIAGNOSIS — E1122 Type 2 diabetes mellitus with diabetic chronic kidney disease: Secondary | ICD-10-CM | POA: Diagnosis not present

## 2024-08-22 DIAGNOSIS — D509 Iron deficiency anemia, unspecified: Secondary | ICD-10-CM | POA: Diagnosis not present

## 2024-08-22 DIAGNOSIS — N2581 Secondary hyperparathyroidism of renal origin: Secondary | ICD-10-CM | POA: Diagnosis not present

## 2024-08-24 DIAGNOSIS — N186 End stage renal disease: Secondary | ICD-10-CM | POA: Diagnosis not present

## 2024-08-24 DIAGNOSIS — I129 Hypertensive chronic kidney disease with stage 1 through stage 4 chronic kidney disease, or unspecified chronic kidney disease: Secondary | ICD-10-CM | POA: Diagnosis not present

## 2024-08-24 DIAGNOSIS — Z992 Dependence on renal dialysis: Secondary | ICD-10-CM | POA: Diagnosis not present

## 2024-08-25 ENCOUNTER — Ambulatory Visit

## 2024-08-25 DIAGNOSIS — D631 Anemia in chronic kidney disease: Secondary | ICD-10-CM | POA: Diagnosis not present

## 2024-08-25 DIAGNOSIS — Z992 Dependence on renal dialysis: Secondary | ICD-10-CM | POA: Diagnosis not present

## 2024-08-25 DIAGNOSIS — D509 Iron deficiency anemia, unspecified: Secondary | ICD-10-CM | POA: Diagnosis not present

## 2024-08-25 DIAGNOSIS — I639 Cerebral infarction, unspecified: Secondary | ICD-10-CM | POA: Diagnosis not present

## 2024-08-25 DIAGNOSIS — N186 End stage renal disease: Secondary | ICD-10-CM | POA: Diagnosis not present

## 2024-08-25 DIAGNOSIS — N2581 Secondary hyperparathyroidism of renal origin: Secondary | ICD-10-CM | POA: Diagnosis not present

## 2024-08-25 DIAGNOSIS — Z299 Encounter for prophylactic measures, unspecified: Secondary | ICD-10-CM | POA: Diagnosis not present

## 2024-08-27 DIAGNOSIS — N186 End stage renal disease: Secondary | ICD-10-CM | POA: Diagnosis not present

## 2024-08-27 DIAGNOSIS — D631 Anemia in chronic kidney disease: Secondary | ICD-10-CM | POA: Diagnosis not present

## 2024-08-27 DIAGNOSIS — N2581 Secondary hyperparathyroidism of renal origin: Secondary | ICD-10-CM | POA: Diagnosis not present

## 2024-08-27 DIAGNOSIS — Z299 Encounter for prophylactic measures, unspecified: Secondary | ICD-10-CM | POA: Diagnosis not present

## 2024-08-27 DIAGNOSIS — D509 Iron deficiency anemia, unspecified: Secondary | ICD-10-CM | POA: Diagnosis not present

## 2024-08-27 DIAGNOSIS — Z992 Dependence on renal dialysis: Secondary | ICD-10-CM | POA: Diagnosis not present

## 2024-08-27 LAB — CUP PACEART REMOTE DEVICE CHECK
Date Time Interrogation Session: 20250831000713
Implantable Pulse Generator Implant Date: 20230201

## 2024-08-29 ENCOUNTER — Ambulatory Visit: Payer: Self-pay | Admitting: Cardiology

## 2024-08-29 DIAGNOSIS — D631 Anemia in chronic kidney disease: Secondary | ICD-10-CM | POA: Diagnosis not present

## 2024-08-29 DIAGNOSIS — N186 End stage renal disease: Secondary | ICD-10-CM | POA: Diagnosis not present

## 2024-08-29 DIAGNOSIS — Z992 Dependence on renal dialysis: Secondary | ICD-10-CM | POA: Diagnosis not present

## 2024-08-29 DIAGNOSIS — Z299 Encounter for prophylactic measures, unspecified: Secondary | ICD-10-CM | POA: Diagnosis not present

## 2024-08-29 DIAGNOSIS — D509 Iron deficiency anemia, unspecified: Secondary | ICD-10-CM | POA: Diagnosis not present

## 2024-08-29 DIAGNOSIS — N2581 Secondary hyperparathyroidism of renal origin: Secondary | ICD-10-CM | POA: Diagnosis not present

## 2024-09-01 DIAGNOSIS — D509 Iron deficiency anemia, unspecified: Secondary | ICD-10-CM | POA: Diagnosis not present

## 2024-09-01 DIAGNOSIS — N186 End stage renal disease: Secondary | ICD-10-CM | POA: Diagnosis not present

## 2024-09-01 DIAGNOSIS — Z299 Encounter for prophylactic measures, unspecified: Secondary | ICD-10-CM | POA: Diagnosis not present

## 2024-09-01 DIAGNOSIS — D631 Anemia in chronic kidney disease: Secondary | ICD-10-CM | POA: Diagnosis not present

## 2024-09-01 DIAGNOSIS — N2581 Secondary hyperparathyroidism of renal origin: Secondary | ICD-10-CM | POA: Diagnosis not present

## 2024-09-01 DIAGNOSIS — Z992 Dependence on renal dialysis: Secondary | ICD-10-CM | POA: Diagnosis not present

## 2024-09-02 NOTE — Progress Notes (Signed)
 Remote Loop Recorder Transmission

## 2024-09-03 DIAGNOSIS — N186 End stage renal disease: Secondary | ICD-10-CM | POA: Diagnosis not present

## 2024-09-03 DIAGNOSIS — D631 Anemia in chronic kidney disease: Secondary | ICD-10-CM | POA: Diagnosis not present

## 2024-09-03 DIAGNOSIS — D509 Iron deficiency anemia, unspecified: Secondary | ICD-10-CM | POA: Diagnosis not present

## 2024-09-03 DIAGNOSIS — Z992 Dependence on renal dialysis: Secondary | ICD-10-CM | POA: Diagnosis not present

## 2024-09-03 DIAGNOSIS — N2581 Secondary hyperparathyroidism of renal origin: Secondary | ICD-10-CM | POA: Diagnosis not present

## 2024-09-03 DIAGNOSIS — Z299 Encounter for prophylactic measures, unspecified: Secondary | ICD-10-CM | POA: Diagnosis not present

## 2024-09-05 DIAGNOSIS — D631 Anemia in chronic kidney disease: Secondary | ICD-10-CM | POA: Diagnosis not present

## 2024-09-05 DIAGNOSIS — N2581 Secondary hyperparathyroidism of renal origin: Secondary | ICD-10-CM | POA: Diagnosis not present

## 2024-09-05 DIAGNOSIS — D509 Iron deficiency anemia, unspecified: Secondary | ICD-10-CM | POA: Diagnosis not present

## 2024-09-05 DIAGNOSIS — Z299 Encounter for prophylactic measures, unspecified: Secondary | ICD-10-CM | POA: Diagnosis not present

## 2024-09-05 DIAGNOSIS — Z992 Dependence on renal dialysis: Secondary | ICD-10-CM | POA: Diagnosis not present

## 2024-09-05 DIAGNOSIS — N186 End stage renal disease: Secondary | ICD-10-CM | POA: Diagnosis not present

## 2024-09-08 DIAGNOSIS — Z299 Encounter for prophylactic measures, unspecified: Secondary | ICD-10-CM | POA: Diagnosis not present

## 2024-09-08 DIAGNOSIS — D509 Iron deficiency anemia, unspecified: Secondary | ICD-10-CM | POA: Diagnosis not present

## 2024-09-08 DIAGNOSIS — N186 End stage renal disease: Secondary | ICD-10-CM | POA: Diagnosis not present

## 2024-09-08 DIAGNOSIS — N2581 Secondary hyperparathyroidism of renal origin: Secondary | ICD-10-CM | POA: Diagnosis not present

## 2024-09-08 DIAGNOSIS — Z992 Dependence on renal dialysis: Secondary | ICD-10-CM | POA: Diagnosis not present

## 2024-09-08 DIAGNOSIS — D631 Anemia in chronic kidney disease: Secondary | ICD-10-CM | POA: Diagnosis not present

## 2024-09-10 DIAGNOSIS — Z299 Encounter for prophylactic measures, unspecified: Secondary | ICD-10-CM | POA: Diagnosis not present

## 2024-09-10 DIAGNOSIS — Z992 Dependence on renal dialysis: Secondary | ICD-10-CM | POA: Diagnosis not present

## 2024-09-10 DIAGNOSIS — D631 Anemia in chronic kidney disease: Secondary | ICD-10-CM | POA: Diagnosis not present

## 2024-09-10 DIAGNOSIS — D509 Iron deficiency anemia, unspecified: Secondary | ICD-10-CM | POA: Diagnosis not present

## 2024-09-10 DIAGNOSIS — N2581 Secondary hyperparathyroidism of renal origin: Secondary | ICD-10-CM | POA: Diagnosis not present

## 2024-09-10 DIAGNOSIS — N186 End stage renal disease: Secondary | ICD-10-CM | POA: Diagnosis not present

## 2024-09-12 DIAGNOSIS — Z299 Encounter for prophylactic measures, unspecified: Secondary | ICD-10-CM | POA: Diagnosis not present

## 2024-09-12 DIAGNOSIS — D631 Anemia in chronic kidney disease: Secondary | ICD-10-CM | POA: Diagnosis not present

## 2024-09-12 DIAGNOSIS — D509 Iron deficiency anemia, unspecified: Secondary | ICD-10-CM | POA: Diagnosis not present

## 2024-09-12 DIAGNOSIS — N186 End stage renal disease: Secondary | ICD-10-CM | POA: Diagnosis not present

## 2024-09-12 DIAGNOSIS — Z992 Dependence on renal dialysis: Secondary | ICD-10-CM | POA: Diagnosis not present

## 2024-09-12 DIAGNOSIS — N2581 Secondary hyperparathyroidism of renal origin: Secondary | ICD-10-CM | POA: Diagnosis not present

## 2024-09-15 DIAGNOSIS — D509 Iron deficiency anemia, unspecified: Secondary | ICD-10-CM | POA: Diagnosis not present

## 2024-09-15 DIAGNOSIS — N2581 Secondary hyperparathyroidism of renal origin: Secondary | ICD-10-CM | POA: Diagnosis not present

## 2024-09-15 DIAGNOSIS — N186 End stage renal disease: Secondary | ICD-10-CM | POA: Diagnosis not present

## 2024-09-15 DIAGNOSIS — D631 Anemia in chronic kidney disease: Secondary | ICD-10-CM | POA: Diagnosis not present

## 2024-09-15 DIAGNOSIS — Z299 Encounter for prophylactic measures, unspecified: Secondary | ICD-10-CM | POA: Diagnosis not present

## 2024-09-15 DIAGNOSIS — Z992 Dependence on renal dialysis: Secondary | ICD-10-CM | POA: Diagnosis not present

## 2024-09-17 DIAGNOSIS — N186 End stage renal disease: Secondary | ICD-10-CM | POA: Diagnosis not present

## 2024-09-17 DIAGNOSIS — Z299 Encounter for prophylactic measures, unspecified: Secondary | ICD-10-CM | POA: Diagnosis not present

## 2024-09-17 DIAGNOSIS — D631 Anemia in chronic kidney disease: Secondary | ICD-10-CM | POA: Diagnosis not present

## 2024-09-17 DIAGNOSIS — Z992 Dependence on renal dialysis: Secondary | ICD-10-CM | POA: Diagnosis not present

## 2024-09-17 DIAGNOSIS — N2581 Secondary hyperparathyroidism of renal origin: Secondary | ICD-10-CM | POA: Diagnosis not present

## 2024-09-17 DIAGNOSIS — D509 Iron deficiency anemia, unspecified: Secondary | ICD-10-CM | POA: Diagnosis not present

## 2024-09-19 DIAGNOSIS — L309 Dermatitis, unspecified: Secondary | ICD-10-CM | POA: Diagnosis not present

## 2024-09-19 DIAGNOSIS — N186 End stage renal disease: Secondary | ICD-10-CM | POA: Diagnosis not present

## 2024-09-19 DIAGNOSIS — D485 Neoplasm of uncertain behavior of skin: Secondary | ICD-10-CM | POA: Diagnosis not present

## 2024-09-19 DIAGNOSIS — Z992 Dependence on renal dialysis: Secondary | ICD-10-CM | POA: Diagnosis not present

## 2024-09-19 DIAGNOSIS — L817 Pigmented purpuric dermatosis: Secondary | ICD-10-CM | POA: Diagnosis not present

## 2024-09-19 DIAGNOSIS — L821 Other seborrheic keratosis: Secondary | ICD-10-CM | POA: Diagnosis not present

## 2024-09-19 DIAGNOSIS — D509 Iron deficiency anemia, unspecified: Secondary | ICD-10-CM | POA: Diagnosis not present

## 2024-09-19 DIAGNOSIS — N2581 Secondary hyperparathyroidism of renal origin: Secondary | ICD-10-CM | POA: Diagnosis not present

## 2024-09-19 DIAGNOSIS — Z299 Encounter for prophylactic measures, unspecified: Secondary | ICD-10-CM | POA: Diagnosis not present

## 2024-09-19 DIAGNOSIS — D631 Anemia in chronic kidney disease: Secondary | ICD-10-CM | POA: Diagnosis not present

## 2024-09-22 DIAGNOSIS — D631 Anemia in chronic kidney disease: Secondary | ICD-10-CM | POA: Diagnosis not present

## 2024-09-22 DIAGNOSIS — N186 End stage renal disease: Secondary | ICD-10-CM | POA: Diagnosis not present

## 2024-09-22 DIAGNOSIS — D509 Iron deficiency anemia, unspecified: Secondary | ICD-10-CM | POA: Diagnosis not present

## 2024-09-22 DIAGNOSIS — Z299 Encounter for prophylactic measures, unspecified: Secondary | ICD-10-CM | POA: Diagnosis not present

## 2024-09-22 DIAGNOSIS — Z992 Dependence on renal dialysis: Secondary | ICD-10-CM | POA: Diagnosis not present

## 2024-09-22 DIAGNOSIS — N2581 Secondary hyperparathyroidism of renal origin: Secondary | ICD-10-CM | POA: Diagnosis not present

## 2024-09-23 DIAGNOSIS — Z992 Dependence on renal dialysis: Secondary | ICD-10-CM | POA: Diagnosis not present

## 2024-09-23 DIAGNOSIS — I129 Hypertensive chronic kidney disease with stage 1 through stage 4 chronic kidney disease, or unspecified chronic kidney disease: Secondary | ICD-10-CM | POA: Diagnosis not present

## 2024-09-23 DIAGNOSIS — N186 End stage renal disease: Secondary | ICD-10-CM | POA: Diagnosis not present

## 2024-09-23 NOTE — Progress Notes (Signed)
 Remote Loop Recorder Transmission

## 2024-09-24 NOTE — Progress Notes (Signed)
 Remote Loop Recorder Transmission

## 2024-09-25 ENCOUNTER — Ambulatory Visit

## 2024-09-25 DIAGNOSIS — I639 Cerebral infarction, unspecified: Secondary | ICD-10-CM | POA: Diagnosis not present

## 2024-09-25 LAB — CUP PACEART REMOTE DEVICE CHECK
Date Time Interrogation Session: 20251002000732
Implantable Pulse Generator Implant Date: 20230201

## 2024-09-29 ENCOUNTER — Ambulatory Visit: Payer: Self-pay | Admitting: Cardiology

## 2024-09-29 NOTE — Progress Notes (Signed)
 Remote Loop Recorder Transmission

## 2024-10-09 DIAGNOSIS — E0590 Thyrotoxicosis, unspecified without thyrotoxic crisis or storm: Secondary | ICD-10-CM | POA: Diagnosis not present

## 2024-10-15 DIAGNOSIS — L309 Dermatitis, unspecified: Secondary | ICD-10-CM | POA: Diagnosis not present

## 2024-10-15 DIAGNOSIS — D692 Other nonthrombocytopenic purpura: Secondary | ICD-10-CM | POA: Diagnosis not present

## 2024-10-27 ENCOUNTER — Ambulatory Visit

## 2024-10-27 DIAGNOSIS — I639 Cerebral infarction, unspecified: Secondary | ICD-10-CM

## 2024-10-27 LAB — CUP PACEART REMOTE DEVICE CHECK
Date Time Interrogation Session: 20251103000746
Implantable Pulse Generator Implant Date: 20230201

## 2024-10-28 ENCOUNTER — Ambulatory Visit: Payer: Self-pay | Admitting: Cardiology

## 2024-10-29 NOTE — Progress Notes (Signed)
 Remote Loop Recorder Transmission

## 2024-11-14 ENCOUNTER — Emergency Department (HOSPITAL_COMMUNITY)

## 2024-11-14 ENCOUNTER — Observation Stay (HOSPITAL_COMMUNITY)
Admission: EM | Admit: 2024-11-14 | Discharge: 2024-11-16 | Disposition: A | Discharge status: Home / Self Care | Attending: Emergency Medicine | Admitting: Emergency Medicine

## 2024-11-14 ENCOUNTER — Encounter (HOSPITAL_COMMUNITY): Payer: Self-pay

## 2024-11-14 DIAGNOSIS — Z7982 Long term (current) use of aspirin: Secondary | ICD-10-CM | POA: Diagnosis not present

## 2024-11-14 DIAGNOSIS — R2681 Unsteadiness on feet: Secondary | ICD-10-CM | POA: Diagnosis present

## 2024-11-14 DIAGNOSIS — Z7901 Long term (current) use of anticoagulants: Secondary | ICD-10-CM | POA: Diagnosis not present

## 2024-11-14 DIAGNOSIS — R42 Dizziness and giddiness: Principal | ICD-10-CM

## 2024-11-14 DIAGNOSIS — Z79899 Other long term (current) drug therapy: Secondary | ICD-10-CM | POA: Diagnosis not present

## 2024-11-14 DIAGNOSIS — K219 Gastro-esophageal reflux disease without esophagitis: Secondary | ICD-10-CM | POA: Diagnosis present

## 2024-11-14 DIAGNOSIS — N186 End stage renal disease: Secondary | ICD-10-CM | POA: Insufficient documentation

## 2024-11-14 DIAGNOSIS — D631 Anemia in chronic kidney disease: Secondary | ICD-10-CM | POA: Diagnosis not present

## 2024-11-14 DIAGNOSIS — G2581 Restless legs syndrome: Secondary | ICD-10-CM | POA: Diagnosis present

## 2024-11-14 DIAGNOSIS — Z992 Dependence on renal dialysis: Secondary | ICD-10-CM | POA: Insufficient documentation

## 2024-11-14 DIAGNOSIS — I6389 Other cerebral infarction: Principal | ICD-10-CM | POA: Insufficient documentation

## 2024-11-14 DIAGNOSIS — E0590 Thyrotoxicosis, unspecified without thyrotoxic crisis or storm: Secondary | ICD-10-CM | POA: Diagnosis not present

## 2024-11-14 DIAGNOSIS — I120 Hypertensive chronic kidney disease with stage 5 chronic kidney disease or end stage renal disease: Secondary | ICD-10-CM | POA: Insufficient documentation

## 2024-11-14 DIAGNOSIS — F17200 Nicotine dependence, unspecified, uncomplicated: Secondary | ICD-10-CM | POA: Insufficient documentation

## 2024-11-14 DIAGNOSIS — I639 Cerebral infarction, unspecified: Secondary | ICD-10-CM | POA: Diagnosis present

## 2024-11-14 DIAGNOSIS — I10 Essential (primary) hypertension: Secondary | ICD-10-CM | POA: Diagnosis present

## 2024-11-14 DIAGNOSIS — F17210 Nicotine dependence, cigarettes, uncomplicated: Secondary | ICD-10-CM | POA: Diagnosis not present

## 2024-11-14 DIAGNOSIS — E1122 Type 2 diabetes mellitus with diabetic chronic kidney disease: Secondary | ICD-10-CM | POA: Insufficient documentation

## 2024-11-14 DIAGNOSIS — E785 Hyperlipidemia, unspecified: Secondary | ICD-10-CM | POA: Diagnosis present

## 2024-11-14 LAB — COMPREHENSIVE METABOLIC PANEL WITH GFR
ALT: 15 U/L (ref 0–44)
AST: 17 U/L (ref 15–41)
Albumin: 3.6 g/dL (ref 3.5–5.0)
Alkaline Phosphatase: 149 U/L — ABNORMAL HIGH (ref 38–126)
Anion gap: 16 — ABNORMAL HIGH (ref 5–15)
BUN: 39 mg/dL — ABNORMAL HIGH (ref 8–23)
CO2: 24 mmol/L (ref 22–32)
Calcium: 9.1 mg/dL (ref 8.9–10.3)
Chloride: 99 mmol/L (ref 98–111)
Creatinine, Ser: 7.11 mg/dL — ABNORMAL HIGH (ref 0.44–1.00)
GFR, Estimated: 5 mL/min — ABNORMAL LOW (ref >60)
Glucose, Bld: 105 mg/dL — ABNORMAL HIGH (ref 70–99)
Potassium: 4 mmol/L (ref 3.5–5.1)
Sodium: 139 mmol/L (ref 135–145)
Total Bilirubin: 0.7 mg/dL (ref 0.0–1.2)
Total Protein: 7.3 g/dL (ref 6.5–8.1)

## 2024-11-14 LAB — CBC
HCT: 29.5 % — ABNORMAL LOW (ref 36.0–46.0)
Hemoglobin: 9.8 g/dL — ABNORMAL LOW (ref 12.0–15.0)
MCH: 29.9 pg (ref 26.0–34.0)
MCHC: 33.2 g/dL (ref 30.0–36.0)
MCV: 89.9 fL (ref 80.0–100.0)
Platelets: 189 K/uL (ref 150–400)
RBC: 3.28 MIL/uL — ABNORMAL LOW (ref 3.87–5.11)
RDW: 17.2 % — ABNORMAL HIGH (ref 11.5–15.5)
WBC: 6.9 K/uL (ref 4.0–10.5)
nRBC: 0 % (ref 0.0–0.2)

## 2024-11-14 LAB — CBG MONITORING, ED: Glucose-Capillary: 108 mg/dL — ABNORMAL HIGH (ref 70–99)

## 2024-11-14 NOTE — ED Triage Notes (Signed)
 Pt c/o dizziness starting last night.  Pt reports difficulty going from sitting to standing.  Denies SOB and n/v/d.  Denies pain.

## 2024-11-14 NOTE — ED Notes (Signed)
 Pt to MRI

## 2024-11-14 NOTE — ED Triage Notes (Signed)
 Pt referred to ED from [facility] for CT scan. Pt started feeling dizzy last night. Pt is MWF dialysis pt but missed her appointment today. Last tx on 11/19.

## 2024-11-14 NOTE — Discharge Instructions (Signed)
 Follow-up with your regular doctor.  Return for any new or worse symptoms.  Workup here tonight without any acute findings.  Keep your appointment for dialysis on Sunday.

## 2024-11-14 NOTE — ED Provider Triage Note (Signed)
 Emergency Medicine Provider Triage Evaluation Note  Monica Hunt , a 83 y.o. female  was evaluated in triage.  Pt complains of dizziness x 1 day most noticed when going from sitting to standing.  ESRD last had dialysis on Wednesday, missed dialysis today  Review of Systems  Positive: Dizziness Negative: Fever, chills, nausea, vomiting, chest pain, shortness of breath, medicine changes  Physical Exam  BP (!) 172/74 (BP Location: Left Arm)   Pulse 90   Temp 97.8 F (36.6 C)   Resp 17   Ht 5' 2 (1.575 m)   Wt 56.2 kg   SpO2 100%   BMI 22.68 kg/m  Gen:   Awake, no distress, anxious Resp:  Normal effort  MSK:   Moves extremities without difficulty  Other:  EOM intact without nystagmus  Medical Decision Making  Medically screening exam initiated at 4:50 PM.  Appropriate orders placed.  Monica Hunt was informed that the remainder of the evaluation will be completed by another provider, this initial triage assessment does not replace that evaluation, and the importance of remaining in the ED until their evaluation is complete.  Labs and imaging ordered   Monica Hunt 11/14/24 [id number]

## 2024-11-14 NOTE — ED Provider Notes (Addendum)
 [place] EMERGENCY DEPARTMENT AT [facility] Provider Note   CSN: [id number] Arrival date & time: 11/14/24  [id number]     Patient presents with: Dizziness   Monica Hunt is a 83 y.o. female.   Patient sent in from urgent care for CT scan.  Patient started feeling dizzy last night some slight room movement but no room spinning.  Feeling offkilter.  Has continued through today still present.  Patient is a dialysis patient normally dialyzed Monday Wednesdays and Fridays missed her appointment today last dialyzed on November 19.  Patient has a history of CVAs in the past.  Has a loop recorder that was implanted in February 2023.  Last seen for CVAs in January 2023.  Patient denies any speech problems visual problems any weakness or numbness upper extremity lower extremity.  Patient not on any particular blood thinner but is on aspirin  a day.  Past medical history sniffer hypertension diabetes chronic kidney disease now end-stage renal disease on dialysis Monday Wednesday and Friday stroke in the past in January 2023.  Hypothyroidism.       Prior to Admission medications   Medication Sig Start Date End Date Taking? Authorizing Provider  ACCU-CHEK SMARTVIEW test strip  01/12/20   [provider]  acetaminophen  (TYLENOL ) 500 MG tablet Take 1,000 mg by mouth every 6 (six) hours as needed for mild pain.    [provider]  albuterol (VENTOLIN HFA) 108 (90 Base) MCG/ACT inhaler Inhale 1-2 puffs into the lungs every 6 (six) hours as needed. Patient not taking: Reported on 10/24/2023 06/03/23   [provider]  amLODipine  (NORVASC ) 2.5 MG tablet Take 1 tablet (2.5 mg total) by mouth daily. 06/25/23   Croitoru, Mihai, MD  aspirin  EC 81 MG tablet Take 1 tablet (81 mg total) by mouth daily. Swallow whole. 01/26/22   Bryn Bernardino NOVAK, MD  atorvastatin  (LIPITOR) 40 MG tablet Take 40 mg by mouth daily.    [provider]  methimazole  (TAPAZOLE ) 5 MG tablet Take 1.5  tablets (7.5 mg total) by mouth daily. 10/24/23   Croitoru, Mihai, MD  midodrine (PROAMATINE) 5 MG tablet Take 5 mg by mouth 3 (three) times daily as needed (if BP is low). 01/10/22   [provider]  Multiple Minerals-Vitamins (CAL-MAG-ZINC-D) TABS Take 1 tablet by mouth daily.    [provider]  Multiple Vitamin (MULTIVITAMIN WITH MINERALS) TABS tablet Take 1 tablet by mouth daily.    [provider]  omeprazole (PRILOSEC) 40 MG capsule Take 40 mg by mouth daily.    [provider]  rOPINIRole  (REQUIP ) 3 MG tablet Take 3 mg by mouth at bedtime. Take 1 tab 1 to 3 hours before bedtime    [provider]  traZODone  (DESYREL ) 50 MG tablet Take 100 mg by mouth at bedtime.    [provider]    Allergies: Bee venom and Wasp venom    Review of Systems  Constitutional:  Negative for chills and fever.  HENT:  Negative for ear pain and sore throat.   Eyes:  Negative for pain and visual disturbance.  Respiratory:  Negative for cough and shortness of breath.   Cardiovascular:  Negative for chest pain and palpitations.  Gastrointestinal:  Negative for abdominal pain and vomiting.  Genitourinary:  Negative for dysuria and hematuria.  Musculoskeletal:  Negative for arthralgias and back pain.  Skin:  Negative for color change and rash.  Neurological:  Positive for dizziness and light-headedness. Negative for seizures, syncope,  speech difficulty, weakness, numbness and headaches.  All other systems reviewed and are negative.   Updated Vital Signs BP (!) 167/77   Pulse 84   Temp 97.9 F (36.6 C) (Oral)   Resp 18   Ht 1.575 m (5' 2)   Wt 56.2 kg   SpO2 99%   BMI 22.68 kg/m   Physical Exam Vitals and nursing note reviewed.  Constitutional:      General: She is not in acute distress.    Appearance: Normal appearance. She is well-developed. She is not ill-appearing.  HENT:     Head: Normocephalic and atraumatic.     Mouth/Throat:      Mouth: Mucous membranes are moist.  Eyes:     Extraocular Movements: Extraocular movements intact.     Conjunctiva/sclera: Conjunctivae normal.     Pupils: Pupils are equal, round, and reactive to light.  Cardiovascular:     Rate and Rhythm: Normal rate and regular rhythm.     Heart sounds: No murmur heard. Pulmonary:     Effort: Pulmonary effort is normal. No respiratory distress.     Breath sounds: Normal breath sounds.  Abdominal:     Palpations: Abdomen is soft.     Tenderness: There is no abdominal tenderness.  Musculoskeletal:        General: No swelling.     Cervical back: Normal range of motion and neck supple.     Right lower leg: No edema.     Left lower leg: No edema.  Skin:    General: Skin is warm and dry.     Capillary Refill: Capillary refill takes less than 2 seconds.  Neurological:     General: No focal deficit present.     Mental Status: She is alert and oriented to person, place, and time.     Cranial Nerves: No cranial nerve deficit.     Sensory: No sensory deficit.     Motor: No weakness.     Coordination: Coordination normal.  Psychiatric:        Mood and Affect: Mood normal.     (all labs ordered are listed, but only abnormal results are displayed) Labs Reviewed  COMPREHENSIVE METABOLIC PANEL WITH GFR - Abnormal; Notable for the following components:      Result Value   Glucose, Bld 105 (*)    BUN 39 (*)    Creatinine, Ser 7.11 (*)    Alkaline Phosphatase 149 (*)    GFR, Estimated 5 (*)    Anion gap 16 (*)    All other components within normal limits  CBC - Abnormal; Notable for the following components:   RBC 3.28 (*)    Hemoglobin 9.8 (*)    HCT 29.5 (*)    RDW 17.2 (*)    All other components within normal limits  CBG MONITORING, ED - Abnormal; Notable for the following components:   Glucose-Capillary 108 (*)    All other components within normal limits    EKG: EKG Interpretation Date/Time:  Friday November 14 2024 16:51:42  EST Ventricular Rate:  80 PR Interval:  162 QRS Duration:  78 QT Interval:  392 QTC Calculation: 452 R Axis:   102  Text Interpretation: Normal sinus rhythm Rightward axis Possible Anterior infarct , age undetermined Abnormal ECG When compared with ECG of 24-Oct-2023 10:23, PREVIOUS ECG IS PRESENT No significant change since last tracing Confirmed by Keywon Mestre [phone]) on 11/14/2024 9:25:20 PM  Radiology: MR Brain Wo Contrast (neuro protocol) Result Date: 11/14/2024  EXAM: MRI Brain Without Contrast 11/14/2024 11:13:52 PM TECHNIQUE: Multiplanar multisequence MRI of the head/brain was performed without the administration of intravenous contrast. COMPARISON: MRI head Jan 24, 2022 CLINICAL HISTORY: Neuro deficit, acute, stroke suspected FINDINGS: BRAIN AND VENTRICLES: Small acute high left frontal cortical infarct. Remote left frontal infarct adjacent to the above acute infarct. Additional remote infarccts in the right frontal lobe and right occipital lobe. Remote corona radiata lacunar infarcts. Remote left cerebellar infarct. Advanced T2 hyperintensities in the white matter, compatible with chronic microvascular ischemic change. No acute intracranial hemorrhage. Many punctate foci of susceptibility artifact that are compatible with chronic microhemorrhages that are predominantly peripheral. No mass. No midline shift. No hydrocephalus. Normal flow voids. ORBITS: No acute abnormality. SINUSES AND MASTOIDS: No acute abnormality. BONES AND SOFT TISSUES: Normal marrow signal. IMPRESSION: 1. Small acute high left frontal cortical infarct. 2. Multiple remote infarcts and advanced chronic microvascular ischemic change. 3. Many chronic microhemorrhages throughout the cerebral and cerebellar hemispheres predominantly peripheral and concerning for cerebral amyloid angiopathy. Electronically signed by: Gilmore Molt MD 11/14/2024 11:43 PM EST RP Workstation: [id number]   DG Chest Port 1 View Result Date:  11/14/2024 CLINICAL DATA:  Dialysis patient EXAM: PORTABLE CHEST 1 VIEW COMPARISON:  Chest x-ray 03/17/2023 FINDINGS: Aloop recorder device overlies the left chest. Right-sided central venous catheter tip projects over the caval atrial junction. The heart is mildly enlarged. The lungs are clear. There is no pleural effusion or pneumothorax. There is a healed left sixth rib fracture. No acute fractures are seen. IMPRESSION: 1. No active disease. 2. Mild cardiomegaly. Electronically Signed   By: Greig Pique M.D.   On: 11/14/2024 22:11   CT HEAD WO CONTRAST Result Date: 11/14/2024 EXAM: CT HEAD WITHOUT CONTRAST 11/14/2024 05:16:58 PM TECHNIQUE: CT of the head was performed without the administration of intravenous contrast. Automated exposure control, iterative reconstruction, and/or weight based adjustment of the mA/kV was utilized to reduce the radiation dose to as low as reasonably achievable. COMPARISON: None available. CLINICAL HISTORY: dizziness FINDINGS: BRAIN AND VENTRICLES: No acute hemorrhage. No evidence of acute infarct. Moderate cerebral and cerebellar volume loss and moderate periventricular white matter disease. Chronic left frontal infarct. Chronic left cerebellar infarct. No hydrocephalus. No extra-axial collection. No mass effect or midline shift. ORBITS: Status post bilateral lens replacement. No acute abnormality. SINUSES: No acute abnormality. SOFT TISSUES AND SKULL: No acute soft tissue abnormality. No skull fracture. VASCULATURE: Moderate calcific atheromatous disease within carotid siphons. IMPRESSION: 1. No acute intracranial abnormality. 2. Moderate volume loss. Moderate chronic microvascular ischemic changes. 3. Chronic left frontal and left cerebellar infarcts. Electronically signed by: Donnice Mania MD 11/14/2024 05:33 PM EST RP Workstation: [id number]     Procedures   Medications Ordered in the ED - No data to display                                  Medical Decision  Making Amount and/or Complexity of Data Reviewed Radiology: ordered.  Risk Decision regarding hospitalization.   Patient at risk for CVA.  Acute onset of some lightheadedness dizziness does not have any symptoms like that normally.  Patient's head CT does show evidence of remote infarcts.  Needs MRI.  Patient does have a loop recorder.  But I think MRI will be all right.  Patient's blood sugar 108 complete metabolic panel potassium very good at 4.0 GFR is 5 because she is a dialysis patient normally Monday Wednesdays  and Fridays.  Alk phos elevated at 149 but otherwise they are normal.  CBC hemoglobin 9.8 white count 6 point9 and platelets are 189.  Head CT shows no acute intercranial abnormality there is chronic left frontal and chronic left cerebellar infarcts.  CRITICAL CARE Performed by: Brydan Downard Total critical care time: 45 minutes Critical care time was exclusive of separately billable procedures and treating other patients. Critical care was necessary to treat or prevent imminent or life-threatening deterioration. Critical care was time spent personally by me on the following activities: development of treatment plan with patient and/or surrogate as well as nursing, discussions with consultants, evaluation of patient's response to treatment, examination of patient, obtaining history from patient or surrogate, ordering and performing treatments and interventions, ordering and review of laboratory studies, ordering and review of radiographic studies, pulse oximetry and re-evaluation of patient's condition.  MRI pending.  Chest x-ray no acute active disease mild cardiomegaly.  No significant pulmonary edema.  If MRI negative for stroke patient stable for discharge home from a dialysis standpoint.  She is going to have dialysis again on Sunday.  Dizziness is mild not true vertigo.  MRI raises concern for small acute high left frontal cortical infarct.  Will discussed with  neurology and hospitalist admission.  Discussed with Dr. Macel from nephrology he will put her on her list.  Discussed with Dr. Alfornia who will admit from the hospitalist service standpoint.  Briefly discussed with [facility] neurologist on-call.  He was planning to give me a callback.  Discussed with [facility] neurologist.  They will do the stroke workup.    Final diagnoses:  Dizziness  End stage renal disease on dialysis [facility])  Cerebrovascular accident (CVA), unspecified mechanism [facility])    ED Discharge Orders     None          Geraldene Hamilton, MD 11/14/24 2130    Geraldene Hamilton, MD 11/14/24 2256    Geraldene Hamilton, MD 11/14/24 [id number]    Geraldene Hamilton, MD 11/14/24 2345    Geraldene Hamilton, MD 11/14/24 2352    Geraldene Hamilton, MD 11/15/24 0013    Geraldene Hamilton, MD 11/15/24 [id number]

## 2024-11-15 ENCOUNTER — Other Ambulatory Visit: Payer: Self-pay

## 2024-11-15 ENCOUNTER — Observation Stay (HOSPITAL_COMMUNITY)

## 2024-11-15 DIAGNOSIS — I120 Hypertensive chronic kidney disease with stage 5 chronic kidney disease or end stage renal disease: Secondary | ICD-10-CM

## 2024-11-15 DIAGNOSIS — Z992 Dependence on renal dialysis: Secondary | ICD-10-CM

## 2024-11-15 DIAGNOSIS — R42 Dizziness and giddiness: Secondary | ICD-10-CM

## 2024-11-15 DIAGNOSIS — I361 Nonrheumatic tricuspid (valve) insufficiency: Secondary | ICD-10-CM | POA: Diagnosis not present

## 2024-11-15 DIAGNOSIS — R2689 Other abnormalities of gait and mobility: Secondary | ICD-10-CM | POA: Diagnosis not present

## 2024-11-15 DIAGNOSIS — R29700 NIHSS score 0: Secondary | ICD-10-CM | POA: Diagnosis not present

## 2024-11-15 DIAGNOSIS — I340 Nonrheumatic mitral (valve) insufficiency: Secondary | ICD-10-CM

## 2024-11-15 DIAGNOSIS — I6340 Cerebral infarction due to embolism of unspecified cerebral artery: Secondary | ICD-10-CM

## 2024-11-15 DIAGNOSIS — F17210 Nicotine dependence, cigarettes, uncomplicated: Secondary | ICD-10-CM

## 2024-11-15 DIAGNOSIS — E1122 Type 2 diabetes mellitus with diabetic chronic kidney disease: Secondary | ICD-10-CM

## 2024-11-15 DIAGNOSIS — I639 Cerebral infarction, unspecified: Secondary | ICD-10-CM | POA: Diagnosis not present

## 2024-11-15 DIAGNOSIS — F17200 Nicotine dependence, unspecified, uncomplicated: Secondary | ICD-10-CM | POA: Insufficient documentation

## 2024-11-15 DIAGNOSIS — N186 End stage renal disease: Secondary | ICD-10-CM

## 2024-11-15 LAB — ECHOCARDIOGRAM COMPLETE
AR max vel: 1.54 cm2
AV Peak grad: 16.5 mmHg
Ao pk vel: 2.03 m/s
Area-P 1/2: 4.8 cm2
Height: 62 in
S' Lateral: 2.8 cm
Weight: 1984 [oz_av]

## 2024-11-15 LAB — HEMOGLOBIN A1C
Hgb A1c MFr Bld: 6 % — ABNORMAL HIGH (ref 4.8–5.6)
Mean Plasma Glucose: 125.5 mg/dL

## 2024-11-15 LAB — LIPID PANEL
Cholesterol: 146 mg/dL (ref 0–200)
HDL: 35 mg/dL — ABNORMAL LOW (ref >40)
LDL Cholesterol: 81 mg/dL (ref 0–99)
Total CHOL/HDL Ratio: 4.2 ratio
Triglycerides: 148 mg/dL (ref <150)
VLDL: 30 mg/dL (ref 0–40)

## 2024-11-15 LAB — CBC WITH DIFFERENTIAL/PLATELET
Abs Immature Granulocytes: 0.03 K/uL (ref 0.00–0.07)
Basophils Absolute: 0 K/uL (ref 0.0–0.1)
Basophils Relative: 1 %
Eosinophils Absolute: 0.2 K/uL (ref 0.0–0.5)
Eosinophils Relative: 3 %
HCT: 26.7 % — ABNORMAL LOW (ref 36.0–46.0)
Hemoglobin: 8.8 g/dL — ABNORMAL LOW (ref 12.0–15.0)
Immature Granulocytes: 1 %
Lymphocytes Relative: 16 %
Lymphs Abs: 0.9 K/uL (ref 0.7–4.0)
MCH: 29.5 pg (ref 26.0–34.0)
MCHC: 33 g/dL (ref 30.0–36.0)
MCV: 89.6 fL (ref 80.0–100.0)
Monocytes Absolute: 0.5 K/uL (ref 0.1–1.0)
Monocytes Relative: 9 %
Neutro Abs: 4.1 K/uL (ref 1.7–7.7)
Neutrophils Relative %: 70 %
Platelets: 161 K/uL (ref 150–400)
RBC: 2.98 MIL/uL — ABNORMAL LOW (ref 3.87–5.11)
RDW: 17.4 % — ABNORMAL HIGH (ref 11.5–15.5)
WBC: 5.8 K/uL (ref 4.0–10.5)
nRBC: 0 % (ref 0.0–0.2)

## 2024-11-15 LAB — CBG MONITORING, ED
Glucose-Capillary: 95 mg/dL (ref 70–99)
Glucose-Capillary: 148 mg/dL — ABNORMAL HIGH (ref 70–99)

## 2024-11-15 LAB — RENAL FUNCTION PANEL
Albumin: 3 g/dL — ABNORMAL LOW (ref 3.5–5.0)
Anion gap: 13 (ref 5–15)
BUN: 11 mg/dL (ref 8–23)
CO2: 28 mmol/L (ref 22–32)
Calcium: 8.4 mg/dL — ABNORMAL LOW (ref 8.9–10.3)
Chloride: 94 mmol/L — ABNORMAL LOW (ref 98–111)
Creatinine, Ser: 3.13 mg/dL — ABNORMAL HIGH (ref 0.44–1.00)
GFR, Estimated: 14 mL/min — ABNORMAL LOW (ref >60)
Glucose, Bld: 105 mg/dL — ABNORMAL HIGH (ref 70–99)
Phosphorus: 2.2 mg/dL — ABNORMAL LOW (ref 2.5–4.6)
Potassium: 3.7 mmol/L (ref 3.5–5.1)
Sodium: 135 mmol/L (ref 135–145)

## 2024-11-15 LAB — MAGNESIUM: Magnesium: 2.2 mg/dL (ref 1.7–2.4)

## 2024-11-15 LAB — HEPATITIS B SURFACE ANTIGEN: Hepatitis B Surface Ag: NONREACTIVE

## 2024-11-15 LAB — PHOSPHORUS: Phosphorus: 3.7 mg/dL (ref 2.5–4.6)

## 2024-11-15 MED ORDER — NICOTINE 14 MG/24HR TD PT24
14.0000 mg | MEDICATED_PATCH | Freq: Every day | TRANSDERMAL | Status: DC
  Administered 2024-11-16: 14 mg via TRANSDERMAL
  Filled 2024-11-15 (×2): qty 1

## 2024-11-15 MED ORDER — ROPINIROLE HCL 1 MG PO TABS
3.0000 mg | ORAL_TABLET | Freq: Every day | ORAL | Status: DC
  Administered 2024-11-15: 3 mg via ORAL
  Filled 2024-11-15: qty 3

## 2024-11-15 MED ORDER — STROKE: EARLY STAGES OF RECOVERY BOOK
Freq: Once | Status: AC
Start: 2024-11-16 — End: 2024-11-16
  Administered 2024-11-16: 1
  Filled 2024-11-15: qty 1

## 2024-11-15 MED ORDER — HEPARIN SODIUM (PORCINE) 5000 UNIT/ML IJ SOLN
5000.0000 [IU] | Freq: Three times a day (TID) | INTRAMUSCULAR | Status: DC
  Administered 2024-11-15 – 2024-11-16 (×3): 5000 [IU] via SUBCUTANEOUS
  Filled 2024-11-15 (×3): qty 1

## 2024-11-15 MED ORDER — ONDANSETRON HCL 4 MG/2ML IJ SOLN
4.0000 mg | Freq: Four times a day (QID) | INTRAMUSCULAR | Status: DC | PRN
  Administered 2024-11-15: 4 mg via INTRAVENOUS
  Filled 2024-11-15: qty 2

## 2024-11-15 MED ORDER — PANTOPRAZOLE SODIUM 40 MG PO TBEC
40.0000 mg | DELAYED_RELEASE_TABLET | Freq: Every day | ORAL | Status: DC
  Administered 2024-11-15 – 2024-11-16 (×2): 40 mg via ORAL
  Filled 2024-11-15 (×2): qty 1

## 2024-11-15 MED ORDER — TRAZODONE HCL 100 MG PO TABS: 100.0000 mg | ORAL_TABLET | Freq: Every day | ORAL | Status: DC

## 2024-11-15 MED ORDER — SENNOSIDES-DOCUSATE SODIUM 8.6-50 MG PO TABS: 1.0000 | ORAL_TABLET | Freq: Every evening | ORAL | Status: DC | PRN

## 2024-11-15 MED ORDER — HEPARIN SODIUM (PORCINE) 1000 UNIT/ML IJ SOLN
INTRAMUSCULAR | Status: AC
  Filled 2024-11-15: qty 5

## 2024-11-15 MED ORDER — INSULIN ASPART 100 UNIT/ML IJ SOLN: 0.0000 [IU] | Freq: Every day | INTRAMUSCULAR | Status: DC

## 2024-11-15 MED ORDER — ROPINIROLE HCL 1 MG PO TABS
3.0000 mg | ORAL_TABLET | Freq: Once | ORAL | Status: AC
  Administered 2024-11-15: 3 mg via ORAL
  Filled 2024-11-15: qty 3

## 2024-11-15 MED ORDER — CALCITRIOL 0.5 MCG PO CAPS
0.7500 ug | ORAL_CAPSULE | ORAL | Status: DC
  Administered 2024-11-15: 0.75 ug via ORAL
  Filled 2024-11-15: qty 1

## 2024-11-15 MED ORDER — MELATONIN 5 MG PO TABS
5.0000 mg | ORAL_TABLET | Freq: Every evening | ORAL | Status: DC | PRN
Start: 2024-11-15 — End: 2024-11-15

## 2024-11-15 MED ORDER — TRAZODONE HCL 50 MG PO TABS
100.0000 mg | ORAL_TABLET | Freq: Once | ORAL | Status: AC
  Administered 2024-11-15: 100 mg via ORAL
  Filled 2024-11-15: qty 2

## 2024-11-15 MED ORDER — SODIUM CHLORIDE 0.9 % IV SOLN: INTRAVENOUS | Status: AC

## 2024-11-15 MED ORDER — TRAZODONE HCL 100 MG PO TABS
100.0000 mg | ORAL_TABLET | Freq: Every day | ORAL | Status: DC
  Administered 2024-11-15: 100 mg via ORAL
  Filled 2024-11-15: qty 1

## 2024-11-15 MED ORDER — NICOTINE POLACRILEX 2 MG MT GUM
2.0000 mg | CHEWING_GUM | OROMUCOSAL | Status: DC | PRN
Start: 2024-11-15 — End: 2024-11-16

## 2024-11-15 MED ORDER — ROPINIROLE HCL 1 MG PO TABS: 3.0000 mg | ORAL_TABLET | Freq: Every day | ORAL | Status: DC

## 2024-11-15 MED ORDER — INSULIN ASPART 100 UNIT/ML IJ SOLN: 0.0000 [IU] | Freq: Three times a day (TID) | INTRAMUSCULAR | Status: DC

## 2024-11-15 MED ORDER — INSULIN ASPART 100 UNIT/ML IJ SOLN: 0.0000 [IU] | INTRAMUSCULAR | Status: DC

## 2024-11-15 MED ORDER — ATORVASTATIN CALCIUM 40 MG PO TABS
40.0000 mg | ORAL_TABLET | Freq: Every day | ORAL | Status: DC
  Administered 2024-11-16: 40 mg via ORAL
  Filled 2024-11-15: qty 1

## 2024-11-15 MED ORDER — ACETAMINOPHEN 325 MG PO TABS: 650.0000 mg | ORAL_TABLET | ORAL | Status: DC | PRN

## 2024-11-15 MED ORDER — IOHEXOL 350 MG/ML SOLN
75.0000 mL | Freq: Once | INTRAVENOUS | Status: AC | PRN
  Administered 2024-11-15: 75 mL via INTRAVENOUS

## 2024-11-15 MED ORDER — METHIMAZOLE 10 MG PO TABS
20.0000 mg | ORAL_TABLET | ORAL | Status: DC
  Administered 2024-11-15: 20 mg via ORAL
  Filled 2024-11-15: qty 4

## 2024-11-15 MED ORDER — CINACALCET HCL 30 MG PO TABS
30.0000 mg | ORAL_TABLET | ORAL | Status: DC
  Administered 2024-11-15: 30 mg via ORAL
  Filled 2024-11-15: qty 1

## 2024-11-15 MED ORDER — METHIMAZOLE 10 MG PO TABS
30.0000 mg | ORAL_TABLET | ORAL | Status: DC
  Administered 2024-11-16: 30 mg via ORAL
  Filled 2024-11-15: qty 3

## 2024-11-15 MED ORDER — CHLORHEXIDINE GLUCONATE CLOTH 2 % EX PADS
6.0000 | MEDICATED_PAD | Freq: Every day | CUTANEOUS | Status: DC
  Administered 2024-11-16: 6 via TOPICAL

## 2024-11-15 MED ORDER — ACETAMINOPHEN 160 MG/5ML PO SOLN: 650.0000 mg | ORAL | Status: DC | PRN

## 2024-11-15 MED ORDER — AMLODIPINE BESYLATE 5 MG PO TABS: 2.5000 mg | ORAL_TABLET | Freq: Every day | ORAL | Status: DC

## 2024-11-15 MED ORDER — ASPIRIN 81 MG PO TBEC
81.0000 mg | DELAYED_RELEASE_TABLET | Freq: Every day | ORAL | Status: DC
  Administered 2024-11-15 – 2024-11-16 (×2): 81 mg via ORAL
  Filled 2024-11-15 (×2): qty 1

## 2024-11-15 MED ORDER — CLOPIDOGREL BISULFATE 75 MG PO TABS
75.0000 mg | ORAL_TABLET | Freq: Every day | ORAL | Status: DC
  Administered 2024-11-15 – 2024-11-16 (×2): 75 mg via ORAL
  Filled 2024-11-15 (×2): qty 1

## 2024-11-15 MED ORDER — ATORVASTATIN CALCIUM 40 MG PO TABS
40.0000 mg | ORAL_TABLET | Freq: Every day | ORAL | Status: DC
  Administered 2024-11-15: 40 mg via ORAL
  Filled 2024-11-15: qty 1

## 2024-11-15 MED ORDER — SEVELAMER CARBONATE 800 MG PO TABS
800.0000 mg | ORAL_TABLET | Freq: Three times a day (TID) | ORAL | Status: DC
  Administered 2024-11-15 – 2024-11-16 (×2): 800 mg via ORAL
  Filled 2024-11-15 (×2): qty 1

## 2024-11-15 MED ORDER — ATORVASTATIN CALCIUM 40 MG PO TABS: 80.0000 mg | ORAL_TABLET | Freq: Every day | ORAL | Status: DC

## 2024-11-15 MED ORDER — ACETAMINOPHEN 650 MG RE SUPP: 650.0000 mg | RECTAL | Status: DC | PRN

## 2024-11-15 NOTE — H&P (Addendum)
 History and Physical    Monica Hunt FMW:[id number] DOB: 06-Dec-1941 DOA: 11/14/2024  DOS: the patient was seen and examined on 11/14/2024  PCP: Cicero Aureliano SAUNDERS, MD   Patient coming from: Home  I have personally briefly reviewed patient's old medical records in [facility] and CareEverywhere  HPI:   Monica Hunt is a 83 y.o. year old female with past medical history of type 2 diabetes mellitus, ESRD (MWF), hypertension, anxiety, anemia of chronic disease, GERD, hyperlipidemia, restless leg syndrome, hyperthyroidism, and prior CVA in 2023.  She presents to [facility] ED with sensation of room spinning as well as imbalance that began Thursday evening.  She reports on waking up Friday the symptoms continued and she later presented to the ED for evaluation.  She denies headaches, visual changes, dysarthria, weakness, or numbness.   ED Course: On arrival to [facility], ED patient was noted to be febrile temp 36.6 C, BP 150/70, HR 74, RR 16, SpO2 97% on room air.  CT head obtained there without any acute intracranial abnormality.  MRI subsequently ordered and showed small acute high left frontal cortical infarct and multiple remote infarcts and many chronic microhemorrhages.  CXR obtained showed no active disease.  Labs notable for BUN 39 and creatinine 7.1 consistent with ESRD, alkaline phosphatase 149, hemoglobin 9.8.  Neurology consulted by EDP and recommended CTA head and neck, echo, permissive hypertension, aspirin , and Plavix  however none of these recommendations ordered overnight. TR[facility] contacted for admission.  Review of Systems: As mentioned in the history of present illness. All other systems reviewed and are negative.   Review of Systems  Constitutional:  Negative for chills and fever.  HENT:  Positive for congestion.   Respiratory:  Negative for cough and shortness of breath.   Gastrointestinal:  Negative for abdominal pain, heartburn, nausea and vomiting.  Genitourinary:   Negative for dysuria.  Neurological:  Positive for dizziness. Negative for focal weakness, weakness and headaches.  All other systems reviewed and are negative.   Past Medical History:  Diagnosis Date   Anemia    Cataracts, bilateral    Chronic kidney disease    ESRD  Dialysis M/W/F   Depression    Diabetes mellitus without complication (HCC)    GERD (gastroesophageal reflux disease)    Hypertension    Hyperthyroidism    Pneumonia    Stroke (HCC)    symptom onset ~ 01/10/22, diagnosed 01/24/22 acute/subacute bilateral cerebral infarcts, largest left frontal lobe, extensive punctate chronic microhemorrages concerning for cerbral amyloid angiopathy    Past Surgical History:  Procedure Laterality Date   ABDOMINAL HYSTERECTOMY     vaginal   AV FISTULA PLACEMENT Right 05/02/2022   Procedure: RIGHT ARTERIOVENOUS (AV) FISTULA  CREATION;  Surgeon: Sheree Penne Bruckner, MD;  Location: [facility] OR;  Service: Vascular;  Laterality: Right;   CARPAL TUNNEL RELEASE Right    COLONOSCOPY WITH PROPOFOL  N/A 11/17/2014   Procedure: COLONOSCOPY WITH PROPOFOL ;  Surgeon: Gladis MARLA Louder, MD;  Location: [facility] ENDOSCOPY;  Service: Endoscopy;  Laterality: N/A;   LOOP RECORDER INSERTION N/A 01/25/2022   Procedure: LOOP RECORDER INSERTION;  Surgeon: Inocencio Soyla Gladis, MD;  Location: [facility] INVASIVE CV LAB;  Service: Cardiovascular;  Laterality: N/A;     reports that she has been smoking cigarettes. She has a 11.3 pack-year smoking history. She has never used smokeless tobacco. She reports that she does not drink alcohol and does not use drugs.  Allergies  Allergen Reactions   Bee Venom Anaphylaxis  Wasp Venom Anaphylaxis   Doxycycline Rash    Family History  Problem Relation Age of Onset   Kidney cancer Mother    Lung cancer Father     Prior to Admission medications   Medication Sig Start Date End Date Taking? Authorizing Provider  acetaminophen  (TYLENOL ) 500 MG tablet Take 1,000 mg by mouth every 6  (six) hours as needed for mild pain.   Yes [provider]  albuterol (VENTOLIN HFA) 108 (90 Base) MCG/ACT inhaler Inhale 1-2 puffs into the lungs every 6 (six) hours as needed. 06/03/23  Yes [provider]  amLODipine  (NORVASC ) 2.5 MG tablet Take 1 tablet (2.5 mg total) by mouth daily. 06/25/23  Yes Croitoru, Mihai, MD  aspirin  EC 81 MG tablet Take 1 tablet (81 mg total) by mouth daily. Swallow whole. 01/26/22  Yes Bryn Bernardino NOVAK, MD  atorvastatin  (LIPITOR) 40 MG tablet Take 40 mg by mouth daily.   Yes [provider]  methimazole  (TAPAZOLE ) 10 MG tablet 2 tablets Monday-Saturday and 3 tablets Sunday Orally daily; Duration: 90 days   Yes [provider]  midodrine (PROAMATINE) 5 MG tablet Take 5 mg by mouth 3 (three) times daily as needed (if BP is low). 01/10/22  Yes [provider]  Multiple Minerals-Vitamins (CAL-MAG-ZINC-D) TABS Take 1 tablet by mouth daily.   Yes [provider]  Multiple Vitamin (MULTIVITAMIN WITH MINERALS) TABS tablet Take 1 tablet by mouth daily.   Yes [provider]  omeprazole (PRILOSEC) 40 MG capsule Take 40 mg by mouth daily.   Yes [provider]  risedronate (ACTONEL) 35 MG tablet Take 35 mg by mouth every 14 (fourteen) days. Every other Thursday   Yes [provider]  rOPINIRole  (REQUIP ) 3 MG tablet Take 3 mg by mouth at bedtime. Take 1 tab 1 to 3 hours before bedtime   Yes [provider]  traZODone  (DESYREL ) 50 MG tablet Take 100 mg by mouth at bedtime.   Yes [provider]  ACCU-CHEK SMARTVIEW test strip  01/12/20   [provider]    Physical Exam: Vitals:   11/15/24 0748 11/15/24 0749 11/15/24 0845 11/15/24 0900  BP: (!) 144/73  122/89 (!) 140/64  Pulse: 83 74 72 69  Resp: 17  19   Temp: 98.2 F (36.8 C)     TempSrc: Oral     SpO2: 98% 97% 100% 98%  Weight:      Height:        Physical Exam Vitals and nursing note reviewed.  HENT:     Head:  Normocephalic.  Eyes:     Pupils: Pupils are equal, round, and reactive to light.  Cardiovascular:     Rate and Rhythm: Normal rate and regular rhythm.     Pulses: Normal pulses.  Pulmonary:     Effort: Pulmonary effort is normal.  Abdominal:     General: There is no distension.     Palpations: Abdomen is soft.     Tenderness: There is no abdominal tenderness. There is no guarding.  Musculoskeletal:        General: Normal range of motion.  Skin:    General: Skin is warm and dry.     Capillary Refill: Capillary refill takes less than 2 seconds.  Neurological:     General: No focal deficit present.     Mental Status: She is alert.       Labs on Admission: I have personally reviewed following labs and imaging studies  CBC:  Recent Labs  Lab 11/14/24 1652  WBC 6.9  HGB 9.8*  HCT 29.5*  MCV 89.9  PLT 189   Basic Metabolic Panel: Recent Labs  Lab 11/14/24 1652  NA 139  K 4.0  CL 99  CO2 24  GLUCOSE 105*  BUN 39*  CREATININE 7.11*  CALCIUM  9.1   GFR: Estimated Creatinine Clearance: 4.8 mL/min (A) (by C-G formula based on SCr of 7.11 mg/dL (H)). Liver Function Tests: Recent Labs  Lab 11/14/24 1652  AST 17  ALT 15  ALKPHOS 149*  BILITOT 0.7  PROT 7.3  ALBUMIN 3.6   No results for input(s): LIPASE, AMYLASE in the last 168 hours. No results for input(s): AMMONIA in the last 168 hours. Coagulation Profile: No results for input(s): INR, PROTIME in the last 168 hours. Cardiac Enzymes: No results for input(s): CKTOTAL, CKMB, CKMBINDEX, TROPONINI, TROPONINIHS in the last 168 hours. BNP (last 3 results) No results for input(s): BNP in the last 8760 hours. HbA1C: Recent Labs    11/15/24 0843  HGBA1C 6.0*   CBG: Recent Labs  Lab 11/14/24 1656 11/15/24 1022  GLUCAP 108* 95   Lipid Profile: Recent Labs    11/15/24 0843  CHOL 146  HDL 35*  LDLCALC 81  TRIG 851  CHOLHDL 4.2   Thyroid  Function Tests: No results for input(s):  TSH, T4TOTAL, FREET4, T3FREE, THYROIDAB in the last 72 hours. Anemia Panel: No results for input(s): VITAMINB12, FOLATE, FERRITIN, TIBC, IRON, RETICCTPCT in the last 72 hours. Urine analysis: No results found for: COLORURINE, APPEARANCEUR, LABSPEC, PHURINE, GLUCOSEU, HGBUR, BILIRUBINUR, KETONESUR, PROTEINUR, UROBILINOGEN, NITRITE, LEUKOCYTESUR  Radiological Exams on Admission: I have personally reviewed images CT ANGIO HEAD NECK W WO CM Addendum Date: 11/15/2024  ADDENDUM #1  ADDENDUM: Study discussed by telephone with Xu at 1008 AM on 11/15/2024. ---------------------------------------------------- Electronically signed by: Helayne Hurst MD 11/15/2024 10:25 AM EST RP Workstation: [id number]   Result Date: 11/15/2024  ORIGINAL REPORT EXAM: CT HEAD WITHOUT CTA HEAD AND NECK WITH AND WITHOUT 11/15/2024 09:29:28 AM TECHNIQUE: CTA of the head and neck was performed with and without the administration of intravenous contrast. Noncontrast CT of the head with reconstructed 2-D images are also provided for review. Multiplanar 2D and/or 3D reformatted images are provided for review. Automated exposure control, iterative reconstruction, and/or weight based adjustment of the mA/kV was utilized to reduce the radiation dose to as low as reasonably achievable. 75mL iohexol  (OMNIPAQUE ) 350 MG/ML injection was administered. COMPARISON: Head CT and MRI 11/14/2024. CLINICAL HISTORY: 83 year old female. Stroke/TIA, determine embolic source. Small acute on chronic cortical infarct in the superior periventricular area on MRI 11/14/2024. FINDINGS: CT HEAD: BRAIN AND VENTRICLES: Confluent hypodensity of gray and white matter at the superior perirolandic area appears stable. Additional confluent bilateral cerebral white matter hypodensity is stable. Chronic left cerebellar infarct. Stable CT appearance of the brain since yesterday. ORBITS: No acute abnormality. SINUSES AND MASTOIDS:  Middle ears and mastoids, paranasal sinuses remain well aerated. BONES: Calcified atherosclerosis at the skull base. CTA NECK: AORTIC ARCH AND ARCH VESSELS: Aortic arch is not completely visible. Arch atherosclerosis. Visible brachiocephalic artery and right CCA origin atherosclerosis without stenosis. CERVICAL CAROTID ARTERIES: Right CCA: Mildly tortuous. Moderate soft and calcified plaque at the bifurcation, less than 50% stenosis. Mild additional atherosclerosis of the distal cervical right ICA without stenosis. Left CCA: Soft and calcified plaque without visible stenosis. Moderate soft plaque or possibly adherent thrombus in the left ICA origin on series 15 image 93. Adjacent calcified plaque.  Less than 50% stenosis results. Tortuous cervical left ICA below the skull base. No dissection, arterial injury, or hemodynamically significant stenosis by NASCET criteria. CERVICAL VERTEBRAL ARTERIES: Right Vertebral Artery: Normal origin. Tortuous V1 segment is mildly irregular but without significant stenosis. Nondominant. Occluded in the V3 segment with gradual loss of enhancement. Reconstituted enhancement of the distal V4 segment and the right PICA origin remains patent on series 17 image 130. Left Vertebral Artery: Somewhat dominant. Proximal left subclavian artery atherosclerosis without visible stenosis. Mild atherosclerosis at the origin without stenosis. Tortuous to the vertebrobasilar junction without stenosis. Normal left PICA origin. No dissection or arterial injury. LUNGS AND MEDIASTINUM: Unremarkable. SOFT TISSUES: Mild for age degeneration in the spine. Right internal jugular vein approach dual lumen dialysis type catheter. Thyroid  goiter. Otherwise negative nonvascular neck soft tissue spaces. BONES: No acute abnormality. CTA HEAD: ANTERIOR CIRCULATION: Internal Carotid Arteries: Right ICA: Mild to moderate distal right ICA siphon calcified atherosclerosis. Mild supraclinoid right ICA stenosis. Left ICA:  Patent left ICA siphon with mild to moderate calcified plaque but no stenosis. Small infundibulum of the distal left ICA, normal variant. Anterior Cerebral Arteries: Origins are normal. Branches are within normal limits. Middle Cerebral Arteries: Origins are normal. No MCA branch occlusion or proximal stenosis is identified. Normal anterior communicating artery. No aneurysm. POSTERIOR CIRCULATION: Posterior Cerebral Arteries: Origins are patent. Branches are patent with mild irregularity, no significant stenosis. Basilar Artery: Patent and mildly tortuous. Patent SCA and PCA origins. Vertebral Arteries: Right Vertebral Artery: Occluded in the V3 segment with retrograde reconstitution of the distal V4 and right PICA. Left Vertebral Artery: Proximal left subclavian artery atherosclerosis without visible stenosis. Mild atherosclerosis at the origin without stenosis. Tortuous to the vertebrobasilar junction without stenosis. Normal left PICA origin. Diminutive or absent posterior communicating arteries. No aneurysm. OTHER: Major dural venous sinuses are enhancing and appear to be patent. IMPRESSION: 1. Occluded right vertebral artery in the v3 segment with retrograde reconstitution of the distal right v4 and PICA. 2. No other large vessel occlusion. Widespread bilateral carotid atherosclerosis, including soft plaque vs small adherent thrombus at the Left ICA origin. But no other hemodynamically significant stenosis. 3. Stable CT appearance of the brain since yesterday. Electronically signed by: Helayne Hurst MD 11/15/2024 09:53 AM EST RP Workstation: [id number]   MR Brain Wo Contrast (neuro protocol) Result Date: 11/14/2024 EXAM: MRI Brain Without Contrast 11/14/2024 11:13:52 PM TECHNIQUE: Multiplanar multisequence MRI of the head/brain was performed without the administration of intravenous contrast. COMPARISON: MRI head Jan 24, 2022 CLINICAL HISTORY: Neuro deficit, acute, stroke suspected FINDINGS: BRAIN AND  VENTRICLES: Small acute high left frontal cortical infarct. Remote left frontal infarct adjacent to the above acute infarct. Additional remote infarccts in the right frontal lobe and right occipital lobe. Remote corona radiata lacunar infarcts. Remote left cerebellar infarct. Advanced T2 hyperintensities in the white matter, compatible with chronic microvascular ischemic change. No acute intracranial hemorrhage. Many punctate foci of susceptibility artifact that are compatible with chronic microhemorrhages that are predominantly peripheral. No mass. No midline shift. No hydrocephalus. Normal flow voids. ORBITS: No acute abnormality. SINUSES AND MASTOIDS: No acute abnormality. BONES AND SOFT TISSUES: Normal marrow signal. IMPRESSION: 1. Small acute high left frontal cortical infarct. 2. Multiple remote infarcts and advanced chronic microvascular ischemic change. 3. Many chronic microhemorrhages throughout the cerebral and cerebellar hemispheres predominantly peripheral and concerning for cerebral amyloid angiopathy. Electronically signed by: Gilmore Molt MD 11/14/2024 11:43 PM EST RP Workstation: [id number]   DG Chest Port 1 View Result Date:  11/14/2024 CLINICAL DATA:  Dialysis patient EXAM: PORTABLE CHEST 1 VIEW COMPARISON:  Chest x-ray 03/17/2023 FINDINGS: Aloop recorder device overlies the left chest. Right-sided central venous catheter tip projects over the caval atrial junction. The heart is mildly enlarged. The lungs are clear. There is no pleural effusion or pneumothorax. There is a healed left sixth rib fracture. No acute fractures are seen. IMPRESSION: 1. No active disease. 2. Mild cardiomegaly. Electronically Signed   By: Greig Pique M.D.   On: 11/14/2024 22:11   CT HEAD WO CONTRAST Result Date: 11/14/2024 EXAM: CT HEAD WITHOUT CONTRAST 11/14/2024 05:16:58 PM TECHNIQUE: CT of the head was performed without the administration of intravenous contrast. Automated exposure control, iterative  reconstruction, and/or weight based adjustment of the mA/kV was utilized to reduce the radiation dose to as low as reasonably achievable. COMPARISON: None available. CLINICAL HISTORY: dizziness FINDINGS: BRAIN AND VENTRICLES: No acute hemorrhage. No evidence of acute infarct. Moderate cerebral and cerebellar volume loss and moderate periventricular white matter disease. Chronic left frontal infarct. Chronic left cerebellar infarct. No hydrocephalus. No extra-axial collection. No mass effect or midline shift. ORBITS: Status post bilateral lens replacement. No acute abnormality. SINUSES: No acute abnormality. SOFT TISSUES AND SKULL: No acute soft tissue abnormality. No skull fracture. VASCULATURE: Moderate calcific atheromatous disease within carotid siphons. IMPRESSION: 1. No acute intracranial abnormality. 2. Moderate volume loss. Moderate chronic microvascular ischemic changes. 3. Chronic left frontal and left cerebellar infarcts. Electronically signed by: Donnice Mania MD 11/14/2024 05:33 PM EST RP Workstation: [id number]     Assessment/Plan Principal Problem:   Acute cerebrovascular accident (CVA) (HCC)   ##Acute cerebrovascular Accident - Permissive HTN x 48 hrs from sx onset  - ECHO - CTA head and neck - ASA and Plavix  per neuro - A1C and LDL - LDL 81, increase home atorvastatin  to 80 mg daily - q4H neuro checks - STAT head CT for any change in neuro exam - Monitor on tele - PT/OT/SLP evaluation - Stroke education  #Hyperlipidemia -Increase home atorvastatin  to 80 mg daily  #Hypertension - Allow for permissive hypertension  #ESRD on MWF dialysis #Anemia of chronic disease -Nephrology consulted for continuation of dialysis inpatient  #Type 2 diabetes mellitus - Not on insulin  outpatient - AC/at bedtime SSI and CBG monitoring while inpatient  #Hyperthyroidism - Continue home methimazole   #GERD - Protonix   #Restless leg syndrome - Continue home Requip   #Tobacco use  disorder - Patient counseled on cessation - Nicotine  replacement therapy while inpatient with nicotine  patch and gum    VTE prophylaxis:  SQ Heparin   GI prophylaxis: Protonix  Diet: Renal with 1200 mL fluid restriction Access: PIV Lines: None Code Status:  Full Code Telemetry: Yes  Admission status: Observation, Telemetry bed Patient is from: Home Anticipated d/c is to: Home Anticipated d/c date is: 1 day Patient currently: Undergoing stroke workup   Family Communication: Daughter Bascom called at  [phone], no answer, HIPAA compliant voicemail left   Consults called: Neurology consulted by EDP    Severity of Illness: The appropriate patient status for this patient is OBSERVATION. Observation status is judged to be reasonable and necessary in order to provide the required intensity of service to ensure the patient's safety. The patient's presenting symptoms, physical exam findings, and initial radiographic and laboratory data in the context of their medical condition is felt to place them at decreased risk for further clinical deterioration. Furthermore, it is anticipated that the patient will be medically stable for discharge from the hospital within 2 midnights of  admission.   To reach the provider On-Call:   7AM- 7PM see care teams to locate the attending and reach out to them via [email or web address] Password: [email or web address] 7PM-7AM contact night-coverage If you still have difficulty reaching the appropriate provider, please page the [facility] (Director on Call) for [facility] on amion for assistance  This document was prepared using Conservation officer, historic buildings and may include unintentional dictation errors.  Rockie Rams FNP-BC, PMHNP-BC Nurse Practitioner [facility]

## 2024-11-15 NOTE — ED Notes (Signed)
 Neurology at bedside.

## 2024-11-15 NOTE — Consult Note (Addendum)
 ESRD Consult Note  Requesting provider: [facility] requesting consult: Hospitalist Reason for consult: ESRD, provision of dialysis Indication for acute dialysis?: End Stage Renal Disease  Outpatient dialysis unit: [facility] Outpatient dialysis prescription: MWF, BFR 400, EDW 54 kg, 3 hours, 2K, 2.5 calcium , TDC, Mircera 50 mcg last given 11/17, Sensipar  30 mg each treatment, calcitriol  0.75 mcg each treatment  Assessment/Recommendations: Monica Hunt is a/an 83 y.o. female with a past medical history notable for ESRD on HD admitted with stroke.   # ESRD: Missed dialysis Friday but labs look good.  Volume looks okay.  Transitioning to holiday schedule so we will plan on dialysis today and maintain TTS holiday schedule  # Volume/ hypertension: Permissive hypertension.  Minimal ultrafiltration.  Add back home blood pressure medications as able per neurology  # Anemia of Chronic Kidney Disease: Hemoglobin 9.8.  Hold Mircera in the setting of stroke.  Continue to monitor  # Secondary Hyperparathyroidism/Hyperphosphatemia: Continue home Sensipar  and calcitriol .  Continue sevelamer  800 mg with meals  # Vascular access: TDC with no issues per patient  # Left frontal cortical stroke: With dizziness.  Management per primary team and neurology   # Additional recommendations: - Dose all meds for creatinine clearance < 10 ml/min  - Unless absolutely necessary, no MRIs with gadolinium.  - Implement save arm precautions.  Prefer needle sticks in the dorsum of the hands or wrists.  No blood pressure measurements in arm. - If blood transfusion is requested during hemodialysis sessions, please alert us  prior to the session.  - Use synthetic opioids (Fentanyl /Dilaudid) if needed  Recommendations were discussed with the primary team.   History of Present Illness: Monica Hunt is a/an 83 y.o. female with a past medical history of ESRD who presents with dizziness  Patient presented to the  hospital yesterday.  She last had dialysis on Wednesday.  On Thursday evening she started to have dizziness with mostly a spinning sensation and feeling like she could not get her balance.  She also had some nausea with this dissociated feeling.  Denied fevers, chills, shortness of breath, chest pain, diarrhea.  No problems with dialysis recently.  She does state her blood pressure goes up and down but this is a chronic issue.  She was unable to go to dialysis on Friday because of her symptoms.  She came to the emergency department for further evaluation and MRI demonstrated to stroke.  She was admitted for further management.  She states she feels okay this morning with no new complaints.   Medications:  Current Facility-Administered Medications  Medication Dose Route Frequency Provider Last Rate Last Admin   [START ON 11/16/2024]  stroke: early stages of recovery book   Does not apply Once Foust, [name] L, NP       0.9 %  sodium chloride  infusion   Intravenous Continuous Foust, [name] L, NP       acetaminophen  (TYLENOL ) tablet 650 mg  650 mg Oral Q4H PRN Foust, [name] L, NP       Or   acetaminophen  (TYLENOL ) 160 MG/5ML solution 650 mg  650 mg Per Tube Q4H PRN Foust, [name] L, NP       Or   acetaminophen  (TYLENOL ) suppository 650 mg  650 mg Rectal Q4H PRN Foust, [name] L, NP       aspirin  EC tablet 81 mg  81 mg Oral Daily Foust, [name] L, NP       clopidogrel  (PLAVIX ) tablet 75 mg  75 mg  Oral Daily Foust, [name] L, NP       heparin  injection 5,000 Units  5,000 Units Subcutaneous Q8H Foust, [name] L, NP       melatonin tablet 5 mg  5 mg Oral QHS PRN Foust, [name] L, NP       nicotine  (NICODERM CQ  - dosed in mg/24 hours) patch 14 mg  14 mg Transdermal Daily Foust, [name] L, NP       nicotine  polacrilex (NICORETTE ) gum 2 mg  2 mg Oral PRN Foust, [name] L, NP       pantoprazole  (PROTONIX ) EC tablet 40 mg  40 mg Oral Daily Foust, [name] L, NP       senna-docusate (Senokot-S) tablet 1 tablet  1 tablet Oral QHS PRN Foust,  [name] L, NP       Current Outpatient Medications  Medication Sig Dispense Refill   acetaminophen  (TYLENOL ) 500 MG tablet Take 1,000 mg by mouth every 6 (six) hours as needed for mild pain.     albuterol (VENTOLIN HFA) 108 (90 Base) MCG/ACT inhaler Inhale 1-2 puffs into the lungs every 6 (six) hours as needed.     amLODipine  (NORVASC ) 2.5 MG tablet Take 1 tablet (2.5 mg total) by mouth daily. 90 tablet 2   aspirin  EC 81 MG tablet Take 1 tablet (81 mg total) by mouth daily. Swallow whole. 30 tablet 11   atorvastatin  (LIPITOR) 40 MG tablet Take 40 mg by mouth daily.     methimazole  (TAPAZOLE ) 10 MG tablet 2 tablets Monday-Saturday and 3 tablets Sunday Orally daily; Duration: 90 days     midodrine (PROAMATINE) 5 MG tablet Take 5 mg by mouth 3 (three) times daily as needed (if BP is low).     Multiple Minerals-Vitamins (CAL-MAG-ZINC-D) TABS Take 1 tablet by mouth daily.     Multiple Vitamin (MULTIVITAMIN WITH MINERALS) TABS tablet Take 1 tablet by mouth daily.     omeprazole (PRILOSEC) 40 MG capsule Take 40 mg by mouth daily.     risedronate (ACTONEL) 35 MG tablet Take 35 mg by mouth every 14 (fourteen) days. Every other Thursday     rOPINIRole  (REQUIP ) 3 MG tablet Take 3 mg by mouth at bedtime. Take 1 tab 1 to 3 hours before bedtime     traZODone  (DESYREL ) 50 MG tablet Take 100 mg by mouth at bedtime.     ACCU-CHEK SMARTVIEW test strip        ALLERGIES Bee venom, Wasp venom, and Doxycycline  MEDICAL HISTORY Past Medical History:  Diagnosis Date   Anemia    Cataracts, bilateral    Chronic kidney disease    ESRD  Dialysis M/W/F   Depression    Diabetes mellitus without complication (HCC)    GERD (gastroesophageal reflux disease)    Hypertension    Hyperthyroidism    Pneumonia    Stroke (HCC)    symptom onset ~ 01/10/22, diagnosed 01/24/22 acute/subacute bilateral cerebral infarcts, largest left frontal lobe, extensive punctate chronic microhemorrages concerning for cerbral amyloid  angiopathy     SOCIAL HISTORY Social History   Socioeconomic History   Marital status: Divorced    Spouse name: Not on file   Number of children: Not on file   Years of education: Not on file   Highest education level: Not on file  Occupational History   Not on file  Tobacco Use   Smoking status: Every Day    Current packs/day: 0.25    Average packs/day: 0.3 packs/day for 45.0 years (11.3 ttl  pk-yrs)    Types: Cigarettes   Smokeless tobacco: Never  Vaping Use   Vaping status: Never Used  Substance and Sexual Activity   Alcohol use: No   Drug use: No   Sexual activity: Not on file  Other Topics Concern   Not on file  Social History Narrative   Not on file   Social Drivers of Health   Financial Resource Strain: Not on file  Food Insecurity: Not on file  Transportation Needs: Not on file  Physical Activity: Not on file  Stress: Not on file  Social Connections: Not on file  Intimate Partner Violence: Not on file     FAMILY HISTORY Family History  Problem Relation Age of Onset   Kidney cancer Mother    Lung cancer Father      Review of Systems: 12 systems were reviewed and negative except per HPI  Physical Exam: Vitals:   11/15/24 0748 11/15/24 0749  BP: (!) 144/73   Pulse: 83 74  Resp: 17   Temp: 98.2 F (36.8 C)   SpO2: 98% 97%   No intake/output data recorded. No intake or output data in the 24 hours ending 11/15/24 0752 General: well-appearing, no acute distress HEENT: anicteric sclera, MMM CV: normal rate, no murmurs, no edema Lungs: bilateral chest rise, normal wob Abd: soft, non-tender, non-distended Skin: no visible lesions or rashes Psych: alert, engaged, appropriate mood and affect Neuro: normal speech, EOMI  Test Results Reviewed Lab Results  Component Value Date   NA 139 11/14/2024   K 4.0 11/14/2024   CL 99 11/14/2024   CO2 24 11/14/2024   BUN 39 (H) 11/14/2024   CREATININE 7.11 (H) 11/14/2024   CALCIUM  9.1 11/14/2024    ALBUMIN 3.6 11/14/2024   PHOS 1.7 (L) 01/26/2022    I have reviewed relevant outside healthcare records

## 2024-11-15 NOTE — Progress Notes (Signed)
 STROKE TEAM PROGRESS NOTE   SIGNIFICANT HOSPITAL EVENTS 11/21:  - Presented for evaluation of room spinning sensation - MRI brain revealed a small acute high left frontal cortical infarction  11/22: - Patient endorses symptoms have resolved overnight, no further neurological complaints  INTERIM HISTORY/SUBJECTIVE Patients daughter is at the bedside.  Patient reports that she can cut down on the amount of cigarettes she smokes daily but cannot stop.  She was counseled on importance of smoking cessation.  Patient reports bothersome cough that is nonproductive.  OBJECTIVE CBC    Component Value Date/Time   WBC 6.9 11/14/2024 1652   RBC 3.28 (L) 11/14/2024 1652   HGB 9.8 (L) 11/14/2024 1652   HGB 9.4 (L) 12/13/2021 1558   HCT 29.5 (L) 11/14/2024 1652   HCT 27.1 (L) 12/13/2021 1558   PLT 189 11/14/2024 1652   PLT 286 12/13/2021 1558   MCV 89.9 11/14/2024 1652   MCV 87 12/13/2021 1558   MCH 29.9 11/14/2024 1652   MCHC 33.2 11/14/2024 1652   RDW 17.2 (H) 11/14/2024 1652   RDW 14.3 12/13/2021 1558   LYMPHSABS 1.0 01/24/2022 1405   MONOABS 0.6 01/24/2022 1405   EOSABS 0.2 01/24/2022 1405   BASOSABS 0.0 01/24/2022 1405   BMET    Component Value Date/Time   NA 139 11/14/2024 1652   NA 145 (H) 12/13/2021 1558   K 4.0 11/14/2024 1652   CL 99 11/14/2024 1652   CO2 24 11/14/2024 1652   GLUCOSE 105 (H) 11/14/2024 1652   BUN 39 (H) 11/14/2024 1652   BUN 51 (H) 12/13/2021 1558   CREATININE 7.11 (H) 11/14/2024 1652   CALCIUM  9.1 11/14/2024 1652   EGFR 3 (L) 12/13/2021 1558   GFRNONAA 5 (L) 11/14/2024 1652   Lab Results  Component Value Date   HGBA1C 4.9 01/25/2022   Lab Results  Component Value Date   CHOL 146 05/29/2023   HDL 39 (L) 05/29/2023   LDLCALC 79 05/29/2023   TRIG 161 (H) 05/29/2023   CHOLHDL 3.7 05/29/2023  Drugs of Abuse  No results found for: LABOPIA, COCAINSCRNUR, LABBENZ, AMPHETMU, THCU, LABBARB   IMAGING past 24 hours MR Brain Wo Contrast  (neuro protocol) Result Date: 11/14/2024 EXAM: MRI Brain Without Contrast 11/14/2024 11:13:52 PM TECHNIQUE: Multiplanar multisequence MRI of the head/brain was performed without the administration of intravenous contrast. COMPARISON: MRI head Jan 24, 2022 CLINICAL HISTORY: Neuro deficit, acute, stroke suspected FINDINGS: BRAIN AND VENTRICLES: Small acute high left frontal cortical infarct. Remote left frontal infarct adjacent to the above acute infarct. Additional remote infarccts in the right frontal lobe and right occipital lobe. Remote corona radiata lacunar infarcts. Remote left cerebellar infarct. Advanced T2 hyperintensities in the white matter, compatible with chronic microvascular ischemic change. No acute intracranial hemorrhage. Many punctate foci of susceptibility artifact that are compatible with chronic microhemorrhages that are predominantly peripheral. No mass. No midline shift. No hydrocephalus. Normal flow voids. ORBITS: No acute abnormality. SINUSES AND MASTOIDS: No acute abnormality. BONES AND SOFT TISSUES: Normal marrow signal. IMPRESSION: 1. Small acute high left frontal cortical infarct. 2. Multiple remote infarcts and advanced chronic microvascular ischemic change. 3. Many chronic microhemorrhages throughout the cerebral and cerebellar hemispheres predominantly peripheral and concerning for cerebral amyloid angiopathy. Electronically signed by: Gilmore Molt MD 11/14/2024 11:43 PM EST RP Workstation: [id number]   DG Chest Port 1 View Result Date: 11/14/2024 CLINICAL DATA:  Dialysis patient EXAM: PORTABLE CHEST 1 VIEW COMPARISON:  Chest x-ray 03/17/2023 FINDINGS: Aloop recorder device overlies the left chest.  Right-sided central venous catheter tip projects over the caval atrial junction. The heart is mildly enlarged. The lungs are clear. There is no pleural effusion or pneumothorax. There is a healed left sixth rib fracture. No acute fractures are seen. IMPRESSION: 1. No active  disease. 2. Mild cardiomegaly. Electronically Signed   By: Greig Pique M.D.   On: 11/14/2024 22:11   CT HEAD WO CONTRAST Result Date: 11/14/2024 EXAM: CT HEAD WITHOUT CONTRAST 11/14/2024 05:16:58 PM TECHNIQUE: CT of the head was performed without the administration of intravenous contrast. Automated exposure control, iterative reconstruction, and/or weight based adjustment of the mA/kV was utilized to reduce the radiation dose to as low as reasonably achievable. COMPARISON: None available. CLINICAL HISTORY: dizziness FINDINGS: BRAIN AND VENTRICLES: No acute hemorrhage. No evidence of acute infarct. Moderate cerebral and cerebellar volume loss and moderate periventricular white matter disease. Chronic left frontal infarct. Chronic left cerebellar infarct. No hydrocephalus. No extra-axial collection. No mass effect or midline shift. ORBITS: Status post bilateral lens replacement. No acute abnormality. SINUSES: No acute abnormality. SOFT TISSUES AND SKULL: No acute soft tissue abnormality. No skull fracture. VASCULATURE: Moderate calcific atheromatous disease within carotid siphons. IMPRESSION: 1. No acute intracranial abnormality. 2. Moderate volume loss. Moderate chronic microvascular ischemic changes. 3. Chronic left frontal and left cerebellar infarcts. Electronically signed by: Donnice Mania MD 11/14/2024 05:33 PM EST RP Workstation: [id number]   Vitals:   11/15/24 0700 11/15/24 0730 11/15/24 0748 11/15/24 0749  BP: (!) 172/78 (!) 143/70 (!) 144/73   Pulse: 81 72 83 74  Resp:   17   Temp:   98.2 F (36.8 C)   TempSrc:   Oral   SpO2: 97% 97% 98% 97%  Weight:      Height:       PHYSICAL EXAM General:  Alert, well-nourished, well-developed patient in no acute distress Psych:  Mood and affect appropriate for situation, calm and cooperative with exam CV: Regular rate and rhythm on monitor Respiratory:  Regular, unlabored respirations on room air. Congested sounding, nonproductive cough noted  during exam.  GI: Abdomen soft and nontender  NEURO:  Mental Status: AA&Ox3, patient is able to give clear and coherent history Speech/Language: speech is without dysarthria or aphasia.  Naming, repetition, fluency, and comprehension intact.  Cranial Nerves:  II: PERRL. Visual fields full.  III, IV, VI: EOMI. Eyelids elevate symmetrically.  V: Sensation is intact to light touch and symmetrical to face.  VII: Face is symmetric resting and with movement VIII: Hearing is intact to voice. IX, X: Palate elevates symmetrically. Phonation is normal.  XI: Shoulder shrug 5/5. XII: Tongue protrudes midline Motor: 5/5 strength to all muscle groups tested.  Tone: is normal and bulk is normal Sensation: Intact to light touch bilaterally. Extinction absent to light touch to DSS.   Coordination: FTN intact bilaterally, HKS: no ataxia in BLE.No drift.  Gait: Deferred  ASSESSMENT/PLAN Ms. JIMENA WIECZOREK is a 83 y.o. female with history of DM2, prior CVAs with loop recorder placement, HTN, GERD, daily smoker, ESRD on HD presenting with imbalance and a room spinning sensation that is exacerbated by movement starting Thursday evening 11/13/24.   stroke:  small acute left frontal cortical infarct adjacent to previous infarct, etiology: likely large / small vessel disease  CT head: No acute intracranial abnormality. Moderate volume loss. Moderate chronic microvascular ischemic changes. Chronic left frontal and left cerebellar infarcts.  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 Widespread bilateral carotid atherosclerosis, including soft plaque versus small adherent thrombus at the left ICA origin.    MRI brain: small acute high left frontal cortical infarct. Multiple remote infarcts and advanced chronic microvascular ischemic change. Many chronic microhemorrhages throughout the cerebral and cerebellar hemispheres  predominantly peripheral and concerning for cerebral amyloid angiopathy.  2D Echo LVEF 60 to 65%, mild left atrial dilation Loop recorder interrogated, no episodes of afib noted LDL 81 HgbA1c 6.0 VTE prophylaxis - SQ heparin   aspirin  81 mg daily prior to admission, now on aspirin  81 mg daily and clopidogrel  75 mg daily for 3 months given left ICA plaque and then clopidogrel  alone. Therapy recommendations: none Disposition: Pending  Hx of Stroke/TIA CAA 01/2022 presented with right hand weakness.  MRI brain with scattered infarcts in the bilateral cerebral hemispheres that appear acute/subacute with the largest infarct in the left frontal lobe with involvement of the precentral gyrus.  Stroke felt to be secondary to suspected cardioembolic source.  MRI showed mild stenosis right ICA and PCA.  Carotid Doppler showed bilateral ICA with mild stenosis.  EF 55 to 60%.  LDL 53, A1c 4.9.  Loop recorder placed, patient discharged on aspirin  81 and Lipitor 40, no DAPT due to concern of CAA. 01/2022 MRI also showed extensive punctate chronic microhemorrhages throughout the cerebral and cerebellar hemispheres concerning for CAA.  Hypertension / hypotension Home meds: Norvasc  and midodrine Stable Avoid low BP during dialysis Home BP monitoring Blood pressure goal gradual normotension  Hyperlipidemia Home meds: Atorvastatin  40 mg LDL 79, goal < 70 Keep atorvastatin  at 40 mg, no increase of statin dose given LDL not far from goal and concern of CAA Continue statin at discharge  Diabetes type II Controlled Home meds: None HgbA1c 4.9, goal < 7.0 CBGs SSI as needed Close PCP follow-up for DM control  Tobacco Abuse Patient smokes 0.5 packs per day for many years      Ready to quit? No Nicotine  patch provided Counseled patient extensively on the importance of tobacco cessation.  Patient states that she understands but she still has difficult time to quit completely.  Other Stroke Risk  Factors Advanced age  Other Active Problems ESRD on HD Missed HD Friday S/p HD yesterday  To resume outpatient HD at discharge   Hospital day # 0  Neurology will sign off. Please call with questions. Pt will follow up with Dr Maree at [place] [facility] in about 4 weeks. Thanks for the consult.   Ary Cummins, MD PhD Stroke Neurology 11/15/2024 4:15 PM   To contact Stroke Continuity provider, please refer to [email or web address]. After hours, contact General Neurology

## 2024-11-15 NOTE — Plan of Care (Signed)

## 2024-11-15 NOTE — Consult Note (Signed)
 NEUROLOGY CONSULT NOTE   Date of service: November 15, 2024 Patient Name: Monica Hunt MRN:  [id number] DOB:  1941-10-19 Chief Complaint: dizziness, room spinning Requesting Provider: No att. providers found  History of Present Illness  Monica Hunt is a 83 y.o. female with hx of DM2, prior strokes with loop recorder placed, HTN, GERD, daily smoker(0.5ppd), ESRD on HD who presents with room spinning sensation and off balance.  Noticed this Thursday evening. Went to bed and then was persistent all day Friday. Came to the ED Friday evening. Worse with movements, resolves when she stays completely still.  Had MRI Brain which shows a high L frontal cortical stroke.  Neurology consulted for further workup.  Not feeling dizzy anymore.  LKW: 11/13/24 evening. Modified rankin score: 1-No significant post stroke disability and can perform usual duties with stroke symptoms IV Thrombolysis: not offered, outside window. EVT: not offered, low suspicion for LVO  NIHSS components Score: Comment  1a Level of Conscious 0[]  1[]  2[]  3[]      1b LOC Questions 0[]  1[]  2[]       1c LOC Commands 0[]  1[]  2[]       2 Best Gaze 0[]  1[]  2[]       3 Visual 0[]  1[]  2[]  3[]      4 Facial Palsy 0[]  1[]  2[]  3[]      5a Motor Arm - left 0[]  1[]  2[]  3[]  4[]  UN[]    5b Motor Arm - Right 0[]  1[]  2[]  3[]  4[]  UN[]    6a Motor Leg - Left 0[]  1[]  2[]  3[]  4[]  UN[]    6b Motor Leg - Right 0[]  1[]  2[]  3[]  4[]  UN[]    7 Limb Ataxia 0[]  1[]  2[]  UN[]      8 Sensory 0[]  1[]  2[]  UN[]      9 Best Language 0[]  1[]  2[]  3[]      10 Dysarthria 0[]  1[]  2[]  UN[]      11 Extinct. and Inattention 0[]  1[]  2[]       TOTAL: 0      ROS  Comprehensive ROS performed and pertinent positives documented in HPI   Past History   Past Medical History:  Diagnosis Date   Anemia    Cataracts, bilateral    Chronic kidney disease    ESRD  Dialysis M/W/F   Depression    Diabetes mellitus without complication (HCC)    GERD (gastroesophageal reflux  disease)    Hypertension    Hyperthyroidism    Pneumonia    Stroke (HCC)    symptom onset ~ 01/10/22, diagnosed 01/24/22 acute/subacute bilateral cerebral infarcts, largest left frontal lobe, extensive punctate chronic microhemorrages concerning for cerbral amyloid angiopathy    Past Surgical History:  Procedure Laterality Date   ABDOMINAL HYSTERECTOMY     vaginal   AV FISTULA PLACEMENT Right 05/02/2022   Procedure: RIGHT ARTERIOVENOUS (AV) FISTULA  CREATION;  Surgeon: Sheree Penne Bruckner, MD;  Location: [facility] OR;  Service: Vascular;  Laterality: Right;   CARPAL TUNNEL RELEASE Right    COLONOSCOPY WITH PROPOFOL  N/A 11/17/2014   Procedure: COLONOSCOPY WITH PROPOFOL ;  Surgeon: Gladis MARLA Louder, MD;  Location: [facility] ENDOSCOPY;  Service: Endoscopy;  Laterality: N/A;   LOOP RECORDER INSERTION N/A 01/25/2022   Procedure: LOOP RECORDER INSERTION;  Surgeon: Inocencio Soyla Gladis, MD;  Location: [facility] INVASIVE CV LAB;  Service: Cardiovascular;  Laterality: N/A;    Family History: Family History  Problem Relation Age of Onset   Kidney cancer Mother    Lung cancer Father     Social History  reports  that she has been smoking cigarettes. She has a 11.3 pack-year smoking history. She has never used smokeless tobacco. She reports that she does not drink alcohol and does not use drugs.  Allergies  Allergen Reactions   Bee Venom Anaphylaxis   Wasp Venom Anaphylaxis   Doxycycline Rash    Medications  No current facility-administered medications for this encounter.  Current Outpatient Medications:    acetaminophen  (TYLENOL ) 500 MG tablet, Take 1,000 mg by mouth every 6 (six) hours as needed for mild pain., Disp: , Rfl:    albuterol (VENTOLIN HFA) 108 (90 Base) MCG/ACT inhaler, Inhale 1-2 puffs into the lungs every 6 (six) hours as needed., Disp: , Rfl:    amLODipine  (NORVASC ) 2.5 MG tablet, Take 1 tablet (2.5 mg total) by mouth daily., Disp: 90 tablet, Rfl: 2   aspirin  EC 81 MG tablet, Take 1 tablet  (81 mg total) by mouth daily. Swallow whole., Disp: 30 tablet, Rfl: 11   atorvastatin  (LIPITOR) 40 MG tablet, Take 40 mg by mouth daily., Disp: , Rfl:    methimazole  (TAPAZOLE ) 10 MG tablet, 2 tablets Monday-Saturday and 3 tablets Sunday Orally daily; Duration: 90 days, Disp: , Rfl:    midodrine (PROAMATINE) 5 MG tablet, Take 5 mg by mouth 3 (three) times daily as needed (if BP is low)., Disp: , Rfl:    Multiple Minerals-Vitamins (CAL-MAG-ZINC-D) TABS, Take 1 tablet by mouth daily., Disp: , Rfl:    Multiple Vitamin (MULTIVITAMIN WITH MINERALS) TABS tablet, Take 1 tablet by mouth daily., Disp: , Rfl:    omeprazole (PRILOSEC) 40 MG capsule, Take 40 mg by mouth daily., Disp: , Rfl:    risedronate (ACTONEL) 35 MG tablet, Take 35 mg by mouth every 14 (fourteen) days. Every other Thursday, Disp: , Rfl:    rOPINIRole  (REQUIP ) 3 MG tablet, Take 3 mg by mouth at bedtime. Take 1 tab 1 to 3 hours before bedtime, Disp: , Rfl:    traZODone  (DESYREL ) 50 MG tablet, Take 100 mg by mouth at bedtime., Disp: , Rfl:    ACCU-CHEK SMARTVIEW test strip, , Disp: , Rfl:   Vitals   Vitals:   11/14/24 1634 11/14/24 2101 11/15/24 0123 11/15/24 0230  BP: (!) 172/74 (!) 167/77 (!) 151/84 (!) 150/57  Pulse: 90 84 (!) 57 74  Resp: 17 18 20 16   Temp: 97.8 F (36.6 C) 97.9 F (36.6 C)  97.8 F (36.6 C)  TempSrc:  Oral  Oral  SpO2: 100% 99% 98% 97%  Weight: 56.2 kg     Height: 5' 2 (1.575 m)       Body mass index is 22.68 kg/m.   Physical Exam   General: Laying comfortably in bed; in no acute distress.  HENT: Normal oropharynx and mucosa. Normal external appearance of ears and nose.  Neck: Supple, no pain or tenderness  CV: No JVD. No peripheral edema.  Pulmonary: Symmetric Chest rise. Normal respiratory effort.  Abdomen: Soft to touch, non-tender.  Ext: No cyanosis, edema, or deformity  Skin: No rash. Normal palpation of skin.   Musculoskeletal: Normal digits and nails by inspection. No clubbing.    Neurologic Examination  Mental status/Cognition: Alert, oriented to self, place, month and year, good attention.  Speech/language: Fluent, comprehension intact, object naming intact, repetition intact.  Cranial nerves:   CN II Pupils equal and reactive to light, no VF deficits    CN III,IV,VI EOM intact, no gaze preference or deviation, no nystagmus    CN V normal sensation in V1,  V2, and V3 segments bilaterally    CN VII no asymmetry, no nasolabial fold flattening    CN VIII normal hearing to speech    CN IX & X normal palatal elevation, no uvular deviation    CN XI 5/5 head turn and 5/5 shoulder shrug bilaterally    CN XII midline tongue protrusion    Motor:  Muscle bulk: normal, tone nornal, pronator drift none tremor none Mvmt Root Nerve  Muscle Right Left Comments  SA C5/6 Ax Deltoid 5 5   EF C5/6 Mc Biceps 5 5   EE C6/7/8 Rad Triceps 5 5   WF C6/7 Med FCR     WE C7/8 PIN ECU     F Ab C8/T1 U ADM/FDI 4+ 5   HF L1/2/3 Fem Illopsoas 5 5   KE L2/3/4 Fem Quad 5 5   DF L4/5 D Peron Tib Ant 5 5   PF S1/2 Tibial Grc/Sol 5 5    Sensation:  Light touch Intact throughout   Pin prick    Temperature    Vibration   Proprioception    Coordination/Complex Motor:  - Finger to Nose intact BL - Heel to shin intact BL - Rapid alternating movement are normal - Gait: deferred.  Labs/Imaging/Neurodiagnostic studies   CBC:  Recent Labs  Lab 2024/11/15 1652  WBC 6.9  HGB 9.8*  HCT 29.5*  MCV 89.9  PLT 189   Basic Metabolic Panel:  Lab Results  Component Value Date   NA 139 2024-11-15   K 4.0 11-15-24   CO2 24 11-15-24   GLUCOSE 105 (H) 2024/11/15   BUN 39 (H) 2024/11/15   CREATININE 7.11 (H) 15-Nov-2024   CALCIUM  9.1 11-15-24   GFRNONAA 5 (L) 11/15/2024   Lipid Panel:  Lab Results  Component Value Date   LDLCALC 79 05/29/2023   HgbA1c:  Lab Results  Component Value Date   HGBA1C 4.9 01/25/2022   Urine Drug Screen: No results found for: LABOPIA,  COCAINSCRNUR, LABBENZ, AMPHETMU, THCU, LABBARB  Alcohol Level No results found for: [facility] INR  Lab Results  Component Value Date   INR 1.0 01/24/2022   APTT  Lab Results  Component Value Date   APTT 27 01/24/2022   AED levels: No results found for: PHENYTOIN, ZONISAMIDE, LAMOTRIGINE, LEVETIRACETA  CT Head without contrast(Personally reviewed): CTH was negative for a large hypodensity concerning for a large territory infarct or hyperdensity concerning for an ICH  CT angio Head and Neck with contrast(Personally reviewed): pending  MRI Brain(Personally reviewed): L high frontal stroke.  ASSESSMENT   CHLOEY RICARD is a 83 y.o. female ith hx of DM2, prior strokes with loop recorder placed, HTN, GERD, daily smoker(0.5ppd), ESRD on HD who presents with room spinning sensation and off balance. Found to have a L high frontal stroke that is incidental and does not explain his symptoms.  Stroke appears embolic in nature, source is unclear and pending full workup.  RECOMMENDATIONS  - Frequent Neuro checks per stroke unit protocol - Recommend Vascular imaging with CTA head and neck - Recommend obtaining TTE - Recommend obtaining Lipid panel with LDL - Please start statin if LDL > 70 - Recommend HbA1c to evaluate for diabetes and how well it is controlled. - Antithrombotic - aspirin  81mg  daily along with plavix  75mg  daily x 21 days, followed by Aspirin  81mg  daily. - Recommend DVT ppx - SBP goal - permissive hypertension first 24 h < 220/110. Held home meds.  - Recommend Telemetry monitoring for  arrythmia - Recommend bedside swallow screen prior to PO intake. - Stroke education booklet - Recommend PT/OT/SLP consult - Counseled her on the importance of quitting smoking to reduce risk of stroke from all causes. - recommend loop recorder interrogation. ______________________________________________________________________    Signed, Cherika Jessie, MD [facility]

## 2024-11-15 NOTE — ED Notes (Signed)
 Patient transported to CT

## 2024-11-15 NOTE — ED Notes (Addendum)
 Pt alert, NAD, calm, interactive, resps e/u, speaking in clear complete sentences, VSS. Skin W&D. Denies pain or other sx at this time. Taken to HD. Daughter just left BS.

## 2024-11-15 NOTE — Progress Notes (Signed)
 500 ml  ultrafiltration, condition stable post hemodialysis, and patient transported to [address] [facility] without complication.  Report was given to the RN assuming care.

## 2024-11-15 NOTE — Progress Notes (Signed)
 Echocardiogram 2D Echocardiogram has been performed.  Zeke Aker N Maretta Overdorf,RDCS 11/15/2024, 10:01 AM

## 2024-11-16 DIAGNOSIS — E859 Amyloidosis, unspecified: Secondary | ICD-10-CM | POA: Diagnosis not present

## 2024-11-16 DIAGNOSIS — Z7982 Long term (current) use of aspirin: Secondary | ICD-10-CM

## 2024-11-16 DIAGNOSIS — I639 Cerebral infarction, unspecified: Secondary | ICD-10-CM | POA: Diagnosis not present

## 2024-11-16 DIAGNOSIS — I779 Disorder of arteries and arterioles, unspecified: Secondary | ICD-10-CM | POA: Diagnosis not present

## 2024-11-16 DIAGNOSIS — I680 Cerebral amyloid angiopathy: Secondary | ICD-10-CM

## 2024-11-16 DIAGNOSIS — I6381 Other cerebral infarction due to occlusion or stenosis of small artery: Secondary | ICD-10-CM | POA: Diagnosis not present

## 2024-11-16 DIAGNOSIS — I959 Hypotension, unspecified: Secondary | ICD-10-CM

## 2024-11-16 LAB — BASIC METABOLIC PANEL WITH GFR
Anion gap: 11 (ref 5–15)
BUN: 15 mg/dL (ref 8–23)
CO2: 26 mmol/L (ref 22–32)
Calcium: 8.1 mg/dL — ABNORMAL LOW (ref 8.9–10.3)
Chloride: 96 mmol/L — ABNORMAL LOW (ref 98–111)
Creatinine, Ser: 4.4 mg/dL — ABNORMAL HIGH (ref 0.44–1.00)
GFR, Estimated: 10 mL/min — ABNORMAL LOW (ref >60)
Glucose, Bld: 100 mg/dL — ABNORMAL HIGH (ref 70–99)
Potassium: 3.9 mmol/L (ref 3.5–5.1)
Sodium: 133 mmol/L — ABNORMAL LOW (ref 135–145)

## 2024-11-16 LAB — CBC
HCT: 26.6 % — ABNORMAL LOW (ref 36.0–46.0)
Hemoglobin: 8.9 g/dL — ABNORMAL LOW (ref 12.0–15.0)
MCH: 29.6 pg (ref 26.0–34.0)
MCHC: 33.5 g/dL (ref 30.0–36.0)
MCV: 88.4 fL (ref 80.0–100.0)
Platelets: 157 K/uL (ref 150–400)
RBC: 3.01 MIL/uL — ABNORMAL LOW (ref 3.87–5.11)
RDW: 17.4 % — ABNORMAL HIGH (ref 11.5–15.5)
WBC: 7.1 K/uL (ref 4.0–10.5)
nRBC: 0 % (ref 0.0–0.2)

## 2024-11-16 MED ORDER — CLOPIDOGREL BISULFATE 75 MG PO TABS: 75.0000 mg | ORAL_TABLET | Freq: Every day | ORAL | 0 refills | Status: AC

## 2024-11-16 MED ORDER — PANTOPRAZOLE SODIUM 40 MG PO TBEC: 40.0000 mg | DELAYED_RELEASE_TABLET | Freq: Every day | ORAL | 0 refills | Status: AC

## 2024-11-16 MED ORDER — NICOTINE 14 MG/24HR TD PT24: 14.0000 mg | MEDICATED_PATCH | Freq: Every day | TRANSDERMAL | 0 refills | Status: AC

## 2024-11-16 MED ORDER — ASPIRIN EC 81 MG PO TBEC: 81.0000 mg | DELAYED_RELEASE_TABLET | Freq: Every day | ORAL | 0 refills | Status: AC

## 2024-11-16 MED ORDER — CINACALCET HCL 30 MG PO TABS: 30.0000 mg | ORAL_TABLET | ORAL | 0 refills | Status: AC

## 2024-11-16 MED ORDER — CALCITRIOL 0.25 MCG PO CAPS: 0.7500 ug | ORAL_CAPSULE | ORAL | 0 refills | Status: AC

## 2024-11-16 MED ORDER — SEVELAMER CARBONATE 800 MG PO TABS: 800.0000 mg | ORAL_TABLET | Freq: Three times a day (TID) | ORAL | 0 refills | Status: AC

## 2024-11-16 NOTE — Plan of Care (Signed)
 Assisted with ambulation in room. Dialysis cath R chest dressing intact. Participation with therapy today. Nicotine  patch RUE and advised not to smoke with it in use. Daughter at bedside.   Problem: Education: Goal: Knowledge of disease or condition will improve Outcome: Adequate for Discharge   Problem: Education: Goal: Knowledge of patient specific risk factors will improve (DELETE if not current risk factor) Outcome: Adequate for Discharge   Problem: Ischemic Stroke/TIA Tissue Perfusion: Goal: Complications of ischemic stroke/TIA will be minimized Outcome: Adequate for Discharge   Problem: Self-Care: Goal: Ability to participate in self-care as condition permits will improve Outcome: Adequate for Discharge   Problem: Nutrition: Goal: Risk of aspiration will decrease Outcome: Adequate for Discharge   Problem: Safety: Goal: Ability to remain free from injury will improve Outcome: Adequate for Discharge   Problem: Skin Integrity: Goal: Risk for impaired skin integrity will decrease Outcome: Adequate for Discharge

## 2024-11-16 NOTE — Progress Notes (Signed)
 Nephrology Follow-Up Consult note  Outpatient dialysis unit:  Outpatient dialysis prescription: MWF, BFR 400, EDW 54 kg, 3 hours, 2K, 2.5 calcium , TDC, Mircera 50 mcg last given 11/17, Sensipar  30 mg each treatment, calcitriol  0.75 mcg each treatment   Assessment/Recommendations: Monica Hunt is a/an 83 y.o. female with a past medical history notable for ESRD on HD admitted with stroke.    # ESRD: Missed dialysis Friday but labs look good. Tolerated HD yesterday. Next HD outpatient on Tues   # Volume/ hypertension: Permissive hypertension.  Minimal ultrafiltration.  Add back home blood pressure medications as able per neurology   # Anemia of Chronic Kidney Disease: Hemoglobin ~9.  Hold Mircera in the setting of stroke.  Continue to monitor outpatient   # Secondary Hyperparathyroidism/Hyperphosphatemia: Continue home Sensipar  and calcitriol .  Continue sevelamer  800 mg with meals   # Vascular access: TDC with no issues per patient   # Left frontal cortical stroke: With dizziness.  Management per primary team and neurology  Okay for DC today from nephrology perspective     Recommendations conveyed to primary service.    [facility] 11/16/2024 9:05 AM  ___________________________________________________________  CC: dizziness  Interval History/Subjective: Patient feels well today with no complaints. No issues with HD yesterdya   Medications:  Current Facility-Administered Medications  Medication Dose Route Frequency Provider Last Rate Last Admin   acetaminophen  (TYLENOL ) tablet 650 mg  650 mg Oral Q4H PRN Foust, [name] L, NP       Or   acetaminophen  (TYLENOL ) 160 MG/5ML solution 650 mg  650 mg Per Tube Q4H PRN Foust, [name] L, NP       Or   acetaminophen  (TYLENOL ) suppository 650 mg  650 mg Rectal Q4H PRN Foust, [name] L, NP       aspirin  EC tablet 81 mg  81 mg Oral Daily Foust, [name] L, NP   81 mg at 11/15/24 1033   atorvastatin  (LIPITOR)  tablet 40 mg  40 mg Oral Daily Jerri Pfeiffer, MD       calcitRIOL  (ROCALTROL ) capsule 0.75 mcg  0.75 mcg Oral Q T,Th,Sat-1800 Mignonne Afonso J, MD   0.75 mcg at 11/15/24 2142   Chlorhexidine  Gluconate Cloth 2 % PADS 6 each  6 each Topical Q0600 Macel Jayson PARAS, MD   6 each at 11/16/24 9374   cinacalcet  (SENSIPAR ) tablet 30 mg  30 mg Oral Q T,Th,Sat-1800 Avaiyah Strubel J, MD   30 mg at 11/15/24 2143   clopidogrel  (PLAVIX ) tablet 75 mg  75 mg Oral Daily Foust, [name] L, NP   75 mg at 11/15/24 1033   heparin  injection 5,000 Units  5,000 Units Subcutaneous Q8H Foust, [name] L, NP   5,000 Units at 11/16/24 9377   insulin  aspart (novoLOG ) injection 0-5 Units  0-5 Units Subcutaneous QHS Foust, [name] L, NP       insulin  aspart (novoLOG ) injection 0-6 Units  0-6 Units Subcutaneous TID WC Foust, [name] L, NP       methimazole  (TAPAZOLE ) tablet 20 mg  20 mg Oral Once per day on Monday Tuesday Wednesday Thursday Friday Saturday Jurline Rockie CROME, NP   20 mg at 11/15/24 1041   And   methimazole  (TAPAZOLE ) tablet 30 mg  30 mg Oral Every Sunday Foust, [name] L, NP       nicotine  (NICODERM CQ  - dosed in mg/24 hours) patch 14 mg  14 mg Transdermal Daily Foust, [name] L, NP       nicotine  polacrilex (  NICORETTE ) gum 2 mg  2 mg Oral PRN Foust, [name] L, NP       ondansetron  (ZOFRAN ) injection 4 mg  4 mg Intravenous Q6H PRN Rashid, Farhan, MD   4 mg at 11/15/24 1218   pantoprazole  (PROTONIX ) EC tablet 40 mg  40 mg Oral Daily Foust, [name] L, NP   40 mg at 11/15/24 1033   rOPINIRole  (REQUIP ) tablet 3 mg  3 mg Oral QHS Rashid, Farhan, MD   3 mg at 11/15/24 2143   senna-docusate (Senokot-S) tablet 1 tablet  1 tablet Oral QHS PRN Foust, [name] L, NP       sevelamer  carbonate (RENVELA ) tablet 800 mg  800 mg Oral TID WC Ahilyn Nell J, MD   800 mg at 11/16/24 9177   traZODone  (DESYREL ) tablet 100 mg  100 mg Oral QHS Rashid, Farhan, MD   100 mg at 11/15/24 2142      Review of Systems: 10 systems reviewed and negative except per interval  history/subjective  Physical Exam: Vitals:   11/16/24 0348 11/16/24 0810  BP: (!) 141/96 138/72  Pulse: 75 68  Resp: 16 16  Temp: 98.9 F (37.2 C) 98.4 F (36.9 C)  SpO2: 100% 97%   No intake/output data recorded.  Intake/Output Summary (Last 24 hours) at 11/16/2024 9094 Last data filed at 11/15/2024 1704 Gross per 24 hour  Intake 0 ml  Output 500 ml  Net -500 ml   Constitutional: well-appearing, no acute distress ENMT: ears and nose without scars or lesions, MMM CV: normal rate, no edema Respiratory: bilateral chest rise, normal work of breathing Gastrointestinal: soft, non-tender, no palpable masses or hernias Skin: no visible lesions or rashes Psych: alert, judgement/insight appropriate, appropriate mood and affect   Test Results I personally reviewed new and old clinical labs and radiology tests Lab Results  Component Value Date   NA 133 (L) 11/16/2024   K 3.9 11/16/2024   CL 96 (L) 11/16/2024   CO2 26 11/16/2024   BUN 15 11/16/2024   CREATININE 4.40 (H) 11/16/2024   CALCIUM  8.1 (L) 11/16/2024   ALBUMIN 3.0 (L) 11/15/2024   PHOS 2.2 (L) 11/15/2024    CBC Recent Labs  Lab 11/14/24 1652 11/15/24 1400 11/16/24 0425  WBC 6.9 5.8 7.1  NEUTROABS  --  4.1  --   HGB 9.8* 8.8* 8.9*  HCT 29.5* 26.7* 26.6*  MCV 89.9 89.6 88.4  PLT 189 161 157

## 2024-11-16 NOTE — Evaluation (Signed)
 Occupational Therapy Evaluation Patient Details Name: Monica Hunt MRN: [id number] DOB: 10-Aug-1941 Today's Date: 11/16/2024   History of Present Illness   Pt is an 83 y/o F who presented to [facility] with c/c dizziness. MRI brain revealed: high L frontal cortical infarct. PMHx: HTN, GERD, ESRD on HD, DMII, prior CVA with loop recorder.     Clinical Impressions Pt greeted in bed, agreeable for OT eval. AOX4. PTA, pt was living alone and was indep with ADLs, IADLs and mobility without AD. She has PRN support from local family. Manages her medications indep. Functionally, she presents with intact strength, coordination, and sensation x4 extremities. Minor balance deficits - would benefit from further assessment from PT. Pt completed all UB/LB ADLs with supervision, and ambulation with SUP-intermittent CGA due to mild balance deficits. Denied dizziness.   Given pt is near her baseline, do not recommend any post-acute OT needs. Would recommend increased assistance with household maintenance (IADLs).      If plan is discharge home, recommend the following:   Assistance with cooking/housework     Functional Status Assessment         Equipment Recommendations   None recommended by OT     Recommendations for Other Services   PT consult     Precautions/Restrictions   Precautions Precautions: Fall Recall of Precautions/Restrictions: Intact Restrictions Weight Bearing Restrictions Per Provider Order: No     Mobility Bed Mobility Overal bed mobility: Modified Independent             General bed mobility comments: use of bed rails, HOB flat    Transfers Overall transfer level: Needs assistance Equipment used: None Transfers: Sit to/from Stand, Bed to chair/wheelchair/BSC Sit to Stand: Supervision     Step pivot transfers: Supervision, Contact guard assist     General transfer comment: intermittent CGA for mild LOB when changing directions      Balance Overall  balance assessment: Mild deficits observed, not formally tested (occasional nBOS during ambulation in hallway)                                         ADL either performed or assessed with clinical judgement   ADL Overall ADL's : Needs assistance/impaired Eating/Feeding: Independent   Grooming: Supervision/safety;Standing;Oral care;Wash/dry face   Upper Body Bathing: Supervision/ safety   Lower Body Bathing: Supervison/ safety   Upper Body Dressing : Independent   Lower Body Dressing: Supervision/safety   Toilet Transfer: Supervision/safety   Toileting- Clothing Manipulation and Hygiene: Supervision/safety       Functional mobility during ADLs: Supervision/safety;Contact guard assist       Vision Baseline Vision/History: 1 Wears glasses Ability to See in Adequate Light: 0 Adequate Patient Visual Report: No change from baseline Vision Assessment?: Wears glasses for reading     Perception         Praxis         Pertinent Vitals/Pain Pain Assessment Pain Assessment: No/denies pain     Extremity/Trunk Assessment Upper Extremity Assessment Upper Extremity Assessment: Overall WFL for tasks assessed   Lower Extremity Assessment Lower Extremity Assessment: Generalized weakness   Cervical / Trunk Assessment Cervical / Trunk Assessment: Normal   Communication Communication Communication: No apparent difficulties   Cognition Arousal: Alert Behavior During Therapy: WFL for tasks assessed/performed Cognition: No apparent impairments             OT - Cognition Comments:  very pleasant & participatory                 Following commands: Intact       Cueing  General Comments   Cueing Techniques: Verbal cues  VSS throughout   Exercises     Shoulder Instructions      Home Living Family/patient expects to be discharged to:: Private residence Living Arrangements: Alone Available Help at Discharge: Family;Available  PRN/intermittently Type of Home: House Home Access: Stairs to enter [employer] of Steps: 5 STE Entrance Stairs-Rails: Can reach both Home Layout: One level     Bathroom Shower/Tub: Producer, Television/film/video: Standard     Home Equipment: Grab bars - toilet;Grab bars - tub/shower;Shower seat          Prior Functioning/Environment Prior Level of Function : Independent/Modified Independent (denies falls)             Mobility Comments: none PTA ADLs Comments: indep, manages meds, does all IADLs    OT Problem List: Impaired balance (sitting and/or standing)   OT Treatment/Interventions:        OT Goals(Current goals can be found in the care plan section)   Acute Rehab OT Goals Patient Stated Goal: go home   OT Frequency:       Co-evaluation              AM-PAC OT 6 Clicks Daily Activity     Outcome Measure Help from another person eating meals?: None Help from another person taking care of personal grooming?: None Help from another person toileting, which includes using toliet, bedpan, or urinal?: None Help from another person bathing (including washing, rinsing, drying)?: A Little Help from another person to put on and taking off regular upper body clothing?: None Help from another person to put on and taking off regular lower body clothing?: None 6 Click Score: 23   End of Session Nurse Communication: Mobility status  Activity Tolerance: Patient tolerated treatment well Patient left: in chair;with call bell/phone within reach;with chair alarm set;with nursing/sitter in room  OT Visit Diagnosis: Unsteadiness on feet (R26.81);Dizziness and giddiness (R42)                Time: 9197-9173 OT Time Calculation (min): 24 min Charges:  OT General Charges $OT Visit: 1 Visit OT Evaluation $OT Eval Low Complexity: 1 Low  Monica Hunt, OTR/L [facility] [phone] Secure Chat Preferred  Monica Hunt 11/16/2024, 10:15 AM

## 2024-11-16 NOTE — Plan of Care (Signed)
  Problem: Education: Goal: Knowledge of disease or condition will improve Outcome: Progressing Goal: Knowledge of secondary prevention will improve (MUST DOCUMENT ALL) Outcome: Progressing Goal: Knowledge of patient specific risk factors will improve (DELETE if not current risk factor) Outcome: Progressing   Problem: Ischemic Stroke/TIA Tissue Perfusion: Goal: Complications of ischemic stroke/TIA will be minimized Outcome: Progressing   Problem: Health Behavior/Discharge Planning: Goal: Ability to manage health-related needs will improve Outcome: Progressing   Problem: Education: Goal: Ability to describe self-care measures that may prevent or decrease complications (Diabetes Survival Skills Education) will improve Outcome: Progressing

## 2024-11-16 NOTE — Discharge Planning (Signed)
 [facility] Patient Discharge Orders- [facility] CLINIC: [place]  Patient's name: Monica Hunt Admit/DC Dates: 11/14/2024 - 11/16/2024  Discharge Diagnoses: Acute CVA noted on MRI-  small acute high left frontal cortical infarct and multiple remote infarcts and many chronic microhemorrhages    HD ORDER CHANGES: Heparin  change: no  EDW Change: no Bath Change: no       ANEMIA MANAGEMENT: Aranesp: Given: no    PRBC's Given: no  ESA dose for discharge: mircera -hold in setting of stroke IV Iron dose at discharge: no change   BONE/MINERAL MEDICATIONS: Hectorol/Calcitriol  change: no Sensipar /Parsabiv change: no   ACCESS INTERVENTION/CHANGE: no Details:   RECENT LABS: Recent Labs  Lab 11/15/24 1858 11/16/24 0425  K 3.7 3.9  CALCIUM  8.4* 8.1*  ALBUMIN 3.0*  --   PHOS 2.2*  --    Recent Labs  Lab 11/16/24 0425  HGB 8.9*      IV ANTIBIOTICS: no Details:   OTHER ANTICOAGULATION:  On Eliquis: no  On Coumadin: no   OTHER/APPTS/LAB ORDERS:     D/C Meds to be reconciled by nurse after every discharge.  Completed By: Manuelita Labella PA-C   Reviewed by: MD:______ RN_______

## 2024-11-16 NOTE — Discharge Summary (Addendum)
 Physician Discharge Summary   Patient: Monica Hunt MRN: [id number] DOB: 1941-10-19  Admit date:     11/14/2024  Discharge date: 11/16/24  Discharge Physician: Deliliah Room   PCP: Cicero Aureliano SAUNDERS, MD   Recommendations at discharge:   PCP in 1 week.  Follow-up outpatient with neurology in 1 month.  Continue taking aspirin  and Plavix  for 21 days and then Plavix  alone. Return to the emergency room if you develop any neurological deficits including slurred speech, facial droop, weakness/numbness of any extremity. Go to your hemodialysis as per schedule with the next hemodialysis session on Tuesday, 11/18/2024  Discharge Diagnoses: Principal Problem:   Acute cerebrovascular accident (CVA) (HCC) Active Problems:   Essential hypertension   Anemia in chronic kidney disease   Gastroesophageal reflux disease   HLD (hyperlipidemia)   Restless legs   Hyperthyroidism   Tobacco use disorder   Hospital Course: 83 y.o. year old female with past medical history of type 2 diabetes mellitus, ESRD (MWF), hypertension, anxiety, anemia of chronic disease, GERD, hyperlipidemia, restless leg syndrome, hyperthyroidism, and prior CVA in 2023.  She presents to [facility] ED with sensation of room spinning as well as imbalance that began Thursday evening.  CT head obtained there without any acute intracranial abnormality. MRI subsequently ordered and showed small acute high left frontal cortical infarct and multiple remote infarcts and many chronic microhemorrhages.  Neurology consulted.  Patient started on aspirin  and Plavix  for 21 days and then Plavix  alone.  She will be continued on high intensity statin. Transthoracic echocardiogram is unremarkable CTA head and neck showed occluded right vertebral artery in the v3 segment with retrograde reconstitution of the distal right v4 and PICA. No other large vessel occlusion. Widespread bilateral carotid atherosclerosis   Nephrologist, Dr. Macel consulted  for hemodialysis management Patient underwent hemodialysis on 11/15/2024 without any complications.  Next dialysis session will be on Tuesday, 11/18/2024  Case discussed with neurologist, Dr.Xu as well as nephrologist, Dr. Macel on the day of the discharge as well.  Patient was discharged home.  She lives by herself and is independent activities of daily life.  She denies any needs including home health services.  Advised to follow-up outpatient with PCP as well as neurology.  She will go to hemodialysis as per her schedule.    Pain control - [place]  Controlled Substance Reporting System database was reviewed. and patient was instructed, not to drive, operate heavy machinery, perform activities at heights, swimming or participation in water activities or provide baby-sitting services while on Pain, Sleep and Anxiety Medications; until their outpatient Physician has advised to do so again. Also recommended to not to take more than prescribed Pain, Sleep and Anxiety Medications.  Consultants: Neurology, nephrology Procedures performed: None Disposition: Home Diet recommendation:  Cardiac diet DISCHARGE MEDICATION: Allergies as of 11/16/2024       Reactions   Bee Venom Anaphylaxis   Wasp Venom Anaphylaxis   Doxycycline Rash        Medication List     TAKE these medications    Accu-Chek SmartView test strip Generic drug: glucose blood   acetaminophen  500 MG tablet Commonly known as: TYLENOL  Take 1,000 mg by mouth every 6 (six) hours as needed for mild pain.   albuterol 108 (90 Base) MCG/ACT inhaler Commonly known as: VENTOLIN HFA Inhale 1-2 puffs into the lungs every 6 (six) hours as needed.   amLODipine  2.5 MG tablet Commonly known as: NORVASC  Take 1 tablet (2.5 mg total) by mouth daily.  aspirin  EC 81 MG tablet Take 1 tablet (81 mg total) by mouth daily. Swallow whole.   atorvastatin  40 MG tablet Commonly known as: LIPITOR Take 40 mg by mouth daily.    Cal-Mag-Zinc-D Tabs Take 1 tablet by mouth daily.   calcitRIOL  0.25 MCG capsule Commonly known as: ROCALTROL  Take 3 capsules (0.75 mcg total) by mouth every Tuesday, Thursday, and Saturday at 6 PM. Start taking on: November 18, 2024   cinacalcet  30 MG tablet Commonly known as: SENSIPAR  Take 1 tablet (30 mg total) by mouth every Tuesday, Thursday, and Saturday at 6 PM. Start taking on: November 18, 2024   clopidogrel  75 MG tablet Commonly known as: PLAVIX  Take 1 tablet (75 mg total) by mouth daily. Start taking on: November 17, 2024   methimazole  10 MG tablet Commonly known as: TAPAZOLE  2 tablets Monday-Saturday and 3 tablets Sunday Orally daily; Duration: 90 days   midodrine 5 MG tablet Commonly known as: PROAMATINE Take 5 mg by mouth 3 (three) times daily as needed (if BP is low).   multivitamin with minerals Tabs tablet Take 1 tablet by mouth daily.   nicotine  14 mg/24hr patch Commonly known as: NICODERM CQ  - dosed in mg/24 hours Place 1 patch (14 mg total) onto the skin daily. Start taking on: November 17, 2024   omeprazole 40 MG capsule Commonly known as: PRILOSEC Take 40 mg by mouth daily.   pantoprazole  40 MG tablet Commonly known as: PROTONIX  Take 1 tablet (40 mg total) by mouth daily. Start taking on: November 17, 2024   risedronate 35 MG tablet Commonly known as: ACTONEL Take 35 mg by mouth every 14 (fourteen) days. Every other Thursday   rOPINIRole  3 MG tablet Commonly known as: REQUIP  Take 3 mg by mouth at bedtime. Take 1 tab 1 to 3 hours before bedtime   sevelamer  carbonate 800 MG tablet Commonly known as: RENVELA  Take 1 tablet (800 mg total) by mouth 3 (three) times daily with meals.   traZODone  50 MG tablet Commonly known as: DESYREL  Take 100 mg by mouth at bedtime.        Follow-up Information     Schwartzman, Aureliano SAUNDERS, MD. Schedule an appointment as soon as possible for a visit .   Specialty: Internal Medicine Contact information: [address] [place] [address] [phone]         [facility]. Schedule an appointment as soon as possible for a visit in 1 month(s).   Contact information: [address] [place], [address] [place]  [address] [phone]               Discharge Exam: Fredricka Weights   11/14/24 1634 11/15/24 1348 11/15/24 1704  Weight: 56.2 kg 56.2 kg 55.7 kg   Constitutional: NAD, calm, comfortable Eyes: PERRL, lids and conjunctivae normal ENMT: Mucous membranes are moist. Posterior pharynx clear of any exudate or lesions.Normal dentition.  Neck: normal, supple, no masses, no thyromegaly Respiratory: clear to auscultation bilaterally, no wheezing, no crackles. Normal respiratory effort. No accessory muscle use.  Cardiovascular: Regular rate and rhythm, no murmurs / rubs / gallops. No extremity edema. 2+ pedal pulses. No carotid bruits.  Abdomen: no tenderness, no masses palpated. No hepatosplenomegaly. Bowel sounds positive.  Musculoskeletal: no clubbing / cyanosis. No joint deformity upper and lower extremities. Good ROM, no contractures. Normal muscle tone.  Skin: no rashes, lesions, ulcers. No induration Neurologic: CN 2-12 grossly intact. Sensation intact, DTR normal. Strength 5/5 x all 4 extremities.  Psychiatric: Normal judgment and insight. Alert and oriented x 3. Normal mood.    Condition at discharge: good  The results of significant diagnostics from this hospitalization (including imaging, microbiology, ancillary and laboratory) are listed below for reference.   Imaging Studies:   Microbiology: Results for orders placed or performed during the hospital encounter of 01/24/22  Resp Panel by RT-PCR (Flu A&B, Covid) Nasopharyngeal Swab     Status: None   Collection Time: 01/24/22  5:16 PM   Specimen: Nasopharyngeal Swab; Nasopharyngeal(NP) swabs in vial transport medium  Result Value Ref Range Status   SARS Coronavirus 2 by RT PCR NEGATIVE  NEGATIVE Final    Comment: (NOTE) SARS-CoV-2 target nucleic acids are NOT DETECTED.  The SARS-CoV-2 RNA is generally detectable in upper respiratory specimens during the acute phase of infection. The lowest concentration of SARS-CoV-2 viral copies this assay can detect is 138 copies/mL. A negative result does not preclude SARS-Cov-2 infection and should not be used as the sole basis for treatment or other patient management decisions. A negative result may occur with  improper specimen collection/handling, submission of specimen other than nasopharyngeal swab, presence of viral mutation(s) within the areas targeted by this assay, and inadequate number of viral copies(<138 copies/mL). A negative result must be combined with clinical observations, patient history, and epidemiological information. The expected result is Negative.  Fact Sheet for Patients:  bloggercourse.com  Fact Sheet for Healthcare Providers:  seriousbroker.it  This test is no t yet approved or cleared by the [place]  FDA and  has been authorized for detection and/or diagnosis of SARS-CoV-2 by FDA under an Emergency Use Authorization (EUA). This EUA will remain  in effect (meaning this test can be used) for the duration of the COVID-19 declaration under Section 564(b)(1) of the Act, 21 U.S.C.section 360bbb-3(b)(1), unless the authorization is terminated  or revoked sooner.       Influenza A by PCR NEGATIVE NEGATIVE Final   Influenza B by PCR NEGATIVE NEGATIVE Final    Comment: (NOTE) The Xpert Xpress SARS-CoV-2/FLU/RSV plus assay is intended as an aid in the diagnosis of influenza from Nasopharyngeal swab specimens and should not be used as a sole basis for treatment. Nasal washings and aspirates are unacceptable for Xpert Xpress SARS-CoV-2/FLU/RSV testing.  Fact Sheet for Patients: bloggercourse.com  Fact Sheet for Healthcare  Providers: seriousbroker.it  This test is not yet approved or cleared by the [place]  FDA and has been authorized for detection and/or diagnosis of SARS-CoV-2 by FDA under an Emergency Use Authorization (EUA). This EUA will remain in effect (meaning this test can be used) for the duration of the COVID-19 declaration under Section 564(b)(1) of the Act, 21 U.S.C. section 360bbb-3(b)(1), unless the authorization is terminated or revoked.  Performed at [facility], [address] [place], [place] [address]     Labs: CBC: Recent Labs  Lab 11/14/24 1652 11/15/24 1400 11/16/24 0425  WBC 6.9 5.8 7.1  NEUTROABS  --  4.1  --   HGB 9.8* 8.8* 8.9*  HCT 29.5* 26.7* 26.6*  MCV 89.9 89.6 88.4  PLT 189 161 157   Basic Metabolic Panel: Recent Labs  Lab 11/14/24 1652 11/15/24 1400 11/15/24 1858 11/16/24 0425  NA 139  --  135 133*  K 4.0  --  3.7 3.9  CL 99  --  94* 96*  CO2 24  --  28 26  GLUCOSE 105*  --  105* 100*  BUN 39*  --  11 15  CREATININE 7.11*  --  3.13* 4.40*  CALCIUM  9.1  --  8.4* 8.1*  MG  --  2.2  --   --   PHOS  --  3.7 2.2*  --    Liver Function Tests: Recent Labs  Lab 11/14/24 1652 11/15/24 1858  AST 17  --   ALT 15  --   ALKPHOS 149*  --   BILITOT 0.7  --   PROT 7.3  --   ALBUMIN 3.6 3.0*   CBG: Recent Labs  Lab 11/14/24 1656 11/15/24 1022 11/15/24 1208  GLUCAP 108* 95 148*    Discharge time spent: 41 minutes.  Signed: Deliliah Room, MD [facility] 11/16/2024

## 2024-11-16 NOTE — Care Management Obs Status (Signed)
 MEDICARE OBSERVATION STATUS NOTIFICATION   Patient Details  Name: Monica Hunt MRN: [id number] Date of Birth: May 13, 1941   Medicare Observation Status Notification Given:  Yes    Smokey Melott G., RN 11/16/2024, 9:02 AM

## 2024-11-17 ENCOUNTER — Telehealth: Payer: Self-pay | Admitting: Physician Assistant

## 2024-11-17 LAB — GLUCOSE, CAPILLARY
Glucose-Capillary: 86 mg/dL (ref 70–99)
Glucose-Capillary: 113 mg/dL — ABNORMAL HIGH (ref 70–99)
Glucose-Capillary: 112 mg/dL — ABNORMAL HIGH (ref 70–99)
Glucose-Capillary: 103 mg/dL — ABNORMAL HIGH (ref 70–99)

## 2024-11-17 LAB — HEPATITIS B SURFACE ANTIBODY, QUANTITATIVE: Hep B S AB Quant (Post): 402 m[IU]/mL

## 2024-11-17 NOTE — Telephone Encounter (Signed)
 Transition of care contact from inpatient facility  Date of Discharge: 11/16/24 Date of Contact: 11/17/24 Method of contact: Phone  Attempted to contact patient to discuss transition of care from inpatient admission. Patient did not answer the phone. Message was left on the patient's voicemail with call back instructions.  Lucie Collet, PA-C 11/17/2024, 1:58 PM  [facility] Pager: [phone]

## 2024-11-27 ENCOUNTER — Encounter

## 2024-11-29 ENCOUNTER — Ambulatory Visit: Attending: Cardiology

## 2024-12-01 LAB — CUP PACEART REMOTE DEVICE CHECK
Date Time Interrogation Session: 20251206000522
Implantable Pulse Generator Implant Date: 20230201

## 2024-12-03 ENCOUNTER — Ambulatory Visit: Payer: Self-pay | Admitting: Cardiology

## 2024-12-04 ENCOUNTER — Other Ambulatory Visit

## 2024-12-04 NOTE — Progress Notes (Signed)
 Remote Loop Recorder Transmission

## 2024-12-23 ENCOUNTER — Encounter (HOSPITAL_COMMUNITY): Payer: Self-pay

## 2024-12-23 ENCOUNTER — Other Ambulatory Visit: Payer: Self-pay

## 2024-12-23 ENCOUNTER — Telehealth (HOSPITAL_COMMUNITY): Payer: Self-pay

## 2024-12-23 ENCOUNTER — Emergency Department (HOSPITAL_COMMUNITY)
Admission: EM | Admit: 2024-12-23 | Discharge: 2024-12-23 | Disposition: A | Discharge status: Home / Self Care | Attending: Emergency Medicine | Admitting: Emergency Medicine

## 2024-12-23 DIAGNOSIS — T8243XA Leakage of vascular dialysis catheter, initial encounter: Secondary | ICD-10-CM | POA: Insufficient documentation

## 2024-12-23 DIAGNOSIS — N186 End stage renal disease: Secondary | ICD-10-CM | POA: Insufficient documentation

## 2024-12-23 DIAGNOSIS — Z992 Dependence on renal dialysis: Secondary | ICD-10-CM | POA: Diagnosis not present

## 2024-12-23 DIAGNOSIS — Z7982 Long term (current) use of aspirin: Secondary | ICD-10-CM | POA: Diagnosis not present

## 2024-12-23 NOTE — Telephone Encounter (Signed)
 [facility] called regarding and urgent hemodialysis catheter exchange. Patient's hemodialysis catheter has a hole in the venous lumen. There is a hemostat clamp in place to maintain hemostasis. I advise the dialysis center to send the patient the ED, due to possibility of exsanguination hemorrhaging.  Physician referral for the catheter exchange is in the patient's chart.

## 2024-12-23 NOTE — ED Triage Notes (Signed)
 PT arrives via POV. PT reports her hemodialysis catheter began leaking today while she was doing dialysis. Pt sent to ED to have it evaluated. PT denies any complaints at this time. Pt is AxOx4.

## 2024-12-23 NOTE — Discharge Instructions (Addendum)
 Please call Dr. Claretta office to schedule your catheter exchange tomorrow. Please leave the line clamped. If you have any concerns, new or worsening symptoms, please return to the nearest ER for re-evaluation.

## 2024-12-23 NOTE — Consult Note (Addendum)
 " Hospital Consult    Reason for Consult:  fractured [facility] Referring Physician:  Bernis, [place] [facility] ED) MRN #:  [id number]  History of Present Illness: This is a 83 y.o. female history of end-stage renal disease currently dialyzing via right IJ tunneled dialysis catheter that she states has been present for many years.  She also has a fistula in the right upper extremity which she has not used.  Previously was on peritoneal dialysis.  She is here to be evaluated due to the leaking catheter.  Past Medical History:  Diagnosis Date   Anemia    Cataracts, bilateral    Chronic kidney disease    ESRD  Dialysis M/W/F   Depression    Diabetes mellitus without complication (HCC)    GERD (gastroesophageal reflux disease)    Hypertension    Hyperthyroidism    Pneumonia    Stroke (HCC)    symptom onset ~ 01/10/22, diagnosed 01/24/22 acute/subacute bilateral cerebral infarcts, largest left frontal lobe, extensive punctate chronic microhemorrages concerning for cerbral amyloid angiopathy    Past Surgical History:  Procedure Laterality Date   ABDOMINAL HYSTERECTOMY     vaginal   AV FISTULA PLACEMENT Right 05/02/2022   Procedure: RIGHT ARTERIOVENOUS (AV) FISTULA  CREATION;  Surgeon: Sheree Penne Bruckner, MD;  Location: [facility] OR;  Service: Vascular;  Laterality: Right;   CARPAL TUNNEL RELEASE Right    COLONOSCOPY WITH PROPOFOL  N/A 11/17/2014   Procedure: COLONOSCOPY WITH PROPOFOL ;  Surgeon: Gladis MARLA Louder, MD;  Location: [facility] ENDOSCOPY;  Service: Endoscopy;  Laterality: N/A;   LOOP RECORDER INSERTION N/A 01/25/2022   Procedure: LOOP RECORDER INSERTION;  Surgeon: Inocencio Soyla Gladis, MD;  Location: [facility] INVASIVE CV LAB;  Service: Cardiovascular;  Laterality: N/A;    Allergies[1]  Prior to Admission medications  Medication Sig Start Date End Date Taking? Authorizing Provider  ACCU-CHEK SMARTVIEW test strip  01/12/20   [provider]  acetaminophen  (TYLENOL ) 500 MG tablet Take 1,000 mg  by mouth every 6 (six) hours as needed for mild pain.    [provider]  albuterol (VENTOLIN HFA) 108 (90 Base) MCG/ACT inhaler Inhale 1-2 puffs into the lungs every 6 (six) hours as needed. 06/03/23   [provider]  amLODipine  (NORVASC ) 2.5 MG tablet Take 1 tablet (2.5 mg total) by mouth daily. 06/25/23   Croitoru, Mihai, MD  aspirin  EC 81 MG tablet Take 1 tablet (81 mg total) by mouth daily. Swallow whole. 11/16/24   Rashid, Farhan, MD  atorvastatin  (LIPITOR) 40 MG tablet Take 40 mg by mouth daily.    [provider]  calcitRIOL  (ROCALTROL ) 0.25 MCG capsule Take 3 capsules (0.75 mcg total) by mouth every Tuesday, Thursday, and Saturday at 6 PM. 11/18/24   Dino Antu, MD  cinacalcet  (SENSIPAR ) 30 MG tablet Take 1 tablet (30 mg total) by mouth every Tuesday, Thursday, and Saturday at 6 PM. 11/18/24   Dino Antu, MD  clopidogrel  (PLAVIX ) 75 MG tablet Take 1 tablet (75 mg total) by mouth daily. 11/17/24   Rashid, Farhan, MD  methimazole  (TAPAZOLE ) 10 MG tablet 2 tablets Monday-Saturday and 3 tablets Sunday Orally daily; Duration: 90 days    [provider]  midodrine (PROAMATINE) 5 MG tablet Take 5 mg by mouth 3 (three) times daily as needed (if BP is low). 01/10/22   [provider]  Multiple Minerals-Vitamins (CAL-MAG-ZINC-D) TABS Take 1 tablet by mouth daily.    [provider]  Multiple Vitamin (MULTIVITAMIN WITH MINERALS) TABS tablet Take 1  tablet by mouth daily.    [provider]  nicotine  (NICODERM CQ  - DOSED IN MG/24 HOURS) 14 mg/24hr patch Place 1 patch (14 mg total) onto the skin daily. 11/17/24   Rashid, Farhan, MD  omeprazole (PRILOSEC) 40 MG capsule Take 40 mg by mouth daily.    [provider]  pantoprazole  (PROTONIX ) 40 MG tablet Take 1 tablet (40 mg total) by mouth daily. 11/17/24   Rashid, Farhan, MD  risedronate (ACTONEL) 35 MG tablet Take 35 mg by mouth every 14 (fourteen) days. Every other Thursday     [provider]  rOPINIRole  (REQUIP ) 3 MG tablet Take 3 mg by mouth at bedtime. Take 1 tab 1 to 3 hours before bedtime    [provider]  sevelamer  carbonate (RENVELA ) 800 MG tablet Take 1 tablet (800 mg total) by mouth 3 (three) times daily with meals. 11/16/24   Rashid, Farhan, MD  traZODone  (DESYREL ) 50 MG tablet Take 100 mg by mouth at bedtime.    [provider]    Social History   Socioeconomic History   Marital status: Divorced    Spouse name: Not on file   Number of children: Not on file   Years of education: Not on file   Highest education level: Not on file  Occupational History   Not on file  Tobacco Use   Smoking status: Every Day    Current packs/day: 0.25    Average packs/day: 0.3 packs/day for 45.0 years (11.3 ttl pk-yrs)    Types: Cigarettes   Smokeless tobacco: Never  Vaping Use   Vaping status: Never Used  Substance and Sexual Activity   Alcohol use: No   Drug use: No   Sexual activity: Not on file  Other Topics Concern   Not on file  Social History Narrative   Not on file   Social Drivers of Health   Tobacco Use: High Risk (12/23/2024)   Patient History    Smoking Tobacco Use: Every Day    Smokeless Tobacco Use: Never    Passive Exposure: Not on file  Financial Resource Strain: Not on file  Food Insecurity: No Food Insecurity (11/15/2024)   Epic    Worried About Programme Researcher, Broadcasting/film/video in the Last Year: Never true    Ran Out of Food in the Last Year: Never true  Transportation Needs: No Transportation Needs (11/15/2024)   Epic    Lack of Transportation (Medical): No    Lack of Transportation (Non-Medical): No  Physical Activity: Not on file  Stress: Not on file  Social Connections: Moderately Isolated (11/15/2024)   Social Connection and Isolation Panel    Frequency of Communication with Friends and Family: More than three times a week    Frequency of Social Gatherings with Friends and Family: Once a week    Attends  Religious Services: Never    Database Administrator or Organizations: Yes    Attends Engineer, Structural: More than 4 times per year    Marital Status: Divorced  Intimate Partner Violence: Not At Risk (11/15/2024)   Epic    Fear of Current or Ex-Partner: No    Emotionally Abused: No    Physically Abused: No    Sexually Abused: No  Depression (PHQ2-9): Not on file  Alcohol Screen: Not on file  Housing: Low Risk (11/15/2024)   Epic    Unable to Pay for Housing in the Last Year: No    Number of Times Moved in the  Last Year: 0    Homeless in the Last Year: No  Utilities: Not At Risk (11/15/2024)   Epic    Threatened with loss of utilities: No  Health Literacy: Not on file     Family History  Problem Relation Age of Onset   Kidney cancer Mother    Lung cancer Father     Review of Systems  Constitutional: Negative.   HENT: Negative.    Eyes: Negative.   Respiratory: Negative.    Cardiovascular: Negative.   Gastrointestinal: Negative.   Musculoskeletal: Negative.   Skin: Negative.   Neurological: Negative.   Endo/Heme/Allergies: Negative.   Psychiatric/Behavioral: Negative.        Physical Examination  Vitals:   12/23/24 1236 12/23/24 1730  BP: (!) 168/63 (!) 174/76  Pulse: 80 81  Resp: 17 16  Temp: 97.6 F (36.4 C)   SpO2: 95% 98%   Body mass index is 21.95 kg/m.  Physical Exam HENT:     Head: Normocephalic.     Nose: Nose normal.  Eyes:     Pupils: Pupils are equal, round, and reactive to light.  Neck:     Comments: Catheter in right IJ no evidence of leaking on exam, no evidence of infection Pulmonary:     Effort: Pulmonary effort is normal.  Abdominal:     General: Abdomen is flat.  Musculoskeletal:        General: Normal range of motion.     Comments: Strong right upper extremity fistula with thrill  Neurological:     General: No focal deficit present.     Mental Status: She is alert.  Psychiatric:        Mood and Affect: Mood  normal.      CBC    Component Value Date/Time   WBC 7.1 11/16/2024 0425   RBC 3.01 (L) 11/16/2024 0425   HGB 8.9 (L) 11/16/2024 0425   HGB 9.4 (L) 12/13/2021 1558   HCT 26.6 (L) 11/16/2024 0425   HCT 27.1 (L) 12/13/2021 1558   PLT 157 11/16/2024 0425   PLT 286 12/13/2021 1558   MCV 88.4 11/16/2024 0425   MCV 87 12/13/2021 1558   MCH 29.6 11/16/2024 0425   MCHC 33.5 11/16/2024 0425   RDW 17.4 (H) 11/16/2024 0425   RDW 14.3 12/13/2021 1558   LYMPHSABS 0.9 11/15/2024 1400   MONOABS 0.5 11/15/2024 1400   EOSABS 0.2 11/15/2024 1400   BASOSABS 0.0 11/15/2024 1400    BMET    Component Value Date/Time   NA 133 (L) 11/16/2024 0425   NA 145 (H) 12/13/2021 1558   K 3.9 11/16/2024 0425   CL 96 (L) 11/16/2024 0425   CO2 26 11/16/2024 0425   GLUCOSE 100 (H) 11/16/2024 0425   BUN 15 11/16/2024 0425   BUN 51 (H) 12/13/2021 1558   CREATININE 4.40 (H) 11/16/2024 0425   CALCIUM  8.1 (L) 11/16/2024 0425   GFRNONAA 10 (L) 11/16/2024 0425    COAGS: Lab Results  Component Value Date   INR 1.0 01/24/2022    ASSESSMENT/PLAN: This is a 83 y.o. female with concern for leaking tunneled dialysis catheter.  Patient does not appear to have any infection catheter not notably leaking on my exam and is clamped proximal to the leak.  As such she is okay for discharge and I will have my office reach her to schedule outpatient TDC exchange.  Her best number for phone call is [phone].   Belladonna Lubinski C. Sheree, MD [facility] Office: [phone] Pager: [phone]     [1]  Allergies Allergen Reactions   Bee Venom Anaphylaxis   Wasp Venom Anaphylaxis   Doxycycline Rash   "

## 2024-12-23 NOTE — ED Triage Notes (Signed)
 Pt coming in with her dialysis catheter clamped due to bleeding at dialysis.

## 2024-12-23 NOTE — ED Provider Notes (Signed)
 " [place] EMERGENCY DEPARTMENT AT [facility] Provider Note   CSN: [id number] Arrival date & time: 12/23/24  1230     Patient presents with: Vascular Access Problem   Monica Hunt is a 83 y.o. female ESRD presents to the ER today for evaluation of her dialysis catheter leaking.  Patient reports that she received around 2-1/2 hours of dialysis when one of her access ports is started leaking.  Clamp was placed and she was told to come into the ER.  She feels that her baseline of health.  Has not had any bleeding since the clamp was placed.  She reports that she has had the catheter in her chest for 3 years.  HPI     Prior to Admission medications  Medication Sig Start Date End Date Taking? Authorizing Provider  ACCU-CHEK SMARTVIEW test strip  01/12/20   [provider]  acetaminophen  (TYLENOL ) 500 MG tablet Take 1,000 mg by mouth every 6 (six) hours as needed for mild pain.    [provider]  albuterol (VENTOLIN HFA) 108 (90 Base) MCG/ACT inhaler Inhale 1-2 puffs into the lungs every 6 (six) hours as needed. 06/03/23   [provider]  amLODipine  (NORVASC ) 2.5 MG tablet Take 1 tablet (2.5 mg total) by mouth daily. 06/25/23   Croitoru, Mihai, MD  aspirin  EC 81 MG tablet Take 1 tablet (81 mg total) by mouth daily. Swallow whole. 11/16/24   Rashid, Farhan, MD  atorvastatin  (LIPITOR) 40 MG tablet Take 40 mg by mouth daily.    [provider]  calcitRIOL  (ROCALTROL ) 0.25 MCG capsule Take 3 capsules (0.75 mcg total) by mouth every Tuesday, Thursday, and Saturday at 6 PM. 11/18/24   Dino Antu, MD  cinacalcet  (SENSIPAR ) 30 MG tablet Take 1 tablet (30 mg total) by mouth every Tuesday, Thursday, and Saturday at 6 PM. 11/18/24   Dino Antu, MD  clopidogrel  (PLAVIX ) 75 MG tablet Take 1 tablet (75 mg total) by mouth daily. 11/17/24   Rashid, Farhan, MD  methimazole  (TAPAZOLE ) 10 MG tablet 2 tablets Monday-Saturday and 3 tablets Sunday Orally daily;  Duration: 90 days    [provider]  midodrine (PROAMATINE) 5 MG tablet Take 5 mg by mouth 3 (three) times daily as needed (if BP is low). 01/10/22   [provider]  Multiple Minerals-Vitamins (CAL-MAG-ZINC-D) TABS Take 1 tablet by mouth daily.    [provider]  Multiple Vitamin (MULTIVITAMIN WITH MINERALS) TABS tablet Take 1 tablet by mouth daily.    [provider]  nicotine  (NICODERM CQ  - DOSED IN MG/24 HOURS) 14 mg/24hr patch Place 1 patch (14 mg total) onto the skin daily. 11/17/24   Rashid, Farhan, MD  omeprazole (PRILOSEC) 40 MG capsule Take 40 mg by mouth daily.    [provider]  pantoprazole  (PROTONIX ) 40 MG tablet Take 1 tablet (40 mg total) by mouth daily. 11/17/24   Rashid, Farhan, MD  risedronate (ACTONEL) 35 MG tablet Take 35 mg by mouth every 14 (fourteen) days. Every other Thursday    [provider]  rOPINIRole  (REQUIP ) 3 MG tablet Take 3 mg by mouth at bedtime. Take 1 tab 1 to 3 hours before bedtime    [provider]  sevelamer  carbonate (RENVELA ) 800 MG tablet Take 1 tablet (800 mg total) by mouth 3 (three) times daily with meals. 11/16/24   Rashid, Farhan, MD  traZODone  (DESYREL ) 50 MG tablet Take 100 mg by mouth at bedtime.    [provider]  Allergies: Bee venom, Wasp venom, and Doxycycline    Review of Systems  Constitutional:  Negative for chills and fever.    Updated Vital Signs BP (!) 168/63   Pulse 80   Temp 97.6 F (36.4 C)   Resp 17   Ht 5' 2 (1.575 m)   Wt 54.4 kg   SpO2 95%   BMI 21.95 kg/m   Physical Exam Vitals and nursing note reviewed.  Constitutional:      General: She is not in acute distress.    Appearance: She is not ill-appearing or toxic-appearing.  Eyes:     General: No scleral icterus. Cardiovascular:     Rate and Rhythm: Normal rate.  Pulmonary:     Effort: Pulmonary effort is normal. No respiratory distress.     Comments: Dialysis catheter present in  the right upper chest.  Clamp is present on one of the access ports.  Nontender to palpation of the surrounding area. Skin:    General: Skin is warm and dry.  Neurological:     Mental Status: She is alert.     Gait: Gait normal.     (all labs ordered are listed, but only abnormal results are displayed) Labs Reviewed - No data to display  EKG: None  Radiology: No results found.  Procedures   Medications Ordered in the ED - No data to display  Medical Decision Making  83 y.o. female presents to the ER today for evaluation of dialysis catheter malfunction. Differential diagnosis includes but is not limited to device malfunction, user error. Vital signs elevated BP otherwise unremarkable. Physical exam as noted above.   Patient reports that she has had the dialysis catheter present for 3 years.  Has not had this issue before.  She does not report who put the catheter in.  I do see that she has seen Dr. Gari before and he is on-call for vascular surgery.  Will consult him.  Catheter is currently not leaking.  No surrounding tenderness.  She does not appear in any acute distress.  One of her lines is currently clamped.  I consulted vascular surgery and spoke with Dr. Sheree. He will come down to evaluate the patient.  Dr. Sheree evaluated the patient. He will schedule her for exchange tomorrow outpatient. Information was confirmed. Patient agreeable to plan.   We discussed plan at bedside. We discussed strict return precautions and red flag symptoms. The patient verbalized their understanding and agrees to the plan. The patient is stable and being discharged home in good condition.  Portions of this report may have been transcribed using voice recognition software. Every effort was made to ensure accuracy; however, inadvertent computerized transcription errors may be present.    Final diagnoses:  ESRD (end stage renal disease) (HCC)  Leakage of vascular dialysis catheter, initial  encounter    ED Discharge Orders     None          Bernis Ernst, [place] 12/23/24 2140  "

## 2024-12-24 ENCOUNTER — Other Ambulatory Visit: Payer: Self-pay

## 2024-12-24 ENCOUNTER — Ambulatory Visit (HOSPITAL_COMMUNITY)
Admission: RE | Disposition: A | Discharge status: Home / Self Care | Payer: Self-pay | Source: Ambulatory Visit | Attending: Vascular Surgery

## 2024-12-24 ENCOUNTER — Ambulatory Visit (HOSPITAL_COMMUNITY)
Admission: RE | Admit: 2024-12-24 | Discharge: 2024-12-24 | Disposition: A | Discharge status: Home / Self Care | Source: Ambulatory Visit | Attending: Vascular Surgery | Admitting: Vascular Surgery

## 2024-12-24 ENCOUNTER — Encounter (HOSPITAL_COMMUNITY): Payer: Self-pay | Admitting: Vascular Surgery

## 2024-12-24 DIAGNOSIS — F17210 Nicotine dependence, cigarettes, uncomplicated: Secondary | ICD-10-CM | POA: Insufficient documentation

## 2024-12-24 DIAGNOSIS — N186 End stage renal disease: Secondary | ICD-10-CM | POA: Diagnosis not present

## 2024-12-24 DIAGNOSIS — E1122 Type 2 diabetes mellitus with diabetic chronic kidney disease: Secondary | ICD-10-CM | POA: Insufficient documentation

## 2024-12-24 DIAGNOSIS — Z992 Dependence on renal dialysis: Secondary | ICD-10-CM | POA: Diagnosis not present

## 2024-12-24 DIAGNOSIS — T8243XA Leakage of vascular dialysis catheter, initial encounter: Secondary | ICD-10-CM

## 2024-12-24 DIAGNOSIS — I120 Hypertensive chronic kidney disease with stage 5 chronic kidney disease or end stage renal disease: Secondary | ICD-10-CM | POA: Diagnosis not present

## 2024-12-24 HISTORY — PX: DIALYSIS/PERMA CATHETER REPAIR: CATH118293

## 2024-12-24 LAB — GLUCOSE, CAPILLARY: Glucose-Capillary: 104 mg/dL — ABNORMAL HIGH (ref 70–99)

## 2024-12-24 SURGERY — DIALYSIS/PERMA CATHETER REPAIR
Anesthesia: LOCAL | Site: Chest | Laterality: Right

## 2024-12-24 MED ORDER — HEPARIN SODIUM (PORCINE) 1000 UNIT/ML IJ SOLN
INTRAMUSCULAR | Status: AC
  Filled 2024-12-24: qty 10

## 2024-12-24 MED ORDER — LIDOCAINE HCL (PF) 1 % IJ SOLN
INTRAMUSCULAR | Status: AC
  Filled 2024-12-24: qty 30

## 2024-12-24 MED ORDER — HEPARIN SODIUM (PORCINE) 1000 UNIT/ML IJ SOLN
INTRAMUSCULAR | Status: DC | PRN
  Administered 2024-12-24 (×2): 1600 [IU] via INTRAVENOUS

## 2024-12-24 MED ORDER — HEPARIN (PORCINE) IN NACL 1000-0.9 UT/500ML-% IV SOLN
INTRAVENOUS | Status: DC | PRN
  Administered 2024-12-24: 500 mL

## 2024-12-24 SURGICAL SUPPLY — 2 items
KIT REPAIR CATH HEMODIALYSIS (CATHETERS) IMPLANT
TRAY PV CATH (CUSTOM PROCEDURE TRAY) ×1 IMPLANT

## 2024-12-24 NOTE — H&P (Signed)
" °  HD ACCESS CENTER H&P   Patient ID: Monica Hunt, female   DOB: 24-Aug-1941, 83 y.o.   MRN: [id number]  Subjective:     HPI Monica Hunt is a 83 y.o. female with ESRD presenting for evaluation of HD access and consideration of intervention. Referred by: Emergency department Having issues with leaking tubing from the dialysis catheter, clamp in place  Past Medical History:  Diagnosis Date   Anemia    Cataracts, bilateral    Chronic kidney disease    ESRD  Dialysis M/W/F   Depression    Diabetes mellitus without complication (HCC)    GERD (gastroesophageal reflux disease)    Hypertension    Hyperthyroidism    Pneumonia    Stroke (HCC)    symptom onset ~ 01/10/22, diagnosed 01/24/22 acute/subacute bilateral cerebral infarcts, largest left frontal lobe, extensive punctate chronic microhemorrages concerning for cerbral amyloid angiopathy   Family History  Problem Relation Age of Onset   Kidney cancer Mother    Lung cancer Father    Past Surgical History:  Procedure Laterality Date   ABDOMINAL HYSTERECTOMY     vaginal   AV FISTULA PLACEMENT Right 05/02/2022   Procedure: RIGHT ARTERIOVENOUS (AV) FISTULA  CREATION;  Surgeon: Sheree Penne Bruckner, MD;  Location: [facility] OR;  Service: Vascular;  Laterality: Right;   CARPAL TUNNEL RELEASE Right    COLONOSCOPY WITH PROPOFOL  N/A 11/17/2014   Procedure: COLONOSCOPY WITH PROPOFOL ;  Surgeon: Gladis MARLA Louder, MD;  Location: [facility] ENDOSCOPY;  Service: Endoscopy;  Laterality: N/A;   LOOP RECORDER INSERTION N/A 01/25/2022   Procedure: LOOP RECORDER INSERTION;  Surgeon: Inocencio Soyla Gladis, MD;  Location: [facility] INVASIVE CV LAB;  Service: Cardiovascular;  Laterality: N/A;    Short Social History:  Social History   Tobacco Use   Smoking status: Every Day    Current packs/day: 0.25    Average packs/day: 0.3 packs/day for 45.0 years (11.3 ttl pk-yrs)    Types: Cigarettes   Smokeless tobacco: Never  Substance Use Topics   Alcohol use: No     Allergies[1]  No current facility-administered medications for this encounter.    REVIEW OF SYSTEMS All other systems were reviewed and are negative     Objective:   Objective   Vitals:   12/24/24 0747 12/24/24 0804  BP: (!) 149/76 (!) 138/117  Pulse: 81 74  Resp: 12 14  Temp: 97.6 F (36.4 C)   TempSrc: Oral   SpO2: (!) 88% 100%   There is no height or weight on file to calculate BMI.  Physical Exam General: no acute distress Cardiac: hemodynamically stable     Assessment/Plan:   Monica Hunt is a 83 y.o. female with ESRD and a poorly functioning HD catheter, the tubing from one of the hubs is leaking. This catheter has been in for about 3 years and has not had any issues therefore I will plan to attempt to use the catheter repair kit first in order to repair the broken tubing.  If this is not possible then we will proceed with tunneled dialysis catheter replacement. Risks and benefits were reviewed and she elected to proceed.   Norman Serve, MD [facility]      [1]  Allergies Allergen Reactions   Bee Venom Anaphylaxis   Wasp Venom Anaphylaxis   Doxycycline Rash   "

## 2024-12-24 NOTE — Op Note (Signed)
" ° ° °  Patient name: Monica Hunt MRN: [id number] DOB: 07/12/1941 Sex: female  12/24/2024 Pre-operative Diagnosis: ESRD on HD Post-operative diagnosis:  Same Surgeon:  Norman GORMAN Serve, MD Procedure Performed:  Outpatient evaluation, level 3 Repair of dialysis permacath.  [address]24  Indications: Ms. Chavous is an 83 year old female with ESRD on HD who presented to the emergency department yesterday due to leakage of the blue hub from her right IJ TDC.  This was clamped and she was planned for a repair versus TDC exchange today in the HD access center.  I explained to her that since her HD catheter had been working for almost 3 years I would rather attempt to repair the line rather than exchange if possible.  Risks and benefits were reviewed and she elected to proceed.  Findings:  The catheter was unclamped and there was some slight leakage from the blue hub.  This was reclamped as close as possible to the Y connector and to the tubing was transected.  The new blue tubing and the repair kit was then placed and the screw connector secured. There was no leakage after the repair was completed   Procedure:  The patient was identified in the holding area and taken to the cath lab  The patient was then placed supine on the table and prepped and draped in the usual sterile fashion.  The catheter was unclamped and there was some leakage from the blue hub.  This was reclamped close possible Y connector and the tubing was transected with scissors.  The tubing was then cleaned with alcohol swab and the new blue tubing from the repair kit was placed onto the cut and of the old catheter.  The screw on connector was secured.  The blue tubing was then unclamped and aspirated and flushed with ease.  The line was then heparin  locked and new caps were placed.  Contrast: None Sedation: None  Impression: Successful repair of existing right IJ TDC   Norman GORMAN Serve MD [facility] [place] Office:  [phone]  "

## 2024-12-29 ENCOUNTER — Encounter

## 2024-12-29 MED FILL — Lidocaine HCl Local Preservative Free (PF) Inj 1%: INTRAMUSCULAR | Qty: 30 | Status: AC

## 2024-12-30 ENCOUNTER — Ambulatory Visit

## 2024-12-30 DIAGNOSIS — I5032 Chronic diastolic (congestive) heart failure: Secondary | ICD-10-CM

## 2024-12-30 LAB — CUP PACEART REMOTE DEVICE CHECK
Date Time Interrogation Session: 20260106000742
Implantable Pulse Generator Implant Date: 20230201

## 2024-12-31 ENCOUNTER — Ambulatory Visit: Payer: Self-pay | Admitting: Cardiology

## 2024-12-31 NOTE — Progress Notes (Signed)
 Remote Loop Recorder Transmission

## 2025-01-29 ENCOUNTER — Encounter

## 2025-01-30 ENCOUNTER — Ambulatory Visit

## 2025-01-30 LAB — CUP PACEART REMOTE DEVICE CHECK
Date Time Interrogation Session: 20260206000412
Implantable Pulse Generator Implant Date: 20230201

## 2025-03-02 ENCOUNTER — Encounter

## 2025-03-02 ENCOUNTER — Ambulatory Visit

## 2025-04-02 ENCOUNTER — Encounter

## 2025-05-04 ENCOUNTER — Encounter
